# Patient Record
Sex: Female | Born: 1965 | Race: White | Hispanic: No | Smoking: Current every day smoker
Health system: Southern US, Community
[De-identification: ages and names within clinical notes are randomized; demographics above are authoritative.]

## PROBLEM LIST (undated history)

## (undated) DIAGNOSIS — M797 Fibromyalgia: Secondary | ICD-10-CM

## (undated) DIAGNOSIS — E559 Vitamin D deficiency, unspecified: Secondary | ICD-10-CM

## (undated) DIAGNOSIS — E785 Hyperlipidemia, unspecified: Secondary | ICD-10-CM

## (undated) DIAGNOSIS — F419 Anxiety disorder, unspecified: Secondary | ICD-10-CM

## (undated) DIAGNOSIS — Z720 Tobacco use: Secondary | ICD-10-CM

## (undated) DIAGNOSIS — E2839 Other primary ovarian failure: Secondary | ICD-10-CM

## (undated) DIAGNOSIS — J45909 Unspecified asthma, uncomplicated: Secondary | ICD-10-CM

## (undated) DIAGNOSIS — F1010 Alcohol abuse, uncomplicated: Secondary | ICD-10-CM

## (undated) DIAGNOSIS — R51 Headache: Secondary | ICD-10-CM

## (undated) DIAGNOSIS — I10 Essential (primary) hypertension: Secondary | ICD-10-CM

## (undated) DIAGNOSIS — R29898 Other symptoms and signs involving the musculoskeletal system: Secondary | ICD-10-CM

## (undated) DIAGNOSIS — G43909 Migraine, unspecified, not intractable, without status migrainosus: Secondary | ICD-10-CM

## (undated) DIAGNOSIS — J449 Chronic obstructive pulmonary disease, unspecified: Secondary | ICD-10-CM

## (undated) DIAGNOSIS — E876 Hypokalemia: Secondary | ICD-10-CM

## (undated) DIAGNOSIS — S161XXA Strain of muscle, fascia and tendon at neck level, initial encounter: Secondary | ICD-10-CM

## (undated) DIAGNOSIS — I639 Cerebral infarction, unspecified: Secondary | ICD-10-CM

## (undated) DIAGNOSIS — Q211 Atrial septal defect: Secondary | ICD-10-CM

## (undated) DIAGNOSIS — F1590 Other stimulant use, unspecified, uncomplicated: Secondary | ICD-10-CM

## (undated) DIAGNOSIS — D510 Vitamin B12 deficiency anemia due to intrinsic factor deficiency: Secondary | ICD-10-CM

## (undated) DIAGNOSIS — K219 Gastro-esophageal reflux disease without esophagitis: Secondary | ICD-10-CM

## (undated) DIAGNOSIS — I5042 Chronic combined systolic (congestive) and diastolic (congestive) heart failure: Secondary | ICD-10-CM

## (undated) DIAGNOSIS — E538 Deficiency of other specified B group vitamins: Secondary | ICD-10-CM

## (undated) HISTORY — DX: Unspecified asthma, uncomplicated: J45.909

## (undated) HISTORY — DX: Other primary ovarian failure: E28.39

## (undated) HISTORY — DX: Anxiety disorder, unspecified: F41.9

## (undated) HISTORY — DX: Essential (primary) hypertension: I10

## (undated) HISTORY — DX: Deficiency of other specified B group vitamins: E53.8

## (undated) HISTORY — DX: Tobacco use: Z72.0

## (undated) HISTORY — DX: Gastro-esophageal reflux disease without esophagitis: K21.9

## (undated) HISTORY — DX: Other stimulant use, unspecified, uncomplicated: F15.90

## (undated) HISTORY — DX: Fibromyalgia: M79.7

## (undated) HISTORY — DX: Vitamin D deficiency, unspecified: E55.9

## (undated) HISTORY — DX: Chronic obstructive pulmonary disease, unspecified: J44.9

## (undated) HISTORY — DX: Cerebral infarction, unspecified: I63.9

## (undated) HISTORY — DX: Alcohol abuse, uncomplicated: F10.10

## (undated) HISTORY — DX: Hyperlipidemia, unspecified: E78.5

## (undated) HISTORY — DX: Hypokalemia: E87.6

## (undated) HISTORY — DX: Migraine, unspecified, not intractable, without status migrainosus: G43.909

## (undated) HISTORY — PX: APPENDECTOMY: SHX54

## (undated) HISTORY — DX: Headache: R51

## (undated) HISTORY — DX: Vitamin B12 deficiency anemia due to intrinsic factor deficiency: D51.0

## (undated) HISTORY — PX: KNEE SURGERY: SHX244

## (undated) HISTORY — DX: Other symptoms and signs involving the musculoskeletal system: R29.898

## (undated) HISTORY — DX: Hypomagnesemia: E83.42

## (undated) HISTORY — PX: TUBAL LIGATION: SHX77

## (undated) HISTORY — DX: Strain of muscle, fascia and tendon at neck level, initial encounter: S16.1XXA

---
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
## 1997-12-24 ENCOUNTER — Other Ambulatory Visit: Admission: RE | Admit: 1997-12-24 | Discharge: 1997-12-24 | Payer: Self-pay | Admitting: Obstetrics and Gynecology

## 2001-01-05 ENCOUNTER — Other Ambulatory Visit: Admission: RE | Admit: 2001-01-05 | Discharge: 2001-01-05 | Payer: Self-pay | Admitting: Obstetrics and Gynecology

## 2001-01-17 ENCOUNTER — Other Ambulatory Visit (HOSPITAL_COMMUNITY): Admission: RE | Admit: 2001-01-17 | Discharge: 2001-01-27 | Payer: Self-pay | Admitting: Psychiatry

## 2002-04-11 ENCOUNTER — Other Ambulatory Visit: Admission: RE | Admit: 2002-04-11 | Discharge: 2002-04-11 | Payer: Self-pay | Admitting: Obstetrics and Gynecology

## 2002-08-22 ENCOUNTER — Ambulatory Visit (HOSPITAL_COMMUNITY): Admission: RE | Admit: 2002-08-22 | Discharge: 2002-08-22 | Payer: Self-pay | Admitting: Obstetrics and Gynecology

## 2004-04-04 ENCOUNTER — Other Ambulatory Visit: Admission: RE | Admit: 2004-04-04 | Discharge: 2004-04-04 | Payer: Self-pay | Admitting: Obstetrics and Gynecology

## 2011-05-29 ENCOUNTER — Encounter: Payer: Self-pay | Admitting: Gastroenterology

## 2011-06-19 ENCOUNTER — Ambulatory Visit: Payer: Self-pay | Admitting: Gastroenterology

## 2011-06-30 ENCOUNTER — Encounter: Payer: Self-pay | Admitting: Gastroenterology

## 2011-07-01 ENCOUNTER — Ambulatory Visit: Payer: Self-pay | Admitting: Gastroenterology

## 2011-08-07 ENCOUNTER — Encounter: Payer: Self-pay | Admitting: Gastroenterology

## 2011-08-07 ENCOUNTER — Ambulatory Visit (INDEPENDENT_AMBULATORY_CARE_PROVIDER_SITE_OTHER): Payer: PRIVATE HEALTH INSURANCE | Admitting: Gastroenterology

## 2011-08-07 ENCOUNTER — Other Ambulatory Visit (INDEPENDENT_AMBULATORY_CARE_PROVIDER_SITE_OTHER): Payer: PRIVATE HEALTH INSURANCE

## 2011-08-07 ENCOUNTER — Ambulatory Visit: Payer: PRIVATE HEALTH INSURANCE | Admitting: Gastroenterology

## 2011-08-07 ENCOUNTER — Telehealth: Payer: Self-pay

## 2011-08-07 VITALS — BP 110/60 | HR 68 | Ht 62.0 in | Wt 127.0 lb

## 2011-08-07 DIAGNOSIS — K219 Gastro-esophageal reflux disease without esophagitis: Secondary | ICD-10-CM

## 2011-08-07 DIAGNOSIS — K625 Hemorrhage of anus and rectum: Secondary | ICD-10-CM

## 2011-08-07 DIAGNOSIS — R110 Nausea: Secondary | ICD-10-CM

## 2011-08-07 LAB — CBC WITH DIFFERENTIAL/PLATELET
Eosinophils Absolute: 0.1 10*3/uL (ref 0.0–0.7)
Eosinophils Relative: 1.7 % (ref 0.0–5.0)
HCT: 40.5 % (ref 36.0–46.0)
Lymphs Abs: 2.5 10*3/uL (ref 0.7–4.0)
MCHC: 33.3 g/dL (ref 30.0–36.0)
MCV: 96.1 fl (ref 78.0–100.0)
Monocytes Absolute: 0.6 10*3/uL (ref 0.1–1.0)
Neutrophils Relative %: 56.8 % (ref 43.0–77.0)
Platelets: 295 10*3/uL (ref 150.0–400.0)

## 2011-08-07 LAB — COMPREHENSIVE METABOLIC PANEL
AST: 47 U/L — ABNORMAL HIGH (ref 0–37)
Albumin: 3.8 g/dL (ref 3.5–5.2)
Alkaline Phosphatase: 85 U/L (ref 39–117)
BUN: 14 mg/dL (ref 6–23)
Glucose, Bld: 95 mg/dL (ref 70–99)
Potassium: 3.8 mEq/L (ref 3.5–5.1)
Sodium: 139 mEq/L (ref 135–145)
Total Bilirubin: 0.3 mg/dL (ref 0.3–1.2)

## 2011-08-07 MED ORDER — MOVIPREP 100 G PO SOLR: 1.0000 | ORAL | Status: DC

## 2011-08-07 MED ORDER — ESOMEPRAZOLE MAGNESIUM 40 MG PO PACK: 40.0000 mg | PACK | Freq: Every day | ORAL | Status: DC

## 2011-08-07 MED ORDER — PROMETHAZINE HCL 50 MG PO TABS: 50.0000 mg | ORAL_TABLET | Freq: Two times a day (BID) | ORAL | Status: DC

## 2011-08-07 NOTE — Telephone Encounter (Signed)
Pt aware medication sent 

## 2011-08-07 NOTE — Telephone Encounter (Signed)
Ok, phenergan 25mg  pills, disp 50, take one twice daily as needed for nausea, refills 2 thanks

## 2011-08-07 NOTE — Patient Instructions (Addendum)
One of your biggest health concerns is your smoking.  This increases your risk for most cancers and serious cardiovascular diseases such as strokes, heart attacks.  You should try your best to stop.  If you need assistance, please contact your PCP or Smoking Cessation Class at [facility] [phone]) or [facility]uit-Line (1-800-QUIT-NOW). We will get records from Dr. Charm Barges colonoscopy/egd in 2006 (any pathology as wel). Samples of PPI given.  Take one pill 20-30 min prior to a meal. Take one pepcid/zantac at bedtime (this will also help with acid prolems). You will be set up for a colonoscopy ([facility] propofol for bleeding You will be set up for an upper endoscopy ([facility] propofol for GERD, nausea GERD handout. Try to avoid caffeine as much as possible. You will have labs checked today in the basement lab.  Please head down after you check out with the front desk  (cbc, cmet).  Gastroesophageal Reflux Disease, Adult Gastroesophageal reflux disease (GERD) happens when acid from your stomach flows up into the esophagus. When acid comes in contact with the esophagus, the acid causes soreness (inflammation) in the esophagus. Over time, GERD may create small holes (ulcers) in the lining of the esophagus. CAUSES   Increased body weight. This puts pressure on the stomach, making acid rise from the stomach into the esophagus.   Smoking. This increases acid production in the stomach.   Drinking alcohol. This causes decreased pressure in the lower esophageal sphincter (valve or ring of muscle between the esophagus and stomach), allowing acid from the stomach into the esophagus.   Late evening meals and a full stomach. This increases pressure and acid production in the stomach.   A malformed lower esophageal sphincter.  Sometimes, no cause is found. SYMPTOMS   Burning pain in the lower part of the mid-chest behind the breastbone and in the mid-stomach area. This may occur twice a week or more often.    Trouble swallowing.   Sore throat.   Dry cough.   Asthma-like symptoms including chest tightness, shortness of breath, or wheezing.  DIAGNOSIS  Your caregiver may be able to diagnose GERD based on your symptoms. In some cases, X-rays and other tests may be done to check for complications or to check the condition of your stomach and esophagus. TREATMENT  Your caregiver may recommend over-the-counter or prescription medicines to help decrease acid production. Ask your caregiver before starting or adding any new medicines.  HOME CARE INSTRUCTIONS   Change the factors that you can control. Ask your caregiver for guidance concerning weight loss, quitting smoking, and alcohol consumption.   Avoid foods and drinks that make your symptoms worse, such as:   Caffeine or alcoholic drinks.   Chocolate.   Peppermint or mint flavorings.   Garlic and onions.   Spicy foods.   Citrus fruits, such as oranges, lemons, or limes.   Tomato-based foods such as sauce, chili, salsa, and pizza.   Fried and fatty foods.   Avoid lying down for the 3 hours prior to your bedtime or prior to taking a nap.   Eat small, frequent meals instead of large meals.   Wear loose-fitting clothing. Do not wear anything tight around your waist that causes pressure on your stomach.   Raise the head of your bed 6 to 8 inches with wood blocks to help you sleep. Extra pillows will not help.   Only take over-the-counter or prescription medicines for pain, discomfort, or fever as directed by your caregiver.   Do  not take aspirin, ibuprofen, or other nonsteroidal anti-inflammatory drugs (NSAIDs).  SEEK IMMEDIATE MEDICAL CARE IF:   You have pain in your arms, neck, jaw, teeth, or back.   Your pain increases or changes in intensity or duration.   You develop nausea, vomiting, or sweating (diaphoresis).   You develop shortness of breath, or you faint.   Your vomit is green, yellow, black, or looks like coffee  grounds or blood.   Your stool is red, bloody, or black.  These symptoms could be signs of other problems, such as heart disease, gastric bleeding, or esophageal bleeding. MAKE SURE YOU:   Understand these instructions.   Will watch your condition.   Will get help right away if you are not doing well or get worse.  Document Released: 11/12/2004 Document Revised: 01/22/2011 Document Reviewed: 08/22/2010 [facility] Patient Information 2012 [place], [place].

## 2011-08-07 NOTE — Progress Notes (Signed)
HPI: This is a   pleasant 46 year old woman whom I am meeting for the first time today.he has constant nausea, she has abd pains.  Mid abdomen.  crampy constant pains.  Moving her bowels does not help. Predominantly loose bowel but definitely alternates.   She says she's had colitis 3 times in past year.  Presented with bleeding overt, she had urgency.  This occurred in January and then also twice since then.  She had colonoscoy in 2006 (Dr. Charm Barges), she believes polyps were removed.  He also performed EGD, she does not recall what was found.  Has lost 22 pounds in past 5-6 month.  Not trying to lose weight.  Takes goodys rarely.  Some recently for headaches.  Takes dramamine for nausea  She used to drink a lot, got 2 dui's.  Has had no etoh in about 2 months.  Drinks a lot of soda, drinks coke for pyrosis.  Has been on PPI for years.  Most recently omeprazole, usually takes it before she sleeps in AM (night shifts).  Has a pretty poor appetite.   Review of systems: Pertinent positive and negative review of systems were noted in the above HPI section. Complete review of systems was performed and was otherwise normal.    Past Medical History  Diagnosis Date  . Alcohol abuse   . Anxiety   . Asthma   . COPD (chronic obstructive pulmonary disease)   . Fibromyalgia   . Colitis, infectious     Past Surgical History  Procedure Date  . Tubal ligation     Current Outpatient Prescriptions  Medication Sig Dispense Refill  . alprazolam (XANAX) 2 MG tablet Take 2 mg by mouth at bedtime as needed.      Marland Kitchen omeprazole (PRILOSEC) 20 MG capsule Take 20 mg by mouth daily.        Allergies as of 08/07/2011 - Review Complete 08/07/2011  Allergen Reaction Noted  . Codeine  08/07/2011  . Sulfa antibiotics  08/07/2011    Family History  Problem Relation Age of Onset  . Colitis Paternal Aunt   . Ovarian cancer Maternal Grandmother     History   Social History  . Marital Status:  Divorced    Spouse Name: N/A    Number of Children: 2  . Years of Education: N/A   Occupational History  . Textiles    Social History Main Topics  . Smoking status: Current Everyday Smoker  . Smokeless tobacco: Never Used  . Alcohol Use: No  . Drug Use: No  . Sexually Active: Not on file   Other Topics Concern  . Not on file   Social History Narrative  . No narrative on file       Physical Exam: BP 110/60  Pulse 68  Ht 5\' 2"  (1.575 m)  Wt 127 lb (57.607 kg)  BMI 23.23 kg/m2 Constitutional: generally well-appearing Psychiatric: alert and oriented x3 Eyes: extraocular movements intact Mouth: oral pharynx moist, no lesions Neck: supple no lymphadenopathy Cardiovascular: heart regular rate and rhythm Lungs: clear to auscultation bilaterally Abdomen: soft, nontender, nondistended, no obvious ascites, no peritoneal signs, normal bowel sounds Extremities: no lower extremity edema bilaterally Skin: no lesions on visible extremities    Assessment and plan: 46 y.o. female with GERD, intermittent episodes of rectal bleeding, alternating bowel habits, nausea dyspepsia  she has myriad upper and lower GI symptoms.  We will get records from her previous gastroenterologist including colonoscopy, upper endoscopy reports from 2007. I think she  will still need repeat upper and lower endoscopy now given her intermittent rectal bleeding, her constant nausea. She use to be an alcoholic but has cut back to almost 0 alcohol. She does not take proton pump inhibitor at the current time in relation to meals and I had changed that for her. I'm also adding H2 blocker at night. He drinks a fair amount of caffeine, sometimes she uses to treat heartburn which is exactly the wrong thing to do since caffeine can worsen heartburn. She'll get a set of labs including CBC, complete metabolic profile and celiac sprue testing.

## 2011-08-07 NOTE — Telephone Encounter (Signed)
Pt would like phenergan for nausea.  Can we send?

## 2011-08-10 ENCOUNTER — Encounter: Payer: Self-pay | Admitting: Gastroenterology

## 2011-08-10 LAB — CELIAC PANEL 10
Endomysial Screen: NEGATIVE
Gliadin IgG: 11.2 U/mL (ref <20)
IgA: 204 mg/dL (ref 69–380)
Tissue Transglutaminase Ab, IgA: 4.6 U/mL (ref <20)

## 2011-08-11 ENCOUNTER — Encounter: Payer: Self-pay | Admitting: Gastroenterology

## 2011-08-12 ENCOUNTER — Other Ambulatory Visit: Payer: Self-pay

## 2011-08-25 ENCOUNTER — Telehealth: Payer: Self-pay | Admitting: Gastroenterology

## 2011-08-25 MED ORDER — ESOMEPRAZOLE MAGNESIUM 40 MG PO PACK: 40.0000 mg | PACK | Freq: Every day | ORAL | Status: DC

## 2011-08-25 NOTE — Telephone Encounter (Signed)
nexium prescription sent to the pharmacy

## 2011-08-27 ENCOUNTER — Other Ambulatory Visit: Payer: Self-pay | Admitting: Gastroenterology

## 2011-08-27 MED ORDER — ESOMEPRAZOLE MAGNESIUM 40 MG PO PACK: 40.0000 mg | PACK | Freq: Every day | ORAL | Status: DC

## 2011-08-27 NOTE — Telephone Encounter (Signed)
Pt aware prescription sent

## 2011-08-28 ENCOUNTER — Other Ambulatory Visit: Payer: Self-pay

## 2011-08-28 MED ORDER — ESOMEPRAZOLE MAGNESIUM 40 MG PO PACK: 40.0000 mg | PACK | Freq: Every day | ORAL | Status: DC

## 2011-08-31 ENCOUNTER — Telehealth: Payer: Self-pay | Admitting: Gastroenterology

## 2011-09-01 NOTE — Telephone Encounter (Signed)
Prior Berkley Harvey form has been sent to [facility].

## 2011-09-11 ENCOUNTER — Encounter: Payer: PRIVATE HEALTH INSURANCE | Admitting: Gastroenterology

## 2011-09-15 DIAGNOSIS — F410 Panic disorder [episodic paroxysmal anxiety] without agoraphobia: Secondary | ICD-10-CM

## 2011-09-15 DIAGNOSIS — F329 Major depressive disorder, single episode, unspecified: Secondary | ICD-10-CM | POA: Insufficient documentation

## 2011-09-15 HISTORY — DX: Panic disorder (episodic paroxysmal anxiety): F41.0

## 2011-09-15 HISTORY — DX: Major depressive disorder, single episode, unspecified: F32.9

## 2011-10-01 ENCOUNTER — Telehealth: Payer: Self-pay | Admitting: Gastroenterology

## 2011-10-01 NOTE — Telephone Encounter (Signed)
Patient advised that Mary Franco will return her call tomorrow to find a propofol day.

## 2011-10-02 NOTE — Telephone Encounter (Signed)
Pt is checking her schedule at work and will call back to reschedule

## 2011-10-06 ENCOUNTER — Encounter: Payer: PRIVATE HEALTH INSURANCE | Admitting: Gastroenterology

## 2011-11-12 ENCOUNTER — Telehealth: Payer: Self-pay

## 2011-11-12 NOTE — Telephone Encounter (Signed)
Message copied by Donata Duff on Thu Nov 12, 2011  8:25 AM ------      Message from: Donata Duff      Created: Wed Aug 12, 2011  9:50 AM       Pt to get labs

## 2011-11-12 NOTE — Telephone Encounter (Signed)
Pt has been notified to have labs  

## 2012-02-11 ENCOUNTER — Other Ambulatory Visit (INDEPENDENT_AMBULATORY_CARE_PROVIDER_SITE_OTHER): Payer: PRIVATE HEALTH INSURANCE

## 2012-02-11 ENCOUNTER — Encounter: Payer: Self-pay | Admitting: Gastroenterology

## 2012-02-11 ENCOUNTER — Ambulatory Visit (INDEPENDENT_AMBULATORY_CARE_PROVIDER_SITE_OTHER): Payer: PRIVATE HEALTH INSURANCE | Admitting: Gastroenterology

## 2012-02-11 ENCOUNTER — Other Ambulatory Visit: Payer: Self-pay | Admitting: Gastroenterology

## 2012-02-11 VITALS — BP 108/72 | HR 88 | Ht 62.0 in | Wt 130.0 lb

## 2012-02-11 DIAGNOSIS — R110 Nausea: Secondary | ICD-10-CM

## 2012-02-11 DIAGNOSIS — R197 Diarrhea, unspecified: Secondary | ICD-10-CM

## 2012-02-11 LAB — CBC WITH DIFFERENTIAL/PLATELET
Basophils Absolute: 0 10*3/uL (ref 0.0–0.1)
Eosinophils Absolute: 0.1 10*3/uL (ref 0.0–0.7)
Lymphocytes Relative: 29.4 % (ref 12.0–46.0)
MCHC: 34.2 g/dL (ref 30.0–36.0)
Neutrophils Relative %: 63.9 % (ref 43.0–77.0)
Platelets: 286 10*3/uL (ref 150.0–400.0)
RBC: 4.09 Mil/uL (ref 3.87–5.11)
RDW: 12.9 % (ref 11.5–14.6)

## 2012-02-11 LAB — HEPATIC FUNCTION PANEL
ALT: 23 U/L (ref 0–35)
AST: 26 U/L (ref 0–37)
Bilirubin, Direct: 0.1 mg/dL (ref 0.0–0.3)
Total Bilirubin: 0.4 mg/dL (ref 0.3–1.2)
Total Protein: 6.8 g/dL (ref 6.0–8.3)

## 2012-02-11 MED ORDER — PEG-KCL-NACL-NASULF-NA ASC-C 100 G PO SOLR: 1.0000 | Freq: Once | ORAL | Status: DC

## 2012-02-11 NOTE — Telephone Encounter (Signed)
Dr Christella Hartigan can we refill?

## 2012-02-11 NOTE — Patient Instructions (Addendum)
One of your biggest health concerns is your smoking.  This increases your risk for most cancers and serious cardiovascular diseases such as strokes, heart attacks.  You should try your best to stop.  If you need assistance, please contact your PCP or Smoking Cessation Class at [facility] [phone]) or [facility]Line (1-800-QUIT-NOW). We will again try to get copies of your previous colonoscopy/EGD from Dr. Charm Barges in [place].  Also any associated pathology reports. Your previous liver tests were elevated, need to repeat LFTs (labs) today. Also cbc for fatigue. You will be set up for a colonoscopy, upper endoscopy (for loose stools, nausea, history of rectal bleeding), [facility] or [facility] MAC+. Goody powders can cause serious damage to your gi tract, should try to avoid as best as possible.

## 2012-02-11 NOTE — Progress Notes (Signed)
Review of pertinent gastrointestinal problems: 1. "constant nausea, intermittent rectal bleeding" seen by Dr. Christella Hartigan as new patient 07/2011, recommended she have colonoscopy/EGD but she cancelled those appts.  Labs 07/2011 cbc, celiac panel normal; AST slightly elevated, recommended to have repeat lfts but she never did.  She had colonoscoy in 2006 (Dr. Charm Barges), she believes polyps were removed. He also performed EGD, she does not recall what was found. We tried 2013 to get those records but they were never sent. 2. Former etoh abuser with 2 dui's   HPI: This is a  pleasant 46 year old woman whom I last saw about 6 months ago. She has not followed up with her colonoscopy or upper endoscopy which I recommended. She is also not follow up with the repeat liver tests check that I recommended.  Weight is up 3 pounds since last visit.  Still having a lot of abdominal pain. Eating has not relation.  She works night, drinks 2 cups of coffee.    She takes about to be powder is a day every day.  She is still bothered by nausea chronically. She has not had any more rectal bleeding but is bothered by loose stools.  Past Medical History  Diagnosis Date  . Migraines   . Alcohol abuse   . Anxiety   . Asthma   . COPD (chronic obstructive pulmonary disease)   . Fibromyalgia     Past Surgical History  Procedure Date  . Tubal ligation     Current Outpatient Prescriptions  Medication Sig Dispense Refill  . alprazolam (XANAX) 2 MG tablet Take 2 mg by mouth at bedtime as needed.      Marland Kitchen esomeprazole (NEXIUM) 40 MG packet Take 40 mg by mouth daily before breakfast.  30 each  12  . promethazine (PHENERGAN) 50 MG tablet Take 1 tablet (50 mg total) by mouth 2 (two) times daily.  50 tablet  2    Allergies as of 02/11/2012 - Review Complete 02/11/2012  Allergen Reaction Noted  . Codeine  08/07/2011  . Sulfa antibiotics  08/07/2011    Family History  Problem Relation Age of Onset  . Colitis Paternal  Aunt   . Ovarian cancer Maternal Grandmother     History   Social History  . Marital Status: Divorced    Spouse Name: N/A    Number of Children: 2  . Years of Education: N/A   Occupational History  . Textiles    Social History Main Topics  . Smoking status: Current Every Day Smoker  . Smokeless tobacco: Never Used  . Alcohol Use: No  . Drug Use: No  . Sexually Active:    Other Topics Concern  . Not on file   Social History Narrative   ** Merged History Encounter **       Physical Exam: BP 108/72  Pulse 88  Ht 5\' 2"  (1.575 m)  Wt 130 lb (58.968 kg)  BMI 23.78 kg/m2 Constitutional: generally well-appearing Psychiatric: alert and oriented x3 Abdomen: soft, nontender, nondistended, no obvious ascites, no peritoneal signs, normal bowel sounds     Assessment and plan: 46 y.o. female with  history of minor rectal bleeding, history of some type of colitis which is unclear to me. Chronic nausea, chronic loose stools.  We will again try to get records from her previous gastroenterologist. I again recommended colonoscopy and upper endoscopy which she again extensive. Hopefully she will not cancel this time. We have been trying to track her down for repeat liver  tests and we will do those for her today. Tells me she has been very fatigued and so I will add a CBC as well.

## 2012-02-12 MED ORDER — PROMETHAZINE HCL 25 MG PO TABS: 25.0000 mg | ORAL_TABLET | Freq: Three times a day (TID) | ORAL | Status: DC | PRN

## 2012-02-12 NOTE — Telephone Encounter (Signed)
Please call the patient. The labs were all normal. Should continue with the suggestions outlined at recent visit.  Pt has been notified and med sent to the pharmacy

## 2012-02-12 NOTE — Telephone Encounter (Signed)
Yes, phenergan 25mg  pills, take one pill tid prn, disp 60, 3 refills  Thanks

## 2012-02-15 ENCOUNTER — Encounter: Payer: Self-pay | Admitting: Gastroenterology

## 2012-03-18 ENCOUNTER — Ambulatory Visit (AMBULATORY_SURGERY_CENTER): Payer: PRIVATE HEALTH INSURANCE | Admitting: Gastroenterology

## 2012-03-18 ENCOUNTER — Encounter: Payer: Self-pay | Admitting: Gastroenterology

## 2012-03-18 VITALS — BP 135/77 | HR 86 | Temp 99.0°F | Resp 20 | Ht 62.0 in | Wt 130.0 lb

## 2012-03-18 DIAGNOSIS — D126 Benign neoplasm of colon, unspecified: Secondary | ICD-10-CM

## 2012-03-18 DIAGNOSIS — K2990 Gastroduodenitis, unspecified, without bleeding: Secondary | ICD-10-CM

## 2012-03-18 DIAGNOSIS — R110 Nausea: Secondary | ICD-10-CM

## 2012-03-18 DIAGNOSIS — K2970 Gastritis, unspecified, without bleeding: Secondary | ICD-10-CM

## 2012-03-18 DIAGNOSIS — R197 Diarrhea, unspecified: Secondary | ICD-10-CM

## 2012-03-18 DIAGNOSIS — D131 Benign neoplasm of stomach: Secondary | ICD-10-CM

## 2012-03-18 MED ORDER — SODIUM CHLORIDE 0.9 % IV SOLN: 500.0000 mL | INTRAVENOUS | Status: DC

## 2012-03-18 NOTE — Op Note (Signed)
[facility] [address] [place], [address]   ENDOSCOPY PROCEDURE REPORT  PATIENT: Mary Franco, Mary Franco  MR#: [id number] BIRTHDATE: Nov 26, 1965 , 46  yrs. old GENDER: Female ENDOSCOPIST: Rachael Fee, MD PROCEDURE DATE:  03/18/2012 PROCEDURE:  EGD w/ biopsy ASA CLASS:     Class III INDICATIONS:  chronic nausea. MEDICATIONS: propofol (Diprivan) 100mg  IV, There was residual sedation effect present from prior procedure, and MAC sedation, administered by CRNA TOPICAL ANESTHETIC: none  DESCRIPTION OF PROCEDURE: After the risks benefits and alternatives of the procedure were thoroughly explained, informed consent was obtained.  The LB [id number]-H180 [id number] endoscope was introduced through the mouth and advanced to the second portion of the duodenum. Without limitations.  The instrument was slowly withdrawn as the mucosa was fully examined.   There was mild distal gastritis.  Biopsies were taken and sent to pathology.  The examination was otherwise normal.  Retroflexed views revealed no abnormalities.     The scope was then withdrawn from the patient and the procedure completed. COMPLICATIONS: There were no complications.  ENDOSCOPIC IMPRESSION: There was mild distal gastritis, biopsied to check for H. pylori The examination was otherwise normal.  RECOMMENDATIONS: Please continue phenergan PRN.  Continue nexium once daily (this is best taken 20-30 min prior to a meal.  You should avoid NSAIDs as best that you can as these can contribute to your stomach inflamation.  If the biopsies show H. pylori, you will be started on appropriate antibiotics.   eSigned:  Rachael Fee, MD 03/18/2012 1:56 PM

## 2012-03-18 NOTE — Progress Notes (Signed)
In pacu, vss, bbs=clear, report to pacu rn.

## 2012-03-18 NOTE — Op Note (Signed)
[facility] [address] [place], [address]   COLONOSCOPY PROCEDURE REPORT  PATIENT: Mary, Franco  MR#: [id number] BIRTHDATE: 1965-06-17 , 46  yrs. old GENDER: Female ENDOSCOPIST: Rachael Fee, MD REFERRED BY: PROCEDURE DATE:  03/18/2012 PROCEDURE:   Colonoscopy with biopsy ASA CLASS:   Class III INDICATIONS:diarrhea, chronic. MEDICATIONS: propofol (Diprivan) 200mg  IV and MAC sedation, administered by CRNA  DESCRIPTION OF PROCEDURE:   After the risks benefits and alternatives of the procedure were thoroughly explained, informed consent was obtained.  A digital rectal exam revealed no abnormalities of the rectum.   The LB [id number]  endoscope was introduced through the anus and advanced to the terminal ileum which was intubated for a short distance. No adverse events experienced.   The quality of the prep was good, using MoviPrep The instrument was then slowly withdrawn as the colon was fully examined.  COLON FINDINGS: Normal terminal ileum.  Normal colon, randomly biopsied and sent to pathology.  Retroflexed views revealed no abnormalities. The time to cecum=4 minutes 02 seconds.  Withdrawal time=5 minutes 38 seconds.  The scope was withdrawn and the procedure completed. COMPLICATIONS: There were no complications.  ENDOSCOPIC IMPRESSION: Normal terminal ileum. Normal colon, randomly biopsied and sent to pathology to check for microscopic colitis  RECOMMENDATIONS: Await biopsy results For now please start one imodium every morning shortly after waking up.  eSigned:  Rachael Fee, MD 03/18/2012 1:47 PM

## 2012-03-18 NOTE — Progress Notes (Signed)
Called to room to assist during endoscopic procedure.  Patient ID and intended procedure confirmed with present staff. Received instructions for my participation in the procedure from the performing physician.  

## 2012-03-18 NOTE — Progress Notes (Signed)
Patient did not experience any of the following events: a burn prior to discharge; a fall within the facility; wrong site/side/patient/procedure/implant event; or a hospital transfer or hospital admission upon discharge from the facility. (G8907) Patient did not have preoperative order for IV antibiotic SSI prophylaxis. (G8918)  

## 2012-03-18 NOTE — Patient Instructions (Addendum)
One of your biggest health concerns is your smoking.  This increases your risk for most cancers and serious cardiovascular diseases such as strokes, heart attacks.  You should try your best to stop.  If you need assistance, please contact your PCP or Smoking Cessation Class at [facility] [phone]) or [place] (1-800-QUIT-NOW).  YOU HAD AN ENDOSCOPIC PROCEDURE TODAY AT [facility]: Refer to the procedure report that was given to you for any specific questions about what was found during the examination.  If the procedure report does not answer your questions, please call your gastroenterologist to clarify.  If you requested that your care partner not be given the details of your procedure findings, then the procedure report has been included in a sealed envelope for you to review at your convenience later.  YOU SHOULD EXPECT: Some feelings of bloating in the abdomen. Passage of more gas than usual.  Walking can help get rid of the air that was put into your GI tract during the procedure and reduce the bloating. If you had a lower endoscopy (such as a colonoscopy or flexible sigmoidoscopy) you may notice spotting of blood in your stool or on the toilet paper. If you underwent a bowel prep for your procedure, then you may not have a normal bowel movement for a few days.  DIET: Your first meal following the procedure should be a light meal and then it is ok to progress to your normal diet.  A half-sandwich or bowl of soup is an example of a good first meal.  Heavy or fried foods are harder to digest and may make you feel nauseous or bloated.  Likewise meals heavy in dairy and vegetables can cause extra gas to form and this can also increase the bloating.  Drink plenty of fluids but you should avoid alcoholic beverages for 24 hours.  ACTIVITY: Your care partner should take you home directly after the procedure.  You should plan to take it easy, moving slowly for the rest of  the day.  You can resume normal activity the day after the procedure however you should NOT DRIVE or use heavy machinery for 24 hours (because of the sedation medicines used during the test).    SYMPTOMS TO REPORT IMMEDIATELY: A gastroenterologist can be reached at any hour.  During normal business hours, 8:30 AM to 5:00 PM Monday through Friday, call [phone].  After hours and on weekends, please call the GI answering service at [phone] who will take a message and have the physician on call contact you.   Following lower endoscopy (colonoscopy or flexible sigmoidoscopy):  Excessive amounts of blood in the stool  Significant tenderness or worsening of abdominal pains  Swelling of the abdomen that is new, acute  Fever of 100F or higher  Following upper endoscopy (EGD)  Vomiting of blood or coffee ground material  New chest pain or pain under the shoulder blades  Painful or persistently difficult swallowing  New shortness of breath  Fever of 100F or higher  Black, tarry-looking stools  FOLLOW UP: If any biopsies were taken you will be contacted by phone or by letter within the next 1-3 weeks.  Call your gastroenterologist if you have not heard about the biopsies in 3 weeks.  Our staff will call the home number listed on your records the next business day following your procedure to check on you and address any questions or concerns that you may have at that time regarding the  information given to you following your procedure. This is a courtesy call and so if there is no answer at the home number and we have not heard from you through the emergency physician on call, we will assume that you have returned to your regular daily activities without incident.  SIGNATURES/CONFIDENTIALITY: You and/or your care partner have signed paperwork which will be entered into your electronic medical record.  These signatures attest to the fact that that the information above on your After Visit  Summary has been reviewed and is understood.  Full responsibility of the confidentiality of this discharge information lies with you and/or your care-partner.   Gastritis-handout given  Continue phenergan as needed, and nexium, avoid NSAIDS  Wait pathology

## 2012-03-21 ENCOUNTER — Telehealth: Payer: Self-pay | Admitting: *Deleted

## 2012-03-21 NOTE — Telephone Encounter (Signed)
No answer, message left for the patient. 

## 2012-03-24 ENCOUNTER — Encounter: Payer: Self-pay | Admitting: Gastroenterology

## 2012-04-07 ENCOUNTER — Telehealth: Payer: Self-pay | Admitting: Gastroenterology

## 2012-04-07 NOTE — Telephone Encounter (Signed)
i agree, should take PPI at correct timing in relation to meals.  This can make signficant difference in some people.

## 2012-04-07 NOTE — Telephone Encounter (Signed)
Pt complains of nausea, and lower abd pain.  Normal bowel movements.  Pt is taking Nexium daily she does not take it as directed.  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  She had endo colon Jan 2014.  Phenergan does not help with the nausea.  Pt was instructed to take the Nexium 20-30 minutes before a meal.  Pt advised I will forward to Dr Christella Hartigan for further recommendations

## 2012-04-08 NOTE — Telephone Encounter (Signed)
Pt has been notified.

## 2012-05-11 ENCOUNTER — Other Ambulatory Visit: Payer: Self-pay

## 2012-05-11 MED ORDER — ESOMEPRAZOLE MAGNESIUM 40 MG PO CPDR: 40.0000 mg | DELAYED_RELEASE_CAPSULE | Freq: Every day | ORAL | Status: DC

## 2013-01-26 ENCOUNTER — Other Ambulatory Visit: Payer: Self-pay | Admitting: Obstetrics and Gynecology

## 2014-04-06 ENCOUNTER — Other Ambulatory Visit: Payer: Self-pay | Admitting: Obstetrics and Gynecology

## 2014-04-09 LAB — CYTOLOGY - PAP

## 2014-04-16 ENCOUNTER — Ambulatory Visit (INDEPENDENT_AMBULATORY_CARE_PROVIDER_SITE_OTHER): Payer: 59 | Admitting: Neurology

## 2014-04-16 ENCOUNTER — Encounter: Payer: Self-pay | Admitting: Neurology

## 2014-04-16 VITALS — BP 117/76 | HR 79 | Ht 62.0 in | Wt 122.2 lb

## 2014-04-16 DIAGNOSIS — G4486 Cervicogenic headache: Secondary | ICD-10-CM

## 2014-04-16 DIAGNOSIS — R51 Headache: Secondary | ICD-10-CM | POA: Diagnosis not present

## 2014-04-16 DIAGNOSIS — S161XXA Strain of muscle, fascia and tendon at neck level, initial encounter: Secondary | ICD-10-CM | POA: Diagnosis not present

## 2014-04-16 HISTORY — DX: Cervicogenic headache: G44.86

## 2014-04-16 HISTORY — DX: Strain of muscle, fascia and tendon at neck level, initial encounter: S16.1XXA

## 2014-04-16 MED ORDER — METHOCARBAMOL 500 MG PO TABS: 500.0000 mg | ORAL_TABLET | Freq: Three times a day (TID) | ORAL | Status: DC

## 2014-04-16 NOTE — Patient Instructions (Signed)

## 2014-04-16 NOTE — Progress Notes (Signed)
Reason for visit: Neck and shoulder pain  [place] C Brocker is a 49 y.o. female  History of present illness:  Ms. Stephen is a 30 year old right-handed white female with a several year history of neck pain that will undergo associated with some left shoulder pain. The problem has been constant over the last 3 weeks and more severe. The patient has pain into her left shoulder, with numbness and tingly sensations going down the left arm to the hand. She also describes a numbness down the left leg to the foot. She has shoulder pain, with significant pain in the shoulder joint with abduction of the left arm. She denies any numbness on the face. She has pain going up the back of the head, and around the head from there. She also reports some chronic sinus issues over the last several months. The patient is having some cramping in the left arm and shoulder area. She reports fatigue, and anxiety. She denies problems with balance or difficulty controlling the bowels or the bladder. She does report some urinary frequency. She is sent to this office for further evaluation.  Past Medical History  Diagnosis Date  . Migraines   . Alcohol abuse   . Anxiety   . Asthma   . COPD (chronic obstructive pulmonary disease)   . Fibromyalgia   . Cervical strain 04/16/2014  . Cervicogenic headache 04/16/2014    Past Surgical History  Procedure Laterality Date  . Tubal ligation    . Appendectomy    . Knee surgery Left     Family History  Problem Relation Age of Onset  . Colitis Paternal Aunt   . Ovarian cancer Maternal Grandmother   . Cancer Mother     Social history:  reports that she has been smoking.  She has never used smokeless tobacco. She reports that she drinks alcohol. She reports that she does not use illicit drugs.  Medications:  Prior to Admission medications   Medication Sig Start Date End Date Taking? Authorizing Provider  alprazolam Duanne Moron) 2 MG tablet Take 2 mg by mouth at bedtime as  needed.   Yes Historical Provider, MD  PROAIR HFA 108 (90 BASE) MCG/ACT inhaler as needed. 01/31/14  Yes Historical Provider, MD  promethazine (PHENERGAN) 25 MG tablet Take 1 tablet (25 mg total) by mouth every 8 (eight) hours as needed for nausea. 02/12/12  Yes Milus Banister, MD  methocarbamol (ROBAXIN) 500 MG tablet Take 1 tablet (500 mg total) by mouth 3 (three) times daily. 04/16/14   Kathrynn Ducking, MD  promethazine (PHENERGAN) 50 MG tablet Take 1 tablet (50 mg total) by mouth 2 (two) times daily. 08/07/11 08/14/11  Milus Banister, MD      Allergies  Allergen Reactions  . Codeine   . Sulfa Antibiotics     ROS:  Out of a complete 14 system review of symptoms, the patient complains only of the following symptoms, and all other reviewed systems are negative.  Fatigue Cough Easy bruising Feeling hot, cold, increased thirst Joint pain, achy muscles Allergies, runny nose, skin sensitivity, frequent infections Memory loss, headache, numbness, weakness Depression, anxiety, not enough sleep, decreased energy Insomnia, sleepiness, restless legs, muscle cramps  Blood pressure 117/76, pulse 79, height 5\' 2"  (1.575 m), weight 122 lb 3.2 oz (55.43 kg).  Physical Exam  General: The patient is alert and cooperative at the time of the examination.  Eyes: Pupils are equal, round, and reactive to light. Discs are flat bilaterally.  Neck: The neck is supple, no carotid bruits are noted.  Respiratory: The respiratory examination is clear.  Cardiovascular: The cardiovascular examination reveals a regular rate and rhythm, no obvious murmurs or rubs are noted.  Neuromuscular: The left shoulder is elevated relative to the right. The patient has fair range of movement of the cervical spine. The patient has pain with internal and external rotation of the left shoulder, pain with abduction of the left arm past 90. No evidence of scoliosis is noted of the cervical or thoracic spine.  Skin:  Extremities are without significant edema.  Neurologic Exam  Mental status: The patient is alert and oriented x 3 at the time of the examination. The patient has apparent normal recent and remote memory, with an apparently normal attention span and concentration ability.  Cranial nerves: Facial symmetry is present. There is good sensation of the face to pinprick and soft touch bilaterally. The strength of the facial muscles and the muscles to head turning and shoulder shrug are normal bilaterally. Speech is well enunciated, no aphasia or dysarthria is noted. Extraocular movements are full. Visual fields are full. The tongue is midline, and the patient has symmetric elevation of the soft palate. No obvious hearing deficits are noted.  Motor: The motor testing reveals 5 over 5 strength of all 4 extremities. Good symmetric motor tone is noted throughout.  Sensory: Sensory testing is intact to pinprick, soft touch, vibration sensation, and position sense on all 4 extremities. No evidence of extinction is noted.  Coordination: Cerebellar testing reveals good finger-nose-finger and heel-to-shin bilaterally.  Gait and station: Gait is normal. Tandem gait is normal. Romberg is negative. No drift is seen.  Reflexes: Deep tendon reflexes are symmetric and normal bilaterally. Toes are downgoing bilaterally.   Assessment/Plan:  1. Cervical strain  2. Cervicogenic headache  3. 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 The patient may have intrinsic shoulder joint disease as well. The patient will be set up for MRI evaluation of the cervical spine, she will be placed on Robaxin, and she will continue taking Aleve on a regular basis. The patient will be sent for physical therapy on the neck and shoulder. In the future, MRI evaluation of the shoulder joint may be done. Will follow-up in  3-4 months.  Jill Alexanders MD 04/16/2014 8:00 PM  [facility] [address] [place], [place] [address]  Phone [phone] Fax [phone]

## 2014-04-23 ENCOUNTER — Telehealth: Payer: Self-pay | Admitting: Neurology

## 2014-04-23 MED ORDER — DICLOFENAC SODIUM 75 MG PO TBEC: 75.0000 mg | DELAYED_RELEASE_TABLET | Freq: Two times a day (BID) | ORAL | Status: DC

## 2014-04-23 NOTE — Telephone Encounter (Signed)
MRI denied case #[id number] please do peer to peer review [phone]

## 2014-04-23 NOTE — Telephone Encounter (Signed)
The patient is not gaining benefit with the Robaxin and Aleve. I will have her stop the Aleve, switch to diclofenac. MRI of the cervical spine was denied, I will need to do a peer Review for this.

## 2014-04-23 NOTE — Telephone Encounter (Signed)
Pt is calling stating she is taking Aleve and methocarbamol (ROBAXIN) 500 MG tablet and it is not helping with her pain. She wants to know if she can get something else for pain she uses [facility].  Please call and advise.

## 2014-04-24 NOTE — Telephone Encounter (Signed)
I called for peer to peer review. The patient is required to have at least 6 weeks of medical therapy prior to considering MRI evaluation. The patient will be followed up in the office, we will reorder the MRI of the cervical spine at that time if symptoms persist. I called the patient regarding this.

## 2014-05-10 ENCOUNTER — Ambulatory Visit: Payer: 59 | Attending: Neurology

## 2014-05-10 DIAGNOSIS — R293 Abnormal posture: Secondary | ICD-10-CM | POA: Insufficient documentation

## 2014-05-10 DIAGNOSIS — M25512 Pain in left shoulder: Secondary | ICD-10-CM | POA: Diagnosis not present

## 2014-05-10 NOTE — Patient Instructions (Signed)
Scapular Retraction (Standing)   With arms at sides, pinch shoulder blades together.  Hold 5 seconds Repeat __10__ times per set. Do __1__ sets per session. Do __2-3__ sessions per day.  http://orth.exer.us/945   Copyright  VHI. All rights reserved.  Levator Scapula Stretch, Sitting   Sit, one hand tucked under hip on side to be stretched, other hand over top of head. Turn head toward other side and look down. Use hand on head to gently stretch neck in that position. Hold _20__ seconds. Repeat _3__ times per session. Do _2__ sessions per day.  Copyright  VHI. All rights reserved.  CHEST: Doorway, Unilateral - Standing   Standing in doorway, place one hand on wall with elbow bent at shoulder height. Lean forward. Hold _15-20__ seconds. _2-3__ reps per set, __2_ sets per day, __7_ days per week  Copyright  VHI. All rights reserved.

## 2014-05-10 NOTE — Therapy (Signed)
[facility] [address] [place], [place], [address] Phone: [phone]   Fax:  [phone]  Physical Therapy Evaluation  Patient Details  Name: Mary Franco MRN: [id number] Date of Birth: Dec 25, 1965 Referring Provider:  Kathrynn Ducking, MD  Encounter Date: 05/10/2014      PT End of Session - 05/10/14 1738    Visit Number 1   Number of Visits 6   Date for PT Re-Evaluation 06/22/14   PT Start Time 0845   PT Stop Time 0930   PT Time Calculation (min) 45 min   Activity Tolerance Patient tolerated treatment well   Behavior During Therapy [facility] for tasks assessed/performed      Past Medical History  Diagnosis Date  . Migraines   . Alcohol abuse   . Anxiety   . Asthma   . COPD (chronic obstructive pulmonary disease)   . Fibromyalgia   . Cervical strain 04/16/2014  . Cervicogenic headache 04/16/2014    Past Surgical History  Procedure Laterality Date  . Tubal ligation    . Appendectomy    . Knee surgery Left     There were no vitals filed for this visit.  Visit Diagnosis:  Left shoulder pain - Plan: PT plan of care cert/re-cert  Posture abnormality - Plan: PT plan of care cert/re-cert      Subjective Assessment - 05/10/14 0850    Symptoms Patient reports having neck and shoulder pain worse for about 4 weeks.  Also fell through a porch 2005 or 2006 and bruised left hip and feels pain down into that leg as well.  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  Feels    Currently in Pain? Yes   Pain Location Shoulder   Pain Orientation Left   Pain Descriptors / Indicators Constant;Aching;Radiating;Sore   Pain Type Chronic pain   Pain Onset More than a month ago   Aggravating Factors  work activities   Pain Relieving Factors ice or head, muscle rub   Effect of Pain on Daily Activities painful and feels has to push through to complete work  activities            [facility] PT Assessment - 05/10/14 0001    Assessment   Medical Diagnosis cervical strain   Onset Date 03/19/14   Prior Therapy FOTO 44% limitation 32% predicted   Balance Screen   Has the patient fallen in the past 6 months No   Has the patient had a decrease in activity level because of a fear of falling?  Yes   Is the patient reluctant to leave their home because of a fear of falling?  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#   Leisure spending time with grandaughter   Cognition   Overall Cognitive Status Within Functional Limits for tasks assessed   Sensation   Light Touch Appears Intact   Coordination   Gross Motor Movements are Fluid and Coordinated Yes   Fine Motor Movements are Fluid and Coordinated Yes   Posture/Postural Control   Posture/Postural  Control Postural limitations   Postural Limitations Rounded Shoulders;Forward head   ROM / Strength   AROM / PROM / Strength AROM;Strength   AROM   Overall AROM Comments cervial AROM WNL, mild pain on left side with side bending bilaterally and with extension    AROM Assessment Site Cervical;Shoulder   Right/Left Shoulder Left   Left Shoulder Flexion 143 Degrees   Left Shoulder Internal Rotation 80 Degrees   Left Shoulder External Rotation 53 Degrees  with pain anteriorly   Strength   Strength Assessment Site Shoulder;Elbow   Right/Left Shoulder Right;Left   Right Shoulder Flexion 4+/5   Right Shoulder ABduction 4+/5   Left Shoulder Flexion 4/5   Left Shoulder ABduction 4/5  with pain   Right/Left Elbow Right;Left   Right Elbow Flexion 5/5   Right Elbow  Extension 4+/5   Left Elbow Flexion 4+/5   Left Elbow Extension 4+/5   Special Tests    Special Tests Rotator Cuff Impingement   Rotator Cuff Impingment tests Michel Bickers test;Empty Can test;Full Can test;Neer impingement test   Neer Impingement test    Findings Positive   Side Left   Hawkins-Kennedy test   Findings Positive   Side Left   Empty Can test   Findings Negative   Side Left   Full Can test   Findings Negative   Side Left                   [facility] Adult PT Treatment/Exercise - 05/10/14 0001    Exercises   Exercises Neck;Shoulder   Shoulder Exercises: Seated   Retraction AROM;Both;10 reps  5 sec hold   Neck Exercises: Stretches   Levator Stretch 2 reps;20 seconds  to the right to stretch left side   Corner Stretch Limitations attempted, but too painful, so did at doorway with elbow at side for external rotation stretch x 20 sec x 2                PT Education - 05/10/14 1738    Education provided Yes   Education Details HEP and biomechanics of shoulder with skeletal model   Person(s) Educated Patient   Methods Explanation;Demonstration;Handout   Comprehension Verbalized understanding;Need further instruction             PT Long Term Goals - 05/10/14 1742    PT LONG TERM GOAL #1   Title Patient to be independent with HEP for postural and rotator cuff strength and flexibility.  06/22/14   Status New   PT LONG TERM GOAL #2   Title Patient to report pain no more than 4/10 left shoulder with work activities.  06/22/14   Status New   PT LONG TERM GOAL #3   Title Patient to demonstrate AROM left shoulder flexion equal right.  06/22/14   Status New   PT LONG TERM GOAL #4   Title FOTO score improved to no more than 32% limitation.  06/22/14   Status New   PT LONG TERM GOAL #5   Title Patient to verbalize understanding of posture and body mechanics to reduce the risk of re-injury.  06/22/14   Status New               Plan - 05/10/14  1739    Clinical Impression Statement Patient presents with symptoms consistent with pain due to postural dysfunction, overuse and muscle guarding.  She will benefit from skilled PT to address deficits noted below and progress to goals.  Pt will benefit from skilled therapeutic intervention in order to improve on the following deficits Pain;Improper body mechanics;Postural dysfunction;Decreased strength;Decreased range of motion;Decreased activity tolerance   Rehab Potential Good   PT Frequency 1x / week   PT Duration 6 weeks   PT Treatment/Interventions ADLs/Self Care Home Management;Electrical Stimulation;Therapeutic exercise;Patient/family education;Moist Heat;Traction;Functional mobility training;Manual techniques;Passive range of motion;Ultrasound;Cryotherapy   PT Next Visit Plan Review HEP and progress with gentle strength with ER, biceps strengthening, manual for STW/TPR   Consulted and Agree with Plan of Care Patient         Problem List Patient Active Problem List   Diagnosis Date Noted  . Cervical strain 04/16/2014  . Cervicogenic headache 04/16/2014    Darrien Laakso,CYNDI 05/10/2014, 5:48 PM  [facility], [facility] [address] [place], [place], [address] Phone: [phone]   Fax:  [phone]

## 2014-05-18 ENCOUNTER — Ambulatory Visit: Payer: 59 | Admitting: Physical Therapy

## 2014-05-24 ENCOUNTER — Ambulatory Visit: Payer: 59 | Attending: Neurology

## 2014-05-24 DIAGNOSIS — R293 Abnormal posture: Secondary | ICD-10-CM | POA: Diagnosis not present

## 2014-05-24 DIAGNOSIS — M25512 Pain in left shoulder: Secondary | ICD-10-CM | POA: Diagnosis not present

## 2014-05-24 NOTE — Therapy (Signed)
[facility] [address] [place], [place], [address] Phone: [phone]   Fax:  [phone]  Physical Therapy Treatment  Patient Details  Name: Mary Franco MRN: [id number] Date of Birth: Mar 17, 1965 Referring Provider:  Kathrynn Ducking, MD  Encounter Date: 05/24/2014      PT End of Session - 05/24/14 1212    Visit Number 2   Number of Visits 6   Date for PT Re-Evaluation 06/22/14   PT Start Time 1102   PT Stop Time 1154   PT Time Calculation (min) 52 min   Activity Tolerance Patient tolerated treatment well   Behavior During Therapy [facility] for tasks assessed/performed      Past Medical History  Diagnosis Date  . Migraines   . Alcohol abuse   . Anxiety   . Asthma   . COPD (chronic obstructive pulmonary disease)   . Fibromyalgia   . Cervical strain 04/16/2014  . Cervicogenic headache 04/16/2014    Past Surgical History  Procedure Laterality Date  . Tubal ligation    . Appendectomy    . Knee surgery Left     There were no vitals filed for this visit.  Visit Diagnosis:  Left shoulder pain  Posture abnormality      Subjective Assessment - 05/24/14 1114    Subjective  Reports more lower back pain today in middle and left after standing in line and turned around to look at something and felt a snap and has had pain since.  Put muscle rub on it.   Currently in Pain? Yes   Pain Orientation Mid;Left   Pain Descriptors / Indicators Aching   Pain Type Acute pain   Pain Onset Yesterday   Aggravating Factors  twisting   Pain Relieving Factors muscle rub, rest                       [facility] Adult PT Treatment/Exercise - 05/24/14 1123    Exercises   Exercises Lumbar   Neck Exercises: Machines for Strengthening   Other Machines for Strengthening Nu Step level 3 UE/LE x 6 min   Neck Exercises: Theraband   Scapula Retraction 5 reps  5 sec hold   Rows 10 reps;Red   Rows Limitations left arm  cues for technique   Shoulder External Rotation 10 reps;Red   Shoulder External Rotation Limitations left arm; towel roll at elbow for technique   Shoulder Internal Rotation 10 reps;Red   Shoulder Internal Rotation Limitations left arm; towel roll at elbow for technique   Other Theraband Exercises serratus punch with red theraband x 10 left    Lumbar Exercises: Stretches   Quadruped Mid Back Stretch 3 reps;20 seconds   Quadruped Mid Back Stretch Limitations child's pose forward x 3, then to right x 2 (for left side stretch)   Manual Therapy   Manual Therapy Myofascial release;Passive ROM;Manual Traction;Other (comment)  kinesiotape   Myofascial Release to left cervical paraspinals and left upper trap x 30-60 sec hold   Passive ROM manual strech to left upper trap, levator and bilateral scalenes/SCM   Manual Traction SOR x 2 minute hold   Other Manual Therapy kinesiotex tape along left upper trap "I" strip with off the paper tension                PT Education - 05/24/14 1211    Education provided Yes   Education Details HEP   Person(s) Educated Patient   Methods  Explanation;Demonstration;Handout   Comprehension Returned demonstration;Need further instruction             PT Long Term Goals - 05/24/14 1220    PT LONG TERM GOAL #1   Title Patient to be independent with HEP for postural and rotator cuff strength and flexibility.  06/22/14   Status On-going   PT LONG TERM GOAL #2   Title Patient to report pain no more than 4/10 left shoulder with work activities.  06/22/14   Status On-going   PT LONG TERM GOAL #3   Title Patient to demonstrate AROM left shoulder flexion equal right.  06/22/14   Status On-going   PT LONG TERM GOAL #4   Title FOTO score improved to no more than 32% limitation.  06/22/14   Status On-going   PT LONG TERM GOAL #5   Title Patient to verbalize understanding of posture and body mechanics to reduce the risk of re-injury.  06/22/14   Status On-going                Plan - 05/24/14 1212    Clinical Impression Statement Patient needed min to mod cues for proper HEP performance.  Does seem more aware of posture.  Reports lots of personal/family stress currently with son incarcerated and manipulative girlfriend making it difficult for pt to see her grandaughter.  Patient with trigger points palpable left upper trap.  Does report minor improvement in shoulder.  Continue skilled PT to progress to goals.   Pt will benefit from skilled therapeutic intervention in order to improve on the following deficits Pain;Improper body mechanics;Postural dysfunction;Decreased strength;Decreased range of motion;Decreased activity tolerance   Rehab Potential Good   PT Frequency 1x / week   PT Duration 6 weeks   PT Treatment/Interventions ADLs/Self Care Home Management;Electrical Stimulation;Therapeutic exercise;Patient/family education;Moist Heat;Traction;Functional mobility training;Manual techniques;Passive range of motion;Ultrasound;Cryotherapy   PT Next Visit Plan Check shoulder ROM, review new HEP.  Continue gentle left sided stretching; core stabilization, continue postural strengthening        Problem List Patient Active Problem List   Diagnosis Date Noted  . Cervical strain 04/16/2014  . Cervicogenic headache 04/16/2014    Armoni Depass,CYNDI 05/24/2014, 12:22 PM  [facility], [place] [address] [place], [place], [address] Phone: [phone]   Fax:  [phone]

## 2014-05-24 NOTE — Patient Instructions (Signed)
Strengthening: Resisted Internal Rotation   Hold tubing in left hand, elbow at side and forearm out. Rotate forearm in across body. Repeat __10-15__ times per set. Do __1__ sets per session. Do __1__ sessions per day.  http://orth.exer.us/831   Copyright  VHI. All rights reserved.  Rotation: External (Single Arm)   Side toward anchor in shoulder width stance with elbow bent to 90, arm across mid-section. Thumb up, pull arm away from body, keeping elbow bent. Repeat 10-15__ times per set. Repeat with other arm. Do 1__ sets per session. Do _1_ sessions per week. Anchor Height: Waist  http://tub.exer.us/116   Copyright  VHI. All rights reserved.  (Home) Retraction: Row - Bilateral (Anchor)   Facing anchor, arms reaching forward, pull hands toward stomach, pinching shoulder blades together. Repeat _10-15___ times per set. Do __1__ sets per session. Do __1__ sessions per week.  Copyright  VHI. All rights reserved.  Resistance: Arm Punch (Alternate)   Face away from anchor in stride stance. Push one arm forward. Fully straighten elbow and push shoulder forward as far as possible. Push other arm forward as first arm returns. Rotate trunk. Repeat _10-15__ times each arm, alternating, per set.  Do _1__ sets per session.  Just do one arm (left)  http://plyo.exer.us/225   Copyright  VHI. All rights reserved.

## 2014-05-29 ENCOUNTER — Ambulatory Visit: Payer: 59 | Admitting: Rehabilitative and Restorative Service Providers"

## 2014-05-29 ENCOUNTER — Telehealth: Payer: Self-pay | Admitting: Neurology

## 2014-05-29 DIAGNOSIS — M25512 Pain in left shoulder: Secondary | ICD-10-CM

## 2014-05-29 DIAGNOSIS — R293 Abnormal posture: Secondary | ICD-10-CM

## 2014-05-29 MED ORDER — GABAPENTIN 300 MG PO CAPS: 300.0000 mg | ORAL_CAPSULE | Freq: Two times a day (BID) | ORAL | Status: DC

## 2014-05-29 NOTE — Telephone Encounter (Signed)
The patient indicates that she got a lot of benefit with use of gabapentin, I'll call in a prescription for her.

## 2014-05-29 NOTE — Telephone Encounter (Signed)
I called the patient. She stated that she felt like she had ace bandages wrapped around her arms all day, until she took a Neurontin that someone else gave her, and it really helped. She is not in any pain now. She would like to know if you would write a prescription for Neurontin?

## 2014-05-29 NOTE — Therapy (Signed)
[facility] [address] [place], [place], [address] Phone: [phone]   Fax:  [phone]  Physical Therapy Treatment  Patient Details  Name: MANIE BEALER MRN: [id number] Date of Birth: 08/26/1965 Referring Provider:  Kathrynn Ducking, MD  Encounter Date: 05/29/2014      PT End of Session - 05/29/14 1444    Visit Number 3   Number of Visits 6   Date for PT Re-Evaluation 06/22/14   PT Start Time 1020   PT Stop Time 1105   PT Time Calculation (min) 45 min   Activity Tolerance Patient tolerated treatment well   Behavior During Therapy [facility] for tasks assessed/performed      Past Medical History  Diagnosis Date  . Migraines   . Alcohol abuse   . Anxiety   . Asthma   . COPD (chronic obstructive pulmonary disease)   . Fibromyalgia   . Cervical strain 04/16/2014  . Cervicogenic headache 04/16/2014    Past Surgical History  Procedure Laterality Date  . Tubal ligation    . Appendectomy    . Knee surgery Left     There were no vitals filed for this visit.  Visit Diagnosis:  Left shoulder pain  Posture abnormality      Subjective Assessment - 05/29/14 1025    Subjective The patient reports she worked 12 hour shifts Fri-Sunday.  She reports yesterday she had muscle "drawing" sensation in both hands and could not pick up coffee cup.  She feels sore and notes sensation of  squeezing in both arms.  She also reports a squeezing sensation in the left leg like an "ace bandage is too tight".   Currently in Pain? Yes   Pain Score 5    Pain Location Shoulder  and neck   Pain Orientation Left   Pain Descriptors / Indicators Sore   Pain Radiating Towards left side   Pain Onset More than a month ago   Pain Frequency Intermittent   Aggravating Factors  stressed with family situation re: son and granddaughter   Pain Relieving Factors patient took neurontin (from friend) yesterday and it helped     THERAPEUTIC  EXERCISE: Towel roll stretch supine with UEs abducted to 90 degrees for anterior chest stretch Door frame stretch for neural gliding Physioball stretch for anterior chest extending over ball  Seated "W" scapular retraction on physioball Supine shoulder external rotation bilaterally Supine shoulder flexion stretch Seated A/ROM shoulder flexion/abduction  MANUAL: Soft tissue mobilization left rhomboids, levator scapulae, and parascapular musculature  PT and patient discussed stress management and patient discussed many stressors at this time, which appear to be contributing to her muscle tightness and decreased use of UEs.  She reports when her hands would not move yesterday, it was after visiting her son in prison.  She is also having difficulty with her daughter-in-law arranging times for her to see her granddaughter, as well as other family issues.        PT Long Term Goals - 05/24/14 1220    PT LONG TERM GOAL #1   Title Patient to be independent with HEP for postural and rotator cuff strength and flexibility.  06/22/14   Status On-going   PT LONG TERM GOAL #2   Title Patient to report pain no more than 4/10 left shoulder with work activities.  06/22/14   Status On-going   PT LONG TERM GOAL #3   Title Patient to demonstrate AROM left shoulder flexion equal right.  06/22/14   Status On-going   PT LONG TERM GOAL #4   Title FOTO score improved to no more than 32% limitation.  06/22/14   Status On-going   PT LONG TERM GOAL #5   Title Patient to verbalize understanding of posture and body mechanics to reduce the risk of re-injury.  06/22/14   Status On-going               Plan - 05/29/14 1444    Clinical Impression Statement The patient c/o worsening pain and asked for prescription medications to help with pain.  PT reviewed our role in her care and recommended medication inquiries go to her doctor.   When further asked about pain, the patient reported that her inability to use her  hands occurred after visiting her son in prison.  PT discussed this symptom as it relates to stress/anxiety vs. a true worsening of symtpoms.   PT Next Visit Plan Check shoulder ROM, review new HEP.  Continue gentle left sided stretching; core stabilization, continue postural strengthening   Consulted and Agree with Plan of Care Patient        Problem List Patient Active Problem List   Diagnosis Date Noted  . Cervical strain 04/16/2014  . Cervicogenic headache 04/16/2014    Chasiti Waddington, PT 05/29/2014, 2:50 PM  [facility] [address] [place], [place], [address] Phone: [phone]   Fax:  [phone]

## 2014-05-29 NOTE — Telephone Encounter (Signed)
She had tightness from both elbows down to her hands and her hands were so stiff she could not move them by themselves and it stayed like that all day until she took nuronten and after she took that the tightness went away. Also, had numbness in her knee cap to her toes. She was over at physical therapy and they couldn't get all the knots out, she has to do physical therapy before she can do her MRI. THe best number to contact her is [phone]

## 2014-06-06 ENCOUNTER — Ambulatory Visit: Payer: 59 | Admitting: Rehabilitative and Restorative Service Providers"

## 2014-06-06 DIAGNOSIS — M25512 Pain in left shoulder: Secondary | ICD-10-CM | POA: Diagnosis not present

## 2014-06-06 DIAGNOSIS — R293 Abnormal posture: Secondary | ICD-10-CM

## 2014-06-06 NOTE — Therapy (Signed)
[facility] [address] [place], [place], [address] Phone: [phone]   Fax:  [phone]  Physical Therapy Treatment  Patient Details  Name: Mary Franco MRN: [id number] Date of Birth: 1965/03/06 Referring Provider:  Kathrynn Ducking, MD  Encounter Date: 06/06/2014      PT End of Session - 06/06/14 1056    Visit Number 4   Number of Visits 6   Date for PT Re-Evaluation 06/22/14   PT Start Time 1020   PT Stop Time 1100   PT Time Calculation (min) 40 min   Activity Tolerance Patient tolerated treatment well   Behavior During Therapy [facility] for tasks assessed/performed      Past Medical History  Diagnosis Date  . Migraines   . Alcohol abuse   . Anxiety   . Asthma   . COPD (chronic obstructive pulmonary disease)   . Fibromyalgia   . Cervical strain 04/16/2014  . Cervicogenic headache 04/16/2014    Past Surgical History  Procedure Laterality Date  . Tubal ligation    . Appendectomy    . Knee surgery Left     There were no vitals filed for this visit.  Visit Diagnosis:  Posture abnormality  Left shoulder pain      Subjective Assessment - 06/06/14 1025    Subjective The patient reports stress is contributing to increased tightness.  She got her son out of jail yesterday and reports he took money and used it for drugs.  She is also not seeing her granddaughter often b/c of the family situation.  She also reports she locked her keys in her car and had to break the window in order get them out.     Currently in Pain? No/denies   Pain Score --  not a lot of pain, just feels tight   Pain Location Neck   Pain Orientation Left   Pain Radiating Towards left side   Aggravating Factors  stress   Pain Relieving Factors medications- pt began taking gabapentin     THERAPEUTIC EXERCISE: Quadriped cat/cow exercises x 10 reps Standing wall push-ups x 10 reps Physioball sitting with "W" exercise x 10  reps Physioball anterior chest wall stretch x 2 reps Physioball sitting with shoulder flexion x 10 reps Quadriped trunk rotation with UE reaching activities x 10 reps Scapular retraction supine x 10 reps with UEs at neutral (bending elbows to 90 deg) with towel roll  Prone on elbows with unilateral/alternating UE reaching x 5 reps Prone trunk extension raising chest off of mat (arms at sides) x 10 reps  MANUAL: Supine manual cervical distraction with passive overpressure into sidebending and rotation Sidelying L parascapular soft tissue mobilization with focus on rhomboids, subscapularis and levator Prone Grade II mobilization P>A direction mid thoracic spine (due to hypomobility between shoulder blades contributing to postural changes)        PT Long Term Goals - 06/06/14 1033    PT LONG TERM GOAL #1   Title Patient to be independent with HEP for postural and rotator cuff strength and flexibility.  06/22/14   Status On-going   PT LONG TERM GOAL #2   Title Patient to report pain no more than 4/10 left shoulder with work activities.  06/22/14   Status On-going   PT LONG TERM GOAL #3   Title Patient to demonstrate AROM left shoulder flexion equal right.  06/22/14   Baseline Full A/ROM with shoulder flexion seated.   Status Achieved  PT LONG TERM GOAL #4   Title FOTO score improved to no more than 32% limitation.  06/22/14   Status On-going   PT LONG TERM GOAL #5   Title Patient to verbalize understanding of posture and body mechanics to reduce the risk of re-injury.  06/22/14   Status On-going               Plan - 06/06/14 1217    Clinical Impression Statement The patient feels personal stress aggravating neck/shoulder postural tightness.  She has not had time to complete HEP (except shoulder circles) due to recent personal events.  Patient encouraged to make time for HEP to progress strengthening.  She met one LTG.   PT Next Visit Plan Progress HEP (maybe adding prone on  elbows/reaching and prone chest raises from bed), postural strengthening, review HEP, and d/c planning   Consulted and Agree with Plan of Care Patient        Problem List Patient Active Problem List   Diagnosis Date Noted  . Cervical strain 04/16/2014  . Cervicogenic headache 04/16/2014    Alzena Gerber, PT 06/06/2014, 12:23 PM  [facility] [address] [place], [place], [address] Phone: [phone]   Fax:  [phone]

## 2014-06-12 ENCOUNTER — Encounter: Payer: Self-pay | Admitting: Physical Therapy

## 2014-06-12 ENCOUNTER — Ambulatory Visit: Payer: 59 | Admitting: Physical Therapy

## 2014-06-12 DIAGNOSIS — M25512 Pain in left shoulder: Secondary | ICD-10-CM

## 2014-06-12 DIAGNOSIS — R293 Abnormal posture: Secondary | ICD-10-CM

## 2014-06-12 NOTE — Therapy (Signed)
[facility] [address] [place], [place], [address] Phone: [phone]   Fax:  [phone]  Physical Therapy Treatment  Patient Details  Name: Mary Franco MRN: [id number] Date of Birth: 03/10/1965 Referring Provider:  Kathrynn Ducking, MD  Encounter Date: 06/12/2014      PT End of Session - 06/12/14 1109    Visit Number 5   Number of Visits 6   Date for PT Re-Evaluation 06/22/14   PT Start Time 1101   PT Stop Time 1144   PT Time Calculation (min) 43 min   Activity Tolerance Patient tolerated treatment well   Behavior During Therapy [facility] for tasks assessed/performed      Past Medical History  Diagnosis Date  . Migraines   . Alcohol abuse   . Anxiety   . Asthma   . COPD (chronic obstructive pulmonary disease)   . Fibromyalgia   . Cervical strain 04/16/2014  . Cervicogenic headache 04/16/2014    Past Surgical History  Procedure Laterality Date  . Tubal ligation    . Appendectomy    . Knee surgery Left     There were no vitals filed for this visit.  Visit Diagnosis:  Posture abnormality  Left shoulder pain      Subjective Assessment - 06/12/14 1107    Subjective No new complaints. Still feels stress is not helping her and that she has a pinched nerve. Has had her granddaugher since yesterday and not sure where or when she will be leaving her.   Currently in Pain? No/denies   Pain Score 0-No pain  feeling more tightness than pain     Treatment Manual therapy Soft tissue mobs to bil cervical's and upper traps Gentle distraction to cervical spine Suboccipital release Trigger point release and positional release Scapular mobs all directions * pt with many trigger points palpated in her cervical spine, rhomboids, traps and scapular muscles.    Exercise Posterior shoulder rolls x 10 reps Scapular retractions 5 sec hold x 10 reps with elbow at 90 degrees at sides Quadriped cat/cow  exercises x 10 reps Quadriped walking forward/backward with emphasis on scapular stability x 10 reps Prone on elbows with unilateral/alternating UE reaching x 5 reps Prone press ups x 10 reps Red pball at wall: - rolling up wall with flexion stretch x 10 reps - circles both ways x 10 each way with emphasis on scapular stability - push ups x 10 reps with cues on form and scapular stability Seated stretches - upper trap neural stretch 20 sec x 3 each way - lateral flexion stretch 20 sec x 3 each way        PT Long Term Goals - 06/06/14 1033    PT LONG TERM GOAL #1   Title Patient to be independent with HEP for postural and rotator cuff strength and flexibility.  06/22/14   Status On-going   PT LONG TERM GOAL #2   Title Patient to report pain no more than 4/10 left shoulder with work activities.  06/22/14   Status On-going   PT LONG TERM GOAL #3   Title Patient to demonstrate AROM left shoulder flexion equal right.  06/22/14   Baseline Full A/ROM with shoulder flexion seated.   Status Achieved   PT LONG TERM GOAL #4   Title FOTO score improved to no more than 32% limitation.  06/22/14   Status On-going   PT LONG TERM GOAL #5   Title Patient to verbalize understanding  of posture and body mechanics to reduce the risk of re-injury.  06/22/14   Status On-going           Plan - 06/12/14 1109    Clinical Impression Statement Pt feels that therapy is helping her despite her stress causing her pain and tightness. Making progress toward goals.    Pt will benefit from skilled therapeutic intervention in order to improve on the following deficits Pain;Improper body mechanics;Postural dysfunction;Decreased strength;Decreased range of motion;Decreased activity tolerance   Rehab Potential Good   PT Frequency 1x / week   PT Duration 6 weeks   PT Treatment/Interventions ADLs/Self Care Home Management;Electrical Stimulation;Therapeutic exercise;Patient/family education;Moist Heat;Traction;Functional  mobility training;Manual techniques;Passive range of motion;Ultrasound;Cryotherapy   PT Next Visit Plan Progress HEP (maybe adding prone on elbows/reaching and prone chest raises from bed), postural strengthening, review HEP, asses goals and d/c next visit   Consulted and Agree with Plan of Care Patient        Problem List Patient Active Problem List   Diagnosis Date Noted  . Cervical strain 04/16/2014  . Cervicogenic headache 04/16/2014    Willow Ora 06/12/2014, 6:22 PM  Willow Ora, PTA, [facility] [address] [place], [place] [address] [phone] 06/12/2014, 6:22 PM

## 2014-06-20 ENCOUNTER — Ambulatory Visit: Payer: 59 | Attending: Neurology | Admitting: Rehabilitative and Restorative Service Providers"

## 2014-06-20 ENCOUNTER — Encounter: Payer: Self-pay | Admitting: Rehabilitative and Restorative Service Providers"

## 2014-06-20 DIAGNOSIS — M25512 Pain in left shoulder: Secondary | ICD-10-CM | POA: Insufficient documentation

## 2014-06-20 DIAGNOSIS — R293 Abnormal posture: Secondary | ICD-10-CM | POA: Diagnosis not present

## 2014-06-20 NOTE — Patient Instructions (Signed)
Healthy Back - Shoulder Roll   Stand straight with arms relaxed at sides. Roll shoulders backward continuously. Do __10__ times.  This exercise can also be done one shoulder at a time.  Copyright  VHI. All rights reserved.  Posture - Standing   Good posture is important. Avoid slouching and forward head thrust. Maintain curve in low back and align ears over shoul- ders, hips over ankles.   Copyright  VHI. All rights reserved.

## 2014-06-20 NOTE — Therapy (Signed)
[facility] [address] [place], [place], [address] Phone: [phone]   Fax:  [phone]  Patient Details  Name: Mary Franco MRN: [id number] Date of Birth: [birth year] Referring Provider:  No ref. provider found  Encounter Date: 06/20/2014  PHYSICAL THERAPY DISCHARGE SUMMARY  Visits from Start of Care: 6  Current functional level related to goals / functional outcomes:     PT Long Term Goals - 06/20/14 1112    PT LONG TERM GOAL #1   Title Patient to be independent with HEP for postural and rotator cuff strength and flexibility.  06/22/14   Baseline Met on 06/20/2014   Status Achieved   PT LONG TERM GOAL #2   Title Patient to report pain no more than 4/10 left shoulder with work activities.  06/22/14   Baseline Met on 06/20/2014   Status Achieved   PT LONG TERM GOAL #3   Title Patient to demonstrate AROM left shoulder flexion equal right.  06/22/14   Baseline Full A/ROM with shoulder flexion seated.   Status Achieved   PT LONG TERM GOAL #4   Title FOTO score improved to no more than 32% limitation.  06/22/14   Baseline Patient improved functional status score from 56% up to 96% and decreased neck disability from 32% to 12%.   Status Achieved   PT LONG TERM GOAL #5   Title Patient to verbalize understanding of posture and body mechanics to reduce the risk of re-injury.  06/22/14   Baseline Met on 06/20/2014   Status Achieved        Remaining deficits: Stress creates tightness (pt with HEP to address)   Education / Equipment: HEP, posture, body mechanics.  Plan: Patient agrees to discharge.  Patient goals were met. Patient is being discharged due to meeting the stated rehab goals.  ?????   Thank you for the referral of this patient.       [place] , [facility]  06/20/2014, 11:45 AM [place] Cobb, [facility] [address] [place],  [place], [address] Phone: [phone]   Fax:  [phone]

## 2014-06-20 NOTE — Therapy (Signed)
[facility] [address] [place], [place], [address] Phone: [phone]   Fax:  [phone]  Physical Therapy Treatment  Patient Details  Name: Mary Franco MRN: [id number] Date of Birth: 1965-06-26 Referring Provider:  Kathrynn Ducking, MD  Encounter Date: 06/20/2014      PT End of Session - 06/20/14 1128    Visit Number 6   Number of Visits 6   Date for PT Re-Evaluation 06/22/14   PT Start Time 1110   PT Stop Time 1140   PT Time Calculation (min) 30 min   Activity Tolerance Patient tolerated treatment well   Behavior During Therapy [facility] for tasks assessed/performed      Past Medical History  Diagnosis Date  . Migraines   . Alcohol abuse   . Anxiety   . Asthma   . COPD (chronic obstructive pulmonary disease)   . Fibromyalgia   . Cervical strain 04/16/2014  . Cervicogenic headache 04/16/2014    Past Surgical History  Procedure Laterality Date  . Tubal ligation    . Appendectomy    . Knee surgery Left     There were no vitals filed for this visit.  Visit Diagnosis:  Posture abnormality      Subjective Assessment - 06/20/14 1111    Subjective "I'm actually a lot better."  Pt still notices fatigue in muscles near end of work shift.     Pertinent History No pain while sleeping at this time.   Currently in Pain? No/denies      THERAPEUTIC EXERCISE: Reviewed all HEP and provided shoulder roll  SELF CARE/HOME MANAGEMENT: Reviewed posture and body mechanics for work environment and recommended continue strengthening exercise post d/c due to work demands, stress management, and posture awareness to maintain gains made in PT          PT Education - 06/20/14 1130    Education provided Yes   Education Details HEP: shoulder roll, posture   Person(s) Educated Patient   Methods Explanation;Demonstration;Handout   Comprehension Verbalized understanding;Returned demonstration              PT Long Term Goals - 06/20/14 1112    PT LONG TERM GOAL #1   Title Patient to be independent with HEP for postural and rotator cuff strength and flexibility.  06/22/14   Baseline Met on 06/20/2014   Status Achieved   PT LONG TERM GOAL #2   Title Patient to report pain no more than 4/10 left shoulder with work activities.  06/22/14   Baseline Met on 06/20/2014   Status Achieved   PT LONG TERM GOAL #3   Title Patient to demonstrate AROM left shoulder flexion equal right.  06/22/14   Baseline Full A/ROM with shoulder flexion seated.   Status Achieved   PT LONG TERM GOAL #4   Title FOTO score improved to no more than 32% limitation.  06/22/14   Baseline Patient improved functional status score from 56% up to 96% and decreased neck disability from 32% to 12%.   Status Achieved   PT LONG TERM GOAL #5   Title Patient to verbalize understanding of posture and body mechanics to reduce the risk of re-injury.  06/22/14   Baseline Met on 06/20/2014   Status Achieved               Plan - 06/20/14 1142    Clinical Impression Statement The patient met all LTGs.  She continues to have factors that place her  at risk for neck pain including repetitive job tasks, as well as stress from social relationships (son recently improsoned with drug use and concern over granddaughter noted).  PT recommended patient continue HEP for shoulder strengthening, postural stabilization and educated patient on proper posture and mechanics.   PT Next Visit Plan discharge today   Consulted and Agree with Plan of Care Patient        Problem List Patient Active Problem List   Diagnosis Date Noted  . Cervical strain 04/16/2014  . Cervicogenic headache 04/16/2014    Lavaun Greenfield, PT 06/20/2014, 11:44 AM  [facility] [address] [place], [place], [address] Phone: [phone]   Fax:  [phone]

## 2014-08-15 ENCOUNTER — Encounter: Payer: Self-pay | Admitting: Nurse Practitioner

## 2014-08-15 ENCOUNTER — Ambulatory Visit (INDEPENDENT_AMBULATORY_CARE_PROVIDER_SITE_OTHER): Payer: 59 | Admitting: Nurse Practitioner

## 2014-08-15 VITALS — BP 137/80 | HR 97 | Ht 63.0 in | Wt 108.5 lb

## 2014-08-15 DIAGNOSIS — G4486 Cervicogenic headache: Secondary | ICD-10-CM

## 2014-08-15 DIAGNOSIS — R51 Headache: Secondary | ICD-10-CM | POA: Diagnosis not present

## 2014-08-15 DIAGNOSIS — S161XXD Strain of muscle, fascia and tendon at neck level, subsequent encounter: Secondary | ICD-10-CM | POA: Diagnosis not present

## 2014-08-15 NOTE — Progress Notes (Signed)
I reviewed note and agree with plan.   Penni Bombard, MD 5/53/7482, 7:07 PM Certified in Neurology, Neurophysiology and Neuroimaging  [facility] [address] [place], [place] [address] [phone]

## 2014-08-15 NOTE — Progress Notes (Signed)
[facility]  PATIENT: Mary Franco DOB: [birth year]   REASON FOR VISIT: Follow-up for left neck and shoulder pain associated with cervicogenic headache  HISTORY FROM: Patient    HISTORY OF PRESENT ILLNESS: Mary Franco, 49 year old female returns for follow-up. She was initially evaluated for neck and shoulder pain by Dr. Jannifer Franklin 04/16/2014. She has had some physical therapy which she found very beneficial and she claims she is continuing to do the exercises every day. She is currently on gabapentin but she only takes the medication when necessary she is no longer taking Robaxin. She continues to report fatigue and anxiety. She denies any problems with balance or difficulty controlling the bowels or bladder. She returns for reevaluation   HISTORY: Mary Franco is a 49 year old right-handed white female with a several year history of neck pain that will undergo associated with some left shoulder pain. The problem has been constant over the last 3 weeks and more severe. The patient has pain into her left shoulder, with numbness and tingly sensations going down the left arm to the hand. She also describes a numbness down the left leg to the foot. She has shoulder pain, with significant pain in the shoulder joint with abduction of the left arm. She denies any numbness on the face. She has pain going up the back of the head, and around the head from there. She also reports some chronic sinus issues over the last several months. The patient is having some cramping in the left arm and shoulder area. She reports fatigue, and anxiety. She denies problems with balance or difficulty controlling the bowels or the bladder. She does report some urinary frequency. She is sent to this office for further evaluation.    REVIEW OF SYSTEMS: Full 14 system review of systems performed and notable only for those listed, all others are neg:  Constitutional: Fatigue Cardiovascular:  neg Ear/Nose/Throat: neg  Skin: neg Eyes: neg Respiratory: neg Gastroitestinal: neg  Hematology/Lymphatic: neg  Endocrine: neg Musculoskeletal:neg Allergy/Immunology: neg Neurological: Occasional headache Psychiatric: Depression and anxiety Sleep : neg   ALLERGIES: Allergies  Allergen Reactions  . Codeine   . Sulfa Antibiotics     HOME MEDICATIONS: Outpatient Prescriptions Prior to Visit  Medication Sig Dispense Refill  . alprazolam (XANAX) 2 MG tablet Take 2 mg by mouth at bedtime as needed.    . gabapentin (NEURONTIN) 300 MG capsule Take 1 capsule (300 mg total) by mouth 2 (two) times daily. 60 capsule 3  . PROAIR HFA 108 (90 BASE) MCG/ACT inhaler as needed.  0  . promethazine (PHENERGAN) 25 MG tablet Take 1 tablet (25 mg total) by mouth every 8 (eight) hours as needed for nausea. 60 tablet 3  . diclofenac (VOLTAREN) 75 MG EC tablet Take 1 tablet (75 mg total) by mouth 2 (two) times daily. (Patient not taking: Reported on 05/10/2014) 60 tablet 1  . methocarbamol (ROBAXIN) 500 MG tablet Take 1 tablet (500 mg total) by mouth 3 (three) times daily. (Patient not taking: Reported on 08/15/2014) 90 tablet 1  . promethazine (PHENERGAN) 50 MG tablet Take 1 tablet (50 mg total) by mouth 2 (two) times daily. 50 tablet 2   No facility-administered medications prior to visit.    PAST MEDICAL HISTORY: Past Medical History  Diagnosis Date  . Migraines   . Alcohol abuse   . Anxiety   . Asthma   . COPD (chronic obstructive pulmonary disease)   . Fibromyalgia   . Cervical strain 04/16/2014  .  Cervicogenic headache 04/16/2014    PAST SURGICAL HISTORY: Past Surgical History  Procedure Laterality Date  . Tubal ligation    . Appendectomy    . Knee surgery Left     FAMILY HISTORY: Family History  Problem Relation Age of Onset  . Colitis Paternal Aunt   . Ovarian cancer Maternal Grandmother   . Cancer Mother     SOCIAL HISTORY: History   Social History  . Marital Status:  Divorced    Spouse Name: N/A  . Number of Children: 2  . Years of Education: N/A   Occupational History  . Textiles    Social History Main Topics  . Smoking status: Current Every Day Smoker -- 1.00 packs/day  . Smokeless tobacco: Never Used  . Alcohol Use: Yes     Comment: 2 x per week  . Drug Use: No  . Sexual Activity: Not on file   Other Topics Concern  . Not on file   Social History Narrative   ** Merged History Encounter **   Patient is right handed.   Patient drinks 3 cups caffeine daily.   Working on 2nd shift now at her job.  08-15-14            PHYSICAL EXAM  Filed Vitals:   08/15/14 0955  BP: 137/80  Pulse: 97  Height: 5\' 3"  (1.6 m)  Weight: 108 lb 8 oz (49.215 kg)   Body mass index is 19.22 kg/(m^2). General: The patient is alert and cooperative at the time of the examination. Neck: The neck is supple, no carotid bruits are noted. Neuromuscular: The left shoulder is elevated relative to the right. The patient has fair range of movement of the cervical spine. The patient has mild pain with internal and external rotation of the left shoulder, pain with abduction of the left arm past 90. No evidence of scoliosis is noted of the cervical or thoracic spine. Skin: Extremities are without significant edema.  Neurologic Exam Mental status: The patient is alert and oriented x 3 at the time of the examination. The patient has apparent normal recent and remote memory, with an apparently normal attention span and concentration ability. Cranial nerves: Facial symmetry is present. There is good sensation of the face to pinprick and soft touch bilaterally. The strength of the facial muscles and the muscles to head turning and shoulder shrug are normal bilaterally. Speech is well enunciated, no aphasia or dysarthria is noted.Pupils are equal, round, and reactive to light. Discs are flat bilaterally. Extraocular movements are full. Visual fields are full. The tongue is  midline, and the patient has symmetric elevation of the soft palate. No obvious hearing deficits are noted. Motor: The motor testing reveals 5 over 5 strength of all 4 extremities. Good symmetric motor tone is noted throughout. Sensory: Sensory testing is intact to pinprick, soft touch, vibration sensation, and position sense on all 4 extremities. No evidence of extinction is noted. Coordination: Cerebellar testing reveals good finger-nose-finger and heel-to-shin bilaterally. Gait and station: Gait is normal. Tandem gait is normal. Romberg is negative. No drift is seen. Reflexes: Deep tendon reflexes are symmetric and normal bilaterally. Toes are downgoing bilaterally.     DIAGNOSTIC DATA (LABS, IMAGING, TESTING)   ASSESSMENT AND PLAN  50 y.o. year old female  has a past medical history of Migraines; Alcohol abuse; Anxiety;  Cervical strain (04/16/2014); and Cervicogenic headache (04/16/2014). here to follow-up. Her neck and shoulder pain is in better control after physical therapy. She is continuing  to do exercises. MRI of the cervical spine was denied by insurance.The patient is a current patient of Dr. Jannifer Franklin   who is out of the office today . This note is sent to the work in doctor.     Continue gabapentin 300 mg twice daily does not need refills Continue exercises at least daily for neck and shoulder discomfort from physical therapy Follow-up in 6 months Need to find a primary care physician Dennie Bible, [facility], [facility], [facility] [address] [place], [place] [address] [phone]

## 2014-08-15 NOTE — Patient Instructions (Signed)
Continue gabapentin 300 mg does not need refills Continue exercises at least daily for neck and shoulder discomfort for physical therapy Follow-up in 6 months Need to find a primary care physician

## 2014-12-04 ENCOUNTER — Encounter: Payer: Self-pay | Admitting: Nurse Practitioner

## 2015-02-07 ENCOUNTER — Ambulatory Visit: Payer: 59 | Admitting: Nurse Practitioner

## 2016-11-13 ENCOUNTER — Ambulatory Visit: Payer: 59 | Admitting: Gastroenterology

## 2018-02-01 DIAGNOSIS — N870 Mild cervical dysplasia: Secondary | ICD-10-CM | POA: Diagnosis not present

## 2018-02-01 DIAGNOSIS — R87610 Atypical squamous cells of undetermined significance on cytologic smear of cervix (ASC-US): Secondary | ICD-10-CM | POA: Diagnosis not present

## 2018-02-15 DIAGNOSIS — I509 Heart failure, unspecified: Secondary | ICD-10-CM

## 2018-02-15 HISTORY — DX: Heart failure, unspecified: I50.9

## 2018-02-27 DIAGNOSIS — R05 Cough: Secondary | ICD-10-CM | POA: Diagnosis not present

## 2018-02-27 DIAGNOSIS — J209 Acute bronchitis, unspecified: Secondary | ICD-10-CM | POA: Diagnosis not present

## 2018-02-27 DIAGNOSIS — R062 Wheezing: Secondary | ICD-10-CM | POA: Diagnosis not present

## 2018-03-05 DIAGNOSIS — I10 Essential (primary) hypertension: Secondary | ICD-10-CM | POA: Diagnosis not present

## 2018-03-05 DIAGNOSIS — R05 Cough: Secondary | ICD-10-CM | POA: Diagnosis not present

## 2018-03-05 DIAGNOSIS — E876 Hypokalemia: Secondary | ICD-10-CM | POA: Diagnosis not present

## 2018-03-05 DIAGNOSIS — F17210 Nicotine dependence, cigarettes, uncomplicated: Secondary | ICD-10-CM | POA: Diagnosis not present

## 2018-03-05 DIAGNOSIS — R079 Chest pain, unspecified: Secondary | ICD-10-CM | POA: Diagnosis not present

## 2018-03-05 DIAGNOSIS — R9431 Abnormal electrocardiogram [ECG] [EKG]: Secondary | ICD-10-CM | POA: Diagnosis not present

## 2018-03-06 DIAGNOSIS — I5020 Unspecified systolic (congestive) heart failure: Secondary | ICD-10-CM | POA: Diagnosis not present

## 2018-03-06 DIAGNOSIS — R0602 Shortness of breath: Secondary | ICD-10-CM | POA: Diagnosis not present

## 2018-03-06 DIAGNOSIS — R9431 Abnormal electrocardiogram [ECG] [EKG]: Secondary | ICD-10-CM | POA: Diagnosis not present

## 2018-03-06 DIAGNOSIS — I10 Essential (primary) hypertension: Secondary | ICD-10-CM | POA: Diagnosis not present

## 2018-03-07 ENCOUNTER — Inpatient Hospital Stay (HOSPITAL_COMMUNITY)
Admission: AD | Admit: 2018-03-07 | Discharge: 2018-03-09 | DRG: 286 | Disposition: A | Discharge status: Home / Self Care | Payer: Commercial Managed Care - PPO | Source: Other Acute Inpatient Hospital | Attending: Internal Medicine | Admitting: Internal Medicine

## 2018-03-07 DIAGNOSIS — E876 Hypokalemia: Secondary | ICD-10-CM | POA: Diagnosis present

## 2018-03-07 DIAGNOSIS — I428 Other cardiomyopathies: Secondary | ICD-10-CM | POA: Diagnosis present

## 2018-03-07 DIAGNOSIS — I248 Other forms of acute ischemic heart disease: Secondary | ICD-10-CM | POA: Diagnosis present

## 2018-03-07 DIAGNOSIS — I5021 Acute systolic (congestive) heart failure: Secondary | ICD-10-CM | POA: Diagnosis not present

## 2018-03-07 DIAGNOSIS — F17210 Nicotine dependence, cigarettes, uncomplicated: Secondary | ICD-10-CM

## 2018-03-07 DIAGNOSIS — K219 Gastro-esophageal reflux disease without esophagitis: Secondary | ICD-10-CM | POA: Diagnosis present

## 2018-03-07 DIAGNOSIS — I071 Rheumatic tricuspid insufficiency: Secondary | ICD-10-CM | POA: Diagnosis not present

## 2018-03-07 DIAGNOSIS — I2510 Atherosclerotic heart disease of native coronary artery without angina pectoris: Secondary | ICD-10-CM | POA: Diagnosis not present

## 2018-03-07 DIAGNOSIS — Z8041 Family history of malignant neoplasm of ovary: Secondary | ICD-10-CM

## 2018-03-07 DIAGNOSIS — I5031 Acute diastolic (congestive) heart failure: Secondary | ICD-10-CM

## 2018-03-07 DIAGNOSIS — F419 Anxiety disorder, unspecified: Secondary | ICD-10-CM | POA: Diagnosis present

## 2018-03-07 DIAGNOSIS — F1010 Alcohol abuse, uncomplicated: Secondary | ICD-10-CM | POA: Diagnosis present

## 2018-03-07 DIAGNOSIS — Z9851 Tubal ligation status: Secondary | ICD-10-CM | POA: Diagnosis not present

## 2018-03-07 DIAGNOSIS — J440 Chronic obstructive pulmonary disease with acute lower respiratory infection: Secondary | ICD-10-CM | POA: Diagnosis present

## 2018-03-07 DIAGNOSIS — M797 Fibromyalgia: Secondary | ICD-10-CM | POA: Diagnosis present

## 2018-03-07 DIAGNOSIS — I5023 Acute on chronic systolic (congestive) heart failure: Secondary | ICD-10-CM | POA: Diagnosis not present

## 2018-03-07 DIAGNOSIS — Z79899 Other long term (current) drug therapy: Secondary | ICD-10-CM

## 2018-03-07 DIAGNOSIS — J9601 Acute respiratory failure with hypoxia: Secondary | ICD-10-CM | POA: Diagnosis not present

## 2018-03-07 DIAGNOSIS — I110 Hypertensive heart disease with heart failure: Secondary | ICD-10-CM | POA: Diagnosis not present

## 2018-03-07 DIAGNOSIS — I340 Nonrheumatic mitral (valve) insufficiency: Secondary | ICD-10-CM | POA: Diagnosis not present

## 2018-03-07 DIAGNOSIS — I5043 Acute on chronic combined systolic (congestive) and diastolic (congestive) heart failure: Secondary | ICD-10-CM | POA: Diagnosis present

## 2018-03-07 DIAGNOSIS — Z79890 Hormone replacement therapy (postmenopausal): Secondary | ICD-10-CM | POA: Diagnosis not present

## 2018-03-07 DIAGNOSIS — J209 Acute bronchitis, unspecified: Secondary | ICD-10-CM

## 2018-03-07 DIAGNOSIS — Z885 Allergy status to narcotic agent status: Secondary | ICD-10-CM

## 2018-03-07 DIAGNOSIS — Z882 Allergy status to sulfonamides status: Secondary | ICD-10-CM | POA: Diagnosis not present

## 2018-03-07 DIAGNOSIS — R7989 Other specified abnormal findings of blood chemistry: Secondary | ICD-10-CM

## 2018-03-07 DIAGNOSIS — I361 Nonrheumatic tricuspid (valve) insufficiency: Secondary | ICD-10-CM | POA: Diagnosis not present

## 2018-03-07 DIAGNOSIS — R778 Other specified abnormalities of plasma proteins: Secondary | ICD-10-CM

## 2018-03-07 DIAGNOSIS — I081 Rheumatic disorders of both mitral and tricuspid valves: Secondary | ICD-10-CM | POA: Diagnosis present

## 2018-03-07 DIAGNOSIS — J449 Chronic obstructive pulmonary disease, unspecified: Secondary | ICD-10-CM | POA: Diagnosis not present

## 2018-03-07 DIAGNOSIS — I272 Pulmonary hypertension, unspecified: Secondary | ICD-10-CM | POA: Diagnosis not present

## 2018-03-07 DIAGNOSIS — R0602 Shortness of breath: Secondary | ICD-10-CM | POA: Diagnosis not present

## 2018-03-07 HISTORY — DX: Nicotine dependence, cigarettes, uncomplicated: F17.210

## 2018-03-07 HISTORY — DX: Acute bronchitis, unspecified: J20.9

## 2018-03-07 HISTORY — DX: Acute respiratory failure with hypoxia: J96.01

## 2018-03-07 LAB — BRAIN NATRIURETIC PEPTIDE: B Natriuretic Peptide: 1594.7 pg/mL — ABNORMAL HIGH (ref 0.0–100.0)

## 2018-03-07 LAB — BASIC METABOLIC PANEL
Anion gap: 15 (ref 5–15)
BUN: 11 mg/dL (ref 6–20)
CO2: 26 mmol/L (ref 22–32)
Calcium: 9.3 mg/dL (ref 8.9–10.3)
Chloride: 98 mmol/L (ref 98–111)
Creatinine, Ser: 0.95 mg/dL (ref 0.44–1.00)
GFR calc Af Amer: >60 mL/min (ref >60)
GFR calc non Af Amer: >60 mL/min (ref >60)
Glucose, Bld: 180 mg/dL — ABNORMAL HIGH (ref 70–99)
Potassium: 3.7 mmol/L (ref 3.5–5.1)
Sodium: 139 mmol/L (ref 135–145)

## 2018-03-07 LAB — CBC
HCT: 42.7 % (ref 36.0–46.0)
Hemoglobin: 13.5 g/dL (ref 12.0–15.0)
MCH: 31 pg (ref 26.0–34.0)
MCHC: 31.6 g/dL (ref 30.0–36.0)
MCV: 98.2 fL (ref 80.0–100.0)
Platelets: 422 10*3/uL — ABNORMAL HIGH (ref 150–400)
RBC: 4.35 MIL/uL (ref 3.87–5.11)
RDW: 12.7 % (ref 11.5–15.5)
WBC: 7.3 10*3/uL (ref 4.0–10.5)
nRBC: 0 % (ref 0.0–0.2)

## 2018-03-07 MED ORDER — BENZONATATE 100 MG PO CAPS
100.0000 mg | ORAL_CAPSULE | ORAL | Status: DC | PRN
  Administered 2018-03-08: 100 mg via ORAL
  Filled 2018-03-07: qty 1

## 2018-03-07 MED ORDER — ACETAMINOPHEN 650 MG RE SUPP: 650.0000 mg | Freq: Four times a day (QID) | RECTAL | Status: DC | PRN

## 2018-03-07 MED ORDER — SODIUM CHLORIDE 0.9 % WEIGHT BASED INFUSION
3.0000 mL/kg/h | INTRAVENOUS | Status: DC
  Administered 2018-03-08: 3 mL/kg/h via INTRAVENOUS

## 2018-03-07 MED ORDER — ENOXAPARIN SODIUM 40 MG/0.4ML ~~LOC~~ SOLN
40.0000 mg | SUBCUTANEOUS | Status: DC
  Administered 2018-03-07 – 2018-03-08 (×2): 40 mg via SUBCUTANEOUS
  Filled 2018-03-07 (×2): qty 0.4

## 2018-03-07 MED ORDER — GABAPENTIN 300 MG PO CAPS
300.0000 mg | ORAL_CAPSULE | Freq: Two times a day (BID) | ORAL | Status: DC
  Administered 2018-03-07 – 2018-03-09 (×3): 300 mg via ORAL
  Filled 2018-03-07 (×4): qty 1

## 2018-03-07 MED ORDER — ASPIRIN 81 MG PO CHEW
81.0000 mg | CHEWABLE_TABLET | ORAL | Status: AC
  Administered 2018-03-08: 81 mg via ORAL
  Filled 2018-03-07: qty 1

## 2018-03-07 MED ORDER — FUROSEMIDE 10 MG/ML IJ SOLN: 40.0000 mg | Freq: Every day | INTRAMUSCULAR | Status: DC

## 2018-03-07 MED ORDER — SODIUM CHLORIDE 0.9% FLUSH
3.0000 mL | Freq: Two times a day (BID) | INTRAVENOUS | Status: DC
  Administered 2018-03-07 – 2018-03-08 (×2): 3 mL via INTRAVENOUS

## 2018-03-07 MED ORDER — SODIUM CHLORIDE 0.9 % WEIGHT BASED INFUSION
1.0000 mL/kg/h | INTRAVENOUS | Status: DC
  Administered 2018-03-08: 1 mL/kg/h via INTRAVENOUS

## 2018-03-07 MED ORDER — ONDANSETRON HCL 4 MG/2ML IJ SOLN: 4.0000 mg | Freq: Four times a day (QID) | INTRAMUSCULAR | Status: DC | PRN

## 2018-03-07 MED ORDER — SODIUM CHLORIDE 0.9 % IV SOLN: 250.0000 mL | INTRAVENOUS | Status: DC | PRN

## 2018-03-07 MED ORDER — SODIUM CHLORIDE 0.9% FLUSH: 3.0000 mL | INTRAVENOUS | Status: DC | PRN

## 2018-03-07 MED ORDER — ACETAMINOPHEN 325 MG PO TABS: 650.0000 mg | ORAL_TABLET | Freq: Four times a day (QID) | ORAL | Status: DC | PRN

## 2018-03-07 MED ORDER — ALPRAZOLAM 0.5 MG PO TABS: 2.0000 mg | ORAL_TABLET | Freq: Every evening | ORAL | Status: DC | PRN

## 2018-03-07 MED ORDER — ALBUTEROL SULFATE (2.5 MG/3ML) 0.083% IN NEBU: 2.5000 mg | INHALATION_SOLUTION | RESPIRATORY_TRACT | Status: DC | PRN

## 2018-03-07 MED ORDER — PREDNISONE 50 MG PO TABS
50.0000 mg | ORAL_TABLET | Freq: Every day | ORAL | Status: DC
  Administered 2018-03-08 – 2018-03-09 (×2): 50 mg via ORAL
  Filled 2018-03-07 (×2): qty 1

## 2018-03-07 MED ORDER — ONDANSETRON HCL 4 MG PO TABS: 4.0000 mg | ORAL_TABLET | Freq: Four times a day (QID) | ORAL | Status: DC | PRN

## 2018-03-07 MED ORDER — DOXYCYCLINE HYCLATE 100 MG PO TABS
100.0000 mg | ORAL_TABLET | Freq: Two times a day (BID) | ORAL | Status: DC
  Administered 2018-03-07 – 2018-03-09 (×4): 100 mg via ORAL
  Filled 2018-03-07 (×4): qty 1

## 2018-03-07 MED ORDER — FUROSEMIDE 10 MG/ML IJ SOLN
40.0000 mg | Freq: Every day | INTRAMUSCULAR | Status: DC
  Administered 2018-03-07 – 2018-03-08 (×2): 40 mg via INTRAVENOUS
  Filled 2018-03-07 (×3): qty 4

## 2018-03-07 MED ORDER — IPRATROPIUM-ALBUTEROL 0.5-2.5 (3) MG/3ML IN SOLN
3.0000 mL | Freq: Four times a day (QID) | RESPIRATORY_TRACT | Status: DC
  Administered 2018-03-07: 3 mL via RESPIRATORY_TRACT
  Filled 2018-03-07: qty 3

## 2018-03-07 NOTE — Consult Note (Signed)
Cardiology Consultation:   Patient ID: Mary Franco MRN: [id number]; DOB: 1965-09-04  Admit date: 03/07/2018 Date of Consult: 03/07/2018  Primary Care Provider: No primary care provider on file. Primary Cardiologist: Nahser  Primary Electrophysiologist:      Patient Profile:   Mary Franco is a 53 y.o. female with a hx of anxiety, gastroesophageal reflux disease, tobacco abuse who is being seen today for the evaluation of worsening shortness of breath and possible congestive heart failure at the request of  Dr. Maylene Roes  History of Present Illness:   Mary Franco is a 53 year old female who was transferred from [facility] for further evaluation of shortness of breath and presumed congestive heart failure. She presented to the emergency room with a day of generalized fatigue and shortness of breath.  She had recently been diagnosed with bronchitis.  She is had markedly reduced exercise capacity.  She gets short of breath walking across the room. He does not pay attention to her diet.  She eats a fairly salty diet. Does not get any regular exercise. Most 1 pack of cigarettes a day for the past 40 years.  She was seen in the [place] ER in the troponin level was 0.06.  proBNP was 2820.     Past Medical History:  Diagnosis Date  . Alcohol abuse   . Anxiety   . Asthma   . Cervical strain 04/16/2014  . Cervicogenic headache 04/16/2014  . COPD (chronic obstructive pulmonary disease)   . Fibromyalgia   . Migraines     Past Surgical History:  Procedure Laterality Date  . APPENDECTOMY    . KNEE SURGERY Left   . TUBAL LIGATION       Home Medications:  Prior to Admission medications   Medication Sig Start Date End Date Taking? Authorizing Provider  albuterol (PROVENTIL) (2.5 MG/3ML) 0.083% nebulizer solution Inhale 3 mLs into the lungs every 4 (four) hours. 03/06/18   [provider]  alprazolam Duanne Moron) 2 MG tablet Take 2 mg by mouth at bedtime as needed.     [provider]  atenolol (TENORMIN) 25 MG tablet Take 25 mg by mouth daily. 03/06/18   [provider]  benzonatate (TESSALON) 100 MG capsule Take 100 mg by mouth every 4 (four) hours as needed for cough. For ten days. Do not exceed 600mg  per day. 02/27/18   [provider]  doxycycline (VIBRA-TABS) 100 MG tablet Take 100 mg by mouth 2 (two) times daily. For ten days Started on 02-27-18 02/27/18   [provider]  gabapentin (NEURONTIN) 300 MG capsule Take 1 capsule (300 mg total) by mouth 2 (two) times daily. 05/29/14   Kathrynn Ducking, MD  potassium chloride (MICRO-K) 10 MEQ CR capsule Take 10 mEq by mouth daily. 03/06/18   [provider]  PROAIR HFA 108 (90 BASE) MCG/ACT inhaler as needed. 01/31/14   [provider]  promethazine (PHENERGAN) 25 MG tablet Take 1 tablet (25 mg total) by mouth every 8 (eight) hours as needed for nausea. 02/12/12   Milus Banister, MD  promethazine (PHENERGAN) 50 MG tablet Take 1 tablet (50 mg total) by mouth 2 (two) times daily. 08/07/11 08/14/11  Milus Banister, MD  [facility] Name: melatonin 3mg  caps.  Taking 2 caps po qhs.    [provider]    Inpatient Medications: Scheduled Meds:  Continuous Infusions:  PRN Meds:   Allergies:    Allergies  Allergen Reactions  . Codeine   .  Sulfa Antibiotics     Social History:   Social History   Socioeconomic History  . Marital status: Divorced    Spouse name: Not on file  . Number of children: 2  . Years of education: Not on file  . Highest education level: Not on file  Occupational History  . Occupation: Copy  . Financial resource strain: Not on file  . Food insecurity:    Worry: Not on file    Inability: Not on file  . Transportation needs:    Medical: Not on file    Non-medical: Not on file  Tobacco Use  . Smoking status: Current Every Day Smoker    Packs/day: 1.00  . Smokeless tobacco: Never Used    Substance and Sexual Activity  . Alcohol use: Yes    Comment: 2 x per week  . Drug use: No  . Sexual activity: Not on file  Lifestyle  . Physical activity:    Days per week: Not on file    Minutes per session: Not on file  . Stress: Not on file  Relationships  . Social connections:    Talks on phone: Not on file    Gets together: Not on file    Attends religious service: Not on file    Active member of club or organization: Not on file    Attends meetings of clubs or organizations: Not on file    Relationship status: Not on file  . Intimate partner violence:    Fear of current or ex partner: Not on file    Emotionally abused: Not on file    Physically abused: Not on file    Forced sexual activity: Not on file  Other Topics Concern  . Not on file  Social History Narrative   ** Merged History Encounter **   Patient is right handed.   Patient drinks 3 cups caffeine daily.   Working on 2nd shift now at her job.  08-15-14           Family History:    Family History  Problem Relation Age of Onset  . Colitis Paternal Aunt   . Ovarian cancer Maternal Grandmother   . Cancer Mother      ROS:  Please see the history of present illness.   All other ROS reviewed and negative.     Physical Exam/Data:   Vitals:   03/07/18 1741  BP: 122/82  Pulse: 82  Resp: 18  Temp: (!) 97.5 F (36.4 C)  TempSrc: Oral  SpO2: 98%  Weight: 57.2 kg  Height: 5\' 2"  (1.575 m)   No intake or output data in the 24 hours ending 03/07/18 1808 Last 3 Weights 03/07/2018 08/15/2014 04/16/2014  Weight (lbs) 126 lb 1.6 oz 108 lb 8 oz 122 lb 3.2 oz  Weight (kg) 57.199 kg 49.215 kg 55.43 kg     Body mass index is 23.06 kg/m.  General:   Middle-aged female, no acute distress HEENT: normal Lymph: no adenopathy Neck: Elevated JVD Endocrine:  No thryomegaly Vascular: No carotid bruits; FA pulses 2+ bilaterally without bruits  Cardiac: Regular rate S1-S2.  Soft systolic murmur Lungs: Clear to  auscultation Abd: soft, nontender, no hepatomegaly  Ext: no edema Musculoskeletal:  No deformities, BUE and BLE strength normal and equal Skin: warm and dry  Neuro:  CNs 2-12 intact, no focal abnormalities noted Psych:  Normal affect   EKG:  The EKG was personally reviewed and demonstrates:   Telemetry:  Telemetry was personally reviewed and demonstrates:     Relevant CV Studies:   Laboratory Data:  ChemistryNo results for input(s): NA, K, CL, CO2, GLUCOSE, BUN, CREATININE, CALCIUM, GFRNONAA, GFRAA, ANIONGAP in the last 168 hours.  No results for input(s): PROT, ALBUMIN, AST, ALT, ALKPHOS, BILITOT in the last 168 hours. HematologyNo results for input(s): WBC, RBC, HGB, HCT, MCV, MCH, MCHC, RDW, PLT in the last 168 hours. Cardiac EnzymesNo results for input(s): TROPONINI in the last 168 hours. No results for input(s): TROPIPOC in the last 168 hours.  BNPNo results for input(s): BNP, PROBNP in the last 168 hours.  DDimer No results for input(s): DDIMER in the last 168 hours.  Radiology/Studies:  No results found.  Assessment and Plan:   1. Acute systolic congestive heart failure::   Braxton presents with symptoms of severe shortness of breath.  Echocardiogram reveals an ejection fraction of 40 to 45%.  She has moderate pulmonary hypertension with estimated PA pressure of 51.  She denies having any episodes of angina.  She does have some chest wall pain from coughing so much.  Troponin levels really are not consistent with an acute coronary syndrome but appear to be more consistent with demand ischemia from congestive heart failure.  In addition, she has some degree of right ventricular strain with PA pressures of 51.  Given the fact that her ejection fraction is decreased, I would like to perform a right and left heart catheterization on her tomorrow.  We discussed the risks, benefits, options.  She understands and agrees to proceed.  3.  Elevated troponin level: Her troponin  levels are very minimally elevated but the trend is flat.  This is more consistent with demand ischemia from her congestive heart failure.  Is not necessarily consistent with an acute coronary syndrome.  For questions or updates, please contact [place] Please consult [email or web address] for contact info under     Signed, Mertie Moores, MD  03/07/2018 6:08 PM

## 2018-03-07 NOTE — H&P (View-Only) (Signed)
Cardiology Consultation:   Patient ID: Mary Franco MRN: [id number]; DOB: 01-09-1966  Admit date: 03/07/2018 Date of Consult: 03/07/2018  Primary Care Provider: No primary care provider on file. Primary Cardiologist: Azavier Creson  Primary Electrophysiologist:      Patient Profile:   Mary Franco is a 53 y.o. female with a hx of anxiety, gastroesophageal reflux disease, tobacco abuse who is being seen today for the evaluation of worsening shortness of breath and possible congestive heart failure at the request of  Dr. Maylene Roes  History of Present Illness:   Mary Franco is a 53 year old female who was transferred from [facility] for further evaluation of shortness of breath and presumed congestive heart failure. She presented to the emergency room with a day of generalized fatigue and shortness of breath.  She had recently been diagnosed with bronchitis.  She is had markedly reduced exercise capacity.  She gets short of breath walking across the room. He does not pay attention to her diet.  She eats a fairly salty diet. Does not get any regular exercise. Most 1 pack of cigarettes a day for the past 40 years.  She was seen in the [place] ER in the troponin level was 0.06.  proBNP was 2820.     Past Medical History:  Diagnosis Date  . Alcohol abuse   . Anxiety   . Asthma   . Cervical strain 04/16/2014  . Cervicogenic headache 04/16/2014  . COPD (chronic obstructive pulmonary disease)   . Fibromyalgia   . Migraines     Past Surgical History:  Procedure Laterality Date  . APPENDECTOMY    . KNEE SURGERY Left   . TUBAL LIGATION       Home Medications:  Prior to Admission medications   Medication Sig Start Date End Date Taking? Authorizing Provider  albuterol (PROVENTIL) (2.5 MG/3ML) 0.083% nebulizer solution Inhale 3 mLs into the lungs every 4 (four) hours. 03/06/18   [provider]  alprazolam Duanne Moron) 2 MG tablet Take 2 mg by mouth at bedtime as needed.     [provider]  atenolol (TENORMIN) 25 MG tablet Take 25 mg by mouth daily. 03/06/18   [provider]  benzonatate (TESSALON) 100 MG capsule Take 100 mg by mouth every 4 (four) hours as needed for cough. For ten days. Do not exceed 600mg  per day. 02/27/18   [provider]  doxycycline (VIBRA-TABS) 100 MG tablet Take 100 mg by mouth 2 (two) times daily. For ten days Started on 02-27-18 02/27/18   [provider]  gabapentin (NEURONTIN) 300 MG capsule Take 1 capsule (300 mg total) by mouth 2 (two) times daily. 05/29/14   Kathrynn Ducking, MD  potassium chloride (MICRO-K) 10 MEQ CR capsule Take 10 mEq by mouth daily. 03/06/18   [provider]  PROAIR HFA 108 (90 BASE) MCG/ACT inhaler as needed. 01/31/14   [provider]  promethazine (PHENERGAN) 25 MG tablet Take 1 tablet (25 mg total) by mouth every 8 (eight) hours as needed for nausea. 02/12/12   Milus Banister, MD  promethazine (PHENERGAN) 50 MG tablet Take 1 tablet (50 mg total) by mouth 2 (two) times daily. 08/07/11 08/14/11  Milus Banister, MD  [facility] Name: melatonin 3mg  caps.  Taking 2 caps po qhs.    [provider]    Inpatient Medications: Scheduled Meds:  Continuous Infusions:  PRN Meds:   Allergies:    Allergies  Allergen Reactions  . Codeine   .  Sulfa Antibiotics     Social History:   Social History   Socioeconomic History  . Marital status: Divorced    Spouse name: Not on file  . Number of children: 2  . Years of education: Not on file  . Highest education level: Not on file  Occupational History  . Occupation: Copy  . Financial resource strain: Not on file  . Food insecurity:    Worry: Not on file    Inability: Not on file  . Transportation needs:    Medical: Not on file    Non-medical: Not on file  Tobacco Use  . Smoking status: Current Every Day Smoker    Packs/day: 1.00  . Smokeless tobacco: Never Used    Substance and Sexual Activity  . Alcohol use: Yes    Comment: 2 x per week  . Drug use: No  . Sexual activity: Not on file  Lifestyle  . Physical activity:    Days per week: Not on file    Minutes per session: Not on file  . Stress: Not on file  Relationships  . Social connections:    Talks on phone: Not on file    Gets together: Not on file    Attends religious service: Not on file    Active member of club or organization: Not on file    Attends meetings of clubs or organizations: Not on file    Relationship status: Not on file  . Intimate partner violence:    Fear of current or ex partner: Not on file    Emotionally abused: Not on file    Physically abused: Not on file    Forced sexual activity: Not on file  Other Topics Concern  . Not on file  Social History Narrative   ** Merged History Encounter **   Patient is right handed.   Patient drinks 3 cups caffeine daily.   Working on 2nd shift now at her job.  08-15-14           Family History:    Family History  Problem Relation Age of Onset  . Colitis Paternal Aunt   . Ovarian cancer Maternal Grandmother   . Cancer Mother      ROS:  Please see the history of present illness.   All other ROS reviewed and negative.     Physical Exam/Data:   Vitals:   03/07/18 1741  BP: 122/82  Pulse: 82  Resp: 18  Temp: (!) 97.5 F (36.4 C)  TempSrc: Oral  SpO2: 98%  Weight: 57.2 kg  Height: 5\' 2"  (1.575 m)   No intake or output data in the 24 hours ending 03/07/18 1808 Last 3 Weights 03/07/2018 08/15/2014 04/16/2014  Weight (lbs) 126 lb 1.6 oz 108 lb 8 oz 122 lb 3.2 oz  Weight (kg) 57.199 kg 49.215 kg 55.43 kg     Body mass index is 23.06 kg/m.  General:   Middle-aged female, no acute distress HEENT: normal Lymph: no adenopathy Neck: Elevated JVD Endocrine:  No thryomegaly Vascular: No carotid bruits; FA pulses 2+ bilaterally without bruits  Cardiac: Regular rate S1-S2.  Soft systolic murmur Lungs: Clear to  auscultation Abd: soft, nontender, no hepatomegaly  Ext: no edema Musculoskeletal:  No deformities, BUE and BLE strength normal and equal Skin: warm and dry  Neuro:  CNs 2-12 intact, no focal abnormalities noted Psych:  Normal affect   EKG:  The EKG was personally reviewed and demonstrates:   Telemetry:  Telemetry was personally reviewed and demonstrates:     Relevant CV Studies:   Laboratory Data:  ChemistryNo results for input(s): NA, K, CL, CO2, GLUCOSE, BUN, CREATININE, CALCIUM, GFRNONAA, GFRAA, ANIONGAP in the last 168 hours.  No results for input(s): PROT, ALBUMIN, AST, ALT, ALKPHOS, BILITOT in the last 168 hours. HematologyNo results for input(s): WBC, RBC, HGB, HCT, MCV, MCH, MCHC, RDW, PLT in the last 168 hours. Cardiac EnzymesNo results for input(s): TROPONINI in the last 168 hours. No results for input(s): TROPIPOC in the last 168 hours.  BNPNo results for input(s): BNP, PROBNP in the last 168 hours.  DDimer No results for input(s): DDIMER in the last 168 hours.  Radiology/Studies:  No results found.  Assessment and Plan:   1. Acute systolic congestive heart failure::   Mary Franco presents with symptoms of severe shortness of breath.  Echocardiogram reveals an ejection fraction of 40 to 45%.  She has moderate pulmonary hypertension with estimated PA pressure of 51.  She denies having any episodes of angina.  She does have some chest wall pain from coughing so much.  Troponin levels really are not consistent with an acute coronary syndrome but appear to be more consistent with demand ischemia from congestive heart failure.  In addition, she has some degree of right ventricular strain with PA pressures of 51.  Given the fact that her ejection fraction is decreased, I would like to perform a right and left heart catheterization on her tomorrow.  We discussed the risks, benefits, options.  She understands and agrees to proceed.  3.  Elevated troponin level: Her troponin  levels are very minimally elevated but the trend is flat.  This is more consistent with demand ischemia from her congestive heart failure.  Is not necessarily consistent with an acute coronary syndrome.  For questions or updates, please contact [place] Please consult [email or web address] for contact info under     Signed, Mertie Moores, MD  03/07/2018 6:08 PM

## 2018-03-07 NOTE — Plan of Care (Signed)

## 2018-03-07 NOTE — H&P (Signed)
History and Physical    Mary Franco:[id number] DOB: [birth year] DOA: 03/07/2018  PCP: No primary care provider on file.  Patient coming from: [facility]   Chief Complaint: SOB   HPI: Mary Franco is a 53 year old Caucasian female with past medical history of GERD, anxiety and tobacco abuse who presented to the emergency department due to 1 day of generalized and shortness of breath. Patient said that she was recently diagnosed with bronchitis and was placed on medication. However, she continues to have chest congestion and nasal congestion with dry cough with inability to be able to bring up any sputum. She also complained of worsening generalized weakness. Patient said she was recently started on atenolol due to hypertension and albuterol due to suspected COPD at the ED, she returned complaining of worsening weakness and shortness of breath on exertion, she complained of soreness which was reproducible on left sternal area, however she denies orthopnea, PND, leg swelling or any significant increased weight gain recently. In the emergency department, vital signs were normal except for BP at 144/81. Workup in the ED showed hypokalemia, hypocalcemia, hypomagnesemia, troponin x1 0.06, proBNP 2820. Chest x-ray showed no focal consolidation. Aspirin, breathing treatment and IV magnesium was given. Hospitalist was called to admit patient for further evaluation and management. She was treated at [facility] due acute bronchitis and was further determined to have new onset systolic congestive heart failure.  Cardiology was consulted, patient was referred for transfer to [facility] for cardiac cath/ischemic evaluation.  Echo from Benjamin Perez: 1. There is mild global hypokinesis of LV . 2. Overall left ventricular systolic function is mild to moderately impaired with, an EF between 40 - 45 %. 3. The right ventricle is normal in size and function. 4. Left atrium is mildly dilated  by volume. 5. There appears to be shunting at atrial level on color doppler which is suggestive of a PFO/small ASD (recommend saline contrast study if clinically indicated) 6. Mild mitral regurgitation is present. 7. Moderate tricuspid regurgitation present. The right ventricular systolic pressure 8. The right ventricular systolic pressure, as measured by Doppler, is 22mmHg suggestive of moderate pulmonary hypertension   Review of Systems: As per HPI otherwise 10 point review of systems negative.   Past Medical History:  Diagnosis Date  . Alcohol abuse   . Anxiety   . Asthma   . Cervical strain 04/16/2014  . Cervicogenic headache 04/16/2014  . COPD (chronic obstructive pulmonary disease)   . Fibromyalgia   . Migraines     Past Surgical History:  Procedure Laterality Date  . APPENDECTOMY    . KNEE SURGERY Left   . TUBAL LIGATION       reports that she has been smoking. She has been smoking about 1.00 pack per day. She has never used smokeless tobacco. She reports current alcohol use. She reports that she does not use drugs.  Allergies  Allergen Reactions  . Codeine   . Sulfa Antibiotics     Family History  Problem Relation Age of Onset  . Colitis Paternal Aunt   . Ovarian cancer Maternal Grandmother   . Cancer Mother     Prior to Admission medications   Medication Sig Start Date End Date Taking? Authorizing Provider  alprazolam Duanne Moron) 2 MG tablet Take 2 mg by mouth at bedtime as needed.    [provider]  diclofenac (VOLTAREN) 75 MG EC tablet Take 1 tablet (75 mg total) by mouth 2 (two) times daily. Patient not  taking: Reported on 05/10/2014 04/23/14   Kathrynn Ducking, MD  gabapentin (NEURONTIN) 300 MG capsule Take 1 capsule (300 mg total) by mouth 2 (two) times daily. 05/29/14   Kathrynn Ducking, MD  PROAIR HFA 108 [phone] BASE) MCG/ACT inhaler as needed. 01/31/14   [provider]  promethazine (PHENERGAN) 25 MG tablet Take 1 tablet (25 mg total) by  mouth every 8 (eight) hours as needed for nausea. 02/12/12   Milus Banister, MD  promethazine (PHENERGAN) 50 MG tablet Take 1 tablet (50 mg total) by mouth 2 (two) times daily. 08/07/11 08/14/11  Milus Banister, MD  [place] [facility] Name: melatonin 3mg  caps.  Taking 2 caps po qhs.    [provider]    Physical Exam: Vitals:   03/07/18 1741  BP: 122/82  Pulse: 82  Resp: 18  Temp: (!) 97.5 F (36.4 C)  TempSrc: Oral  SpO2: 98%  Weight: 57.2 kg  Height: 5\' 2"  (1.575 m)     Constitutional: NAD, calm, comfortable Eyes: PERRL, lids and conjunctivae normal ENMT: Mucous membranes are moist. Posterior pharynx clear of any exudate or lesions.Normal dentition.  Neck: normal, supple, no masses, no thyromegaly Respiratory: Diminished breath sounds bilaterally, no wheeze or rhonchi.  On nasal cannula O2, no conversational dyspnea Cardiovascular: Regular rate and rhythm, no murmurs / rubs / gallops. No extremity edema.  Abdomen: no tenderness, no masses palpated. No hepatosplenomegaly. Bowel sounds positive.  Musculoskeletal: no clubbing / cyanosis. No joint deformity upper and lower extremities. Good ROM, no contractures. Normal muscle tone.  Skin: no rashes, lesions, ulcers. No induration Neurologic: CN 2-12 grossly intact. Strength 5/5 in all 4.  Speech clear Psychiatric: Normal judgment and insight. Alert and oriented x 3. Normal mood.   Labs on Admission: I have personally reviewed following labs and imaging studies  CBC: No results for input(s): WBC, NEUTROABS, HGB, HCT, MCV, PLT in the last 168 hours. Basic Metabolic Panel: No results for input(s): NA, K, CL, CO2, GLUCOSE, BUN, CREATININE, CALCIUM, MG, PHOS in the last 168 hours. GFR: CrCl cannot be calculated (Patient's most recent lab result is older than the maximum 21 days allowed.). Liver Function Tests: No results for input(s): AST, ALT, ALKPHOS, BILITOT, PROT, ALBUMIN in the last 168 hours. No results for  input(s): LIPASE, AMYLASE in the last 168 hours. No results for input(s): AMMONIA in the last 168 hours. Coagulation Profile: No results for input(s): INR, PROTIME in the last 168 hours. Cardiac Enzymes: No results for input(s): CKTOTAL, CKMB, CKMBINDEX, TROPONINI in the last 168 hours. BNP (last 3 results) No results for input(s): PROBNP in the last 8760 hours. HbA1C: No results for input(s): HGBA1C in the last 72 hours. CBG: No results for input(s): GLUCAP in the last 168 hours. Lipid Profile: No results for input(s): CHOL, HDL, LDLCALC, TRIG, CHOLHDL, LDLDIRECT in the last 72 hours. Thyroid Function Tests: No results for input(s): TSH, T4TOTAL, FREET4, T3FREE, THYROIDAB in the last 72 hours. Anemia Panel: No results for input(s): VITAMINB12, FOLATE, FERRITIN, TIBC, IRON, RETICCTPCT in the last 72 hours. Urine analysis: No results found for: COLORURINE, APPEARANCEUR, LABSPEC, PHURINE, GLUCOSEU, HGBUR, BILIRUBINUR, KETONESUR, PROTEINUR, UROBILINOGEN, NITRITE, LEUKOCYTESUR Sepsis Labs: !!!!!!!!!!!!!!!!!!!!!!!!!!!!!!!!!!!!!!!!!!!! @LABRCNTIP (procalcitonin:4,lacticidven:4) )No results found for this or any previous visit (from the past 240 hour(s)).   Radiological Exams on Admission: No results found.   Assessment/Plan Principal Problem:   Acute systolic CHF (congestive heart failure) (HCC) Active Problems:   Acute hypoxemic respiratory failure (HCC)   Acute bronchitis  Tobacco abuse   Elevated troponin   New onset systolic heart failure Newly diagnosed at [facility], patient with continued exertional dyspnea proBNP 2820 at [facility] Echocardiogram with global LV hypokinesis, EF 40 to 45% Cardiology consulted Continue IV Lasix Strict I's and O's Daily weights  Acute hypoxemic respiratory failure Does not require nasal cannula O2 at baseline Continue to wean to room air as able  Acute bronchitis Doxycycline, prednisone, breathing treatments  Mildly elevated  troponin Troponin 0 0.063 at [facility]  Alcohol abuse Last alcohol drink was over 1 week ago  Tobacco abuse Cessation counseling  Anxiety Xanax prn    DVT prophylaxis: Lovenox  Code Status: Full  Family Communication: At bedside Disposition Plan: Pending cardiology evaluation Consults called: Cardiology consulted  Admission status: Inpatient    Dessa Phi, DO [facility] 03/07/2018, 6:19 PM

## 2018-03-08 ENCOUNTER — Other Ambulatory Visit: Payer: Self-pay

## 2018-03-08 ENCOUNTER — Encounter (HOSPITAL_COMMUNITY)
Admission: AD | Disposition: A | Discharge status: Home / Self Care | Payer: Self-pay | Source: Other Acute Inpatient Hospital | Attending: Internal Medicine

## 2018-03-08 ENCOUNTER — Encounter (HOSPITAL_COMMUNITY): Payer: Self-pay

## 2018-03-08 DIAGNOSIS — I5031 Acute diastolic (congestive) heart failure: Secondary | ICD-10-CM

## 2018-03-08 HISTORY — PX: RIGHT/LEFT HEART CATH AND CORONARY ANGIOGRAPHY: CATH118266

## 2018-03-08 LAB — CBC
HCT: 39.9 % (ref 36.0–46.0)
Hemoglobin: 12.7 g/dL (ref 12.0–15.0)
MCH: 31.2 pg (ref 26.0–34.0)
MCHC: 31.8 g/dL (ref 30.0–36.0)
MCV: 98 fL (ref 80.0–100.0)
Platelets: 389 10*3/uL (ref 150–400)
RBC: 4.07 MIL/uL (ref 3.87–5.11)
RDW: 12.8 % (ref 11.5–15.5)
WBC: 9.1 10*3/uL (ref 4.0–10.5)
nRBC: 0 % (ref 0.0–0.2)

## 2018-03-08 LAB — BASIC METABOLIC PANEL
Anion gap: 10 (ref 5–15)
BUN: 15 mg/dL (ref 6–20)
CO2: 31 mmol/L (ref 22–32)
Calcium: 9.1 mg/dL (ref 8.9–10.3)
Chloride: 98 mmol/L (ref 98–111)
Creatinine, Ser: 0.93 mg/dL (ref 0.44–1.00)
GFR calc Af Amer: >60 mL/min (ref >60)
GFR calc non Af Amer: >60 mL/min (ref >60)
GLUCOSE: 114 mg/dL — AB (ref 70–99)
Potassium: 3.3 mmol/L — ABNORMAL LOW (ref 3.5–5.1)
Sodium: 139 mmol/L (ref 135–145)

## 2018-03-08 LAB — HIV ANTIBODY (ROUTINE TESTING W REFLEX): HIV Screen 4th Generation wRfx: NONREACTIVE

## 2018-03-08 SURGERY — RIGHT/LEFT HEART CATH AND CORONARY ANGIOGRAPHY
Anesthesia: LOCAL

## 2018-03-08 MED ORDER — NITROGLYCERIN 1 MG/10 ML FOR IR/CATH LAB
INTRA_ARTERIAL | Status: AC
  Filled 2018-03-08: qty 10

## 2018-03-08 MED ORDER — ASPIRIN 81 MG PO CHEW
81.0000 mg | CHEWABLE_TABLET | Freq: Every day | ORAL | Status: DC
  Administered 2018-03-09: 81 mg via ORAL
  Filled 2018-03-08: qty 1

## 2018-03-08 MED ORDER — ONDANSETRON HCL 4 MG/2ML IJ SOLN: 4.0000 mg | Freq: Four times a day (QID) | INTRAMUSCULAR | Status: DC | PRN

## 2018-03-08 MED ORDER — SODIUM CHLORIDE 0.9% FLUSH
3.0000 mL | Freq: Two times a day (BID) | INTRAVENOUS | Status: DC
  Administered 2018-03-08: 3 mL via INTRAVENOUS

## 2018-03-08 MED ORDER — VERAPAMIL HCL 2.5 MG/ML IV SOLN
INTRA_ARTERIAL | Status: DC | PRN
  Administered 2018-03-08: 10 mL via INTRA_ARTERIAL

## 2018-03-08 MED ORDER — IPRATROPIUM-ALBUTEROL 0.5-2.5 (3) MG/3ML IN SOLN
3.0000 mL | Freq: Two times a day (BID) | RESPIRATORY_TRACT | Status: DC
  Administered 2018-03-09: 3 mL via RESPIRATORY_TRACT
  Filled 2018-03-08: qty 3

## 2018-03-08 MED ORDER — HEPARIN (PORCINE) IN NACL 1000-0.9 UT/500ML-% IV SOLN
INTRAVENOUS | Status: DC | PRN
  Administered 2018-03-08 (×2): 500 mL

## 2018-03-08 MED ORDER — SODIUM CHLORIDE 0.9 % IV SOLN: INTRAVENOUS | Status: AC

## 2018-03-08 MED ORDER — FENTANYL CITRATE (PF) 100 MCG/2ML IJ SOLN
INTRAMUSCULAR | Status: AC
  Filled 2018-03-08: qty 2

## 2018-03-08 MED ORDER — MIDAZOLAM HCL 2 MG/2ML IJ SOLN
INTRAMUSCULAR | Status: DC | PRN
  Administered 2018-03-08 (×2): 1 mg via INTRAVENOUS

## 2018-03-08 MED ORDER — IOHEXOL 350 MG/ML SOLN
INTRAVENOUS | Status: DC | PRN
  Administered 2018-03-08: 40 mL via INTRA_ARTERIAL

## 2018-03-08 MED ORDER — HEPARIN SODIUM (PORCINE) 1000 UNIT/ML IJ SOLN
INTRAMUSCULAR | Status: DC | PRN
  Administered 2018-03-08: 3000 [IU] via INTRAVENOUS

## 2018-03-08 MED ORDER — POTASSIUM CHLORIDE CRYS ER 20 MEQ PO TBCR
40.0000 meq | EXTENDED_RELEASE_TABLET | Freq: Once | ORAL | Status: AC
  Administered 2018-03-08: 40 meq via ORAL
  Filled 2018-03-08: qty 2

## 2018-03-08 MED ORDER — POTASSIUM CHLORIDE CRYS ER 20 MEQ PO TBCR
20.0000 meq | EXTENDED_RELEASE_TABLET | Freq: Two times a day (BID) | ORAL | Status: DC
  Administered 2018-03-08: 20 meq via ORAL
  Filled 2018-03-08: qty 1

## 2018-03-08 MED ORDER — LIDOCAINE HCL (PF) 1 % IJ SOLN
INTRAMUSCULAR | Status: DC | PRN
  Administered 2018-03-08: 5 mL

## 2018-03-08 MED ORDER — NICOTINE 14 MG/24HR TD PT24
14.0000 mg | MEDICATED_PATCH | Freq: Every day | TRANSDERMAL | Status: DC
  Administered 2018-03-08 – 2018-03-09 (×2): 14 mg via TRANSDERMAL
  Filled 2018-03-08 (×2): qty 1

## 2018-03-08 MED ORDER — SODIUM CHLORIDE 0.9% FLUSH: 3.0000 mL | INTRAVENOUS | Status: DC | PRN

## 2018-03-08 MED ORDER — HEPARIN (PORCINE) IN NACL 1000-0.9 UT/500ML-% IV SOLN
INTRAVENOUS | Status: AC
  Filled 2018-03-08: qty 1000

## 2018-03-08 MED ORDER — LIDOCAINE HCL (PF) 1 % IJ SOLN
INTRAMUSCULAR | Status: AC
  Filled 2018-03-08: qty 30

## 2018-03-08 MED ORDER — FENTANYL CITRATE (PF) 100 MCG/2ML IJ SOLN
INTRAMUSCULAR | Status: DC | PRN
  Administered 2018-03-08 (×2): 25 ug via INTRAVENOUS

## 2018-03-08 MED ORDER — VERAPAMIL HCL 2.5 MG/ML IV SOLN
INTRAVENOUS | Status: AC
  Filled 2018-03-08: qty 2

## 2018-03-08 MED ORDER — MIDAZOLAM HCL 2 MG/2ML IJ SOLN
INTRAMUSCULAR | Status: AC
  Filled 2018-03-08: qty 2

## 2018-03-08 MED ORDER — ACETAMINOPHEN 325 MG PO TABS: 650.0000 mg | ORAL_TABLET | ORAL | Status: DC | PRN

## 2018-03-08 MED ORDER — SODIUM CHLORIDE 0.9 % IV SOLN: 250.0000 mL | INTRAVENOUS | Status: DC | PRN

## 2018-03-08 MED ORDER — IPRATROPIUM-ALBUTEROL 0.5-2.5 (3) MG/3ML IN SOLN
3.0000 mL | Freq: Three times a day (TID) | RESPIRATORY_TRACT | Status: DC
  Administered 2018-03-08 (×3): 3 mL via RESPIRATORY_TRACT
  Filled 2018-03-08 (×3): qty 3

## 2018-03-08 SURGICAL SUPPLY — 13 items
CATH BALLN WEDGE 5F 110CM (CATHETERS) ×1 IMPLANT
CATH INFINITI JR4 5F (CATHETERS) ×1 IMPLANT
CATH OPTITORQUE TIG 4.0 5F (CATHETERS) ×1 IMPLANT
DEVICE RAD COMP TR BAND LRG (VASCULAR PRODUCTS) ×1 IMPLANT
GLIDESHEATH SLEND A-KIT 6F 22G (SHEATH) ×1 IMPLANT
GUIDEWIRE INQWIRE 1.5J.035X260 (WIRE) IMPLANT
INQWIRE 1.5J .035X260CM (WIRE) ×2
KIT HEART LEFT (KITS) ×2 IMPLANT
PACK CARDIAC CATHETERIZATION (CUSTOM PROCEDURE TRAY) ×2 IMPLANT
SHEATH GLIDE SLENDER 4/5FR (SHEATH) ×1 IMPLANT
TRANSDUCER W/STOPCOCK (MISCELLANEOUS) ×2 IMPLANT
TUBING CIL FLEX 10 FLL-RA (TUBING) ×2 IMPLANT
WIRE HI TORQ VERSACORE-J 145CM (WIRE) ×1 IMPLANT

## 2018-03-08 NOTE — Progress Notes (Signed)
Unable to capture v/s as standard as patient is not leaving b/p cuff in place It has been removed x 3 by patient since returning to unit Advised patient to plz leave cuff to arm so that b/p can be captured

## 2018-03-08 NOTE — Interval H&P Note (Signed)
Cath Lab Visit (complete for each Cath Lab visit)  Clinical Evaluation Leading to the Procedure:   ACS: No.  Non-ACS:    Anginal Classification: CCS II  Anti-ischemic medical therapy: No Therapy  Non-Invasive Test Results: No non-invasive testing performed  Prior CABG: No previous CABG      History and Physical Interval Note:  03/08/2018 9:23 AM  Mary Franco  has presented today for surgery, with the diagnosis of HF  The various methods of treatment have been discussed with the patient and family. After consideration of risks, benefits and other options for treatment, the patient has consented to  Procedure(s): RIGHT/LEFT HEART CATH AND CORONARY ANGIOGRAPHY (N/A) as a surgical intervention .  The patient's history has been reviewed, patient examined, no change in status, stable for surgery.  I have reviewed the patient's chart and labs.  Questions were answered to the patient's satisfaction.     Quay Burow

## 2018-03-08 NOTE — Progress Notes (Signed)
TR band off Right radial level 0 Gauze with tegaderm applied Will observe

## 2018-03-08 NOTE — Progress Notes (Signed)
Patient returned to unit from cath lab awake and alert. No discomfort reported. Right wrist and right brachial level 0.   Patient taking PO well.

## 2018-03-08 NOTE — Progress Notes (Addendum)
PROGRESS NOTE    Mary Franco  VPX:[id number] DOB: 1966/01/30 DOA: 03/07/2018 PCP: Patient, No Pcp Per     Brief Narrative:  Mary Franco is a 53 year old Caucasian female with past medical history of GERD, anxiety and tobacco abuse who presented to the emergency department due to 1 day of generalized and shortness of breath. Patient said that she was recently diagnosed with bronchitis and was placed on medication. However, she continues to have chest congestion and nasal congestion with dry cough with inability to be able to bring up any sputum. She also complained of worsening generalized weakness. Patient said she was recently started on atenolol due to hypertension and albuterol due to suspected COPD at the ED, she returned complaining of worsening weakness and shortness of breath on exertion, she complained of soreness which was reproducible on left sternal area, however she denies orthopnea, PND, leg swelling or any significant increased weight gain recently. In the emergency department, vital signs were normal except for BP at 144/81. Workup in the ED showed hypokalemia, hypocalcemia, hypomagnesemia, troponin x1 0.06, proBNP 2820. Chest x-ray showed no focal consolidation. Aspirin, breathing treatment and IV magnesium was given. Hospitalist was called to admit patient for further evaluation and management. She was treated at [facility] due acute bronchitis and was further determined to have new onset systolic congestive heart failure.  Cardiology was consulted, patient was referred for transfer to [facility] for cardiac cath/ischemic evaluation.  New events last 24 hours / Subjective: Patient evaluated by cardiology, plan for heart cath later this morning.  Continues to have some coughing, overall breathing has improved  Assessment & Plan:   Principal Problem:   Acute systolic CHF (congestive heart failure) (HCC) Active Problems:   Acute hypoxemic respiratory failure  (HCC)   Acute bronchitis   Tobacco abuse   Elevated troponin   New onset systolic heart failure Newly diagnosed at [facility], patient with continued exertional dyspnea proBNP 2820 at [facility] Echocardiogram with global LV hypokinesis, EF 40 to 45% Cardiology consulted Continue IV Lasix Strict I's and O's Daily weights Planning for heart cath today  Acute hypoxemic respiratory failure Does not require nasal cannula O2 at baseline Continue to wean to room air as able  Acute bronchitis Doxycycline, prednisone, breathing treatments  Mildly elevated troponin Troponin 0 0.063 at [facility]  Alcohol abuse Last alcohol drink was over 1 week ago  Tobacco abuse Cessation counseling  Anxiety Xanax prn   Hypokalemia Replace, trend   DVT prophylaxis: Lovenox Code Status: Full code Family Communication: No family at bedside Disposition Plan: Pending heart cath later today   Consultants:   Cardiology  Procedures:   None  Antimicrobials:  Anti-infectives (From admission, onward)   Start     Dose/Rate Route Frequency Ordered Stop   03/07/18 2200  [MAR Hold]  doxycycline (VIBRA-TABS) tablet 100 mg     (MAR Hold since Tue 03/08/2018 at 0912. Reason: Transfer to a Procedural area.)   100 mg Oral Every 12 hours 03/07/18 1817          Objective: Vitals:   03/07/18 2154 03/08/18 0445 03/08/18 0833 03/08/18 0940  BP:  125/89 (!) 144/83   Pulse:  77 80   Resp:  16 16   Temp:  (!) 97.4 F (36.3 C) 97.6 F (36.4 C)   TempSrc:  Oral Oral   SpO2: 93% 93% 96% 98%  Weight:  56.2 kg    Height:  Intake/Output Summary (Last 24 hours) at 03/08/2018 1014 Last data filed at 03/08/2018 0600 Gross per 24 hour  Intake 428.9 ml  Output -  Net 428.9 ml   Filed Weights   03/07/18 1741 03/08/18 0445  Weight: 57.2 kg 56.2 kg    Examination:  General exam: Appears calm and comfortable  Respiratory system: Clear to auscultation. Respiratory effort  normal.  Minimal wheezes bibasilar Cardiovascular system: S1 & S2 heard, RRR. No JVD, murmurs, rubs, gallops or clicks. No pedal edema. Gastrointestinal system: Abdomen is nondistended, soft and nontender. No organomegaly or masses felt. Normal bowel sounds heard. Central nervous system: Alert and oriented. No focal neurological deficits. Extremities: Symmetric 5 x 5 power. Skin: No rashes, lesions or ulcers Psychiatry: Judgement and insight appear normal. Mood & affect appropriate.   Data Reviewed: I have personally reviewed following labs and imaging studies  CBC: Recent Labs  Lab 03/07/18 1953 03/08/18 0443  WBC 7.3 9.1  HGB 13.5 12.7  HCT 42.7 39.9  MCV 98.2 98.0  PLT 422* 182   Basic Metabolic Panel: Recent Labs  Lab 03/07/18 1953 03/08/18 0443  NA 139 139  K 3.7 3.3*  CL 98 98  CO2 26 31  GLUCOSE 180* 114*  BUN 11 15  CREATININE 0.95 0.93  CALCIUM 9.3 9.1   GFR: Estimated Creatinine Clearance: 54.7 mL/min (by C-G formula based on SCr of 0.93 mg/dL). Liver Function Tests: No results for input(s): AST, ALT, ALKPHOS, BILITOT, PROT, ALBUMIN in the last 168 hours. No results for input(s): LIPASE, AMYLASE in the last 168 hours. No results for input(s): AMMONIA in the last 168 hours. Coagulation Profile: No results for input(s): INR, PROTIME in the last 168 hours. Cardiac Enzymes: No results for input(s): CKTOTAL, CKMB, CKMBINDEX, TROPONINI in the last 168 hours. BNP (last 3 results) No results for input(s): PROBNP in the last 8760 hours. HbA1C: No results for input(s): HGBA1C in the last 72 hours. CBG: No results for input(s): GLUCAP in the last 168 hours. Lipid Profile: No results for input(s): CHOL, HDL, LDLCALC, TRIG, CHOLHDL, LDLDIRECT in the last 72 hours. Thyroid Function Tests: No results for input(s): TSH, T4TOTAL, FREET4, T3FREE, THYROIDAB in the last 72 hours. Anemia Panel: No results for input(s): VITAMINB12, FOLATE, FERRITIN, TIBC, IRON, RETICCTPCT  in the last 72 hours. Sepsis Labs: No results for input(s): PROCALCITON, LATICACIDVEN in the last 168 hours.  No results found for this or any previous visit (from the past 240 hour(s)).     Radiology Studies: No results found.    Scheduled Meds: . [MAR Hold] doxycycline  100 mg Oral Q12H  . [MAR Hold] enoxaparin (LOVENOX) injection  40 mg Subcutaneous Q24H  . [MAR Hold] furosemide  40 mg Intravenous Daily  . [MAR Hold] gabapentin  300 mg Oral BID  . [MAR Hold] ipratropium-albuterol  3 mL Nebulization TID  . [MAR Hold] nicotine  14 mg Transdermal Daily  . [MAR Hold] predniSONE  50 mg Oral Q breakfast  . [MAR Hold] sodium chloride flush  3 mL Intravenous Q12H  . sodium chloride flush  3 mL Intravenous Q12H   Continuous Infusions: . [MAR Hold] sodium chloride    . sodium chloride    . sodium chloride 1 mL/kg/hr (03/08/18 0548)     LOS: 1 day    Time spent: 20 minutes   Dessa Phi, DO [facility] [email or web address] 03/08/2018, 10:14 AM

## 2018-03-08 NOTE — Progress Notes (Signed)
Progress Note  Patient Name: Mary Franco Date of Encounter: 03/08/2018  Primary Cardiologist: Mertie Moores, MD   Subjective   Patient is feeling better.  Heart cath today did not reveal any critical coronary lesions.  She had elevated end-diastolic filling pressures.  Talking with Mary Franco, she eats quite a bit of salt.  Inpatient Medications    Scheduled Meds: . [START ON 03/09/2018] aspirin  81 mg Oral Daily  . doxycycline  100 mg Oral Q12H  . enoxaparin (LOVENOX) injection  40 mg Subcutaneous Q24H  . furosemide  40 mg Intravenous Daily  . gabapentin  300 mg Oral BID  . ipratropium-albuterol  3 mL Nebulization TID  . nicotine  14 mg Transdermal Daily  . potassium chloride  20 mEq Oral BID  . predniSONE  50 mg Oral Q breakfast  . sodium chloride flush  3 mL Intravenous Q12H  . sodium chloride flush  3 mL Intravenous Q12H   Continuous Infusions: . sodium chloride    . sodium chloride     PRN Meds: sodium chloride, sodium chloride, acetaminophen, albuterol, alprazolam, benzonatate, ondansetron (ZOFRAN) IV, ondansetron **OR** [DISCONTINUED] ondansetron (ZOFRAN) IV, sodium chloride flush, sodium chloride flush   Vital Signs    Vitals:   03/08/18 1400 03/08/18 1431 03/08/18 1445 03/08/18 1542  BP: 105/60 (!) 103/55 103/62 111/72  Pulse: 73 70  83  Resp:      Temp:      TempSrc:      SpO2:      Weight:      Height:        Intake/Output Summary (Last 24 hours) at 03/08/2018 1718 Last data filed at 03/08/2018 1548 Gross per 24 hour  Intake 988.9 ml  Output 1100 ml  Net -111.1 ml   Last 3 Weights 03/08/2018 03/07/2018 08/15/2014  Weight (lbs) 124 lb 126 lb 1.6 oz 108 lb 8 oz  Weight (kg) 56.246 kg 57.199 kg 49.215 kg      Telemetry    NSR - Personally Reviewed  ECG     NSR  - Personally Reviewed  Physical Exam   GEN: No acute distress.   Neck: No JVD Cardiac: RRR, no murmurs, rubs, or gallops.  Respiratory: Clear to auscultation bilaterally. GI:  Soft, nontender, non-distended  MS: No edema; right radial cath   site looks good. Neuro:  Nonfocal  Psych: Normal affect   Labs    Chemistry Recent Labs  Lab 03/07/18 1953 03/08/18 0443  NA 139 139  K 3.7 3.3*  CL 98 98  CO2 26 31  GLUCOSE 180* 114*  BUN 11 15  CREATININE 0.95 0.93  CALCIUM 9.3 9.1  GFRNONAA >60 >60  GFRAA >60 >60  ANIONGAP 15 10     Hematology Recent Labs  Lab 03/07/18 1953 03/08/18 0443  WBC 7.3 9.1  RBC 4.35 4.07  HGB 13.5 12.7  HCT 42.7 39.9  MCV 98.2 98.0  MCH 31.0 31.2  MCHC 31.6 31.8  RDW 12.7 12.8  PLT 422* 389    Cardiac EnzymesNo results for input(s): TROPONINI in the last 168 hours. No results for input(s): TROPIPOC in the last 168 hours.   BNP Recent Labs  Lab 03/07/18 1953  BNP 1,594.7*     DDimer No results for input(s): DDIMER in the last 168 hours.   Radiology    No results found.  Cardiac Studies      Patient Profile     53 y.o. female admitted with severe shortness of  breath.  Assessment & Plan    1.  Acute on chronic diastolic congestive heart failure: The patient did not have any significant coronary artery disease at heart catheterization.  She was found to have elevated end-diastolic filling pressures.  She  admits to eating quite a bit of extra salt. I have advised her to stay away from salt. She may or may not need a diuretic on top of that.  She is stable and can be discharged home tomorrow. She will need a note excusing her from work until next Monday.  She works a fairly physical job and uses her hands and wrists quite a bit.  She needs to avoid  Using  her wrists for at least 6 to 7 days because of her heart catheterization.      For questions or updates, please contact [place] Please consult [email or web address] for contact info under        Signed, Mertie Moores, MD  03/08/2018, 5:18 PM

## 2018-03-09 DIAGNOSIS — E876 Hypokalemia: Secondary | ICD-10-CM

## 2018-03-09 LAB — CBC
HEMATOCRIT: 41.4 % (ref 36.0–46.0)
Hemoglobin: 12.8 g/dL (ref 12.0–15.0)
MCH: 30.8 pg (ref 26.0–34.0)
MCHC: 30.9 g/dL (ref 30.0–36.0)
MCV: 99.8 fL (ref 80.0–100.0)
Platelets: 402 10*3/uL — ABNORMAL HIGH (ref 150–400)
RBC: 4.15 MIL/uL (ref 3.87–5.11)
RDW: 12.7 % (ref 11.5–15.5)
WBC: 10.2 10*3/uL (ref 4.0–10.5)
nRBC: 0 % (ref 0.0–0.2)

## 2018-03-09 LAB — POCT I-STAT 3, ART BLOOD GAS (G3+)
Acid-Base Excess: 8 mmol/L — ABNORMAL HIGH (ref 0.0–2.0)
Bicarbonate: 33.5 mmol/L — ABNORMAL HIGH (ref 20.0–28.0)
O2 SAT: 92 %
PCO2 ART: 48.6 mmHg — AB (ref 32.0–48.0)
TCO2: 35 mmol/L — ABNORMAL HIGH (ref 22–32)
pH, Arterial: 7.447 (ref 7.350–7.450)
pO2, Arterial: 62 mmHg — ABNORMAL LOW (ref 83.0–108.0)

## 2018-03-09 LAB — POCT I-STAT 3, VENOUS BLOOD GAS (G3P V)
Acid-Base Excess: 10 mmol/L — ABNORMAL HIGH (ref 0.0–2.0)
Bicarbonate: 34.4 mmol/L — ABNORMAL HIGH (ref 20.0–28.0)
O2 Saturation: 72 %
TCO2: 36 mmol/L — AB (ref 22–32)
pCO2, Ven: 45.3 mmHg (ref 44.0–60.0)
pH, Ven: 7.489 — ABNORMAL HIGH (ref 7.250–7.430)
pO2, Ven: 35 mmHg (ref 32.0–45.0)

## 2018-03-09 LAB — BASIC METABOLIC PANEL
Anion gap: 11 (ref 5–15)
BUN: 18 mg/dL (ref 6–20)
CO2: 27 mmol/L (ref 22–32)
Calcium: 8.6 mg/dL — ABNORMAL LOW (ref 8.9–10.3)
Chloride: 99 mmol/L (ref 98–111)
Creatinine, Ser: 0.89 mg/dL (ref 0.44–1.00)
GFR calc Af Amer: >60 mL/min (ref >60)
GFR calc non Af Amer: >60 mL/min (ref >60)
Glucose, Bld: 102 mg/dL — ABNORMAL HIGH (ref 70–99)
Potassium: 3.4 mmol/L — ABNORMAL LOW (ref 3.5–5.1)
Sodium: 137 mmol/L (ref 135–145)

## 2018-03-09 MED ORDER — DOXYCYCLINE HYCLATE 100 MG PO TABS: 100.0000 mg | ORAL_TABLET | Freq: Two times a day (BID) | ORAL | 0 refills | Status: AC

## 2018-03-09 MED ORDER — SPIRONOLACTONE 25 MG PO TABS: 12.5000 mg | ORAL_TABLET | Freq: Every day | ORAL | 0 refills | Status: DC

## 2018-03-09 MED ORDER — SPIRONOLACTONE 12.5 MG HALF TABLET
12.5000 mg | ORAL_TABLET | Freq: Every day | ORAL | Status: DC
  Filled 2018-03-09: qty 1

## 2018-03-09 MED ORDER — POTASSIUM CHLORIDE CRYS ER 20 MEQ PO TBCR
40.0000 meq | EXTENDED_RELEASE_TABLET | Freq: Once | ORAL | Status: AC
  Administered 2018-03-09: 40 meq via ORAL
  Filled 2018-03-09: qty 2

## 2018-03-09 NOTE — Plan of Care (Signed)

## 2018-03-09 NOTE — Discharge Summary (Addendum)
Physician Discharge Summary  [name]:[id number] DOB: 12/11/1965 DOA: 03/07/2018  PCP: Patient, No Pcp Per  Admit date: 03/07/2018 Discharge date: 03/09/2018  Admitted From: Home Disposition:  Home  Recommendations for Outpatient Follow-up:  1. Establish with and follow up with PCP as soon as able 2. Follow up with Cardiology in 4-6 weeks. Will need follow up echo in 3-6 months.  3. Advised to adhere to low sodium diet and to stop smoking   Discharge Condition: Stable CODE STATUS: Full  Diet recommendation: Heart healthy   Brief/Interim Summary: Mary C Jenkinsis a 53 year old Caucasian female with past medical history of GERD, anxiety and tobacco abuse who presentedto the emergency department due to 1 day of generalized and shortness of breath. Patient said that she was recently diagnosed with bronchitis and was placed on medication. However, she continues to have chest congestion and nasal congestion with dry cough with inability to be able to bring up any sputum. She also complained of worsening generalized weakness. Patient said she was recently started on atenolol due to hypertension and albuterol due to suspected COPD at the ED, she returned complaining of worsening weakness and shortness of breath on exertion, she complained of soreness which was reproducible on left sternal area, however she denies orthopnea, PND, leg swelling or any significant increased weight gain recently. In the emergency department, vital signs were normal except for BP at 144/81. Workup in the ED showed hypokalemia, hypocalcemia, hypomagnesemia, troponin x1 0.06, proBNP 2820. Chest x-ray showed no focal consolidation. Aspirin, breathing treatment and IV magnesium was given. Hospitalist was called to admit patient for further evaluation and management. She was [facility] at [facility] due acute bronchitisand was further determined to have new onset systolic congestive heart failure. Cardiology was  consulted, patient was referred for transfer to [facility] for cardiac cath/ischemic evaluation. She underwent heart cath 1/21 which did not find significant coronary artery disease. She was found to have elevated end-diastolic filling pressures.   New onset systolic and diastolic heart failure Newly diagnosed at [facility], patient with continued exertional dyspnea proBNP 2820 at [facility] Echocardiogram with global LV hypokinesis, EF 40 to 45% Cardiology consulted, s/p heart cath with end-diastolic filling pressures  Advised low sodium diet, follow up with cardiology, started on spironolactone 12.5mg  daily. Will need f/u echo in 3-6 months.   Acute hypoxemic respiratory failure Does not require nasal cannula O2 at baseline Now on room air   Acute bronchitis Doxycycline, prednisone, breathing treatments - with much improvement. Finish doxycycline course. No longer with wheeze or rhonchi.   Mildly elevated troponin Troponin 0 0.063 at [facility]  Alcohol abuse Last alcohol drink was over 1 week ago  Tobacco abuse Cessation counseling  Anxiety Xanax prn  Hypokalemia Replace, trend   Discharge Instructions  Discharge Instructions    (HEART FAILURE PATIENTS) Call MD:  Anytime you have any of the following symptoms: 1) 3 pound weight gain in 24 hours or 5 pounds in 1 week 2) shortness of breath, with or without a dry hacking cough 3) swelling in the hands, feet or stomach 4) if you have to sleep on extra pillows at night in order to breathe.   Complete by:  As directed    Call MD for:  difficulty breathing, headache or visual disturbances   Complete by:  As directed    Call MD for:  extreme fatigue   Complete by:  As directed    Call MD for:  hives  Complete by:  As directed    Call MD for:  persistant dizziness or light-headedness   Complete by:  As directed    Call MD for:  persistant nausea and vomiting   Complete by:  As directed    Call  MD for:  severe uncontrolled pain   Complete by:  As directed    Call MD for:  temperature >100.4   Complete by:  As directed    Diet - low sodium heart healthy   Complete by:  As directed    Discharge instructions   Complete by:  As directed    You were cared for by a hospitalist during your hospital stay. If you have any questions about your discharge medications or the care you received while you were in the hospital after you are discharged, you can call the unit and ask to speak with the hospitalist on call if the hospitalist that took care of you is not available. Once you are discharged, your primary care physician will handle any further medical issues. Please note that NO REFILLS for any discharge medications will be authorized once you are discharged, as it is imperative that you return to your primary care physician (or establish a relationship with a primary care physician if you do not have one) for your aftercare needs so that they can reassess your need for medications and monitor your lab values.   Increase activity slowly   Complete by:  As directed      Allergies as of 03/09/2018      Reactions   Codeine    Sulfa Antibiotics       Medication List    STOP taking these medications   atenolol 25 MG tablet Commonly known as:  TENORMIN   gabapentin 300 MG capsule Commonly known as:  NEURONTIN   potassium chloride 10 MEQ CR capsule Commonly known as:  MICRO-K     TAKE these medications   alprazolam 2 MG tablet Commonly known as:  XANAX Take 2 mg by mouth at bedtime as needed.   benzonatate 100 MG capsule Commonly known as:  TESSALON Take 100 mg by mouth every 4 (four) hours as needed for cough. For ten days. Do not exceed 600mg  per day.   carboxymethylcellulose 0.5 % Soln Commonly known as:  REFRESH PLUS Place 1 drop into both eyes daily as needed (dry eyes).   doxycycline 100 MG tablet Commonly known as:  VIBRA-TABS Take 1 tablet (100 mg total) by mouth 2  (two) times daily for 2 days. What changed:  additional instructions   esomeprazole 20 MG capsule Commonly known as:  NEXIUM Take 40 mg by mouth daily at 12 noon.   ibuprofen 200 MG tablet Commonly known as:  ADVIL,MOTRIN Take 600 mg by mouth every 6 (six) hours as needed for headache or moderate pain.   Magnesium 500 MG Tabs Take 1 tablet by mouth daily.   Melatonin 5 MG Tabs Take 5 mg by mouth at bedtime as needed (sleep).   PROAIR HFA 108 (90 Base) MCG/ACT inhaler Generic drug:  albuterol as needed.   albuterol (2.5 MG/3ML) 0.083% nebulizer solution Commonly known as:  PROVENTIL Inhale 3 mLs into the lungs every 4 (four) hours.   promethazine 50 MG tablet Commonly known as:  PHENERGAN Take 1 tablet (50 mg total) by mouth 2 (two) times daily.   promethazine 25 MG tablet Commonly known as:  PHENERGAN Take 1 tablet (25 mg total) by mouth every 8 (eight) hours  as needed for nausea.   spironolactone 25 MG tablet Commonly known as:  ALDACTONE Take 0.5 tablets (12.5 mg total) by mouth daily.   trimethoprim-polymyxin b ophthalmic solution Commonly known as:  POLYTRIM Place 1 drop into both eyes 2 (two) times daily.   UNABLE TO FIND Take 5 mg by mouth at bedtime as needed (sleep).      Follow-up Information    Nahser, Wonda Cheng, MD. Schedule an appointment as soon as possible for a visit in 4 week(s).   Specialty:  Cardiology Contact information: [address] [place] [address] [phone]        Establish with a primary care physician as soon as possible. Schedule an appointment as soon as possible for a visit in 1 week(s).          Allergies  Allergen Reactions  . Codeine   . Sulfa Antibiotics     Consultations:  Cardiology    Procedures/Studies: No results found.  Heart cath 1/21  Ms. Guagliardo has a nonischemic cardiomyopathy with entirely normal coronary arteries.  Her LVEDP is mildly elevated although her wedge is fairly  normal and she has a excellent cardiac output.  Medical therapy will be recommended including lifestyle modification, smoking cessation and avoidance of salt.  The brachial venous sheath was removed and pressure held.  The radial sheath was removed and a TR band was placed on the right wrist to achieve patent hemostasis.  The patient left the lab in stable condition.   Discharge Exam: Vitals:   03/09/18 0452 03/09/18 0857  BP: 118/81   Pulse: 76   Resp: 18   Temp: 98.3 F (36.8 C)   SpO2: 94% 98%    General: Pt is alert, awake, not in acute distress Cardiovascular: RRR, S1/S2 +, no rubs, no gallops Respiratory: CTA bilaterally, no wheezing, no rhonchi Abdominal: Soft, NT, ND, bowel sounds + Extremities: no edema, no cyanosis    The results of significant diagnostics from this hospitalization (including imaging, microbiology, ancillary and laboratory) are listed below for reference.     Microbiology: No results found for this or any previous visit (from the past 240 hour(s)).   Labs: BNP (last 3 results) Recent Labs    03/07/18 1953  BNP 8,101.7*   Basic Metabolic Panel: Recent Labs  Lab 03/07/18 1953 03/08/18 0443 03/09/18 0339  NA 139 139 137  K 3.7 3.3* 3.4*  CL 98 98 99  CO2 26 31 27   GLUCOSE 180* 114* 102*  BUN 11 15 18   CREATININE 0.95 0.93 0.89  CALCIUM 9.3 9.1 8.6*   Liver Function Tests: No results for input(s): AST, ALT, ALKPHOS, BILITOT, PROT, ALBUMIN in the last 168 hours. No results for input(s): LIPASE, AMYLASE in the last 168 hours. No results for input(s): AMMONIA in the last 168 hours. CBC: Recent Labs  Lab 03/07/18 1953 03/08/18 0443 03/09/18 0339  WBC 7.3 9.1 10.2  HGB 13.5 12.7 12.8  HCT 42.7 39.9 41.4  MCV 98.2 98.0 99.8  PLT 422* 389 402*   Cardiac Enzymes: No results for input(s): CKTOTAL, CKMB, CKMBINDEX, TROPONINI in the last 168 hours. BNP: Invalid input(s): POCBNP CBG: No results for input(s): GLUCAP in the last 168  hours. D-Dimer No results for input(s): DDIMER in the last 72 hours. Hgb A1c No results for input(s): HGBA1C in the last 72 hours. Lipid Profile No results for input(s): CHOL, HDL, LDLCALC, TRIG, CHOLHDL, LDLDIRECT in the last 72 hours. Thyroid function studies No results  for input(s): TSH, T4TOTAL, T3FREE, THYROIDAB in the last 72 hours.  Invalid input(s): FREET3 Anemia work up No results for input(s): VITAMINB12, FOLATE, FERRITIN, TIBC, IRON, RETICCTPCT in the last 72 hours. Urinalysis No results found for: COLORURINE, APPEARANCEUR, LABSPEC, [place], GLUCOSEU, HGBUR, BILIRUBINUR, KETONESUR, PROTEINUR, UROBILINOGEN, NITRITE, LEUKOCYTESUR Sepsis Labs Invalid input(s): PROCALCITONIN,  WBC,  LACTICIDVEN Microbiology No results found for this or any previous visit (from the past 240 hour(s)).   Patient was seen and examined on the day of discharge and was found to be in stable condition. Time coordinating discharge: 25 minutes including assessment and coordination of care, as well as examination of the patient.   SIGNED:  Dessa Phi, DO [facility] [email or web address] 03/09/2018, 9:01 AM

## 2018-03-09 NOTE — Progress Notes (Signed)
Pt discharged home, IV  and tele removed. Discharge instructions given questions answered,.

## 2018-03-09 NOTE — Discharge Instructions (Signed)

## 2018-03-09 NOTE — Progress Notes (Signed)
Progress Note  Patient Name: Mary Franco Date of Encounter: 03/09/2018  Primary Cardiologist: Mertie Moores, MD   Subjective   Patient is feeling better.  Heart cath today did not reveal any critical coronary lesions.  She had elevated end-diastolic filling pressures.  Talking with Mykeisha, she eats quite a bit of salt.  She is feeling better this am    Inpatient Medications    Scheduled Meds: . aspirin  81 mg Oral Daily  . doxycycline  100 mg Oral Q12H  . enoxaparin (LOVENOX) injection  40 mg Subcutaneous Q24H  . gabapentin  300 mg Oral BID  . ipratropium-albuterol  3 mL Nebulization BID  . nicotine  14 mg Transdermal Daily  . potassium chloride  40 mEq Oral Once  . predniSONE  50 mg Oral Q breakfast  . sodium chloride flush  3 mL Intravenous Q12H  . sodium chloride flush  3 mL Intravenous Q12H   Continuous Infusions: . sodium chloride    . sodium chloride     PRN Meds: sodium chloride, sodium chloride, acetaminophen, albuterol, alprazolam, benzonatate, ondansetron (ZOFRAN) IV, ondansetron **OR** [DISCONTINUED] ondansetron (ZOFRAN) IV, sodium chloride flush, sodium chloride flush   Vital Signs    Vitals:   03/08/18 1542 03/08/18 1917 03/08/18 1919 03/09/18 0452  BP: 111/72  130/70 118/81  Pulse: 83  87 76  Resp:   18 18  Temp:   97.6 F (36.4 C) 98.3 F (36.8 C)  TempSrc:   Oral Oral  SpO2:  96% 96% 94%  Weight:    58 kg  Height:        Intake/Output Summary (Last 24 hours) at 03/09/2018 0852 Last data filed at 03/09/2018 0600 Gross per 24 hour  Intake 1002 ml  Output 1300 ml  Net -298 ml   Last 3 Weights 03/09/2018 03/08/2018 03/07/2018  Weight (lbs) 127 lb 14.4 oz 124 lb 126 lb 1.6 oz  Weight (kg) 58.015 kg 56.246 kg 57.199 kg      Telemetry    NSR - Personally Reviewed  ECG       Physical Exam   GEN:  Middle-aged female, no acute distress.   Neck: No JVD Cardiac:  S1-S2.  No significant murmur rubs, or gallops.  Respiratory:   Scattered wheezes.  No rales. GI: Soft, nontender, non-distended  MS:  Edema.  Right radial cath site looks good.   Neuro:  Nonfocal  Psych: Normal affect   Labs    Chemistry Recent Labs  Lab 03/07/18 1953 03/08/18 0443 03/09/18 0339  NA 139 139 137  K 3.7 3.3* 3.4*  CL 98 98 99  CO2 26 31 27   GLUCOSE 180* 114* 102*  BUN 11 15 18   CREATININE 0.95 0.93 0.89  CALCIUM 9.3 9.1 8.6*  GFRNONAA >60 >60 >60  GFRAA >60 >60 >60  ANIONGAP 15 10 11      Hematology Recent Labs  Lab 03/07/18 1953 03/08/18 0443 03/09/18 0339  WBC 7.3 9.1 10.2  RBC 4.35 4.07 4.15  HGB 13.5 12.7 12.8  HCT 42.7 39.9 41.4  MCV 98.2 98.0 99.8  MCH 31.0 31.2 30.8  MCHC 31.6 31.8 30.9  RDW 12.7 12.8 12.7  PLT 422* 389 402*    Cardiac EnzymesNo results for input(s): TROPONINI in the last 168 hours. No results for input(s): TROPIPOC in the last 168 hours.   BNP Recent Labs  Lab 03/07/18 1953  BNP 1,594.7*     DDimer No results for input(s): DDIMER in the last  168 hours.   Radiology    No results found.  Cardiac Studies      Patient Profile     53 y.o. female admitted with severe shortness of breath.  Assessment & Plan    1.  Acute on chronic diastolic congestive heart failure: She is done very well.  It is difficult to say whether or not she will need diuretics.  She eats quite a bit of salt.  I have advised her to stay away from eating so much salt.  If she become short of breath she should give [place] a call.   Will try Spironolactone 12.5 mg a day .     We will see her in the office.  She will need a follow-up echocardiogram in 3 to 6 months.  2.  Hypokalemia.  Admits to not eating many fruits.  She needs to eat more high potassium containing foods.  Recheck labs at her next office visit. We are adding spironolactone 12. 5 mg a day - titrate as needed.    3.  COPD: She needs to quit smoking.  She is stable and can be discharged home tomorrow. She will need a note excusing her  from work until next Monday.  She works a fairly physical job and uses her hands and wrists quite a bit.  She needs to avoid  Using  her wrists for at least 6 to 7 days because of her heart catheterization.      For questions or updates, please contact [place] Please consult [email or web address] for contact info under        Signed, Mertie Moores, MD  03/09/2018, 8:52 AM

## 2018-03-17 DIAGNOSIS — R05 Cough: Secondary | ICD-10-CM | POA: Diagnosis not present

## 2018-03-17 DIAGNOSIS — I5020 Unspecified systolic (congestive) heart failure: Secondary | ICD-10-CM | POA: Diagnosis not present

## 2018-03-25 ENCOUNTER — Ambulatory Visit: Payer: Commercial Managed Care - PPO | Admitting: Physician Assistant

## 2018-03-25 ENCOUNTER — Encounter: Payer: Self-pay | Admitting: Physician Assistant

## 2018-03-25 VITALS — BP 130/82 | HR 81 | Ht 62.0 in | Wt 126.0 lb

## 2018-03-25 DIAGNOSIS — I428 Other cardiomyopathies: Secondary | ICD-10-CM | POA: Insufficient documentation

## 2018-03-25 DIAGNOSIS — I10 Essential (primary) hypertension: Secondary | ICD-10-CM

## 2018-03-25 DIAGNOSIS — Z720 Tobacco use: Secondary | ICD-10-CM

## 2018-03-25 DIAGNOSIS — I5042 Chronic combined systolic (congestive) and diastolic (congestive) heart failure: Secondary | ICD-10-CM | POA: Insufficient documentation

## 2018-03-25 HISTORY — DX: Chronic combined systolic (congestive) and diastolic (congestive) heart failure: I50.42

## 2018-03-25 HISTORY — DX: Other cardiomyopathies: I42.8

## 2018-03-25 MED ORDER — BISOPROLOL FUMARATE 5 MG PO TABS: ORAL_TABLET | ORAL | 3 refills | Status: DC

## 2018-03-25 NOTE — Patient Instructions (Signed)
Medication Instructions:  Your physician has recommended you make the following change in your medication:  1. START ON BISOPROLOL 2.5 MG DAILY FOR A 1 WEEK, THEN INCREASE TO 5 MG DAILY.  If you need a refill on your cardiac medications before your next appointment, please call your pharmacy.   Lab work: TODAY: BMET  If you have labs (blood work) drawn today and your tests are completely normal, you will receive your results only by: Marland Kitchen MyChart Message (if you have MyChart) OR . A paper copy in the mail If you have any lab test that is abnormal or we need to change your treatment, we will call you to review the results.  Testing/Procedures: NONE  Follow-Up: At [facility], you and your health needs are our priority.  As part of our continuing mission to provide you with exceptional heart care, we have created designated Provider Care Teams.  These Care Teams include your primary Cardiologist (physician) and Advanced Practice Providers (APPs -  Physician Assistants and Nurse Practitioners) who all work together to provide you with the care you need, when you need it. . You will need a follow up appointment in: 2-3 WEEKS.   You may see Mertie Moores, MD or Maryann Conners, PA-C  Any Other Special Instructions Will Be Listed Below (If Applicable).

## 2018-03-25 NOTE — Progress Notes (Signed)
Cardiology Office Note:    Date:  03/25/2018   ID:  Mary Franco, DOB 1965/09/16, MRN [id number]  PCP:  Sarajane Jews, FNP  Cardiologist:  Mertie Moores, MD   Electrophysiologist:  None   Referring MD: No ref. provider found   Chief Complaint  Patient presents with  . Hospitalization Follow-up    Admitted with acute CHF     History of Present Illness:    Mary Franco is a 53 y.o. female with acid reflux, alcohol abuse and tobacco use.  She was admitted 1/20-1/22 with new onset systolic and diastolic heart failure.  She was initially seen at [facility].  Echocardiogram demonstrated EF 40-45%.  She was transferred to [facility].  Cardiac catheterization demonstrated normal coronary arteries.     Ms. Trimmer returns for follow-up.  She is here alone.  She has tried to return to work since leaving the hospital.  However, she has had difficulty with her boss.  She works in a [employer], [employer].  Her primary care doctor placed her on light duty.  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  She has an appointment pending with pulmonology next week.  She uses albuterol with some relief.  She notes wheezing at times.  She feels short of breath with most activities.  She also feels tight in her chest at times.  Her abdominal bloating is reduced since being in the hospital.  She denies orthopnea, PND or lower extremity swelling.  She denies syncope.  Her blood pressure has been elevated at times at work.  She notes one occasion when her blood pressure was 190/120.  Prior CV studies:   The following studies were reviewed today:  R/L Cardiac Catheterization 03/08/18 Normal coronary arteries Elevated LV EDP consistent with volume overload. Right atrial pressure- 5/3 Right ventricular pressure- 34/2 Pulmonary artery pressure- 35/17, mean 25 Pulmonary wedge pressure- A-wave 19, V wave 17, mean 15 LVEDP- 20 Cardiac output (Fick)-6 L/min  Echocardiogram  02/2018 [facility]) EF 40-45, mild LAE,?  PFO/small ASD, mild MR, moderate TR, RVSP 51 (moderate pulmonary hypertension)   Past Medical History:  Diagnosis Date  . Alcohol abuse   . Anxiety   . Asthma   . Cervical strain 04/16/2014  . Cervicogenic headache 04/16/2014  . COPD (chronic obstructive pulmonary disease) ([place])   . Fibromyalgia   . Migraines    Surgical Hx: The patient  has a past surgical history that includes Tubal ligation; Appendectomy; Knee surgery (Left); and RIGHT/LEFT HEART CATH AND CORONARY ANGIOGRAPHY (N/A, 03/08/2018).   Current Medications: Current Meds  Medication Sig  . albuterol (PROVENTIL) (2.5 MG/3ML) 0.083% nebulizer solution Inhale 3 mLs into the lungs every 4 (four) hours.  Marland Kitchen alprazolam (XANAX) 2 MG tablet Take 2 mg by mouth at bedtime as needed.  . benzonatate (TESSALON) 100 MG capsule Take 100 mg by mouth every 4 (four) hours as needed for cough. For ten days. Do not exceed 600mg  per day.  . carboxymethylcellulose (REFRESH PLUS) 0.5 % SOLN Place 1 drop into both eyes daily as needed (dry eyes).  Marland Kitchen esomeprazole (NEXIUM) 20 MG capsule Take 40 mg by mouth daily at 12 noon.  Marland Kitchen ibuprofen (ADVIL,MOTRIN) 200 MG tablet Take 600 mg by mouth every 6 (six) hours as needed for headache or moderate pain.  . Magnesium 500 MG TABS Take 1 tablet by mouth daily.  . Melatonin 5 MG TABS Take 5 mg by mouth at bedtime as needed (sleep).  Marland Kitchen  PROAIR HFA 108 (90 BASE) MCG/ACT inhaler as needed.  . promethazine (PHENERGAN) 25 MG tablet Take 1 tablet (25 mg total) by mouth every 8 (eight) hours as needed for nausea.  . promethazine (PHENERGAN) 50 MG tablet Take 1 tablet (50 mg total) by mouth 2 (two) times daily.  Marland Kitchen spironolactone (ALDACTONE) 25 MG tablet Take 0.5 tablets (12.5 mg total) by mouth daily.  Marland Kitchen trimethoprim-polymyxin b (POLYTRIM) ophthalmic solution Place 1 drop into both eyes 2 (two) times daily.  Marland Kitchen UNABLE TO FIND Take 5 mg by mouth at bedtime as needed (sleep).       Allergies:   Codeine and Sulfa antibiotics   Social History   Tobacco Use  . Smoking status: Current Every Day Smoker    Packs/day: 1.00    Years: 40.00    Pack years: 40.00  . Smokeless tobacco: Never Used  Substance Use Topics  . Alcohol use: Yes    Comment: 2 x per week  . Drug use: No     Family Hx: The patient's family history includes Cancer in her mother; Colitis in her paternal aunt; Ovarian cancer in her maternal grandmother.  ROS:   Please see the history of present illness.    Review of Systems  Constitution: Positive for malaise/fatigue.  Cardiovascular: Positive for chest pain and dyspnea on exertion.  Respiratory: Positive for cough.   Musculoskeletal: Positive for back pain.  Neurological: Positive for headaches and loss of balance.  Psychiatric/Behavioral: The patient is nervous/anxious.    All other systems reviewed and are negative.   EKGs/Labs/Other Test Reviewed:    EKG:  EKG is   ordered today.  The ekg ordered today demonstrates normal sinus rhythm, heart rate 81, normal axis, LVH, nonspecific ST-T wave changes, QTC 434, PVC  Recent Labs: 03/07/2018: B Natriuretic Peptide 1,594.7 03/09/2018: BUN 18; Creatinine, Ser 0.89; Hemoglobin 12.8; Platelets 402; Potassium 3.4; Sodium 137   Recent Lipid Panel No results found for: CHOL, TRIG, HDL, CHOLHDL, LDLCALC, LDLDIRECT  Physical Exam:    VS:  BP 130/82   Pulse 81   Ht 5\' 2"  (1.575 m)   Wt 126 lb (57.2 kg)   SpO2 97%   BMI 23.05 kg/m     Wt Readings from Last 3 Encounters:  03/25/18 126 lb (57.2 kg)  03/09/18 127 lb 14.4 oz (58 kg)  08/15/14 108 lb 8 oz (49.2 kg)     Physical Exam  Constitutional: She is oriented to person, place, and time. She appears well-developed and well-nourished. No distress.  HENT:  Head: Normocephalic and atraumatic.  Eyes: No scleral icterus.  Neck: No JVD present. No thyromegaly present.  Cardiovascular: Normal rate and regular rhythm.  No murmur  heard. Pulmonary/Chest: She has decreased breath sounds. She has no wheezes. She has no rales.  Abdominal: Soft. She exhibits no distension.  Musculoskeletal:        General: No edema.     Comments: Right wrist without hematoma  Lymphadenopathy:    She has no cervical adenopathy.  Neurological: She is alert and oriented to person, place, and time.  Skin: Skin is warm and dry.  Psychiatric: She has a normal mood and affect.    ASSESSMENT & PLAN:    Chronic combined systolic and diastolic CHF (congestive heart failure) (HCC)  EF 40-45.  She is NYHA 2-3.  I suspect her shortness of breath is mainly impacted by her underlying COPD from her long history of tobacco use.  She does not display any  signs of volume excess on exam today.  She is currently only taking spironolactone for management of her heart failure.  I suspect her blood pressure was running too low in the hospital to initiate any other therapy.  I believe that her blood pressure can now tolerate heart failure therapy.    -Continue spironolactone 12.5 mg daily  -Start bisoprolol 2.5 mg daily x1 week, then 5 mg daily  -Obtain BMET today  -Consider losartan at follow-up  NICM (nonischemic cardiomyopathy) ([place]) Question if related to alcohol.  She has not had alcohol to drink since she went to the hospital.  I have advised her to continue to refrain from this.  Continue to try to optimize her medical management.  Plan follow-up echo in 3 months.  Tobacco abuse We discussed the importance of quitting.  She does note significant shortness of breath.  I suspect she has underlying COPD.  She has an appointment with pulmonology next week.  As noted, I suspect her shortness of breath is related to COPD more than heart failure.  She was asking for paperwork to be filled out for her job.  I explained that we could complete paperwork that would cover her while she has been off for her hospitalization and recovery.  However, if she has  significant COPD and needs ongoing limitations to work, this would come from pulmonology.  She will contact her insurance company and send what ever needs to be sent to [place].  I have given her a note to remain out of work for the next 2 weeks to cover her until she can have further evaluation with pulmonology.  Essential hypertension Initiate bisoprolol for heart failure as noted above.   Dispo:  Return in about 3 weeks (around 04/15/2018) for Routine Follow Up, w/ Dr. Acie Fredrickson, or Richardson Dopp, PA-C.   Medication Adjustments/Labs and Tests Ordered: Current medicines are reviewed at length with the patient today.  Concerns regarding medicines are outlined above.  Tests Ordered: Orders Placed This Encounter  Procedures  . Basic metabolic panel  . EKG 12-Lead   Medication Changes: Meds ordered this encounter  Medications  . bisoprolol (ZEBETA) 5 MG tablet    Sig: Take 2.5 mg daily for 1 week and then take 5 mg daily.    Dispense:  90 tablet    Refill:  3    Signed, Richardson Dopp, PA-C  03/25/2018 2:29 PM    [facility] [address], [place], [place]  [address] Phone: [phone]; Fax: [phone]

## 2018-03-26 LAB — BASIC METABOLIC PANEL
BUN/Creatinine Ratio: 14 (ref 9–23)
BUN: 12 mg/dL (ref 6–24)
CO2: 23 mmol/L (ref 20–29)
Calcium: 9.4 mg/dL (ref 8.7–10.2)
Chloride: 96 mmol/L (ref 96–106)
Creatinine, Ser: 0.88 mg/dL (ref 0.57–1.00)
GFR calc Af Amer: 86 mL/min/{1.73_m2} (ref >59)
GFR calc non Af Amer: 75 mL/min/{1.73_m2} (ref >59)
Glucose: 113 mg/dL — ABNORMAL HIGH (ref 65–99)
Potassium: 4 mmol/L (ref 3.5–5.2)
Sodium: 136 mmol/L (ref 134–144)

## 2018-03-28 ENCOUNTER — Institutional Professional Consult (permissible substitution): Payer: 59 | Admitting: Internal Medicine

## 2018-03-28 ENCOUNTER — Telehealth: Payer: Self-pay | Admitting: Physician Assistant

## 2018-03-28 NOTE — Telephone Encounter (Signed)
° ° ° °  Please return call to patient with lab results 

## 2018-03-29 ENCOUNTER — Ambulatory Visit: Payer: 59 | Admitting: Physician Assistant

## 2018-03-29 DIAGNOSIS — N870 Mild cervical dysplasia: Secondary | ICD-10-CM | POA: Diagnosis not present

## 2018-03-29 NOTE — Telephone Encounter (Signed)
Reviewed results with patient who verbalized understanding. A copy a has been sent to pt's PCP.

## 2018-03-29 NOTE — Telephone Encounter (Signed)
LMOM on pt machine to call our office back about results.

## 2018-03-30 ENCOUNTER — Encounter: Payer: Self-pay | Admitting: Internal Medicine

## 2018-03-30 ENCOUNTER — Ambulatory Visit: Payer: Commercial Managed Care - PPO | Admitting: Internal Medicine

## 2018-03-30 DIAGNOSIS — J449 Chronic obstructive pulmonary disease, unspecified: Secondary | ICD-10-CM | POA: Diagnosis not present

## 2018-03-30 DIAGNOSIS — I5042 Chronic combined systolic (congestive) and diastolic (congestive) heart failure: Secondary | ICD-10-CM

## 2018-03-30 DIAGNOSIS — F17210 Nicotine dependence, cigarettes, uncomplicated: Secondary | ICD-10-CM | POA: Diagnosis not present

## 2018-03-30 DIAGNOSIS — Z720 Tobacco use: Secondary | ICD-10-CM

## 2018-03-30 HISTORY — DX: Chronic obstructive pulmonary disease, unspecified: J44.9

## 2018-03-30 MED ORDER — FLUTICASONE-UMECLIDIN-VILANT 100-62.5-25 MCG/INH IN AEPB: 1.0000 | INHALATION_SPRAY | Freq: Every day | RESPIRATORY_TRACT | 0 refills | Status: DC

## 2018-03-30 MED ORDER — FLUTICASONE-UMECLIDIN-VILANT 100-62.5-25 MCG/INH IN AEPB: 1.0000 | INHALATION_SPRAY | Freq: Every day | RESPIRATORY_TRACT | 11 refills | Status: DC

## 2018-03-30 NOTE — Patient Instructions (Addendum)
You do not appear to have bad copd   Trelegy one click each am to see if breathingh better and less need for albuterol  Only use your albuterol as a rescue medication to be used if you can't catch your breath by resting or doing a relaxed purse lip breathing pattern.  - The less you use it, the better it will work when you need it. - Ok to use up to 2 puffs  every 4 hours if you must but call for immediate appointment if use goes up over your usual need - Don't leave home without it !!  (think of it like the spare tire for your car)   Please schedule a follow up office visit in 4 weeks, sooner if needed  with all medications /inhalers/ solutions in hand so we can verify exactly what you are taking. This includes all medications from all doctors and over the counters  - PFTs on return and cxr

## 2018-03-30 NOTE — Progress Notes (Signed)
Mary Franco, female    DOB: [birth year],     MRN: [id number]   Brief patient profile:  53 yowf active smoker with healthy childhood and last IUP  Age 53 with tendency to sinus infections/ bronchitis starting around 2005 couple times a year occ needed neb that she borrowed from relative but worse since winter  2019 dx with by [facility] with chf some better then in January 19th to [place] then to [facility] then home and says never walked in hallway and maybe 50-60% better but could not get to [place] / nor shop at [employer] and they started smoking and no nebulizer broke    R[facility]L [facility] 03/08/18 EF 40 LVEDP 20 with CO 6lpm   PA mean 25/wedge 15 so PVR = 10/6 = wnl   History of Present Illness  03/30/2018  Pulmonary/ 1st office eval/Mary Franco  Chief Complaint  Patient presents with  . Consult    Referred by Dr. Hassell Done due to wheezing, coughing, chest tightness and congestion. Pt states when she coughs, she is unable to get any mucus to come up.  Dyspnea:  MMRC3 = can't walk 100 yards even at a slow pace at a flat grade s stopping due to sob   Cough: nothing much now, min mucoid in ams Sleep: maybe 30 degrees due to HB SABA use: 4 x daily, rarely bedtime  No obvious day to day or daytime variability or assoc excess/ purulent sputum or mucus plugs or hemoptysis or cp or chest tightness, subjective wheeze or overt sinus or hb symptoms.   Sleeping  without nocturnal  or early am exacerbation  of respiratory  c/o's or need for noct saba. Also denies any obvious fluctuation of symptoms with weather or environmental changes or other aggravating or alleviating factors except as outlined above   No unusual exposure hx or h/o childhood pna/ asthma or knowledge of premature birth.  Current Allergies, Complete Past Medical History, Past Surgical History, Family History, and Social History were reviewed in [employer] record.  ROS  The following are not active complaints unless bolded Hoarseness,  sore throat, dysphagia, dental problems, itching, sneezing,  nasal congestion or discharge of excess mucus or purulent secretions, ear ache,   fever, chills, sweats, unintended wt loss or wt gain, classically pleuritic or exertional cp,  orthopnea pnd or arm/hand swelling  or leg swelling, presyncope, palpitations, abdominal pain, anorexia, nausea, vomiting, diarrhea  or change in bowel habits or change in bladder habits, change in stools or change in urine, dysuria, hematuria,  rash, arthralgias, visual complaints, headache, numbness, weakness or ataxia or problems with walking or coordination,  change in mood anxious or  memory.                  Past Medical History:  Diagnosis Date  . Alcohol abuse   . Anxiety   . Asthma   . Cervical strain 04/16/2014  . Cervicogenic headache 04/16/2014  . COPD (chronic obstructive pulmonary disease) ([place])   . Fibromyalgia   . Migraines     Outpatient Medications Prior to Visit  Medication Sig Dispense Refill  . albuterol (PROVENTIL) (2.5 MG/3ML) 0.083% nebulizer solution Inhale 3 mLs into the lungs every 4 (four) hours.    Marland Kitchen alprazolam (XANAX) 2 MG tablet Take 2 mg by mouth at bedtime as needed.    . benzonatate (TESSALON) 100 MG capsule Take 100 mg by mouth every 4 (four) hours as needed for cough. For ten days. Do  not exceed 600mg  per day.    . bisoprolol (ZEBETA) 5 MG tablet Take 2.5 mg daily for 1 week and then take 5 mg daily. 90 tablet 3  . carboxymethylcellulose (REFRESH PLUS) 0.5 % SOLN Place 1 drop into both eyes daily as needed (dry eyes).    Marland Kitchen esomeprazole (NEXIUM) 20 MG capsule Take 40 mg by mouth daily at 12 noon.    Marland Kitchen ibuprofen (ADVIL,MOTRIN) 200 MG tablet Take 600 mg by mouth every 6 (six) hours as needed for headache or moderate pain.    . Magnesium 500 MG TABS Take 1 tablet by mouth daily.    . Melatonin 5 MG TABS Take 5 mg by mouth at bedtime as needed (sleep).    Marland Kitchen PROAIR HFA 108 (90 BASE) MCG/ACT inhaler as needed.  0  .  trimethoprim-polymyxin b (POLYTRIM) ophthalmic solution Place 1 drop into both eyes 2 (two) times daily.    . promethazine (PHENERGAN) 25 MG tablet Take 1 tablet (25 mg total) by mouth every 8 (eight) hours as needed for nausea. (Patient not taking: Reported on 03/30/2018) 60 tablet 3  . promethazine (PHENERGAN) 50 MG tablet Take 1 tablet (50 mg total) by mouth 2 (two) times daily. 50 tablet 2  . spironolactone (ALDACTONE) 25 MG tablet Take 0.5 tablets (12.5 mg total) by mouth daily. (Patient not taking: Reported on 03/30/2018) 30 tablet 0  . UNABLE TO FIND Take 5 mg by mouth at bedtime as needed (sleep).         Objective:     BP 140/90 (BP Location: Left Arm, Cuff Size: Normal)   Pulse 91   Ht 5\' 2"  (1.575 m)   Wt 127 lb 6.4 oz (57.8 kg)   SpO2 100%   BMI 23.30 kg/m   SpO2: 100 %  RA   slt hoarse amb wf nad   HEENT: nl dentition / oropharynx. Nl external ear canals without cough reflex -  Mild bilateral non-specific turbinate edema     NECK :  without JVD/Nodes/TM/ nl carotid upstrokes bilaterally   LUNGS: no acc muscle use,  Min barrel  contour chest wall with bilateral  Distant bs s audible wheeze and  without cough on insp or exp maneuver and min  Hyperresonant  to  percussion bilaterally     CV:  RRR  no s3 or murmur or increase in P2, and no edema   ABD:  soft and nontender with pos late  insp Hoover's  in the supine position. No bruits or organomegaly appreciated, bowel sounds nl  MS:   Nl gait/  ext warm without deformities, calf tenderness, cyanosis or clubbing No obvious joint restrictions   SKIN: warm and dry without lesions    NEURO:  alert, approp, nl sensorium with  no motor or cerebellar deficits apparent.        Assessment   COPD GOLD 0/ still smoking Active smoker - 03/30/2018   Walked RA  3  laps @  approx 232ft each @ avg pace  stopped due to end of study, mild sob and chest tight, no desats   - Spirometry 03/30/2018  FEV1 2.4  (94%)  Ratio 0.73 s  physiologic curvature p > 4 h since last saba  - 03/30/2018  After extensive coaching inhaler device,  effectiveness =   90% with elipta device > try trelegy sample to see if reduces need for saba      I don't really think she has significant copd at this point  but hard to be sure and reasonable to try samples available to see to what extent a "full court press" works to improve her activity tolerance or reduction in perceived saba need.   Reviewed with pt: I spent extra time with pt today reviewing appropriate use of albuterol for prn use on exertion with the following points: 1) saba is for relief of sob that does not improve by walking a slower pace or resting but rather if the pt does not improve after trying this first. 2) If the pt is convinced, as many are, that saba helps recover from activity faster then it's easy to tell if this is the case by re-challenging : ie stop, take the inhaler, then p 5 minutes try the exact same activity (intensity of workload) that just caused the symptoms and see if they are substantially diminished or not after saba 3) if there is an activity that reproducibly causes the symptoms, try the saba 15 min before the activity on alternate days   If in fact the saba really does help, then fine to continue to use it prn but advised may need to look closer at the maintenance regimen being used to achieve better control of airways disease with exertion.   >>>  F/u in 4 weeks with pfts and cxr     Chronic combined systolic and diastolic CHF (congestive heart failure) ([place]) Cath data reviewed, no evidence of PAH though she may be prone to "cardiac asthma" which may complicate interpretation of symptoms     Tobacco abuse = Cigarette  Use 4-5 min discussion re active cigarette smoking in addition to office E&M  Ask about tobacco use:   ongoing Advise quitting    I reviewed the Fletcher curve with the patient that basically indicates  if you quit smoking when your  best day FEV1 is still well preserved (as is clearly  the case here)  it is highly unlikely you will progress to severe disease and informed the patient there was  no medication on the market that has proven to alter the curve/ its downward trajectory  or the likelihood of progression of their disease(unlike other chronic medical conditions such as atheroclerosis where we do think we can change the natural hx with risk reducing meds)    Therefore stopping smoking and maintaining abstinence are  the most important aspects of care, not choice of inhalers or for that matter, doctors.  Treatment other than smoking cessation  is entirely directed by severity of symptoms and focused also on reducing exacerbations, not attempting to change the natural history of the disease.   Assess willingness:  Not committed at this point Assist in quit attempt:  Per PCP when ready Arrange follow up:   Follow up per Primary Care planned  For smoking cessation classes call [phone]       Total time devoted to counseling  > 50 % of initial 60 min office visit:  review case (including cardiac w/u) with pt/  device teaching which extended face to face time for this visit/  discussion of options/alternatives/ personally creating written customized instructions  in presence of pt  then going over those specific  Instructions directly with the pt including how to use all of the meds but in particular covering each new medication in detail and the difference between the maintenance= "automatic" meds and the prns using an action plan format for the latter (If this problem/symptom => do that organization reading Left to right).  Please see  AVS from this visit for a full list of these instructions which I personally wrote for this pt and  are unique to this visit.      Christinia Gully, MD 03/30/2018

## 2018-03-31 ENCOUNTER — Encounter: Payer: Self-pay | Admitting: Internal Medicine

## 2018-03-31 ENCOUNTER — Telehealth: Payer: Self-pay | Admitting: Physician Assistant

## 2018-03-31 NOTE — Assessment & Plan Note (Addendum)
Active smoker - 03/30/2018   Walked RA  3  laps @  approx 277ft each @ avg pace  stopped due to end of study, mild sob and chest tight, no desats   - Spirometry 03/30/2018  FEV1 2.4  (94%)  Ratio 0.73 s physiologic curvature p > 4 h since last saba  - 03/30/2018  After extensive coaching inhaler device,  effectiveness =   90% with elipta device > try trelegy sample to see if reduces need for saba    I don't really think she has significant copd at this point but hard to be sure and reasonable to try samples available to see to what extent a "full court press" works to improve her activity tolerance or reduction in perceived saba need.   Reviewed with pt: I spent extra time with pt today reviewing appropriate use of albuterol for prn use on exertion with the following points: 1) saba is for relief of sob that does not improve by walking a slower pace or resting but rather if the pt does not improve after trying this first. 2) If the pt is convinced, as many are, that saba helps recover from activity faster then it's easy to tell if this is the case by re-challenging : ie stop, take the inhaler, then p 5 minutes try the exact same activity (intensity of workload) that just caused the symptoms and see if they are substantially diminished or not after saba 3) if there is an activity that reproducibly causes the symptoms, try the saba 15 min before the activity on alternate days   If in fact the saba really does help, then fine to continue to use it prn but advised may need to look closer at the maintenance regimen being used to achieve better control of airways disease with exertion.    >>>  F/u in 4 weeks with pfts and cxr

## 2018-03-31 NOTE — Telephone Encounter (Signed)
° ° °  Patient calling to request letter stating her condition/diagnosis to keep her employment. Patient states she plans to return to work on 2/24 FMLA denied, but is requesting to keep her job.

## 2018-03-31 NOTE — Assessment & Plan Note (Signed)
Cath data reviewed, no evidence of PAH though she may be prone to "cardiac asthma" which may complicate interpretation of symptoms

## 2018-03-31 NOTE — Assessment & Plan Note (Signed)
4-5 min discussion re active cigarette smoking in addition to office E&M  Ask about tobacco use:   ongoing Advise quitting     I reviewed the Fletcher curve with the patient that basically indicates  if you quit smoking when your best day FEV1 is still well preserved (as is clearly  the case here)  it is highly unlikely you will progress to severe disease and informed the patient there was  no medication on the market that has proven to alter the curve/ its downward trajectory  or the likelihood of progression of their disease(unlike other chronic medical conditions such as atheroclerosis where we do think we can change the natural hx with risk reducing meds)    Therefore stopping smoking and maintaining abstinence are  the most important aspects of care, not choice of inhalers or for that matter, doctors.   Treatment other than smoking cessation  is entirely directed by severity of symptoms and focused also on reducing exacerbations, not attempting to change the natural history of the disease.   Assess willingness:  Not committed at this point Assist in quit attempt:  Per PCP when ready Arrange follow up:   Follow up per Primary Care planned  For smoking cessation classes call [phone]                    

## 2018-04-01 NOTE — Telephone Encounter (Signed)
There is a letter already in her chart to return to work on 04/11/2018.  Please provide her with that letter.  Let me know if she needs something else. Richardson Dopp, PA-C    04/01/2018 5:36 PM

## 2018-04-04 NOTE — Telephone Encounter (Signed)
I left a message for the patient to return my call.  A letter is being sent to the last known home address.

## 2018-04-11 ENCOUNTER — Telehealth: Payer: Self-pay | Admitting: Physician Assistant

## 2018-04-11 DIAGNOSIS — J449 Chronic obstructive pulmonary disease, unspecified: Secondary | ICD-10-CM | POA: Diagnosis not present

## 2018-04-11 DIAGNOSIS — J029 Acute pharyngitis, unspecified: Secondary | ICD-10-CM | POA: Diagnosis not present

## 2018-04-11 DIAGNOSIS — I10 Essential (primary) hypertension: Secondary | ICD-10-CM | POA: Diagnosis not present

## 2018-04-11 NOTE — Telephone Encounter (Signed)
Ok to provide a letter to remain out of work until next follow up appointment. Richardson Dopp, PA-C    04/11/2018 1:52 PM

## 2018-04-11 NOTE — Telephone Encounter (Signed)
Patient called stating she has a letter stating she can be out from work until today.  However she is stating she still is not feeling well and is wondering if she can have another letter extending her time out of work.

## 2018-04-12 NOTE — Research (Signed)
PHDEV Informed Consent -LATE ENTRY                 Subject Name:   Mary Franco   Subject met inclusion and exclusion criteria.  The informed consent form, study requirements and expectations were reviewed with the subject and questions and concerns were addressed prior to the signing of the consent form.  The subject verbalized understanding of the trial requirements.  The subject agreed to participate in the [facility] trial and signed the informed consent.  The informed consent was obtained prior to performance of any protocol-specific procedures for the subject.  A copy of the signed informed consent was given to the subject and a copy was placed in the subject's medical record. This patient was consented by Rubie Maid on 03-08-2018 at 08:45 a.m.   [place] Nyimah Shadduck, Research Assistant  03/08/2018 08:45 A.M.

## 2018-04-13 ENCOUNTER — Encounter: Payer: Self-pay | Admitting: Physician Assistant

## 2018-04-13 ENCOUNTER — Ambulatory Visit: Payer: Commercial Managed Care - PPO | Admitting: Physician Assistant

## 2018-04-13 VITALS — BP 142/90 | HR 76 | Ht 62.0 in | Wt 129.0 lb

## 2018-04-13 DIAGNOSIS — I10 Essential (primary) hypertension: Secondary | ICD-10-CM

## 2018-04-13 DIAGNOSIS — E039 Hypothyroidism, unspecified: Secondary | ICD-10-CM

## 2018-04-13 DIAGNOSIS — I5042 Chronic combined systolic (congestive) and diastolic (congestive) heart failure: Secondary | ICD-10-CM | POA: Diagnosis not present

## 2018-04-13 DIAGNOSIS — J449 Chronic obstructive pulmonary disease, unspecified: Secondary | ICD-10-CM

## 2018-04-13 DIAGNOSIS — I509 Heart failure, unspecified: Secondary | ICD-10-CM | POA: Diagnosis not present

## 2018-04-13 DIAGNOSIS — Z720 Tobacco use: Secondary | ICD-10-CM | POA: Diagnosis not present

## 2018-04-13 DIAGNOSIS — I4891 Unspecified atrial fibrillation: Secondary | ICD-10-CM | POA: Diagnosis not present

## 2018-04-13 DIAGNOSIS — K219 Gastro-esophageal reflux disease without esophagitis: Secondary | ICD-10-CM

## 2018-04-13 DIAGNOSIS — I639 Cerebral infarction, unspecified: Secondary | ICD-10-CM | POA: Diagnosis not present

## 2018-04-13 DIAGNOSIS — E669 Obesity, unspecified: Secondary | ICD-10-CM

## 2018-04-13 MED ORDER — LOSARTAN POTASSIUM 25 MG PO TABS: 25.0000 mg | ORAL_TABLET | Freq: Every day | ORAL | 3 refills | Status: DC

## 2018-04-13 NOTE — Progress Notes (Signed)
Cardiology Office Note:    Date:  04/13/2018   ID:  Mary Franco, DOB 04-Feb-1966, MRN [id number]  PCP:  Sarajane Jews, FNP  Cardiologist:  Mertie Moores, MD   Electrophysiologist:  None   Referring MD: Sarajane Jews, FNP   Chief Complaint  Patient presents with  . Follow-up    CHF     History of Present Illness:    Mary Franco is a 53 y.o. female with chronic combined systolic and diastolic heart failure secondary to nonischemic cardiomyopathy, tobacco and alcohol abuse, acid reflux.  She was admitted in January 2020 with acute heart failure.  EF was 40-45 and cardiac catheterization demonstrated normal coronary arteries.  She was last seen 03/25/2018 for posthospitalization follow-up.  She continued to have symptoms of shortness of breath with activity as well as chest tightness.  Since last seen, she has been evaluated by Dr. Melvyn Novas with pulmonology.  PFTs do not suggest severe COPD.  She has been placed on a trial of inhaled medications.   Mary Franco returns for follow up.  She is here alone.  She did feel that the inhaled meds helped her breathing.  However, she developed thrush.  She feels her breathing has been better.  She notes chest pain with anxiety and stress.  She denies paroxysmal nocturnal dyspnea, orthopnea, edema, syncope.  She is still smoking.   Prior CV studies:   The following studies were reviewed today:  R/L Cardiac Catheterization 03/08/18 Normal coronary arteries Elevated LV EDP consistent with volume overload. Right atrial pressure- 5/3 Right ventricular pressure- 34/2 Pulmonary artery pressure- 35/17, mean 25 Pulmonary wedge pressure- A-wave 19, V wave 17, mean 15 LVEDP- 20 Cardiac output (Fick)-6 L/min  Echocardiogram 02/2018 [facility]) EF 40-45, mild LAE,?  PFO/small ASD, mild MR, moderate TR, RVSP 51 (moderate pulmonary hypertension)  Past Medical History:  Diagnosis Date  . Alcohol abuse   . Anxiety   . Asthma   . Cervical  strain 04/16/2014  . Cervicogenic headache 04/16/2014  . COPD (chronic obstructive pulmonary disease) ([place])   . Fibromyalgia   . Migraines    Surgical Hx: The patient  has a past surgical history that includes Tubal ligation; Appendectomy; Knee surgery (Left); and RIGHT/LEFT HEART CATH AND CORONARY ANGIOGRAPHY (N/A, 03/08/2018).   Current Medications: Current Meds  Medication Sig  . alprazolam (XANAX) 2 MG tablet Take 2 mg by mouth at bedtime as needed.  Marland Kitchen amoxicillin (AMOXIL) 875 MG tablet Take 875 mg by mouth 2 (two) times daily.  . benzonatate (TESSALON) 100 MG capsule Take 100 mg by mouth every 4 (four) hours as needed for cough. For ten days. Do not exceed 600mg  per day.  . bisoprolol (ZEBETA) 5 MG tablet Take 2.5 mg daily for 1 week and then take 5 mg daily.  . carboxymethylcellulose (REFRESH PLUS) 0.5 % SOLN Place 1 drop into both eyes daily as needed (dry eyes).  Marland Kitchen esomeprazole (NEXIUM) 20 MG capsule Take 40 mg by mouth daily at 12 noon.  . Fluticasone-Umeclidin-Vilant (TRELEGY ELLIPTA) 100-62.5-25 MCG/INH AEPB Inhale 1 puff into the lungs daily.  Marland Kitchen ibuprofen (ADVIL,MOTRIN) 200 MG tablet Take 600 mg by mouth every 6 (six) hours as needed for headache or moderate pain.  . Magnesium 500 MG TABS Take 1 tablet by mouth daily.  . Melatonin 5 MG TABS Take 5 mg by mouth at bedtime as needed (sleep).  . nystatin (MYCOSTATIN) 100000 UNIT/ML suspension Take 4 mLs by mouth 4 (four) times daily.  Marland Kitchen  predniSONE (DELTASONE) 20 MG tablet Take 20 mg by mouth 2 (two) times daily with a meal.  . PROAIR HFA 108 (90 BASE) MCG/ACT inhaler as needed.  . promethazine (PHENERGAN) 25 MG tablet Take 1 tablet (25 mg total) by mouth every 8 (eight) hours as needed for nausea.  Marland Kitchen spironolactone (ALDACTONE) 25 MG tablet Take 0.5 tablets (12.5 mg total) by mouth daily.  Marland Kitchen trimethoprim-polymyxin b (POLYTRIM) ophthalmic solution Place 1 drop into both eyes 2 (two) times daily.     Allergies:   Codeine and Sulfa  antibiotics   Social History   Tobacco Use  . Smoking status: Current Every Day Smoker    Packs/day: 2.00    Years: 40.00    Pack years: 80.00    Types: Cigarettes  . Smokeless tobacco: Never Used  . Tobacco comment: .5ppd  Substance Use Topics  . Alcohol use: Yes    Comment: 2 x per week  . Drug use: No     Family Hx: The patient's family history includes Cancer in her mother; Colitis in her paternal aunt; Ovarian cancer in her maternal grandmother.  ROS:   Please see the history of present illness.    Review of Systems  Constitution: Positive for malaise/fatigue.  HENT: Positive for hearing loss.   Cardiovascular: Positive for chest pain and irregular heartbeat.  Respiratory: Positive for cough, shortness of breath and wheezing.   Hematologic/Lymphatic: Bruises/bleeds easily.  Musculoskeletal: Positive for back pain.  Neurological: Positive for headaches.  Psychiatric/Behavioral: Positive for depression. The patient is nervous/anxious.    All other systems reviewed and are negative.   EKGs/Labs/Other Test Reviewed:    EKG:  EKG is not ordered today.    Recent Labs: 03/07/2018: B Natriuretic Peptide 1,594.7 03/09/2018: Hemoglobin 12.8; Platelets 402 03/25/2018: BUN 12; Creatinine, Ser 0.88; Potassium 4.0; Sodium 136   Recent Lipid Panel No results found for: CHOL, TRIG, HDL, CHOLHDL, LDLCALC, LDLDIRECT  Physical Exam:    VS:  BP (!) 142/90   Pulse 76   Ht 5\' 2"  (1.575 m)   Wt 129 lb (58.5 kg)   BMI 23.59 kg/m     Wt Readings from Last 3 Encounters:  04/13/18 129 lb (58.5 kg)  03/30/18 127 lb 6.4 oz (57.8 kg)  03/25/18 126 lb (57.2 kg)     Physical Exam  Constitutional: She is oriented to person, place, and time. She appears well-developed and well-nourished. No distress.  HENT:  Head: Normocephalic and atraumatic.  Eyes: No scleral icterus.  Neck: No JVD present. No thyromegaly present.  Cardiovascular: Normal rate and regular rhythm.  No murmur  heard. Pulmonary/Chest: Effort normal. She has no rales.  Abdominal: Soft. She exhibits no distension.  Musculoskeletal:        General: No edema.  Lymphadenopathy:    She has no cervical adenopathy.  Neurological: She is alert and oriented to person, place, and time.  Skin: Skin is warm and dry.  Psychiatric: She has a normal mood and affect.    ASSESSMENT & PLAN:    Chronic combined systolic and diastolic CHF (congestive heart failure) (HCC) EF 40-45.  NYHA 2. Volume status appears stable.  Her BP can tolerate further titration of medications for CHF.  From a CHF standpoint, she can return to work now.  If she needs to remain out of work for anxiety or COPD, she will have to FU with her PCP or pulmonologist.  -Continue current dose of Spironolactone, Bisoprolol    -Start Losartan 25 mg  QD.    -BMET 2 weeks.   -FU with me 4-6 weeks to adjust CHF meds  -FU with Dr. Acie Fredrickson in 4 mos with limited echo prior to appt to recheck EF  -Short term disability paperwork was turned in today and completed by me.   Essential hypertension BP above goal. Add Losartan as noted.  Tobacco abuse She is trying to quit.   COPD GOLD 0/ still smoking FU with Dr. Melvyn Novas as planned.   Dispo:  Return in about 4 weeks (around 05/11/2018) for Routine Follow Up, w/ Richardson Dopp, PA-C.   Medication Adjustments/Labs and Tests Ordered: Current medicines are reviewed at length with the patient today.  Concerns regarding medicines are outlined above.  Tests Ordered: Orders Placed This Encounter  Procedures  . Basic metabolic panel  . ECHOCARDIOGRAM LIMITED   Medication Changes: Meds ordered this encounter  Medications  . losartan (COZAAR) 25 MG tablet    Sig: Take 1 tablet (25 mg total) by mouth daily.    Dispense:  90 tablet    Refill:  3    Signed, Richardson Dopp, PA-C  04/13/2018 4:21 PM    [facility] [place], [place], [place]  [address] Phone: [phone]; Fax:  [phone]

## 2018-04-13 NOTE — Telephone Encounter (Signed)
Pt was seen in the office today and pt states she received a letter for PCP as well about a letter for work.

## 2018-04-13 NOTE — Patient Instructions (Addendum)
Medication Instructions: Your physician has recommended you make the following change in your medication:  1. START LOSARTAN 25 MG DAILY.   If you need a refill on your cardiac medications before your next appointment, please call your pharmacy.   Lab work: TO BE DONE IN 2 WEEKS: BMET  If you have labs (blood work) drawn today and your tests are completely normal, you will receive your results only by: Marland Kitchen MyChart Message (if you have MyChart) OR . A paper copy in the mail If you have any lab test that is abnormal or we need to change your treatment, we will call you to review the results.  Testing/Procedures: Your physician has requested that you have an echocardiogram. Echocardiography is a painless test that uses sound waves to create images of your heart. It provides your doctor with information about the size and shape of your heart and how well your heart's chambers and valves are working. This procedure takes approximately one hour. There are no restrictions for this procedure. TO BE DONE ONE WEEK BEFORE APPOINTMENT WITH DR. Acie Fredrickson.   Follow-Up: At [facility], you and your health needs are our priority.  As part of our continuing mission to provide you with exceptional heart care, we have created designated Provider Care Teams.  These Care Teams include your primary Cardiologist (physician) and Advanced Practice Providers (APPs -  Physician Assistants and Nurse Practitioners) who all work together to provide you with the care you need, when you need it. . You will need a follow up appointment in:  4 months.    You may see Mertie Moores, MD  . Your physician recommends that you schedule a follow-up appointment in: 4-6 weeks with Richardson Dopp, PA-C   Any Other Special Instructions Will Be Listed Below (If Applicable).

## 2018-04-14 ENCOUNTER — Encounter: Payer: Self-pay | Admitting: *Deleted

## 2018-04-14 ENCOUNTER — Ambulatory Visit (INDEPENDENT_AMBULATORY_CARE_PROVIDER_SITE_OTHER)
Admission: RE | Admit: 2018-04-14 | Discharge: 2018-04-14 | Disposition: A | Discharge status: Home / Self Care | Payer: Commercial Managed Care - PPO | Source: Ambulatory Visit | Attending: Internal Medicine | Admitting: Internal Medicine

## 2018-04-14 ENCOUNTER — Ambulatory Visit: Payer: Commercial Managed Care - PPO | Admitting: Internal Medicine

## 2018-04-14 ENCOUNTER — Encounter: Payer: Self-pay | Admitting: Internal Medicine

## 2018-04-14 VITALS — BP 140/80 | HR 78 | Ht 62.0 in | Wt 130.0 lb

## 2018-04-14 DIAGNOSIS — F17210 Nicotine dependence, cigarettes, uncomplicated: Secondary | ICD-10-CM

## 2018-04-14 DIAGNOSIS — J449 Chronic obstructive pulmonary disease, unspecified: Secondary | ICD-10-CM | POA: Diagnosis not present

## 2018-04-14 DIAGNOSIS — G459 Transient cerebral ischemic attack, unspecified: Secondary | ICD-10-CM

## 2018-04-14 DIAGNOSIS — E1122 Type 2 diabetes mellitus with diabetic chronic kidney disease: Secondary | ICD-10-CM

## 2018-04-14 DIAGNOSIS — N179 Acute kidney failure, unspecified: Secondary | ICD-10-CM | POA: Diagnosis not present

## 2018-04-14 DIAGNOSIS — R0602 Shortness of breath: Secondary | ICD-10-CM | POA: Diagnosis not present

## 2018-04-14 DIAGNOSIS — N390 Urinary tract infection, site not specified: Secondary | ICD-10-CM

## 2018-04-14 DIAGNOSIS — E1165 Type 2 diabetes mellitus with hyperglycemia: Secondary | ICD-10-CM

## 2018-04-14 MED ORDER — BUDESONIDE-FORMOTEROL FUMARATE 80-4.5 MCG/ACT IN AERO: 2.0000 | INHALATION_SPRAY | Freq: Two times a day (BID) | RESPIRATORY_TRACT | 0 refills | Status: DC

## 2018-04-14 MED ORDER — BUDESONIDE-FORMOTEROL FUMARATE 80-4.5 MCG/ACT IN AERO: INHALATION_SPRAY | RESPIRATORY_TRACT | 11 refills | Status: DC

## 2018-04-14 NOTE — Progress Notes (Signed)
Mary Franco, female    DOB: [birth year],     MRN: [id number]   Brief patient profile:  87 yowf active smoker with healthy childhood and last IUP  Age 53 with tendency to sinus infections/ bronchitis starting around 2005 couple times a year occ needed neb that she borrowed from relative but worse since winter  2019 dx with by Nahser with chf some better then in January 19th to [place] then to [facility] then home and says never walked in hallway and maybe 50-60% better but could not get to [place] / nor shop at [employer] and they started smoking and no nebulizer broke    R/L [facility] 03/08/18 EF 40 LVEDP 20 with CO 6lpm   PA mean 25/wedge 15 so PVR = 10/6 = wnl   History of Present Illness  03/30/2018  Pulmonary/ 1st office eval/Wert  Chief Complaint  Patient presents with  . Consult    Referred by Dr. Hassell Done due to wheezing, coughing, chest tightness and congestion. Pt states when she coughs, she is unable to get any mucus to come up.  Dyspnea:  MMRC3 = can't walk 100 yards even at a slow pace at a flat grade s stopping due to sob   Cough: nothing much now, min mucoid in ams Sleep: maybe 30 degrees due to HB SABA use: 4 x daily, rarely bedtime rec You do not appear to have bad copd  Trelegy one click each am to see if breathingh better and less need for albuterol Only use your albuterol as a rescue medication Please schedule a follow up office visit in 4 weeks, sooner if needed  with all medications /inhalers/ solutions in hand     04/14/2018  f/u ov/Wert re:  COPD GOLD 0 / still smoking / no meds in hand as rec Chief Complaint  Patient presents with  . Follow-up    recent strep throat infection, on pred & amoxicillin, took last dose of Trelegy dose yesterday; used rescue inhaler infrequently until today (used it 3x today)  Dyspnea: much better on trelegy but throat staying irritated Cough: minimal dy  Sleeping: 1 pillow SABA use  None until day of p ran out / last dose 3 h prior to OV   02:  none   No obvious day to day or daytime variability or assoc excess/ purulent sputum or mucus plugs or hemoptysis or cp or chest tightness, subjective wheeze or overt sinus or hb symptoms.   Sleeping as above without nocturnal  or early am exacerbation  of respiratory  c/o's or need for noct saba. Also denies any obvious fluctuation of symptoms with weather or environmental changes or other aggravating or alleviating factors except as outlined above   No unusual exposure hx or h/o childhood pna/ asthma or knowledge of premature birth.  Current Allergies, Complete Past Medical History, Past Surgical History, Family History, and Social History were reviewed in [employer] record.  ROS  The following are not active complaints unless bolded Hoarseness, sore throat, dysphagia, dental problems, itching, sneezing,  nasal congestion or discharge of excess mucus or purulent secretions, ear ache,   fever, chills, sweats, unintended wt loss or wt gain, classically pleuritic or exertional cp,  orthopnea pnd or arm/hand swelling  or leg swelling, presyncope, palpitations, abdominal pain, anorexia, nausea, vomiting, diarrhea  or change in bowel habits or change in bladder habits, change in stools or change in urine, dysuria, hematuria,  rash, arthralgias, visual complaints, headache, numbness, weakness  or ataxia or problems with walking or coordination,  change in mood or  memory.        Current Meds  Medication Sig  . alprazolam (XANAX) 2 MG tablet Take 2 mg by mouth at bedtime as needed.  Marland Kitchen amoxicillin (AMOXIL) 875 MG tablet Take 875 mg by mouth daily. Taking 1 tablet daily for 10 days as of 04/11/2018  . benzonatate (TESSALON) 100 MG capsule Take 100 mg by mouth every 4 (four) hours as needed for cough. For ten days. Do not exceed 600mg  per day.  . bisoprolol (ZEBETA) 5 MG tablet Take 2.5 mg daily for 1 week and then take 5 mg daily.  . carboxymethylcellulose (REFRESH PLUS) 0.5 % SOLN  Place 1 drop into both eyes daily as needed (dry eyes).  Marland Kitchen esomeprazole (NEXIUM) 20 MG capsule Take 40 mg by mouth daily at 12 noon.  . Fluticasone-Umeclidin-Vilant (TRELEGY ELLIPTA) 100-62.5-25 MCG/INH AEPB Inhale 1 puff into the lungs daily.  Marland Kitchen ibuprofen (ADVIL,MOTRIN) 200 MG tablet Take 600 mg by mouth every 6 (six) hours as needed for headache or moderate pain.  Marland Kitchen losartan (COZAAR) 25 MG tablet Take 1 tablet (25 mg total) by mouth daily.  . Magnesium 500 MG TABS Take 1 tablet by mouth daily.  . Melatonin 5 MG TABS Take 5 mg by mouth at bedtime as needed (sleep).  . nystatin (MYCOSTATIN) 100000 UNIT/ML suspension Take 4 mLs by mouth 4 (four) times daily.  . predniSONE (DELTASONE) 20 MG tablet Take 20 mg by mouth 2 (two) times daily with a meal.  . PROAIR HFA 108 (90 BASE) MCG/ACT inhaler as needed.  . promethazine (PHENERGAN) 25 MG tablet Take 1 tablet (25 mg total) by mouth every 8 (eight) hours as needed for nausea.  Marland Kitchen spironolactone (ALDACTONE) 25 MG tablet Take 0.5 tablets (12.5 mg total) by mouth daily.  Marland Kitchen trimethoprim-polymyxin b (POLYTRIM) ophthalmic solution Place 1 drop into both eyes 2 (two) times daily.               Objective:     amb talkative slt hoarse wf nad  Wt Readings from Last 3 Encounters:  04/14/18 130 lb (59 kg)  04/13/18 129 lb (58.5 kg)  03/30/18 127 lb 6.4 oz (57.8 kg)     Vital signs reviewed - Note on arrival 02 sats  98% on RA   HEENT: nl dentition, turbinates bilaterally,  Oropharynx ok but Pos thrush  Nl external ear canals without cough reflex   NECK :  without JVD/Nodes/TM/ nl carotid upstrokes bilaterally   LUNGS: no acc muscle use,  Nl contour chest which is clear to A and P bilaterally without cough on insp or exp maneuvers   CV:  RRR  no s3 or murmur or increase in P2, and no edema   ABD:  soft and nontender with nl inspiratory excursion in the supine position. No bruits or organomegaly appreciated, bowel sounds nl  MS:  Nl gait/  ext warm without deformities, calf tenderness, cyanosis or clubbing No obvious joint restrictions   SKIN: warm and dry without lesions    NEURO:  alert, approp, nl sensorium with  no motor or cerebellar deficits apparent.      CXR PA and Lateral:   04/14/2018 :    I personally reviewed images and agree with radiology impression as follows:   No edema or consolidation.         Assessment

## 2018-04-14 NOTE — Patient Instructions (Addendum)
Plan A = Automatic = Symbicort 80 Take 2 puffs first thing in am and then another 2 puffs about 12 hours later.   Work on inhaler technique:  relax and gently blow all the way out then take a nice smooth deep breath back in, triggering the inhaler at same time you start breathing in.  Hold for up to 5 seconds if you can. Blow out thru nose. Rinse and gargle with water when done   Plan B = Backup Only use your albuterol inhaler as a rescue medication to be used if you can't catch your breath by resting or doing a relaxed purse lip breathing pattern.  - The less you use it, the better it will work when you need it. - Ok to use the inhaler up to 2 puffs  every 4 hours if you must but call for appointment if use goes up over your usual need - Don't leave home without it !!  (think of it like the spare tire for your car)    Please remember to go to the   x-ray department   for your tests - we will call you with the results when they are available.   Please schedule a follow up office visit in 6 weeks, call sooner if needed with pfts on return off the symbicort that day

## 2018-04-15 DIAGNOSIS — E1165 Type 2 diabetes mellitus with hyperglycemia: Secondary | ICD-10-CM | POA: Diagnosis not present

## 2018-04-15 DIAGNOSIS — N179 Acute kidney failure, unspecified: Secondary | ICD-10-CM | POA: Diagnosis not present

## 2018-04-15 DIAGNOSIS — E1122 Type 2 diabetes mellitus with diabetic chronic kidney disease: Secondary | ICD-10-CM | POA: Diagnosis not present

## 2018-04-15 DIAGNOSIS — G459 Transient cerebral ischemic attack, unspecified: Secondary | ICD-10-CM | POA: Diagnosis not present

## 2018-04-15 NOTE — Progress Notes (Signed)
Spoke with pt and notified of results per Dr. Wert. Pt verbalized understanding and denied any questions. 

## 2018-04-16 ENCOUNTER — Encounter: Payer: Self-pay | Admitting: Internal Medicine

## 2018-04-16 DIAGNOSIS — E1122 Type 2 diabetes mellitus with diabetic chronic kidney disease: Secondary | ICD-10-CM | POA: Diagnosis not present

## 2018-04-16 DIAGNOSIS — G459 Transient cerebral ischemic attack, unspecified: Secondary | ICD-10-CM | POA: Diagnosis not present

## 2018-04-16 DIAGNOSIS — E1165 Type 2 diabetes mellitus with hyperglycemia: Secondary | ICD-10-CM | POA: Diagnosis not present

## 2018-04-16 DIAGNOSIS — N179 Acute kidney failure, unspecified: Secondary | ICD-10-CM | POA: Diagnosis not present

## 2018-04-16 NOTE — Assessment & Plan Note (Signed)
Active smoker - 03/30/2018   Walked RA  3  laps @  approx 235ft each @ avg pace  stopped due to end of study, mild sob and chest tight, no desats   - Spirometry 03/30/2018  FEV1 2.4  (94%)  Ratio 0.73 s physiologic curvature p > 4 h since last saba  - 03/30/2018    try trelegy sample to see if reduces need for saba   - 04/14/2018  After extensive coaching inhaler device,  effectiveness =    75% from basline 50% > try symb 80 2bid   She is more ab than copd and main issues are now cough not sob so should do better on low dose symbicort if improves with inhalation of meds and decreases inhalation of cigs (see separate a/p)    rec f/u in 6 weeks with full pfts

## 2018-04-16 NOTE — Assessment & Plan Note (Signed)

## 2018-04-19 ENCOUNTER — Other Ambulatory Visit: Payer: Self-pay

## 2018-04-19 ENCOUNTER — Telehealth: Payer: Self-pay | Admitting: *Deleted

## 2018-04-19 ENCOUNTER — Emergency Department (HOSPITAL_COMMUNITY): Payer: Commercial Managed Care - PPO

## 2018-04-19 ENCOUNTER — Emergency Department (HOSPITAL_COMMUNITY)
Admission: EM | Admit: 2018-04-19 | Discharge: 2018-04-19 | Disposition: A | Discharge status: Home / Self Care | Payer: Commercial Managed Care - PPO | Attending: Emergency Medicine | Admitting: Emergency Medicine

## 2018-04-19 ENCOUNTER — Encounter (HOSPITAL_COMMUNITY): Payer: Self-pay

## 2018-04-19 DIAGNOSIS — J069 Acute upper respiratory infection, unspecified: Secondary | ICD-10-CM | POA: Diagnosis not present

## 2018-04-19 DIAGNOSIS — J449 Chronic obstructive pulmonary disease, unspecified: Secondary | ICD-10-CM | POA: Diagnosis not present

## 2018-04-19 DIAGNOSIS — J40 Bronchitis, not specified as acute or chronic: Secondary | ICD-10-CM | POA: Insufficient documentation

## 2018-04-19 DIAGNOSIS — J45909 Unspecified asthma, uncomplicated: Secondary | ICD-10-CM | POA: Diagnosis not present

## 2018-04-19 DIAGNOSIS — R079 Chest pain, unspecified: Secondary | ICD-10-CM | POA: Diagnosis not present

## 2018-04-19 DIAGNOSIS — F17210 Nicotine dependence, cigarettes, uncomplicated: Secondary | ICD-10-CM | POA: Insufficient documentation

## 2018-04-19 DIAGNOSIS — R05 Cough: Secondary | ICD-10-CM | POA: Diagnosis present

## 2018-04-19 MED ORDER — FLUTICASONE PROPIONATE 50 MCG/ACT NA SUSP: 2.0000 | Freq: Every day | NASAL | 0 refills | Status: DC

## 2018-04-19 MED ORDER — DEXAMETHASONE SODIUM PHOSPHATE 10 MG/ML IJ SOLN
10.0000 mg | Freq: Once | INTRAMUSCULAR | Status: AC
  Administered 2018-04-19: 10 mg via INTRAMUSCULAR
  Filled 2018-04-19: qty 1

## 2018-04-19 NOTE — Discharge Instructions (Signed)

## 2018-04-19 NOTE — ED Provider Notes (Signed)
Emergency Department Provider Note   I have reviewed the triage vital signs and the nursing notes.   HISTORY  Chief Complaint URI   HPI Mary Franco is a 53 y.o. female with past medical history of COPD presents with congestion, cough, earache.  Patient has had 1 week of symptoms.  She was started on antibiotics and steroid with no relief in symptoms.  She does have some mild shortness of breath but feels this is well controlled at home with her albuterol treatments.  She has not experienced fevers, body aches.  No international travel.  No known sick contacts.  She states that her left ear pain has worsened.  No drainage from the ear.  She does have mild sore throat.   Past Medical History:  Diagnosis Date  . Alcohol abuse   . Anxiety   . Asthma   . Cervical strain 04/16/2014  . Cervicogenic headache 04/16/2014  . COPD (chronic obstructive pulmonary disease) ([place])   . Fibromyalgia   . Migraines     Patient Active Problem List   Diagnosis Date Noted  . COPD GOLD 0/ still smoking 03/30/2018  . Chronic combined systolic and diastolic CHF (congestive heart failure) ([place]) 03/25/2018  . NICM (nonischemic cardiomyopathy) ([place]) 03/25/2018  . Hypokalemia   . Acute hypoxemic respiratory failure ([place]) 03/07/2018  . Acute bronchitis 03/07/2018  . Cigarette smoker 03/07/2018  . Elevated troponin 03/07/2018  . Cervical strain 04/16/2014  . Cervicogenic headache 04/16/2014    Past Surgical History:  Procedure Laterality Date  . APPENDECTOMY    . KNEE SURGERY Left   . RIGHT/LEFT HEART CATH AND CORONARY ANGIOGRAPHY N/A 03/08/2018   Procedure: RIGHT/LEFT HEART CATH AND CORONARY ANGIOGRAPHY;  Surgeon: Lorretta Harp, MD;  Location: [facility];  Service: Cardiovascular;  Laterality: N/A;  . TUBAL LIGATION      Allergies Codeine and Sulfa antibiotics  Family History  Problem Relation Age of Onset  . Cancer Mother   . Colitis Paternal Aunt   . Ovarian cancer  Maternal Grandmother     Social History Social History   Tobacco Use  . Smoking status: Current Every Day Smoker    Packs/day: 2.00    Years: 40.00    Pack years: 80.00    Types: Cigarettes  . Smokeless tobacco: Never Used  . Tobacco comment: as of 04/14/2018 down to 10 cigs/day  Substance Use Topics  . Alcohol use: Yes    Comment: 2 x per week  . Drug use: No    Review of Systems  Constitutional: No fever/chills Eyes: No visual changes. ENT: Positive mild sore throat and left ear pain.  Cardiovascular: Denies chest pain. Respiratory: Denies shortness of breath. Positive cough.  Gastrointestinal: No abdominal pain.  No nausea, no vomiting.  No diarrhea.  No constipation. Genitourinary: Negative for dysuria. Musculoskeletal: Negative for back pain. Skin: Negative for rash. Neurological: Negative for headaches, focal weakness or numbness.  10-point ROS otherwise negative.  ____________________________________________   PHYSICAL EXAM:  VITAL SIGNS: ED Triage Vitals  Enc Vitals Group     BP 04/19/18 1358 112/69     Pulse Rate 04/19/18 1358 73     Resp 04/19/18 1358 16     Temp 04/19/18 1358 98.1 F (36.7 C)     Temp Source 04/19/18 1358 Oral     SpO2 04/19/18 1358 97 %     Pain Score 04/19/18 1401 8   Constitutional: Alert and oriented. Well appearing and in no  acute distress. Eyes: Conjunctivae are normal. Head: Atraumatic. Ears:  Healthy appearing ear canals bilaterally. Fluid behind the left TM without bulging and/or erythema.  Nose: No congestion/rhinnorhea. Mouth/Throat: Mucous membranes are moist.  Neck: No stridor. Cardiovascular: Normal rate, regular rhythm. Good peripheral circulation. Grossly normal heart sounds.   Respiratory: Normal respiratory effort.  No retractions. Lungs CTAB. Gastrointestinal: Soft and nontender. No distention.  Musculoskeletal: No lower extremity tenderness nor edema. No gross deformities of extremities. Neurologic:  Normal  speech and language. No gross focal neurologic deficits are appreciated.  Skin:  Skin is warm, dry and intact. No rash noted.  ____________________________________________  RADIOLOGY  Dg Chest 2 View  Result Date: 04/19/2018 CLINICAL DATA:  Dry cough. Central chest pains. Smoker. EXAM: CHEST - 2 VIEW COMPARISON:  04/14/2018. FINDINGS: Normal sized heart. Stable hyperexpansion of the lungs and mild peribronchial thickening. Unremarkable bones. IMPRESSION: No acute disease. Stable changes of COPD and chronic bronchitis. Electronically Signed   By: Claudie Revering M.D.   On: 04/19/2018 15:18    ____________________________________________   PROCEDURES  Procedure(s) performed:   Procedures  None ____________________________________________   INITIAL IMPRESSION / ASSESSMENT AND PLAN / ED COURSE  Pertinent labs & imaging results that were available during my care of the patient were reviewed by me and considered in my medical decision making (see chart for details).  Patient presents to the emergency department for evaluation of cough, congestion, left ear pain.  Lungs are clear with no significant wheezing, increased work of breathing, hypoxemia.  Doubt significant COPD exacerbation.  Plan for Decadron here but mainly discussed supportive care.  The patient's left TM does have fluid and some opacity but no clear sign of infection.  Suspect viral process.  Discussed likely improved symptoms with smoking cessation which the patient is discussing with her PCP later this week.  Also discussed emergency department return precautions.  Chest x-ray reviewed with no acute findings.   ____________________________________________  FINAL CLINICAL IMPRESSION(S) / ED DIAGNOSES  Final diagnoses:  Upper respiratory tract infection, unspecified type  Bronchitis     MEDICATIONS GIVEN DURING THIS VISIT:  Medications  dexamethasone (DECADRON) injection 10 mg (10 mg Intramuscular Given 04/19/18 1607)       NEW OUTPATIENT MEDICATIONS STARTED DURING THIS VISIT:  Discharge Medication List as of 04/19/2018  3:37 PM    START taking these medications   Details  fluticasone (FLONASE) 50 MCG/ACT nasal spray Place 2 sprays into both nostrils daily for 7 days., Starting Tue 04/19/2018, Until Tue 04/26/2018, Print        Note:  This document was prepared using Dragon voice recognition software and may include unintentional dictation errors.  Nanda Quinton, MD Emergency Medicine    Long, Wonda Olds, MD 04/20/18 1017

## 2018-04-19 NOTE — Telephone Encounter (Signed)
LMOVM TO PT THAT SHORT TERM DISABILITY FORMS ARE COMPLETE AND WANTED TO KNOW IF SHE NEEDED A PERSONAL COPY   DOCUMENTS WILL FAX TO FAX NUMBER PROVIDED ON DOCUMENTATION

## 2018-04-19 NOTE — ED Triage Notes (Signed)
Pt reports persistent URI symptoms including cough, sore throat, earache. Afebrile in triage, no distress noted.

## 2018-04-19 NOTE — ED Notes (Signed)
Declined W/C at D/C and was escorted to lobby by RN. 

## 2018-04-22 DIAGNOSIS — H9202 Otalgia, left ear: Secondary | ICD-10-CM | POA: Diagnosis not present

## 2018-04-22 DIAGNOSIS — R509 Fever, unspecified: Secondary | ICD-10-CM | POA: Diagnosis not present

## 2018-04-22 DIAGNOSIS — J449 Chronic obstructive pulmonary disease, unspecified: Secondary | ICD-10-CM | POA: Diagnosis not present

## 2018-04-25 ENCOUNTER — Other Ambulatory Visit: Payer: Commercial Managed Care - PPO | Admitting: *Deleted

## 2018-04-25 DIAGNOSIS — I5042 Chronic combined systolic (congestive) and diastolic (congestive) heart failure: Secondary | ICD-10-CM

## 2018-04-25 DIAGNOSIS — I10 Essential (primary) hypertension: Secondary | ICD-10-CM

## 2018-04-25 DIAGNOSIS — J449 Chronic obstructive pulmonary disease, unspecified: Secondary | ICD-10-CM

## 2018-04-26 ENCOUNTER — Telehealth: Payer: Self-pay | Admitting: *Deleted

## 2018-04-26 DIAGNOSIS — I5042 Chronic combined systolic (congestive) and diastolic (congestive) heart failure: Secondary | ICD-10-CM

## 2018-04-26 LAB — BASIC METABOLIC PANEL
BUN/Creatinine Ratio: 10 (ref 9–23)
BUN: 11 mg/dL (ref 6–24)
CO2: 22 mmol/L (ref 20–29)
Calcium: 7.3 mg/dL — ABNORMAL LOW (ref 8.7–10.2)
Chloride: 95 mmol/L — ABNORMAL LOW (ref 96–106)
Creatinine, Ser: 1.1 mg/dL — ABNORMAL HIGH (ref 0.57–1.00)
GFR calc Af Amer: 66 mL/min/{1.73_m2} (ref >59)
GFR calc non Af Amer: 57 mL/min/{1.73_m2} — ABNORMAL LOW (ref >59)
Glucose: 100 mg/dL — ABNORMAL HIGH (ref 65–99)
Potassium: 3.4 mmol/L — ABNORMAL LOW (ref 3.5–5.2)
Sodium: 136 mmol/L (ref 134–144)

## 2018-04-26 MED ORDER — POTASSIUM CHLORIDE CRYS ER 20 MEQ PO TBCR: 20.0000 meq | EXTENDED_RELEASE_TABLET | Freq: Every day | ORAL | 3 refills | Status: DC

## 2018-04-26 NOTE — Telephone Encounter (Signed)
  PT IS AWARE OF RESULTS AND VERBALIZED UNDERSTANDING OF RESULTS.  PT STATED SHE HAS AN NOT EXPIRED  RX OF POTASSIUM 10 MEQ TABLETS AND WILL DOUBLE UP AND GO PICK UP POTASSIUM 20 MEQ DAILY ONCE SHE DONE WITH THE 10 MEQ.   PT WILL COME 3-17 FOR LAB WORK PT STATED SHE'LL BE HERE BY 4:30P  PT AND PT  ALSO STATED SHE HAVING LEG CRAMPS AND HAND CRAMPS BUT WAS RECOMMENDED BY PCP T O TAKE MAGNESIUM AND SHE DOING SO.

## 2018-04-27 ENCOUNTER — Other Ambulatory Visit: Payer: Commercial Managed Care - PPO

## 2018-04-27 DIAGNOSIS — R079 Chest pain, unspecified: Secondary | ICD-10-CM | POA: Diagnosis not present

## 2018-04-27 DIAGNOSIS — M545 Low back pain: Secondary | ICD-10-CM | POA: Diagnosis not present

## 2018-04-28 ENCOUNTER — Telehealth: Payer: Self-pay | Admitting: Cardiovascular Disease

## 2018-04-28 NOTE — Telephone Encounter (Signed)
New Message        Pharmacist is calling to see if the patient is still taking "Spironolacter?" if so they are asking if the "Potissium Rx" can be filled because the two Rx's are drug interacting drugs. Pls call to confirm

## 2018-04-28 NOTE — Telephone Encounter (Signed)
Spoke with [place], aware the patient has low potassium and is taking spironolactone and potassium together.

## 2018-04-29 ENCOUNTER — Ambulatory Visit: Payer: 59 | Admitting: Internal Medicine

## 2018-05-03 ENCOUNTER — Other Ambulatory Visit: Payer: Commercial Managed Care - PPO

## 2018-05-08 DIAGNOSIS — I5020 Unspecified systolic (congestive) heart failure: Secondary | ICD-10-CM | POA: Diagnosis not present

## 2018-05-08 DIAGNOSIS — I10 Essential (primary) hypertension: Secondary | ICD-10-CM | POA: Diagnosis not present

## 2018-05-08 DIAGNOSIS — R05 Cough: Secondary | ICD-10-CM | POA: Diagnosis not present

## 2018-05-08 DIAGNOSIS — Z0000 Encounter for general adult medical examination without abnormal findings: Secondary | ICD-10-CM | POA: Diagnosis not present

## 2018-05-09 ENCOUNTER — Other Ambulatory Visit: Payer: Self-pay

## 2018-05-09 ENCOUNTER — Telehealth: Payer: Self-pay

## 2018-05-09 DIAGNOSIS — R05 Cough: Secondary | ICD-10-CM | POA: Diagnosis not present

## 2018-05-09 DIAGNOSIS — Z0000 Encounter for general adult medical examination without abnormal findings: Secondary | ICD-10-CM | POA: Diagnosis not present

## 2018-05-09 DIAGNOSIS — I10 Essential (primary) hypertension: Secondary | ICD-10-CM | POA: Diagnosis not present

## 2018-05-09 NOTE — Telephone Encounter (Signed)
Patient called and wanted to know if she needed to continue spironolactone? If so, she will need a refill for this medication. Patient also states that she is having tingling from her elbows to her hands. There is no chest or arm pain, just tingling. She told her PCP about this yesterday and was advised to follow up with cardiology or pulmonology about this.

## 2018-05-10 ENCOUNTER — Telehealth: Payer: Self-pay | Admitting: *Deleted

## 2018-05-10 MED ORDER — SPIRONOLACTONE 25 MG PO TABS: 12.5000 mg | ORAL_TABLET | Freq: Every day | ORAL | 11 refills | Status: DC

## 2018-05-10 NOTE — Telephone Encounter (Signed)
PT AWARE TO STAY ON SPIRONOLACTONE 12.5 MG DAILY AND RX SENT INTO PHARMACY.TALK TO PT AND C/O OF A COUGH AND HAVING TINGLING IN ARMS AND LEGS BEFORE CATH IN JAN 2020. THEN WITH  MEDICATION CHANGES IT HELP AND NO PROBLEMS.  THEN HERE RECENTLY SYMPTOMS HAVE CAME  BACK AND RAISES CONCERN. PT PCP TOLD HER IT COULD POSSIBLY BE POOR CIRCULATION WHICH IS REASONING OF REFERRING TO CARDIOLOGIST.   PT HAS APPT WITH PULMONOLOGY 4-22 AND WAS GOING TO CALL TO SEE IF SHE CAN COME IN SOONER OR KEEP APPT AND DON'T GET RESCHEDULED.  PT GOT LAB DRAWN ON SUN FROM PRIMARY CARE OFFICE.  I Edwena Blow) WEAVER CMA WILL CHECK IN ON THAT

## 2018-05-10 NOTE — Telephone Encounter (Signed)
SPOKE WITH ADMINISTRATIVE TERA AT [facility]  FOR REQUEST ON LABS WORK THAT WAS DONE ON 05-08-18 FROM PT.  [facility] WILL BE FAXED AT [phone] ATTN WEAVER/SHANACMA  TERA STATED SHE WILL CALL BACK AND LEAVE A MESSAGE FOR BOTH SCOTT I ( Ramos) AS A CONFIRMATION THAT SHE SENT LABS.

## 2018-05-10 NOTE — Telephone Encounter (Addendum)
PT AWARE TO STAY ON SPIRONOLACTONE 12.5 MG DAILY AND RX SENT INTO PHARMACY.TALK TO PT AND C/O OF A COUGH AND HAVING TINGLING IN ARMS AND LEGS BEFORE CATH IN JAN 2020. THEN WITH  MEDICATION CHANGES IT HELP AND NO PROBLEMS.  THEN HERE RECENTLY SYMPTOMS HAVE CAME  BACK AND RAISES CONCERN. PT PCP TOLD HER IT COULD POSSIBLY BE POOR CIRCULATION WHICH IS REASONING OF REFERRING TO CARDIOLOGIST.   PT HAS APPT WITH PULMONOLOGY 4-22 AND WAS GOING TO CALL TO SEE IF SHE CAN COME IN SOONER OR KEEP APPT AND DON'T GET RESCHEDULED.  PT GOT LAB DRAWN ON SUN FROM PRIMARY CARE OFFICE.  I Edwena Blow) WEAVER CMA WILL CHECK IN ON THAT

## 2018-05-10 NOTE — Telephone Encounter (Signed)
She is supposed to remain on the Spironolactone. She was supposed to have had a follow up BMET this past weekend when she saw her PCP.  I need those results.   Tingling in her arms does not sound like it is related to her heart.  But, I need more information.  Please call to discuss symptoms.  Richardson Dopp, PA-C    05/10/2018 8:44 AM

## 2018-05-12 ENCOUNTER — Telehealth: Payer: Self-pay | Admitting: Physician Assistant

## 2018-05-12 ENCOUNTER — Telehealth: Payer: Self-pay | Admitting: Internal Medicine

## 2018-05-12 MED ORDER — BENZONATATE 100 MG PO CAPS: 100.0000 mg | ORAL_CAPSULE | Freq: Three times a day (TID) | ORAL | 0 refills | Status: DC | PRN

## 2018-05-12 NOTE — Telephone Encounter (Signed)
Spoke with the pt  She is requesting a refill on her tessalon pearles  This was done  Nothing further needed

## 2018-05-12 NOTE — Telephone Encounter (Signed)
Called patient and explained importance of changing appt to telehealth due to COVID-19. She does not think she can do video. She will probably need to be a Telephone visit. Link for MyChart sent to patient.   Please call patient on 05/13/2018 to obtain consent and review instructions.  Richardson Dopp, PA-C    05/12/2018 4:30 PM

## 2018-05-13 NOTE — Telephone Encounter (Signed)
I left message for the pt to confirm virtual visit 05/16/18. Looks like pt has declined MY CHART. Per Richardson Dopp, PAC who s/w pt yesterday states she can do a phone visit, see notes from PA.

## 2018-05-16 ENCOUNTER — Telehealth: Payer: Self-pay | Admitting: *Deleted

## 2018-05-16 ENCOUNTER — Telehealth (INDEPENDENT_AMBULATORY_CARE_PROVIDER_SITE_OTHER): Payer: Commercial Managed Care - PPO | Admitting: Physician Assistant

## 2018-05-16 DIAGNOSIS — I10 Essential (primary) hypertension: Secondary | ICD-10-CM | POA: Insufficient documentation

## 2018-05-16 NOTE — Progress Notes (Signed)
Patient could not be reached by phone today. Appointment was canceled. Richardson Dopp, PA-C    05/16/2018 5:05 PM

## 2018-05-16 NOTE — Telephone Encounter (Signed)
Attempted to call patient on both phone numbers listed in chart with no answer on preferred number. Home number was answered by a female who said I had the wrong number. I doubled checked both phone numbers were correctly dialed.

## 2018-05-16 NOTE — Telephone Encounter (Signed)
ATTEMPTED TO CALL PT TO DO TELEPHONE VISIT LMOVM TO CALL BACK BEFORE APPT IF POSSIBLE.  IT SAYS IN 3-26 TELEPHONE CALL PT STATED SHE COULD DO PHONE VISIT BUT NO CONSENT AND INSTRUCTIONS ENCOUNTER NOTED

## 2018-05-17 NOTE — Telephone Encounter (Signed)
Unable to reach patient by phone for visit on 05/16/2018 Richardson Dopp, PA-C    05/17/2018 10:21 AM

## 2018-05-20 DIAGNOSIS — D473 Essential (hemorrhagic) thrombocythemia: Secondary | ICD-10-CM | POA: Diagnosis not present

## 2018-05-20 DIAGNOSIS — R7989 Other specified abnormal findings of blood chemistry: Secondary | ICD-10-CM | POA: Diagnosis not present

## 2018-06-08 ENCOUNTER — Ambulatory Visit: Payer: Self-pay | Admitting: Internal Medicine

## 2018-06-13 ENCOUNTER — Other Ambulatory Visit: Payer: Commercial Managed Care - PPO

## 2018-06-14 DIAGNOSIS — R030 Elevated blood-pressure reading, without diagnosis of hypertension: Secondary | ICD-10-CM | POA: Diagnosis not present

## 2018-06-14 DIAGNOSIS — F17200 Nicotine dependence, unspecified, uncomplicated: Secondary | ICD-10-CM | POA: Diagnosis not present

## 2018-06-14 DIAGNOSIS — J069 Acute upper respiratory infection, unspecified: Secondary | ICD-10-CM | POA: Diagnosis not present

## 2018-06-28 ENCOUNTER — Other Ambulatory Visit: Payer: Commercial Managed Care - PPO

## 2018-06-28 ENCOUNTER — Other Ambulatory Visit: Payer: Self-pay

## 2018-06-28 DIAGNOSIS — I5042 Chronic combined systolic (congestive) and diastolic (congestive) heart failure: Secondary | ICD-10-CM | POA: Diagnosis not present

## 2018-06-28 LAB — COMPREHENSIVE METABOLIC PANEL
ALT: 15 IU/L (ref 0–32)
AST: 29 IU/L (ref 0–40)
Albumin/Globulin Ratio: 1.9 (ref 1.2–2.2)
Albumin: 4 g/dL (ref 3.8–4.9)
Alkaline Phosphatase: 95 IU/L (ref 39–117)
BUN/Creatinine Ratio: 18 (ref 9–23)
BUN: 14 mg/dL (ref 6–24)
Bilirubin Total: 0.2 mg/dL (ref 0.0–1.2)
CO2: 24 mmol/L (ref 20–29)
Calcium: 8.6 mg/dL — ABNORMAL LOW (ref 8.7–10.2)
Chloride: 100 mmol/L (ref 96–106)
Creatinine, Ser: 0.78 mg/dL (ref 0.57–1.00)
GFR calc Af Amer: 100 mL/min/{1.73_m2} (ref >59)
GFR calc non Af Amer: 86 mL/min/{1.73_m2} (ref >59)
Globulin, Total: 2.1 g/dL (ref 1.5–4.5)
Glucose: 89 mg/dL (ref 65–99)
Potassium: 3.8 mmol/L (ref 3.5–5.2)
Sodium: 138 mmol/L (ref 134–144)
Total Protein: 6.1 g/dL (ref 6.0–8.5)

## 2018-07-15 ENCOUNTER — Telehealth (HOSPITAL_COMMUNITY): Payer: Self-pay | Admitting: Cardiology

## 2018-07-15 NOTE — Telephone Encounter (Signed)

## 2018-07-18 ENCOUNTER — Other Ambulatory Visit: Payer: Self-pay

## 2018-07-18 ENCOUNTER — Ambulatory Visit (HOSPITAL_COMMUNITY): Payer: Commercial Managed Care - PPO | Attending: Cardiology

## 2018-07-18 DIAGNOSIS — J449 Chronic obstructive pulmonary disease, unspecified: Secondary | ICD-10-CM | POA: Diagnosis not present

## 2018-07-18 DIAGNOSIS — I10 Essential (primary) hypertension: Secondary | ICD-10-CM | POA: Insufficient documentation

## 2018-07-18 DIAGNOSIS — I5042 Chronic combined systolic (congestive) and diastolic (congestive) heart failure: Secondary | ICD-10-CM

## 2018-07-19 ENCOUNTER — Encounter: Payer: Self-pay | Admitting: Physician Assistant

## 2018-07-19 DIAGNOSIS — Q211 Atrial septal defect, unspecified: Secondary | ICD-10-CM

## 2018-07-19 HISTORY — DX: Atrial septal defect: Q21.1

## 2018-07-19 HISTORY — DX: Atrial septal defect, unspecified: Q21.10

## 2018-07-20 ENCOUNTER — Telehealth: Payer: Self-pay | Admitting: Nurse Practitioner

## 2018-07-20 NOTE — Telephone Encounter (Signed)
Reviewed results with patient who verbalized understanding. I answered questions to her satisfaction and she agrees to telephone visit follow-up with Dr. Acie Fredrickson 6/9. She states she is having paperwork sent to Richardson Dopp, PA to extend her absence from work. States she is not comfortable working while Covid 19 cases continue to increase. I advised her that he will have to make that determination but that her heart function has returned to normal. I encouraged her to talk with her manager about finding a safe way to work because Covid-19 will not be going away in the near future. Patient thanked me for the call.

## 2018-07-20 NOTE — Telephone Encounter (Signed)
-----   Message from Liliane Shi, [place] sent at 07/19/2018  4:10 PM EDT ----- Please call the patient. Her echo shows normal heart function (ejection fraction).  This is improved since her previous echocardiogram.   Recommendations:  - Continue current medications and follow up as planned.   - Please send copy to her PCP.  Richardson Dopp, PA-C    07/19/2018 4:04 PM

## 2018-07-20 NOTE — Telephone Encounter (Signed)
I think COPD is more of a risk factor for her than any cardiac problem.  Therefore, I think she should discuss her concerns about work with her pulmonologist.  I believe it would be more appropriate for her pulmonologist to make this determination. Richardson Dopp, PA-C 07/20/2018 5:18 PM

## 2018-07-26 ENCOUNTER — Telehealth (INDEPENDENT_AMBULATORY_CARE_PROVIDER_SITE_OTHER): Payer: Commercial Managed Care - PPO | Admitting: Cardiovascular Disease

## 2018-07-26 ENCOUNTER — Other Ambulatory Visit: Payer: Self-pay

## 2018-07-26 ENCOUNTER — Encounter: Payer: Self-pay | Admitting: Cardiovascular Disease

## 2018-07-26 DIAGNOSIS — I5022 Chronic systolic (congestive) heart failure: Secondary | ICD-10-CM | POA: Diagnosis not present

## 2018-07-26 NOTE — Patient Instructions (Signed)

## 2018-07-26 NOTE — Progress Notes (Signed)
Virtual Visit via Telephone Note   This visit type was conducted due to national recommendations for restrictions regarding the COVID-19 Pandemic (e.g. social distancing) in an effort to limit this patient's exposure and mitigate transmission in our community.  Due to her co-morbid illnesses, this patient is at least at moderate risk for complications without adequate follow up.  This format is felt to be most appropriate for this patient at this time.  The patient did not have access to video technology/had technical difficulties with video requiring transitioning to audio format only (telephone).  All issues noted in this document were discussed and addressed.  No physical exam could be performed with this format.  Please refer to the patient's chart for her  consent to telehealth for [facility].   Date:  07/26/2018   ID:  Mary Franco, DOB 05-Sep-1965, MRN [id number]  Patient Location: Home Provider Location: Home  PCP:  Sarajane Jews, FNP  Cardiologist:  Mertie Moores, MD  Electrophysiologist:  None   Evaluation Performed:  Follow-Up Visit  Chief Complaint:  Chronic combined CHF   July 26, 2018    Mary Franco is a 53 y.o. female with history of combined systolic and diastolic congestive heart failure.  Her ejection fraction was  40 to 45%.   EF is now normal.  Diastolic function has also improved.    She was last seen by Richardson Dopp in February and was doing quite well.  She is on bisoprolol, spironolactone, losartan.  She also has a history of essential hypertension, COPD and tobacco abuse.  When she was seen by Richardson Dopp she requested a note to be out of work out of concerns of the COVID infection.  He filled out paperwork for her earlier this year but has stated that she is stable and may return to work from a cardiac standpoint at this time.  Has a swollen lymph node .   Complains of fatigue.   Was not able to measure BP and HR  Is eating well   Saw Richardson Dopp , PA who filled out paperwork for short term disability for her.    She is requesting a letter to excuse her from work last month.   We have cleared her to return work as of June 1 ( normal EF by echo )    The patient does not have symptoms concerning for COVID-19 infection (fever, chills, cough, or new shortness of breath).    Past Medical History:  Diagnosis Date  . Alcohol abuse   . Anxiety   . ASD (atrial septal defect) 07/19/2018  . Asthma   . Cervical strain 04/16/2014  . Cervicogenic headache 04/16/2014  . Chronic combined systolic and diastolic CHF (congestive heart failure) ([place]) 03/25/2018   Echo 02/2018: EF 40-45 // Echo 07/2018:  EF 55-60, trivial AI, small secundum ASD (L>R shunting).  . COPD (chronic obstructive pulmonary disease) ([place])   . Fibromyalgia   . Migraines    Past Surgical History:  Procedure Laterality Date  . APPENDECTOMY    . KNEE SURGERY Left   . RIGHT/LEFT HEART CATH AND CORONARY ANGIOGRAPHY N/A 03/08/2018   Procedure: RIGHT/LEFT HEART CATH AND CORONARY ANGIOGRAPHY;  Surgeon: Lorretta Harp, MD;  Location: [place] [facility];  Service: Cardiovascular;  Laterality: N/A;  . TUBAL LIGATION       Current Meds  Medication Sig  . alprazolam (XANAX) 2 MG tablet Take 2 mg by mouth at bedtime as needed.  Marland Kitchen  bisoprolol (ZEBETA) 5 MG tablet Take 2.5 mg daily for 1 week and then take 5 mg daily.  . budesonide-formoterol (SYMBICORT) 80-4.5 MCG/ACT inhaler Take 2 puffs first thing in am and then another 2 puffs about 12 hours later.  . carboxymethylcellulose (REFRESH PLUS) 0.5 % SOLN Place 1 drop into both eyes daily as needed (dry eyes).  Marland Kitchen esomeprazole (NEXIUM) 20 MG capsule Take 40 mg by mouth daily at 12 noon.  Marland Kitchen ibuprofen (ADVIL,MOTRIN) 200 MG tablet Take 600 mg by mouth every 6 (six) hours as needed for headache or moderate pain.  Marland Kitchen losartan (COZAAR) 25 MG tablet Take 1 tablet (25 mg total) by mouth daily.  . Magnesium 500 MG TABS Take 1 tablet by mouth  daily.  . Melatonin 5 MG TABS Take 5 mg by mouth at bedtime as needed (sleep).  . potassium chloride SA (KLOR-CON M20) 20 MEQ tablet Take 1 tablet (20 mEq total) by mouth daily.  . promethazine (PHENERGAN) 25 MG tablet Take 1 tablet (25 mg total) by mouth every 8 (eight) hours as needed for nausea.  Marland Kitchen spironolactone (ALDACTONE) 25 MG tablet Take 0.5 tablets (12.5 mg total) by mouth daily.  . TRELEGY ELLIPTA 100-62.5-25 MCG/INH AEPB Inhale 1 puff into the lungs as needed.     Allergies:   Codeine and Sulfa antibiotics   Social History   Tobacco Use  . Smoking status: Current Every Day Smoker    Packs/day: 2.00    Years: 40.00    Pack years: 80.00    Types: Cigarettes  . Smokeless tobacco: Never Used  . Tobacco comment: as of 04/14/2018 down to 10 cigs/day  Substance Use Topics  . Alcohol use: Yes    Comment: 2 x per week  . Drug use: No     Family Hx: The patient's family history includes Cancer in her mother; Colitis in her paternal aunt; Ovarian cancer in her maternal grandmother.  ROS:   Please see the history of present illness.     All other systems reviewed and are negative.   Prior CV studies:   The following studies were reviewed today:    Labs/Other Tests and Data Reviewed:    EKG:  No ECG reviewed.  Recent Labs: 03/07/2018: B Natriuretic Peptide 1,594.7 03/09/2018: Hemoglobin 12.8; Platelets 402 06/28/2018: ALT 15; BUN 14; Creatinine, Ser 0.78; Potassium 3.8; Sodium 138   Recent Lipid Panel No results found for: CHOL, TRIG, HDL, CHOLHDL, LDLCALC, LDLDIRECT  Wt Readings from Last 3 Encounters:  07/26/18 130 lb (59 kg)  04/14/18 130 lb (59 kg)  04/13/18 129 lb (58.5 kg)     Objective:    Vital Signs:  Ht 5\' 2"  (1.575 m)   Wt 130 lb (59 kg)   BMI 23.78 kg/m      ASSESSMENT & PLAN:    1. Chronic systolic CHF;   Her EF has now normalized.  Cont current medications.  She is stable and may return to work without restrictions.  Will have her follow  up with Richardson Dopp, PA in 6 months . She needs to exercise more.   COVID-19 Education: The signs and symptoms of COVID-19 were discussed with the patient and how to seek care for testing (follow up with PCP or arrange E-visit).  The importance of social distancing was discussed today.  Time:   Today, I have spent  18  minutes with the patient with telehealth technology discussing the above problems.     Medication Adjustments/Labs and Tests Ordered:  Current medicines are reviewed at length with the patient today.  Concerns regarding medicines are outlined above.   Tests Ordered: No orders of the defined types were placed in this encounter.   Medication Changes: No orders of the defined types were placed in this encounter.   Disposition:  Follow up in 6 month(s)  Signed, Mertie Moores, MD  07/26/2018 5:34 PM    [facility]

## 2018-08-22 ENCOUNTER — Other Ambulatory Visit: Payer: Self-pay | Admitting: Internal Medicine

## 2018-08-26 ENCOUNTER — Other Ambulatory Visit (HOSPITAL_COMMUNITY): Payer: Commercial Managed Care - PPO

## 2018-08-29 ENCOUNTER — Other Ambulatory Visit (HOSPITAL_COMMUNITY)
Admission: RE | Admit: 2018-08-29 | Discharge: 2018-08-29 | Disposition: A | Discharge status: Home / Self Care | Payer: Commercial Managed Care - PPO | Source: Ambulatory Visit | Attending: Internal Medicine | Admitting: Internal Medicine

## 2018-08-29 DIAGNOSIS — Z1159 Encounter for screening for other viral diseases: Secondary | ICD-10-CM | POA: Insufficient documentation

## 2018-08-30 LAB — SARS CORONAVIRUS 2 (TAT 6-24 HRS): SARS Coronavirus 2: NEGATIVE

## 2018-08-30 NOTE — Progress Notes (Signed)
Spoke with pt and notified of results per Dr. Wert. Pt verbalized understanding and denied any questions. 

## 2018-08-31 ENCOUNTER — Encounter: Payer: Self-pay | Admitting: Internal Medicine

## 2018-08-31 ENCOUNTER — Encounter: Payer: Self-pay | Admitting: General Surgery

## 2018-08-31 ENCOUNTER — Other Ambulatory Visit: Payer: Self-pay

## 2018-08-31 ENCOUNTER — Ambulatory Visit (INDEPENDENT_AMBULATORY_CARE_PROVIDER_SITE_OTHER): Payer: Commercial Managed Care - PPO | Admitting: Internal Medicine

## 2018-08-31 ENCOUNTER — Ambulatory Visit: Payer: Commercial Managed Care - PPO | Admitting: Internal Medicine

## 2018-08-31 VITALS — BP 138/72 | HR 82 | Temp 98.1°F | Ht 63.0 in | Wt 135.0 lb

## 2018-08-31 DIAGNOSIS — J449 Chronic obstructive pulmonary disease, unspecified: Secondary | ICD-10-CM

## 2018-08-31 DIAGNOSIS — J328 Other chronic sinusitis: Secondary | ICD-10-CM

## 2018-08-31 DIAGNOSIS — F17210 Nicotine dependence, cigarettes, uncomplicated: Secondary | ICD-10-CM | POA: Diagnosis not present

## 2018-08-31 LAB — PULMONARY FUNCTION TEST
DL/VA % pred: 53 %
DL/VA: 2.3 ml/min/mmHg/L
DLCO unc % pred: 60 %
DLCO unc: 12.07 ml/min/mmHg
FEF 25-75 Post: 0.99 L/sec
FEF 25-75 Pre: 0.72 L/sec
FEF2575-%Change-Post: 38 %
FEF2575-%Pred-Post: 39 %
FEF2575-%Pred-Pre: 28 %
FEV1-%Change-Post: 11 %
FEV1-%Pred-Post: 72 %
FEV1-%Pred-Pre: 64 %
FEV1-Post: 1.89 L
FEV1-Pre: 1.69 L
FEV1FVC-%Change-Post: 6 %
FEV1FVC-%Pred-Pre: 68 %
FEV6-%Change-Post: 5 %
FEV6-%Pred-Post: 98 %
FEV6-%Pred-Pre: 92 %
FEV6-Post: 3.18 L
FEV6-Pre: 3 L
FEV6FVC-%Change-Post: 1 %
FEV6FVC-%Pred-Post: 100 %
FEV6FVC-%Pred-Pre: 99 %
FVC-%Change-Post: 4 %
FVC-%Pred-Post: 97 %
FVC-%Pred-Pre: 93 %
FVC-Post: 3.26 L
FVC-Pre: 3.11 L
Post FEV1/FVC ratio: 58 %
Post FEV6/FVC ratio: 98 %
Pre FEV1/FVC ratio: 54 %
Pre FEV6/FVC Ratio: 96 %
RV % pred: 163 %
RV: 2.97 L
TLC % pred: 125 %
TLC: 6.15 L

## 2018-08-31 MED ORDER — AMOXICILLIN-POT CLAVULANATE 875-125 MG PO TABS: 1.0000 | ORAL_TABLET | Freq: Two times a day (BID) | ORAL | 0 refills | Status: AC

## 2018-08-31 MED ORDER — TRELEGY ELLIPTA 100-62.5-25 MCG/INH IN AEPB: 1.0000 | INHALATION_SPRAY | Freq: Every day | RESPIRATORY_TRACT | 11 refills | Status: DC

## 2018-08-31 MED ORDER — TRELEGY ELLIPTA 100-62.5-25 MCG/INH IN AEPB: 1.0000 | INHALATION_SPRAY | Freq: Every day | RESPIRATORY_TRACT | 0 refills | Status: DC

## 2018-08-31 MED ORDER — PROAIR RESPICLICK 108 (90 BASE) MCG/ACT IN AEPB: 1.0000 | INHALATION_SPRAY | RESPIRATORY_TRACT | 11 refills | Status: DC | PRN

## 2018-08-31 NOTE — Progress Notes (Signed)
PFT done today. 

## 2018-08-31 NOTE — Progress Notes (Signed)
Mary Franco, female    DOB: 1966-01-19,     MRN: [id number]   Brief patient profile:  67 yowf active smoker with healthy childhood and last IUP  Age 53 with tendency to sinus infections/ bronchitis starting around 2005 couple times a year occ needed neb that she borrowed from relative but worse since winter  2019 dx with by Nahser with chf some better then in January 19th to [place] then to [facility] then home and says never walked in hallway and maybe 50-60% better but could not get to [facility] / nor shop at [employer] and they started smoking and no nebulizer broke    R/L [facility] 03/08/18 EF 40 LVEDP 20 with CO 6lpm   PA mean 25/wedge 15 so PVR = 10/6 = wnl   History of Present Illness  03/30/2018  Pulmonary/ 1st office eval/Miroslav Gin  Chief Complaint  Patient presents with  . Consult    Referred by Dr. Hassell Done due to wheezing, coughing, chest tightness and congestion. Pt states when she coughs, she is unable to get any mucus to come up.  Dyspnea:  MMRC3 = can't walk 100 yards even at a slow pace at a flat grade s stopping due to sob   Cough: nothing much now, min mucoid in ams Sleep: maybe 30 degrees due to HB SABA use: 4 x daily, rarely bedtime rec Plan A = Automatic = Symbicort 80 Take 2 puffs first thing in am and then another 2 puffs about 12 hours later.  Work on inhaler technique:   Plan B = Backup Only use your albuterol inhaler    08/31/2018  f/u ov/Lilliana Turner re: GOLD II copd / still smoking  Chief Complaint  Patient presents with  . Results    PFT - COPD mixed type  Dyspnea:  MMRC2 = can't walk a nl pace on a flat grade s sob but does fine slow and flat  Cough: some daytime mucoid Sleeping: better but still says needs to be at 30 degrees to breath comfortably SABA use: none  02: none    No obvious day to day or daytime variability or assoc  purulent sputum or mucus plugs or hemoptysis or cp or chest tightness, subjective wheeze or overt sinus or hb symptoms.   Sleeping as above without  nocturnal  or early am exacerbation  of respiratory  c/o's or need for noct saba. Also denies any obvious fluctuation of symptoms with weather or environmental changes or other aggravating or alleviating factors except as outlined above   No unusual exposure hx or h/o childhood pna/ asthma or knowledge of premature birth.  Current Allergies, Complete Past Medical History, Past Surgical History, Family History, and Social History were reviewed in [employer] record.  ROS  The following are not active complaints unless bolded Hoarseness, sore throat, dysphagia, dental problems, itching, sneezing,  nasal congestion or discharge of excess mucus or purulent secretions, ear ache,   fever, chills, sweats, unintended wt loss or wt gain, classically pleuritic or exertional cp,  orthopnea pnd or arm/hand swelling  or leg swelling, presyncope, palpitations, abdominal pain, anorexia, nausea, vomiting, diarrhea  or change in bowel habits or change in bladder habits, change in stools or change in urine, dysuria, hematuria,  rash, arthralgias, visual complaints, headache, numbness, weakness or ataxia or problems with walking or coordination,  change in mood or  memory.        Current Meds  Medication Sig  . alprazolam (XANAX) 2 MG  tablet Take 2 mg by mouth at bedtime as needed.  . bisoprolol (ZEBETA) 5 MG tablet Take 2.5 mg daily for 1 week and then take 5 mg daily.  .     . esomeprazole (NEXIUM) 20 MG capsule Take 40 mg by mouth daily at 12 noon.  Marland Kitchen ibuprofen (ADVIL,MOTRIN) 200 MG tablet Take 600 mg by mouth every 6 (six) hours as needed for headache or moderate pain.  Marland Kitchen losartan (COZAAR) 25 MG tablet Take 1 tablet (25 mg total) by mouth daily.  . Magnesium 500 MG TABS Take 1 tablet by mouth daily.  . Melatonin 3 MG TABS Take 3 mg by mouth at bedtime as needed (sleep).   . potassium chloride SA (KLOR-CON M20) 20 MEQ tablet Take 1 tablet (20 mEq total) by mouth daily.  . promethazine  (PHENERGAN) 25 MG tablet Take 1 tablet (25 mg total) by mouth every 8 (eight) hours as needed for nausea. (Patient taking differently: Take 25 mg by mouth every 8 (eight) hours as needed for nausea. 1/2 tablet as needed)  . spironolactone (ALDACTONE) 25 MG tablet Take 0.5 tablets (12.5 mg total) by mouth daily.  . TRELEGY ELLIPTA 100-62.5-25 MCG/INH AEPB Inhale 1 puff into the lungs as needed.             Objective:     amb wf nad /mildy hoarse, some rattling on cough maneuver  Wt Readings from Last 3 Encounters:  08/31/18 135 lb (61.2 kg)  07/26/18 130 lb (59 kg)  04/14/18 130 lb (59 kg)     Vital signs reviewed - Note on arrival 02 sats  99% on RA    . HEENT: nl dentition / oropharynx. Nl external ear canals without cough reflex -  Mild bilateral non-specific turbinate edema     NECK :  without JVD/Nodes/TM/ nl carotid upstrokes bilaterally   LUNGS: no acc muscle use,  Min barrel  contour chest wall with bilateral  slightly decreased bs s audible wheeze and  without cough on insp or exp maneuver and min  Hyperresonant  to  percussion bilaterally     CV:  RRR  no s3 or murmur or increase in P2, and no edema   ABD:  soft and nontender with pos end  insp Hoover's  in the supine position. No bruits or organomegaly appreciated, bowel sounds nl  MS:   Nl gait/  ext warm without deformities, calf tenderness, cyanosis or clubbing No obvious joint restrictions   SKIN: warm and dry without lesions    NEURO:  alert, approp, nl sensorium with  no motor or cerebellar deficits apparent.          I personally reviewed images and agree with radiology impression as follows:  CXR:   04/19/2018  No acute disease. Stable changes of COPD and chronic bronchitis         Assessment

## 2018-08-31 NOTE — Patient Instructions (Addendum)
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    The key is to stop smoking completely before smoking completely stops you!  Plan A = Automatic = Trelegy one click each am   Plan B = Backup Only use your albuterol inhaler(proair respiclick)  as a rescue medication to be used if you can't catch your breath by resting or doing a relaxed purse lip breathing pattern.  - The less you use it, the better it will work when you need it. - Ok to use the inhaler up to 2 puffs  every 4 hours if you must but call for appointment if use goes up over your usual need - Don't leave home without it !!  (think of it like the spare tire for your car)    .Please schedule a follow up visit in 6 months but call sooner if needed

## 2018-09-01 ENCOUNTER — Telehealth: Payer: Self-pay | Admitting: Internal Medicine

## 2018-09-01 ENCOUNTER — Encounter: Payer: Self-pay | Admitting: Internal Medicine

## 2018-09-01 DIAGNOSIS — J329 Chronic sinusitis, unspecified: Secondary | ICD-10-CM

## 2018-09-01 HISTORY — DX: Chronic sinusitis, unspecified: J32.9

## 2018-09-01 NOTE — Telephone Encounter (Signed)
Yes that's fine, that's why I put it in her instructions

## 2018-09-01 NOTE — Assessment & Plan Note (Signed)
4-5 min discussion re active cigarette smoking in addition to office E&M  Ask about tobacco use:   ongoing Advise quitting   I took an extended  opportunity with this patient to outline the consequences of continued cigarette use  in airway disorders based on all the data we have from the multiple national lung health studies (perfomed over decades at millions of dollars in cost)  indicating that smoking cessation, not choice of inhalers or physicians, is the most important aspect of her care.   Assess willingness:  Not committed at this point Assist in quit attempt:  Per PCP when ready Arrange follow up:   Follow up per Primary Care planned  For smoking cessation classes call [phone]

## 2018-09-01 NOTE — Assessment & Plan Note (Addendum)
Onset 2005   Probably related to smoking rather than allergic mech based on hx   rec :  Augmentin 875 mg take one pill twice daily  X 10 days - take at breakfast and supper with large glass of water.  It would help reduce the usual side effects (diarrhea and yeast infections) if you ate cultured yogurt at lunch. And f/u PCP or ENT if doesn't clear   I had an extended discussion with the patient reviewing all relevant studies completed to date and  lasting 15 to 20 minutes of a 25 minute visit    See device teaching which extended face to face time for this visit.  Each maintenance medication was reviewed in detail including emphasizing most importantly the difference between maintenance and prns and under what circumstances the prns are to be triggered using an action plan format that is not reflected in the computer generated alphabetically organized AVS which I have not found useful in most complex patients, especially with respiratory illnesses  Please see AVS for specific instructions unique to this visit that I personally wrote and verbalized to the the pt in detail and then reviewed with pt  by my nurse highlighting any  changes in therapy recommended at today's visit to their plan of care.

## 2018-09-01 NOTE — Telephone Encounter (Signed)
Call returned to patient, she is requesting a work note. She states she was seen yesterday and was given an antibiotic. She states she does feel like she needs the work note. She reports the letter was offered but she declined but has changed her mind. Per AVS okay for letter.   MW is this okay for letter:  To whom it may concern  This patient is seen in our office on 08/31/2018 for management of her COPD. In my medical opinion she should not return to work until Monday July 20th. 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   MW please advise if this is okay for the letter. Thanks.

## 2018-09-01 NOTE — Telephone Encounter (Signed)
Letter typed, printed, and signed.  Pt aware that it is up front ready for pickup.   Nothing further needed at this time- will close encounter.

## 2018-09-01 NOTE — Assessment & Plan Note (Addendum)
Active smoker - 03/30/2018   Walked RA  3  laps @  approx 259ft each @ avg pace  stopped due to end of study, mild sob and chest tight, no desats   - Spirometry 03/30/2018  FEV1 2.4  (94%)  Ratio 0.73 s physiologic curvature p > 4 h since last saba  - 03/30/2018    try trelegy sample to see if reduces need for saba   - 04/14/2018  After extensive coaching inhaler device,  effectiveness =    75% from basline 50% > try symb 80 2bid  - 08/31/2018  After extensive coaching inhaler device,  effectiveness =    90% with dpi  -  PFT's  08/31/2018  FEV1 1.89 (72 % ) ratio 0.58  p 11 % improvement from saba p nothing prior to study with DLCO  60 % corrects to 53  % for alv volume    Group D in terms of symptom/risk and laba/lama/ICS  therefore appropriate rx at this point >>>  trelegy approp maint rx with dpi saba as her "plan B" so all of her routine inhalers are similar technique    Improved clinically while on Trelegy with much less need for saba now so main issue is access to rx and work on smoking/ control of sinus dz (see separate a/p)

## 2018-09-06 ENCOUNTER — Telehealth: Payer: Self-pay | Admitting: Internal Medicine

## 2018-09-06 NOTE — Telephone Encounter (Signed)
Returned call to patient regarding work note.  Patient has work note from Dr. Melvyn Novas after recent visit.  States her work does not acknowledge his recommendations and she is unable to work in areas where there is no circulating air.  She has pictures of her work environment and is requesting an appointment with Dr. Melvyn Novas to discuss her inability to function in the heat on her job.  She states she had to leave work today due to she was unable to work under those conditions.  She states she has talked with her employer/supervisor but she doesn't feel they will accommodate her needs and wants to discuss with Dr. Melvyn Novas.  Appointment made for 09/07/18 at 9:30am.  Nothing further needed.

## 2018-09-07 ENCOUNTER — Ambulatory Visit: Payer: Commercial Managed Care - PPO | Admitting: Internal Medicine

## 2018-09-07 ENCOUNTER — Other Ambulatory Visit: Payer: Self-pay

## 2018-09-07 ENCOUNTER — Encounter: Payer: Self-pay | Admitting: *Deleted

## 2018-09-07 ENCOUNTER — Encounter: Payer: Self-pay | Admitting: Internal Medicine

## 2018-09-07 DIAGNOSIS — J328 Other chronic sinusitis: Secondary | ICD-10-CM | POA: Diagnosis not present

## 2018-09-07 DIAGNOSIS — F17210 Nicotine dependence, cigarettes, uncomplicated: Secondary | ICD-10-CM

## 2018-09-07 DIAGNOSIS — J449 Chronic obstructive pulmonary disease, unspecified: Secondary | ICD-10-CM

## 2018-09-07 NOTE — Assessment & Plan Note (Signed)
Counseled re importance of smoking cessation but did not meet time criteria for separate billing     I had an extended discussion with the patient reviewing all relevant studies completed to date and  lasting 15 to 20 minutes of a 25 minute visit    Each maintenance medication was reviewed in detail including most importantly the difference between maintenance and prns and under what circumstances the prns are to be triggered using an action plan format that is not reflected in the computer generated alphabetically organized AVS.     Please see AVS for specific instructions unique to this visit that I personally wrote and verbalized to the the pt in detail and then reviewed with pt  by my nurse highlighting any  changes in therapy recommended at today's visit to their plan of care.   

## 2018-09-07 NOTE — Assessment & Plan Note (Signed)
Onset 2005  - augmentin x 10 days 08/31/18     Sinus ct or ent eval if not improved

## 2018-09-07 NOTE — Assessment & Plan Note (Signed)
Active smoker - 03/30/2018   Walked RA  3  laps @  approx 229ft each @ avg pace  stopped due to end of study, mild sob and chest tight, no desats   - Spirometry 03/30/2018  FEV1 2.4  (94%)  Ratio 0.73 s physiologic curvature p > 4 h since last saba  - 03/30/2018    try trelegy sample to see if reduces need for saba   - 04/14/2018  After extensive coaching inhaler device,  effectiveness =    75% from basline 50% > try symb 80 2bid  - 08/31/2018  After extensive coaching inhaler device,  effectiveness =    90% with dpi  -  PFT's  08/31/2018  FEV1 1.89 (72 % ) ratio 0.58  p 11 % improvement from saba p nothing prior to study with DLCO  60 % corrects to 53  % for alv volume   Doing much better when in [facility] and clearly can't be expected to work s it given present heat wave.  Letter written to employer, f/u q 3 months approp

## 2018-09-07 NOTE — Patient Instructions (Addendum)
Augmentin 875 mg take one pill twice daily  X 10 days - take at breakfast and supper with large glass of water.  It would help reduce the usual side effects (diarrhea and yeast infections) if you ate cultured yogurt at lunch.   If sinuses not better call for ENT eval.  Cannot be expected to work in an un-airconditioned environment due to  COPD with an asthmatic component.   The key is to stop smoking completely before smoking completely stops you!   Please schedule a follow up visit in 3 months but call sooner if needed

## 2018-09-07 NOTE — Progress Notes (Signed)
Mary Franco, female    DOB: 1965/08/09,     MRN: [id number]   Brief patient profile:  22 yowf active smoker with healthy childhood and last IUP  Age 53 with tendency to sinus infections/ bronchitis starting around 2005 couple times a year occ needed neb that she borrowed from relative but worse since winter  2019 dx with by Mary Franco with chf some better then in January 19th to [place] then to [facility] then home and says never walked in hallway and maybe 50-60% better but could not get to [place] / nor shop at [employer] and they started smoking and no nebulizer broke    R/L [facility] 03/08/18 EF 40 LVEDP 20 with CO 6lpm   PA mean 25/wedge 15 so PVR = 10/6 = wnl   History of Present Illness  03/30/2018  Pulmonary/ 1st office eval/Mary Franco  Chief Complaint  Patient presents with  . Consult    Referred by Dr. Hassell Done due to wheezing, coughing, chest tightness and congestion. Pt states when she coughs, she is unable to get any mucus to come up.  Dyspnea:  MMRC3 = can't walk 100 yards even at a slow pace at a flat grade s stopping due to sob   Cough: nothing much now, min mucoid in ams Sleep: maybe 30 degrees due to HB SABA use: 4 x daily, rarely bedtime rec Plan A = Automatic = Symbicort 80 Take 2 puffs first thing in am and then another 2 puffs about 12 hours later.  Work on inhaler technique:   Plan B = Backup Only use your albuterol inhaler    08/31/2018  f/u ov/Mary Franco re: GOLD II copd / still smoking  Chief Complaint  Patient presents with  . 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  The key is to stop smoking completely  before smoking completely stops you! Plan A = Automatic = Trelegy one click each am  Plan B = Backup Only use your albuterol inhaler(proair respiclick)  as a rescue medication  augmentin 875 bid x 10 days for nasal congestion "    09/05/18 tried to go back to work sent home p 1.5 h no explanation per pt  09/06/18 and stayed 5.5 h in no A/C and then had cc couldn't breathe and inhaler didn't work and informed supervisor and not offered a fan and texted him to report she had to leave.     09/07/2018  f/u ov/Mary Franco re:  GOLD II copd/ still smoking / maint trelegy  Chief Complaint  Patient presents with  . 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   Dyspnea:  Still MMRC =2 but cannot tol heat > severe sweating, coughing, sob, much better inac  Cough: mostly dry  Sleeping: flat  one pillow/ ok p xanax  SABA use: only at work  02: none    No obvious day to day or daytime variability or  assoc excess/ purulent sputum or mucus plugs or hemoptysis or cp or chest tightness, subjective wheeze or overt sinus or hb symptoms.   Sleeping as above  without nocturnal  or early am exacerbation  of respiratory  c/o's or need for noct saba. Also denies any obvious fluctuation of symptoms with weather or environmental changes or other aggravating or alleviating factors except as outlined above   No unusual exposure hx or h/o childhood pna/ asthma or knowledge of premature birth.  Current Allergies, Complete Past Medical History, Past Surgical History, Family History, and Social History were reviewed in [employer] record.  ROS  The following are not active complaints unless bolded Hoarseness, sore throat, dysphagia, dental problems, itching, sneezing,  nasal congestion or discharge of excess mucus or purulent secretions, ear ache,   fever, chills, sweats, unintended wt loss or wt  gain, classically pleuritic or exertional cp,  orthopnea pnd or arm/hand swelling  or leg swelling, presyncope, palpitations, abdominal pain, anorexia, nausea, vomiting, diarrhea  or change in bowel habits or change in bladder habits, change in stools or change in urine, dysuria, hematuria,  rash, arthralgias, visual complaints, headache, numbness, weakness or ataxia or problems with walking or coordination,  change in mood or  memory.        Current Meds  Medication Sig  . Albuterol Sulfate (PROAIR RESPICLICK) 262 (90 Base) MCG/ACT AEPB Inhale 1-2 puffs into the lungs every 4 (four) hours as needed.  Marland Kitchen alprazolam (XANAX) 2 MG tablet Take 2 mg by mouth at bedtime as needed.  Marland Kitchen amoxicillin-clavulanate (AUGMENTIN) 875-125 MG tablet Take 1 tablet by mouth 2 (two) times daily for 10 days.  . bisoprolol (ZEBETA) 5 MG tablet Take 2.5 mg daily for 1 week and then take 5 mg daily.  . carboxymethylcellulose (REFRESH PLUS) 0.5 % SOLN Place 1 drop into both eyes daily as needed (dry eyes).  Marland Kitchen esomeprazole (NEXIUM) 20 MG capsule Take 40 mg by mouth daily at 12 noon.  . Fluticasone-Umeclidin-Vilant (TRELEGY ELLIPTA) 100-62.5-25 MCG/INH AEPB Inhale 1 puff into the lungs daily.  Marland Kitchen ibuprofen (ADVIL,MOTRIN) 200 MG tablet Take 600 mg by mouth every 6 (six) hours as needed for headache or moderate pain.  Marland Kitchen losartan (COZAAR) 25 MG tablet Take 1 tablet (25 mg total) by mouth daily.  . Magnesium 500 MG TABS Take 1 tablet by mouth daily.  . Melatonin 3 MG TABS Take 3 mg by mouth at bedtime as needed (sleep).   . potassium chloride SA (KLOR-CON M20) 20 MEQ tablet Take 1 tablet (20 mEq total) by mouth daily.  . promethazine (PHENERGAN) 25 MG tablet Take 1 tablet (25 mg total) by mouth every 8 (eight) hours as needed for nausea. (Patient taking differently: Take 25 mg by mouth every 8 (eight) hours as needed for nausea. 1/2 tablet as needed)  . spironolactone (ALDACTONE) 25 MG tablet Take 0.5 tablets (12.5 mg total) by  mouth daily.        Objective:     amb wf, still some rattling on fvc maneuver, voluntary cough   09/07/2018        137   08/31/18 135 lb (61.2 kg)  07/26/18 130 lb (59 kg)  04/14/18 130 lb (59 kg)    Vital signs reviewed - Note on arrival 02 sats  99% on RA     . HEENT: nl dentition / oropharynx. Nl external ear canals without cough reflex -  Mild bilateral non-specific turbinate edema     NECK :  without JVD/Nodes/TM/ nl carotid upstrokes bilaterally   LUNGS: no acc muscle use,  Min barrel  contour chest wall with bilateral  slightly decreased bs s audible wheeze and  without cough on insp or exp maneuver and min  Hyperresonant  to  percussion bilaterally     CV:  RRR  no s3 or murmur or increase in P2, and no edema   ABD:  soft and nontender with pos end  insp Hoover's  in the supine position. No bruits or organomegaly appreciated, bowel sounds nl  MS:   Nl gait/  ext warm without deformities, calf tenderness, cyanosis or clubbing No obvious joint restrictions   SKIN: warm and dry without lesions    NEURO:  alert, approp, nl sensorium with  no motor or cerebellar deficits apparent.                       Assessment

## 2018-09-12 DIAGNOSIS — R87612 Low grade squamous intraepithelial lesion on cytologic smear of cervix (LGSIL): Secondary | ICD-10-CM

## 2018-09-12 HISTORY — DX: Low grade squamous intraepithelial lesion on cytologic smear of cervix (LGSIL): R87.612

## 2018-10-26 ENCOUNTER — Encounter: Payer: Self-pay | Admitting: Obstetrics and Gynecology

## 2018-11-01 ENCOUNTER — Telehealth: Payer: Self-pay | Admitting: Internal Medicine

## 2018-11-01 NOTE — Telephone Encounter (Signed)
Left message for patient to call back  

## 2018-11-02 NOTE — Telephone Encounter (Signed)
Spoke with patient. She has some paperwork for Dr. Melvyn Novas to sign so she can get this loan since she is unable to work and hasn't since April.   I told patient she needs to drop off paperwork and once Dr. Melvyn Novas signed if able we would call her to pick them back up.   Patient voiced understanding, will leave open until paperwork has been signed and returned to patient.

## 2018-11-08 NOTE — Telephone Encounter (Signed)
ATC pt, line went to voicemail. I have left a detailed message (per pt DPR) letting her know we checked with Dr. Gustavus Bryant nurse and that we do not have any paperwork regarding her back to work request.

## 2018-11-08 NOTE — Telephone Encounter (Signed)
I have not received any paperwork on this pt

## 2018-11-08 NOTE — Telephone Encounter (Signed)
Magda Paganini can you check to see if these papers have been filled out?

## 2018-11-18 ENCOUNTER — Ambulatory Visit: Payer: Commercial Managed Care - PPO | Admitting: Physician Assistant

## 2018-11-18 NOTE — Progress Notes (Deleted)
Cardiology Office Note:    Date:  11/18/2018   ID:  Mary Franco, DOB 1965/08/16, MRN [id number]  PCP:  Mary Jews, FNP  Cardiologist:  Mary Moores, MD *** Electrophysiologist:  None   Referring MD: Mary Jews, FNP   No chief complaint on file. ***  History of Present Illness:    Mary Franco is a 53 y.o. female with:   Heart failure with preserved ejection fraction w/ return of normal LVF  Nonischemic cardiomyopathy  Admx 02/2018: EF 40-45  Cath: normal coronary arteries   Hypertension  Tobacco use  Alcohol abuse  GERD  COPD  Mary Franco was last seen by Dr. Acie Franco via telemedicine in June 2020.  The DICTATELATER SmartLink is not supported in this context. ***  Prior CV studies:   The following studies were reviewed today:  Echocardiogram 07/18/2018 EF 123456, normal diastolic function, normal wall motion, normal RV SF, small secundum ASD (L-R shunt)  R/LCardiac Catheterization1/21/20 Normal coronary arteries Elevated LV EDP consistent with volume overload. Right atrial pressure-5/3 Right ventricular pressure-34/2 Pulmonary artery pressure-35/17, mean 25 Pulmonary wedge pressure-A-wave 19, V wave 17, mean 15 LVEDP-20 Cardiac output (Fick)-6 L/min  Echocardiogram 02/2018 [facility]) EF 40-45, mild LAE,? PFO/small ASD, mild MR, moderate TR, RVSP 51 (moderate pulmonary hypertension)  Past Medical History:  Diagnosis Date  . Alcohol abuse   . Anxiety   . ASD (atrial septal defect) 07/19/2018  . Asthma   . Cervical strain 04/16/2014  . Cervicogenic headache 04/16/2014  . Chronic combined systolic and diastolic CHF (congestive heart failure) ([place]) 03/25/2018   Echo 02/2018: EF 40-45 // Echo 07/2018:  EF 55-60, trivial AI, small secundum ASD (L>R shunting).  . COPD (chronic obstructive pulmonary disease) ([place])   . Fibromyalgia   . Migraines    Surgical Hx: The patient  has a past surgical history that includes Tubal ligation;  Appendectomy; Knee surgery (Left); and RIGHT/LEFT HEART CATH AND CORONARY ANGIOGRAPHY (N/A, 03/08/2018).   Current Medications: No outpatient medications have been marked as taking for the 11/18/18 encounter (Appointment) with Mary Franco T, PA-C.     Allergies:   Codeine and Sulfa antibiotics   Social History   Tobacco Use  . Smoking status: Current Every Day Smoker    Packs/day: 2.00    Years: 40.00    Pack years: 80.00    Types: Cigarettes  . Smokeless tobacco: Never Used  . Tobacco comment: as of 04/14/2018 down to 10 cigs/day  Substance Use Topics  . Alcohol use: Yes    Comment: 2 x per week  . Drug use: No     Family Hx: The patient's family history includes Cancer in her mother; Colitis in her paternal aunt; Ovarian cancer in her maternal grandmother.  ROS:   Please see the history of present illness.    ROS All other systems reviewed and are negative.   EKGs/Labs/Other Test Reviewed:    EKG:  EKG is *** ordered today.  The ekg ordered today demonstrates ***  Recent Labs: 03/07/2018: B Natriuretic Peptide 1,594.7 03/09/2018: Hemoglobin 12.8; Platelets 402 06/28/2018: ALT 15; BUN 14; Creatinine, Ser 0.78; Potassium 3.8; Sodium 138   Recent Lipid Panel No results found for: CHOL, TRIG, HDL, CHOLHDL, LDLCALC, LDLDIRECT  Physical Exam:    VS:  There were no vitals taken for this visit.    Wt Readings from Last 3 Encounters:  09/07/18 137 lb 12.8 oz (62.5 kg)  08/31/18 135 lb (61.2 kg)  07/26/18 130 lb (59  kg)     ***Physical Exam  ASSESSMENT & PLAN:    ***  Chronic combined systolic and diastolic CHF (congestive heart failure) ([facility]) EF 40-45.  NYHA 2. Volume status appears stable.  Her BP can tolerate further titration of medications for CHF.  From a CHF standpoint, she can return to work now.  If she needs to remain out of work for anxiety or COPD, she will have to FU with her PCP or pulmonologist.             -Continue current dose of Spironolactone,  Bisoprolol               -Start Losartan 25 mg QD.               -BMET 2 weeks.              -FU with me 4-6 weeks to adjust CHF meds             -FU with Dr. Acie Franco in 4 mos with limited echo prior to appt to recheck EF             -Short term disability paperwork was turned in today and completed by me.              Essential hypertension BP above goal. Add Losartan as noted.  Tobacco abuse She is trying to quit.   COPD GOLD 0/ still smoking FU with Dr. Melvyn Novas as planned.  Dispo:  No follow-ups on file.   Medication Adjustments/Labs and Tests Ordered: Current medicines are reviewed at length with the patient today.  Concerns regarding medicines are outlined above.  Tests Ordered: No orders of the defined types were placed in this encounter.  Medication Changes: No orders of the defined types were placed in this encounter.   Signed, Mary Dopp, PA-C  11/18/2018 8:10 AM    [facility] [place], [place], [place]  [address] Phone: [phone]; Fax: [phone]

## 2018-11-18 NOTE — Progress Notes (Signed)
Cardiology Office Note    Date:  11/21/2018   ID:  Mary Franco, DOB [birth year], MRN [id number]  PCP:  Mary Jews, FNP  Cardiologist:  Dr. Acie Fredrickson  Chief Complaint: 3 Months follow up  History of Present Illness:   Mary Franco is a 53 y.o. female with hx of chronic combined CHF 2nd to NICM, HTN, COPD with ongoing tobacco smoking, asthma and alcohol abuse seen for follow up.   Cath 02/2018 showed normal coronaries. LVEDP was mildly elevated although her wedge is fairly normal and she has a excellent cardiac output.  Medical therapy will be recommended including lifestyle modification, smoking cessation and avoidance of salt.   Last seen by Dr.Nasher 07/2018 virtually. Felt stable to return to work without restriction.   Here today for follow up.  Currently smokes half a pack a day.  Social drinking.  She denies chest pain, orthopnea, PND, syncope, lower extremity edema or melena.  Compliant with her medications.  Occasional shortness of breath and cough secondary to tobacco smoking.  Past Medical History:  Diagnosis Date  . Alcohol abuse   . Anxiety   . ASD (atrial septal defect) 07/19/2018  . Asthma   . Cervical strain 04/16/2014  . Cervicogenic headache 04/16/2014  . Chronic combined systolic and diastolic CHF (congestive heart failure) ([place]) 03/25/2018   Echo 02/2018: EF 40-45 // Echo 07/2018:  EF 55-60, trivial AI, small secundum ASD (L>R shunting).  . COPD (chronic obstructive pulmonary disease) ([place])   . Fibromyalgia   . Migraines     Past Surgical History:  Procedure Laterality Date  . APPENDECTOMY    . KNEE SURGERY Left   . RIGHT/LEFT HEART CATH AND CORONARY ANGIOGRAPHY N/A 03/08/2018   Procedure: RIGHT/LEFT HEART CATH AND CORONARY ANGIOGRAPHY;  Surgeon: Mary Harp, MD;  Location: [facility];  Service: Cardiovascular;  Laterality: N/A;  . TUBAL LIGATION      Current Medications: Prior to Admission medications   Medication Sig Start Date End  Date Taking? Authorizing Provider  Albuterol Sulfate (PROAIR RESPICLICK) [id number] (90 Base) MCG/ACT AEPB Inhale 1-2 puffs into the lungs every 4 (four) hours as needed. 08/31/18   Tanda Rockers, MD  alprazolam Mary Franco) 2 MG tablet Take 2 mg by mouth at bedtime as needed.    [provider]  bisoprolol (ZEBETA) 5 MG tablet Take 2.5 mg daily for 1 week and then take 5 mg daily. 03/25/18   Mary Franco T, PA-C  carboxymethylcellulose (REFRESH PLUS) 0.5 % SOLN Place 1 drop into both eyes daily as needed (dry eyes).    [provider]  esomeprazole (NEXIUM) 20 MG capsule Take 40 mg by mouth daily at 12 noon.    [provider]  Fluticasone-Umeclidin-Vilant (TRELEGY ELLIPTA) 100-62.5-25 MCG/INH AEPB Inhale 1 puff into the lungs daily. 08/31/18   Tanda Rockers, MD  ibuprofen (ADVIL,MOTRIN) 200 MG tablet Take 600 mg by mouth every 6 (six) hours as needed for headache or moderate pain.    [provider]  losartan (COZAAR) 25 MG tablet Take 1 tablet (25 mg total) by mouth daily. 04/13/18   Mary Franco T, PA-C  Magnesium 500 MG TABS Take 1 tablet by mouth daily.    [provider]  Melatonin 3 MG TABS Take 3 mg by mouth at bedtime as needed (sleep).     [provider]  potassium chloride SA (KLOR-CON M20) 20 MEQ tablet Take 1 tablet (20 mEq total) by mouth daily. 04/26/18  Mary Franco, Mary T, PA-C  promethazine (PHENERGAN) 25 MG tablet Take 1 tablet (25 mg total) by mouth every 8 (eight) hours as needed for nausea. Patient taking differently: Take 25 mg by mouth every 8 (eight) hours as needed for nausea. 1/2 tablet as needed 02/12/12   Mary Banister, MD  spironolactone (ALDACTONE) 25 MG tablet Take 0.5 tablets (12.5 mg total) by mouth daily. 05/10/18   Mary Franco T, PA-C    Allergies:   Codeine and Sulfa antibiotics   Social History   Socioeconomic History  . Marital status: Divorced    Spouse name: Not on file  . Number of children: 2  . Years of  education: Not on file  . Highest education level: Not on file  Occupational History  . Occupation: Copy  . Financial resource strain: Not on file  . Food insecurity    Worry: Not on file    Inability: Not on file  . Transportation needs    Medical: Not on file    Non-medical: Not on file  Tobacco Use  . Smoking status: Current Every Day Smoker    Packs/day: 2.00    Years: 40.00    Pack years: 80.00    Types: Cigarettes  . Smokeless tobacco: Never Used  . Tobacco comment: as of 04/14/2018 down to 10 cigs/day  Substance and Sexual Activity  . Alcohol use: Yes    Comment: 2 x per week  . Drug use: No  . Sexual activity: Not on file  Lifestyle  . Physical activity    Days per week: Not on file    Minutes per session: Not on file  . Stress: Not on file  Relationships  . Social Herbalist on phone: Not on file    Gets together: Not on file    Attends religious service: Not on file    Active member of club or organization: Not on file    Attends meetings of clubs or organizations: Not on file    Relationship status: Not on file  Other Topics Concern  . Not on file  Social History Narrative   ** Merged History Encounter **   Patient is right handed.   Patient drinks 3 cups caffeine daily.   Working on 2nd shift now at her job.  08-15-14            Family History:  The patient's family history includes Cancer in her mother; Colitis in her paternal aunt; Ovarian cancer in her maternal grandmother.   ROS:   Please see the history of present illness.    ROS All other systems reviewed and are negative.   PHYSICAL EXAM:   VS:  BP 124/64   Pulse 84   Ht 5\' 3"  (1.6 m)   Wt 144 lb 6.4 oz (65.5 kg)   SpO2 98%   BMI 25.58 kg/m    GEN: Well nourished, well developed, in no acute distress  HEENT: normal  Neck: no JVD, carotid bruits, or masses Cardiac: RRR; no murmurs, rubs, or gallops,no edema  Respiratory:  clear to auscultation bilaterally,  normal work of breathing GI: soft, nontender, nondistended, + BS MS: no deformity or atrophy  Skin: warm and dry, no rash Neuro:  Alert and Oriented x 3, Strength and sensation are intact Psych: euthymic mood, full affect  Wt Readings from Last 3 Encounters:  11/21/18 144 lb 6.4 oz (65.5 kg)  09/07/18 137 lb 12.8 oz (62.5 kg)  08/31/18 135 lb (61.2 kg)      Studies/Labs Reviewed:   EKG:  EKG is not  ordered today.   Recent Labs: 03/07/2018: B Natriuretic Peptide 1,594.7 03/09/2018: Hemoglobin 12.8; Platelets 402 06/28/2018: ALT 15; BUN 14; Creatinine, Ser 0.78; Potassium 3.8; Sodium 138   Lipid Panel No results found for: CHOL, TRIG, HDL, CHOLHDL, VLDL, LDLCALC, LDLDIRECT  Additional studies/ records that were reviewed today include:   Echocardiogram: 07/2018 1. The left ventricle has normal systolic function, with an ejection fraction of 55-60%. The cavity size was normal. Left ventricular diastolic parameters were normal. No evidence of left ventricular regional wall motion abnormalities.  2. The right ventricle has normal systolc function. The cavity was normal. There is no increase in right ventricular wall thickness.  3. No evidence of mitral valve stenosis.  4. The aortic valve is tricuspid Aortic valve regurgitation is trivial by color flow Doppler. No stenosis of the aortic valve.  5. The ascending aorta, aortic arch, aortic root and descending aorta are normal in size and structure.  6. Small secundum atrial septal defect with predominantly left to right shunting across the atrial septum.  RIGHT/LEFT HEART CATH AND CORONARY ANGIOGRAPHY 02/2018 IMPRESSION: Ms. Nish has a nonischemic cardiomyopathy with entirely normal coronary arteries.  Her LVEDP is mildly elevated although her wedge is fairly normal and she has a excellent cardiac output.  Medical therapy will be recommended including lifestyle modification, smoking cessation and avoidance of salt.  The brachial venous  sheath was removed and pressure held.  The radial sheath was removed and a TR band was placed on the right wrist to achieve patent hemostasis.  The patient left the lab in stable condition. Fick Cardiac Output 6 L/min  Fick Cardiac Output Index 3.85 (L/min)/BSA  RA A Wave 5 mmHg  RA V Wave 3 mmHg  RA Mean 2 mmHg  RV Systolic Pressure 34 mmHg  RV Diastolic Pressure 2 mmHg  RV EDP 4 mmHg  PA Systolic Pressure 35 mmHg  PA Diastolic Pressure 17 mmHg  PA Mean 25 mmHg  PW A Wave 19 mmHg  PW V Wave 17 mmHg  PW Mean 15 mmHg  LV Systolic Pressure [id number] mmHg  LV Diastolic Pressure 9 mmHg  LV EDP 20 mmHg  AOp Systolic Pressure [id number] mmHg  AOp Diastolic Pressure 80 mmHg  AOp Mean Pressure [id number] mmHg  LVp Systolic Pressure Q000111Q mmHg  LVp Diastolic Pressure 8 mmHg  LVp EDP Pressure 17 mmHg  QP/QS 1  TPVR Index 6.49 HRUI      ASSESSMENT & PLAN:    1. Nonischemic cardiomyopathy Last echocardiogram June 2020 showed improved LV function to 55 to 60%.  Continue current regimen.  Check BMET.  2.  COPD with tobacco smoking -No active wheezing.  Recommended tobacco cessation.  3.  Hypertension -Blood pressure stable and well-controlled on current medications.   Medication Adjustments/Labs and Tests Ordered: Current medicines are reviewed at length with the patient today.  Concerns regarding medicines are outlined above.  Medication changes, Labs and Tests ordered today are listed in the Patient Instructions below. Patient Instructions  Medication Instructions:  Your physician recommends that you continue on your current medications as directed. Please refer to the Current Medication list given to you today.  If you need a refill on your cardiac medications before your next appointment, please call your pharmacy.   Lab work: TODAY:  BMET  If you have labs (blood work) drawn today and your tests are completely normal, you  will receive your results only by: Marland Kitchen MyChart Message (if you have  MyChart) OR . A paper copy in the mail If you have any lab test that is abnormal or we need to change your treatment, we will call you to review the results.  Testing/Procedures: None ordered  Follow-Up: At [facility], you and your health needs are our priority.  As part of our continuing mission to provide you with exceptional heart care, we have created designated Provider Care Teams.  These Care Teams include your primary Cardiologist (physician) and Advanced Practice Providers (APPs -  Physician Assistants and Nurse Practitioners) who all work together to provide you with the care you need, when you need it. You will need a follow up appointment in:  6 months.  Please call our office 2 months in advance to schedule this appointment.  You may see Mertie Moores, MD or one of the following Advanced Practice Providers on your designated Care Team: Mary Dopp, PA-C [place], [place] . Daune Perch, NP  Any Other Special Instructions Will Be Listed Below (If Applicable).       Jarrett Soho, [place]  11/21/2018 11:45 AM    [facility] [place], [place], [place]  [address] Phone: [phone]; Fax: [phone]

## 2018-11-21 ENCOUNTER — Encounter: Payer: Self-pay | Admitting: Physician Assistant

## 2018-11-21 ENCOUNTER — Ambulatory Visit: Payer: Commercial Managed Care - PPO | Admitting: Physician Assistant

## 2018-11-21 ENCOUNTER — Other Ambulatory Visit: Payer: Self-pay

## 2018-11-21 VITALS — BP 124/64 | HR 84 | Ht 63.0 in | Wt 144.4 lb

## 2018-11-21 DIAGNOSIS — I10 Essential (primary) hypertension: Secondary | ICD-10-CM | POA: Diagnosis not present

## 2018-11-21 DIAGNOSIS — I428 Other cardiomyopathies: Secondary | ICD-10-CM

## 2018-11-21 DIAGNOSIS — Z79899 Other long term (current) drug therapy: Secondary | ICD-10-CM | POA: Diagnosis not present

## 2018-11-21 DIAGNOSIS — Z720 Tobacco use: Secondary | ICD-10-CM

## 2018-11-21 DIAGNOSIS — J449 Chronic obstructive pulmonary disease, unspecified: Secondary | ICD-10-CM

## 2018-11-21 DIAGNOSIS — I5042 Chronic combined systolic (congestive) and diastolic (congestive) heart failure: Secondary | ICD-10-CM

## 2018-11-21 NOTE — Patient Instructions (Signed)
Medication Instructions:  Your physician recommends that you continue on your current medications as directed. Please refer to the Current Medication list given to you today.  If you need a refill on your cardiac medications before your next appointment, please call your pharmacy.   Lab work: TODAY:  BMET  If you have labs (blood work) drawn today and your tests are completely normal, you will receive your results only by: . MyChart Message (if you have MyChart) OR . A paper copy in the mail If you have any lab test that is abnormal or we need to change your treatment, we will call you to review the results.  Testing/Procedures: None ordered  Follow-Up: At [facility], you and your health needs are our priority.  As part of our continuing mission to provide you with exceptional heart care, we have created designated Provider Care Teams.  These Care Teams include your primary Cardiologist (physician) and Advanced Practice Providers (APPs -  Physician Assistants and Nurse Practitioners) who all work together to provide you with the care you need, when you need it. You will need a follow up appointment in:  6 months.  Please call our office 2 months in advance to schedule this appointment.  You may see Philip Nahser, MD or one of the following Advanced Practice Providers on your designated Care Team: Scott Weaver, PA-C Vin Bhagat, PA-C . Janine Hammond, NP  Any Other Special Instructions Will Be Listed Below (If Applicable).    

## 2018-11-22 LAB — BASIC METABOLIC PANEL
BUN/Creatinine Ratio: 13 (ref 9–23)
BUN: 11 mg/dL (ref 6–24)
CO2: 23 mmol/L (ref 20–29)
Calcium: 8.2 mg/dL — ABNORMAL LOW (ref 8.7–10.2)
Chloride: 103 mmol/L (ref 96–106)
Creatinine, Ser: 0.87 mg/dL (ref 0.57–1.00)
GFR calc Af Amer: 87 mL/min/{1.73_m2} (ref >59)
GFR calc non Af Amer: 76 mL/min/{1.73_m2} (ref >59)
Glucose: 103 mg/dL — ABNORMAL HIGH (ref 65–99)
Potassium: 3.6 mmol/L (ref 3.5–5.2)
Sodium: 142 mmol/L (ref 134–144)

## 2018-12-09 ENCOUNTER — Encounter: Payer: Self-pay | Admitting: *Deleted

## 2018-12-09 ENCOUNTER — Ambulatory Visit: Payer: Commercial Managed Care - PPO | Admitting: Internal Medicine

## 2018-12-09 ENCOUNTER — Encounter: Payer: Self-pay | Admitting: Internal Medicine

## 2018-12-09 ENCOUNTER — Other Ambulatory Visit: Payer: Self-pay

## 2018-12-09 DIAGNOSIS — J449 Chronic obstructive pulmonary disease, unspecified: Secondary | ICD-10-CM | POA: Diagnosis not present

## 2018-12-09 DIAGNOSIS — F17210 Nicotine dependence, cigarettes, uncomplicated: Secondary | ICD-10-CM | POA: Diagnosis not present

## 2018-12-09 DIAGNOSIS — J320 Chronic maxillary sinusitis: Secondary | ICD-10-CM

## 2018-12-09 MED ORDER — SPIRONOLACTONE 25 MG PO TABS: ORAL_TABLET | ORAL | 11 refills | Status: DC

## 2018-12-09 NOTE — Patient Instructions (Addendum)
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   Please schedule a follow up visit in 3 months but call sooner if needed

## 2018-12-09 NOTE — Progress Notes (Signed)
Mary Franco, female    DOB: 06-28-1965,     MRN: [id number]   Brief patient profile:  33 yowf active smoker with healthy childhood and last IUP  Age 53 with tendency to sinus infections/ bronchitis starting around 2005 couple times a year occ needed neb that she borrowed from relative but worse since winter  2019 dx with by Nahser with chf some better then in January 19th to [place] then to [facility] then home and says never walked in hallway and maybe 50-60% better but could not get to [place] / nor shop at [employer] and they started smoking and no nebulizer broke    R/L [facility] 03/08/18 EF 40 LVEDP 20 with CO 6lpm   PA mean 25/wedge 15 so PVR = 10/6 = wnl   History of Present Illness  03/30/2018  Pulmonary/ 1st office eval/Mary Franco  Chief Complaint  Patient presents with  . Consult    Referred by Dr. Hassell Done due to wheezing, coughing, chest tightness and congestion. Pt states when she coughs, she is unable to get any mucus to come up.  Dyspnea:  MMRC3 = can't walk 100 yards even at a slow pace at a flat grade s stopping due to sob   Cough: nothing much now, min mucoid in ams Sleep: maybe 30 degrees due to HB SABA use: 4 x daily, rarely bedtime rec Plan A = Automatic = Symbicort 80 Take 2 puffs first thing in am and then another 2 puffs about 12 hours later.  Work on inhaler technique:   Plan B = Backup Only use your albuterol inhaler    08/31/2018  f/u ov/Mary Franco re: GOLD II copd / still smoking  Chief Complaint  Patient presents with  . 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  The key is to stop smoking completely  before smoking completely stops you! Plan A = Automatic = Trelegy one click each am  Plan B = Backup Only use your albuterol inhaler(proair respiclick)  as a rescue medication  augmentin 875 bid x 10 days for nasal congestion "    09/05/18 tried to go back to work sent home p 1.5 h no explanation per pt  09/06/18 and stayed 5.5 h in no A/C and then had cc couldn't breathe and inhaler didn't work and informed supervisor and not offered a fan and texted him to report she had to leave.     09/07/2018  f/u ov/Mary Franco re:  GOLD II copd/ still smoking / maint trelegy  Chief Complaint  Patient presents with  . 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   Dyspnea:  Still MMRC =2 but cannot tol heat > severe sweating, coughing, sob, much better inac  Cough: mostly dry  Sleeping: flat  one pillow/ ok p xanax  SABA use: only at work  rec Augmentin 875 mg take one pill twice daily  X 10 days  If sinuses not better call for ENT eval > did not do  Cannot be expected to work in an un-airconditioned environment due to  COPD with an asthmatic component   12/09/2018  f/u ov/Mary Franco re: GOLD II still smoking  Chief Complaint  Patient presents with  . Follow-up    has not been able to work with the restrictions that we gave her from last visit. She states that her breathing has not improved much.   Dyspnea:  MMRC2 = can't walk a nl pace on a flat grade s sob but does fine slow and flat  Cough: feels triggered by pnds/ no better p augmentin rx  / mostly dry  Sleeping: p xanax flat bed one pillow SABA use: up to twice daily  02: none    No obvious day to day or daytime variability or assoc excess/ purulent sputum or mucus plugs or hemoptysis or cp or chest tightness, subjective wheeze or overt   hb symptoms.   Sleeping as above without nocturnal  or early am exacerbation  of respiratory  c/o's or  need for noct saba. Also denies any obvious fluctuation of symptoms with weather or environmental changes or other aggravating or alleviating factors except as outlined above   No unusual exposure hx or h/o childhood pna/ asthma or knowledge of premature birth.  Current Allergies, Complete Past Medical History, Past Surgical History, Family History, and Social History were reviewed in [employer] record.  ROS  The following are not active complaints unless bolded Hoarseness, sore throat, dysphagia, dental problems, itching, sneezing,  nasal congestion or discharge of excess mucus or purulent secretions, ear ache,   fever, chills, sweats, unintended wt loss or wt gain, classically pleuritic or exertional cp,  orthopnea pnd or arm/hand swelling  or leg swelling, presyncope, palpitations, abdominal pain, anorexia, nausea, vomiting, diarrhea  or change in bowel habits or change in bladder habits, change in stools or change in urine, dysuria, hematuria,  rash, arthralgias, visual complaints, headache, numbness, weakness or ataxia or problems with walking or coordination,  change in mood or  memory.        Current Meds  Medication Sig  . Albuterol Sulfate (PROAIR RESPICLICK) 123XX123 (90 Base) MCG/ACT AEPB Inhale 1-2 puffs into the lungs every 4 (four) hours as needed.  Marland Kitchen alprazolam (XANAX) 2 MG tablet Take 2 mg by mouth at bedtime as needed.  . bisoprolol (ZEBETA) 5 MG tablet Take 2.5 mg daily for 1 week and then take 5 mg daily.  Marland Kitchen esomeprazole (NEXIUM) 20 MG capsule Take 40 mg by mouth daily at 12 noon.  . Fluticasone-Umeclidin-Vilant (TRELEGY ELLIPTA) 100-62.5-25 MCG/INH AEPB Inhale 1 puff into the lungs daily.  Marland Kitchen ibuprofen (ADVIL,MOTRIN) 200 MG tablet Take 600 mg by mouth every 6 (six) hours as needed for headache or moderate pain.  Marland Kitchen losartan (COZAAR) 25 MG tablet Take 1 tablet (25 mg total) by mouth daily.  . Magnesium 500 MG TABS Take 1 tablet by mouth daily.  . Melatonin 3 MG  TABS Take 3 mg by mouth at bedtime as needed (sleep).   . potassium chloride SA (KLOR-CON M20) 20 MEQ tablet Take 1 tablet (20 mEq total) by mouth daily.  . promethazine (PHENERGAN) 25 MG tablet Take 1 tablet (25 mg total) by mouth every 8 (eight) hours as needed for nausea. (Patient taking differently: Take 25 mg by mouth every 8 (eight) hours as needed for nausea. 1/2 tablet as needed)  . spironolactone (ALDACTONE) 25 MG  tablet Take 0.5 tablets (12.5 mg total) by mouth daily.            Objective:     amb wf, minimal rattling cough     12/09/2018      142  09/07/2018        137   08/31/18 135 lb (61.2 kg)  07/26/18 130 lb (59 kg)  04/14/18 130 lb (59 kg)    Vital signs reviewed - Note on arrival 02 sats  94% on RA     HEENT : pt wearing mask not removed for exam due to covid - 19 concerns.   NECK :  without JVD/Nodes/TM/ nl carotid upstrokes bilaterally   LUNGS: no acc muscle use,  Min barrel  contour chest wall with bilateral  slightly decreased bs s audible wheeze and  without cough on insp or exp maneuvers and min  Hyperresonant  to  percussion bilaterally     CV:  RRR  no s3 or murmur or increase in P2, and no edema   ABD:  soft and nontender with pos end  insp Hoover's  in the supine position. No bruits or organomegaly appreciated, bowel sounds nl  MS:   Nl gait/  ext warm without deformities, calf tenderness, cyanosis or clubbing No obvious joint restrictions   SKIN: warm and dry without lesions    NEURO:  alert, approp, nl sensorium with  no motor or cerebellar deficits apparent.                Assessment

## 2018-12-11 ENCOUNTER — Encounter: Payer: Self-pay | Admitting: Internal Medicine

## 2018-12-11 NOTE — Assessment & Plan Note (Addendum)
Onset 2005  - augmentin x 10 days 08/31/18  > no improvement  - 12/09/2018 referred to ENT

## 2018-12-11 NOTE — Assessment & Plan Note (Signed)
Counseled re importance of smoking cessation but did not meet time criteria for separate billing     I had an extended discussion with the patient reviewing all relevant studies completed to date and  lasting 15 to 20 minutes of a 25 minute visit    Each maintenance medication was reviewed in detail including most importantly the difference between maintenance and prns and under what circumstances the prns are to be triggered using an action plan format that is not reflected in the computer generated alphabetically organized AVS.     Please see AVS for specific instructions unique to this visit that I personally wrote and verbalized to the the pt in detail and then reviewed with pt  by my nurse highlighting any  changes in therapy recommended at today's visit to their plan of care.   

## 2018-12-11 NOTE — Assessment & Plan Note (Addendum)
Active smoker - 03/30/2018   Walked RA  3  laps @  approx 275ft each @ avg pace  stopped due to end of study, mild sob and chest tight, no desats   - Spirometry 03/30/2018  FEV1 2.4  (94%)  Ratio 0.73 s physiologic curvature p > 4 h since last saba  - 03/30/2018    try trelegy sample to see if reduces need for saba   - 04/14/2018  After extensive coaching inhaler device,  effectiveness =    75% from basline 50% > try symb 80 2bid  - 08/31/2018  After extensive coaching inhaler device,  effectiveness =    90% with dpi  rx trelegy  -  PFT's  08/31/2018  FEV1 1.89 (72 % ) ratio 0.58  p 11 % improvement from saba p nothing prior to study with DLCO  60 % corrects to 53  % for alv volume    Group D in terms of symptom/risk and laba/lama/ICS  therefore appropriate rx at this point >>>  Continue trelegy   Pt informed of the seriousness of COVID 19 infection as a direct risk to their health  and safey and to those of their loved ones and should continue to wear facemask in public and minimize exposure to public locations but especially avoid any area or activity where non-close contacts are not observing distancing or wearing an appropriate face mask.   If can't isolate as above should not be working until [employer] are changed/ discussed

## 2018-12-29 DIAGNOSIS — H9192 Unspecified hearing loss, left ear: Secondary | ICD-10-CM

## 2018-12-29 HISTORY — DX: Unspecified hearing loss, left ear: H91.92

## 2019-02-01 ENCOUNTER — Telehealth: Payer: Self-pay | Admitting: Internal Medicine

## 2019-02-01 NOTE — Telephone Encounter (Signed)
Spoke with the pt and notified we do not carry samples of rescue Inhalers  Offered to send in rx and she refused this  Will call if she changes her mind

## 2019-03-13 ENCOUNTER — Ambulatory Visit: Payer: Commercial Managed Care - PPO | Admitting: Internal Medicine

## 2019-05-02 ENCOUNTER — Other Ambulatory Visit: Payer: Self-pay | Admitting: Physician Assistant

## 2019-05-09 ENCOUNTER — Ambulatory Visit: Payer: Commercial Managed Care - PPO | Admitting: Internal Medicine

## 2019-05-24 ENCOUNTER — Ambulatory Visit (INDEPENDENT_AMBULATORY_CARE_PROVIDER_SITE_OTHER): Payer: BC Managed Care – PPO

## 2019-05-24 ENCOUNTER — Ambulatory Visit: Payer: BC Managed Care – PPO | Admitting: Internal Medicine

## 2019-05-24 ENCOUNTER — Encounter: Payer: Self-pay | Admitting: Internal Medicine

## 2019-05-24 ENCOUNTER — Other Ambulatory Visit: Payer: Self-pay

## 2019-05-24 DIAGNOSIS — J449 Chronic obstructive pulmonary disease, unspecified: Secondary | ICD-10-CM

## 2019-05-24 DIAGNOSIS — F17210 Nicotine dependence, cigarettes, uncomplicated: Secondary | ICD-10-CM | POA: Diagnosis not present

## 2019-05-24 DIAGNOSIS — J329 Chronic sinusitis, unspecified: Secondary | ICD-10-CM | POA: Diagnosis not present

## 2019-05-24 LAB — BASIC METABOLIC PANEL
BUN: 13 mg/dL (ref 6–23)
CO2: 30 mEq/L (ref 19–32)
Calcium: 9.1 mg/dL (ref 8.4–10.5)
Chloride: 100 mEq/L (ref 96–112)
Creatinine, Ser: 0.79 mg/dL (ref 0.40–1.20)
GFR: 75.48 mL/min (ref >60.00)
Glucose, Bld: 97 mg/dL (ref 70–99)
Potassium: 3.4 mEq/L — ABNORMAL LOW (ref 3.5–5.1)
Sodium: 137 mEq/L (ref 135–145)

## 2019-05-24 LAB — CBC WITH DIFFERENTIAL/PLATELET
Basophils Absolute: 0.1 10*3/uL (ref 0.0–0.1)
Basophils Relative: 0.8 % (ref 0.0–3.0)
Eosinophils Absolute: 0.1 10*3/uL (ref 0.0–0.7)
Eosinophils Relative: 0.8 % (ref 0.0–5.0)
HCT: 38.5 % (ref 36.0–46.0)
Hemoglobin: 12.8 g/dL (ref 12.0–15.0)
Lymphocytes Relative: 38.5 % (ref 12.0–46.0)
Lymphs Abs: 3.1 10*3/uL (ref 0.7–4.0)
MCHC: 33.3 g/dL (ref 30.0–36.0)
MCV: 92.5 fl (ref 78.0–100.0)
Monocytes Absolute: 0.7 10*3/uL (ref 0.1–1.0)
Monocytes Relative: 8.3 % (ref 3.0–12.0)
Neutro Abs: 4.1 10*3/uL (ref 1.4–7.7)
Neutrophils Relative %: 51.6 % (ref 43.0–77.0)
Platelets: 366 10*3/uL (ref 150.0–400.0)
RBC: 4.17 Mil/uL (ref 3.87–5.11)
RDW: 14 % (ref 11.5–15.5)
WBC: 8 10*3/uL (ref 4.0–10.5)

## 2019-05-24 MED ORDER — TRELEGY ELLIPTA 100-62.5-25 MCG/INH IN AEPB: 1.0000 | INHALATION_SPRAY | Freq: Every day | RESPIRATORY_TRACT | 0 refills | Status: DC

## 2019-05-24 MED ORDER — FAMOTIDINE 20 MG PO TABS: ORAL_TABLET | ORAL | 11 refills | Status: DC

## 2019-05-24 MED ORDER — PANTOPRAZOLE SODIUM 40 MG PO TBEC: 40.0000 mg | DELAYED_RELEASE_TABLET | Freq: Every day | ORAL | 2 refills | Status: DC

## 2019-05-24 NOTE — Patient Instructions (Addendum)
Finish augmentin then call me for sinus CT or see Redmond Baseman if still having nasal/ sinus compliments   Restart trelegy one click each daily  Pantoprazole (protonix) 40 mg   Take  30-60 min before first meal  and Pepcid (famotidine)  20 mg one @  bedtime until return to office - this is the best way to tell whether stomach acid is contributing to your problem.    GERD (REFLUX)  is an extremely common cause of respiratory symptoms just like yours , many times with no obvious heartburn at all.    It can be treated with medication, but also with lifestyle changes including elevation of the head of your bed (ideally with 6 -8inch blocks under the headboard of your bed),  Smoking cessation, avoidance of late meals, excessive alcohol, and avoid fatty foods, chocolate, peppermint, colas, red wine, and acidic juices such as orange juice.  NO MINT OR MENTHOL PRODUCTS SO NO COUGH DROPS  USE SUGARLESS CANDY INSTEAD (Jolley ranchers or Stover's or Life Savers) or even ice chips will also do - the key is to swallow to prevent all throat clearing. NO OIL BASED VITAMINS - use powdered substitutes.  Avoid fish oil when coughing.    Please remember to go to the lab and x-ray department   for your tests - we will call you with the results when they are available.   The key is to stop smoking completely before smoking completely stops you!  For smoking cessation info call [phone]      Please schedule a follow up office visit in 4 weeks, sooner if needed  with all medications /inhalers/ solutions in hand so we can verify exactly what you are taking. This includes all medications from all doctors and over the counters

## 2019-05-24 NOTE — Progress Notes (Signed)
Mary Franco, female    DOB: 1965-05-08,     MRN: [id number]   Brief patient profile:  16 yowf active smoker with healthy childhood and last IUP  Age 54 with tendency to sinus infections/ bronchitis starting around 2005 couple times a year occ needed neb that she borrowed from relative but worse since winter  2019 dx with by Nahser with chf some better then in January 19th to [place] then to [facility] then home and says never walked in hallway and maybe 50-60% better but could not get to [place] / nor shop at [employer] and they started smoking and no nebulizer broke    R/L [facility] 03/08/18 EF 40 LVEDP 20 with CO 6lpm   PA mean 25/wedge 15 so PVR = 10/6 = wnl   History of Present Illness  03/30/2018  Pulmonary/ 1st office eval/Eurydice Calixto  Chief Complaint  Patient presents with  . Consult    Referred by Dr. Hassell Done due to wheezing, coughing, chest tightness and congestion. Pt states when she coughs, she is unable to get any mucus to come up.  Dyspnea:  MMRC3 = can't walk 100 yards even at a slow pace at a flat grade s stopping due to sob   Cough: nothing much now, min mucoid in ams Sleep: maybe 30 degrees due to HB SABA use: 4 x daily, rarely bedtime rec Plan A = Automatic = Symbicort 80 Take 2 puffs first thing in am and then another 2 puffs about 12 hours later.  Work on inhaler technique:   Plan B = Backup Only use your albuterol inhaler    08/31/2018  f/u ov/Kierah Goatley re: GOLD II copd / still smoking  Chief Complaint  Patient presents with  . 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  The key is to stop smoking completely  before smoking completely stops you! Plan A = Automatic = Trelegy one click each am  Plan B = Backup Only use your albuterol inhaler(proair respiclick)  as a rescue medication  augmentin 875 bid x 10 days for nasal congestion "    09/05/18 tried to go back to work sent home p 1.5 h no explanation per pt  09/06/18 and stayed 5.5 h in no A/C and then had cc couldn't breathe and inhaler didn't work and informed supervisor and not offered a fan and texted him to report she had to leave.     09/07/2018  f/u ov/Starkeisha Vanwinkle re:  GOLD II copd/ still smoking / maint trelegy  Chief Complaint  Patient presents with  . 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   Dyspnea:  Still MMRC =2 but cannot tol heat > severe sweating, coughing, sob, much better inac  Cough: mostly dry  Sleeping: flat  one pillow/ ok p xanax  SABA use: only at work  rec Augmentin 875 mg take one pill twice daily  X 10 days  If sinuses not better call for ENT eval > did not do  Cannot be expected to work in an un-airconditioned environment due to  COPD with an asthmatic component   12/09/2018  f/u ov/June Rode re: GOLD II still smoking  Chief Complaint  Patient presents with  . Follow-up    has not been able to work with the restrictions that we gave her from last visit. She states that her breathing has not improved much.  Dyspnea:  MMRC2 = can't walk a nl pace on a flat grade s sob but does fine slow and flat  Cough: feels triggered by pnds/ no better p augmentin rx  / mostly dry  Sleeping: p xanax flat bed one pillow SABA use: up to twice daily  rec Ok to take full dose of spirolactone until swelling better then reduce back to one-half  daily  Blow out trelegy out thru nose  We will to set you up with an ENT doctor    05/24/2019  f/u ov/Jarelis Ehlert re:  GOLD II still smoking / off trelegy due to cost/lost insurance  Chief  Complaint  Patient presents with  . Follow-up    Increased SOB and CP x 3-4 wks. She states has been without any inhalers for the past few months due to no insurance. She was recently prescribed augmentin for sore throat but not started yet.   Dyspnea: works at [employer], slowly up [employer] and a bit worse since off trelegy and no saba either  Cough: assoc with pnds,   esp in am with mucus turning yellow x sev weeks - was given augmentin by gyn but hasn't started  Sleeping: one pillow/ bed is flat ok p xanax  SABA use: none 02: none  New sporadic midlie cp x 3-4 weeks no worse with deep breath or walking /  Usually   not present noct,  assoc with hb/gas on nexium otc but not ac   No obvious day to day or daytime variability or assoc  mucus plugs or hemoptysis or cp or chest tightness, subjective wheeze .     Also denies any obvious fluctuation of symptoms with weather or environmental changes or other aggravating or alleviating factors except as outlined above   No unusual exposure hx or h/o childhood pna/ asthma or knowledge of premature birth.  Current Allergies, Complete Past Medical History, Past Surgical History, Family History, and Social History were reviewed in [employer] record.  ROS  The following are not active complaints unless bolded Hoarseness, sore throat, dysphagia, dental problems, itching, sneezing,  nasal congestion or discharge of excess mucus or purulent secretions, ear ache,   fever, chills, sweats, unintended wt loss or wt gain, classically pleuritic or exertional cp,  orthopnea pnd or arm/hand swelling  or leg swelling, presyncope, palpitations, abdominal pain, anorexia, nausea, vomiting, diarrhea  or change in bowel habits or change in bladder habits, change in stools or change in urine, dysuria, hematuria,  rash, arthralgias, visual complaints, headache, numbness, weakness or ataxia or problems with walking or coordination,  change in mood or   memory.        Current Meds  Medication Sig  . alprazolam (XANAX) 2 MG tablet Take 2 mg by mouth at bedtime as needed.  . bisoprolol (ZEBETA) 5 MG tablet Take 1 tablet (5 mg total) by mouth daily.  Marland Kitchen esomeprazole (NEXIUM) 20 MG capsule Take 40 mg by mouth daily at 12 noon.  Marland Kitchen losartan (COZAAR) 25 MG tablet Take  1 tablet (25 mg total) by mouth daily.  . Magnesium 500 MG TABS Take 1 tablet by mouth daily.  . Melatonin 3 MG TABS Take 3 mg by mouth at bedtime as needed (sleep).   . potassium chloride SA (KLOR-CON M20) 20 MEQ tablet Take 1 tablet (20 mEq total) by mouth daily.  . promethazine (PHENERGAN) 25 MG tablet Take 1 tablet (25 mg total) by mouth every 8 (eight) hours as needed for nausea. (Patient taking differently: Take 25 mg by mouth every 8 (eight) hours as needed for nausea. 1/2 tablet as needed)  . spironolactone (ALDACTONE) 25 MG tablet Take one daily until swelling better then one-half  daily                       Objective:     amb wf / mild rattling cough on voluntary maneuver   05/24/2019          137  12/09/2018      142  09/07/2018        137   08/31/18 135 lb (61.2 kg)  07/26/18 130 lb (59 kg)  04/14/18 130 lb (59 kg)    Vital signs reviewed  05/24/2019  - Note at rest 02 sats  98% on RA       HEENT : pt wearing mask not removed for exam due to covid - 19 concerns.    NECK :  without JVD/Nodes/TM/ nl carotid upstrokes bilaterally   LUNGS: no acc muscle use,  Mild barrel  contour chest wall with bilateral  Distant bs s audible wheeze and  without cough on insp or exp maneuvers  and mild  Hyperresonant  to  percussion bilaterally     CV:  RRR  no s3 or murmur or increase in P2, and no edema   ABD:  soft and nontender with pos end  insp Hoover's  in the supine position. No bruits or organomegaly appreciated, bowel sounds nl  MS:   Nl gait/  ext warm without deformities, calf tenderness, cyanosis or clubbing No obvious joint restrictions   SKIN: warm  and dry without lesions    NEURO:  alert, approp, nl sensorium with  no motor or cerebellar deficits apparent.       CXR PA and Lateral:   05/24/2019 :    I personally reviewed images and agree with radiology impression as follows:   No active cardiopulmonary disease.  Labs ordered/ reviewed:      Chemistry      Component Value Date/Time   NA 137 05/24/2019 0940   NA 142 11/21/2018 1149   K 3.4 (L) 05/24/2019 0940   CL 100 05/24/2019 0940   CO2 30 05/24/2019 0940   BUN 13 05/24/2019 0940   BUN 11 11/21/2018 1149   CREATININE 0.79 05/24/2019 0940      Component Value Date/Time   CALCIUM 9.1 05/24/2019 0940   ALKPHOS 95 06/28/2018 1055   AST 29 06/28/2018 1055   ALT 15 06/28/2018 1055   BILITOT 0.2 06/28/2018 1055        Lab Results  Component Value Date   WBC 8.0 05/24/2019   HGB 12.8 05/24/2019   HCT 38.5 05/24/2019   MCV 92.5 05/24/2019   PLT 366.0 05/24/2019       EOS  0.1                                   05/24/2019   Labs ordered 05/24/2019  :  allergy profile   alpha one AT phenotype                  Assessment

## 2019-05-25 ENCOUNTER — Encounter: Payer: Self-pay | Admitting: Internal Medicine

## 2019-05-25 NOTE — Assessment & Plan Note (Signed)
Onset 2005  - augmentin x 10 days 08/31/18   - 12/09/2018 referred to ENT  Redmond Baseman eval >  - Sinus CT p course of augmentin started 05/24/2019 if not better

## 2019-05-25 NOTE — Assessment & Plan Note (Signed)
4-5 min discussion re active cigarette smoking in addition to office E&M  Ask about tobacco use:   ongoing Advise quitting   I took an extended  opportunity with this patient to outline the consequences of continued cigarette use  in airway disorders based on all the data we have from the multiple national lung health studies (perfomed over decades at millions of dollars in cost)  indicating that smoking cessation, not choice of inhalers or physicians, is the most important aspect of care.   Assess willingness:  Not committed at this point - says can't keep anyone from smoking even in her own home and everyone smokes around her when visits relatives Assist in quit attempt:  Per PCP when ready Arrange follow up:   Follow up per Primary Care planned  For smoking cessation info call [phone]

## 2019-05-25 NOTE — Assessment & Plan Note (Addendum)
Active smoker - 03/30/2018   Walked RA  3  laps @  approx 269ft each @ avg pace  stopped due to end of study, mild sob and chest tight, no desats   - Spirometry 03/30/2018  FEV1 2.4  (94%)  Ratio 0.73 s physiologic curvature p > 4 h since last saba  - 03/30/2018    try trelegy sample to see if reduces need for saba   - 04/14/2018  After extensive coaching inhaler device,  effectiveness =    75% from basline 50% > try symb 80 2bid  - 08/31/2018  After extensive coaching inhaler device,  effectiveness =    90% with dpi  rx trelegy  -  PFT's  08/31/2018  FEV1 1.89 (72 % ) ratio 0.58  p 11 % improvement from saba p nothing prior to study with DLCO  60 % corrects to 53  % for alv volume  - 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   DDX of  difficult airways management almost all start with A and  include Adherence, Ace Inhibitors, Acid Reflux, Active Sinus Disease, Alpha 1 Antitripsin deficiency, Anxiety masquerading as Airways dz,  ABPA,  Allergy(esp in young), Aspiration (esp in elderly), Adverse effects of meds,  Active smoking or vaping, A bunch of PE's (a small clot burden can't cause this syndrome unless there is already severe underlying pulm or vascular dz with poor reserve) plus two Bs  = Bronchiectasis and Beta blocker use..and one C= CHF   Adherence is always the initial "prime suspect" and is a multilayered concern that requires a "trust but verify" approach in every patient - starting with knowing how to use medications, especially inhalers, correctly, keeping up with refills and understanding the fundamental difference between maintenance and prns vs those medications only taken for a very short course and then stopped and not refilled.  - notified to call right away if can't access meds rec here - return with all meds in hand using a trust but  verify approach to confirm accurate Medication  Reconciliation The principal here is that until we are certain that the  patients are doing what we've asked, it makes no sense to ask them to do more.   Active smoking > see sep a/p  ? Acid (or non-acid) GERD > always difficult to exclude as up to 75% of pts in some series report no assoc GI/ Heartburn symptoms and she has atypical midline cp assoc with sore throat > rec max (24h)  acid suppression and diet restrictions/ reviewed and instructions given in writing.   ? Allergy / asthma > check IgE but note Eos only 0.1 so less likely >>  Prednisone 10 mg take  4 each am x 2 days,   2 each am x 2 days,  1 each am x 2 days and stop /  Group D in terms of symptom/risk and laba/lama/ICS  therefore appropriate rx at this point >>>  Resume trelegy   ? Sinus dz > augmentin then f/u with sinus ct or [facility] if not better, blow trelegy out thru nose   ? Alpha one AT def > send screen   ? ABPA > send screen  ? Chf/IHD > R/L HC reviewed from 03/08/18 makes IHD  much less likely / no ex cp, no orthopnea or leg swelling or cm on cxr to suggest chf now    Advised: Advised:  formulary restrictions will be an ongoing challenge for the forseable future and I would be happy to pick an alternative if the pt will first  provide me a list of them -  pt  will need to return here for training for any new device that is required eg dpi vs hfa vs respimat.    In the meantime we can always provide samples so that the patient never runs out of any needed respiratory medications.   Pt informed of the seriousness of COVID 19 infection as a direct risk to lung health  and safey and to close contacts and should continue to wear a facemask in public and minimize exposure to public locations but especially avoid any area or activity where non-close contacts are not observing distancing or wearing an appropriate face mask.  I strongly recommended vaccine when offered.              Each maintenance medication was reviewed in detail including emphasizing most importantly the difference between maintenance and prns and under what circumstances the prns are to be triggered using an action plan format where appropriate.  Total time for H and P, chart review, counseling,  directly observing portions of ambulatory 02 saturation study/  and generating customized AVS unique to this office visit / charting = 30 min

## 2019-05-26 ENCOUNTER — Other Ambulatory Visit: Payer: Self-pay | Admitting: Internal Medicine

## 2019-05-26 MED ORDER — PANTOPRAZOLE SODIUM 40 MG PO TBEC: 40.0000 mg | DELAYED_RELEASE_TABLET | Freq: Every day | ORAL | 6 refills | Status: DC

## 2019-05-29 NOTE — Progress Notes (Signed)
Called and left detailed msg on machine ok per dpr

## 2019-05-30 NOTE — Progress Notes (Signed)
LMTCB

## 2019-06-01 LAB — ALPHA-1 ANTITRYPSIN PHENOTYPE: A-1 Antitrypsin, Ser: 129 mg/dL (ref 83–199)

## 2019-06-01 LAB — IGE: IgE (Immunoglobulin E), Serum: 2790 kU/L — ABNORMAL HIGH (ref <=114)

## 2019-06-07 NOTE — Progress Notes (Signed)
LMTCB

## 2019-06-15 ENCOUNTER — Telehealth: Payer: Self-pay | Admitting: Internal Medicine

## 2019-06-15 NOTE — Telephone Encounter (Signed)
Left message for patient to confirm pharmacy for refill.   When patient calls back please confirm email and we can send in refill. Thanks.

## 2019-06-16 ENCOUNTER — Telehealth: Payer: Self-pay | Admitting: Internal Medicine

## 2019-06-16 MED ORDER — TRELEGY ELLIPTA 100-62.5-25 MCG/INH IN AEPB: 1.0000 | INHALATION_SPRAY | Freq: Every day | RESPIRATORY_TRACT | 5 refills | Status: DC

## 2019-06-16 NOTE — Telephone Encounter (Signed)
Spoke with patient. She stated that she was at a yard sale about 2 weeks ago 06/02/19 and was tackled by a dog from behind and fell face forward to the ground. She was checked out by a local UC and had xrays. She was told that she did not break anything but she had some bruising. Since then she has been having issues with taking deep breaths. She has been taking OTC pain relief and does not want to take any narcotics. She was prescribed Flexeril but does not like the way it makes her feel.   She has been using cold compresses on her chest and back. She has been on light duty at work.   She wants to know if MW has any additional recommendations.   MW, please advise. Thanks!

## 2019-06-16 NOTE — Telephone Encounter (Signed)
Spoke with patient. She is aware of MWs recs. Nothing further needed at time of call.  

## 2019-06-16 NOTE — Telephone Encounter (Signed)
Patient is returning phone call. Patient phone number is [phone].

## 2019-06-16 NOTE — Telephone Encounter (Signed)
I don't have anything stronger to offer over the phone (she's right to not take narcotics but doesn't sound like milder medications are helping)  but bruised ribs typically hurt ro 4-6 weeks  abd happy to see in office her or [facility] office prn

## 2019-06-16 NOTE — Telephone Encounter (Signed)
LMTCB x1 for pt.  

## 2019-06-16 NOTE — Telephone Encounter (Signed)
Spoke with patient about other message. She stated that she needs a refill on her Trelegy to be sent to [facility] in [place]. I advised her that I would go ahead and send this in for her. She verbalized understanding.   Nothing further needed at time of call.

## 2019-06-19 NOTE — Progress Notes (Signed)
LMTCB

## 2019-06-26 ENCOUNTER — Ambulatory Visit: Payer: BC Managed Care – PPO | Admitting: Internal Medicine

## 2019-06-26 ENCOUNTER — Other Ambulatory Visit: Payer: Self-pay

## 2019-06-26 ENCOUNTER — Ambulatory Visit (INDEPENDENT_AMBULATORY_CARE_PROVIDER_SITE_OTHER): Payer: BC Managed Care – PPO

## 2019-06-26 ENCOUNTER — Encounter: Payer: Self-pay | Admitting: Internal Medicine

## 2019-06-26 DIAGNOSIS — R0789 Other chest pain: Secondary | ICD-10-CM | POA: Diagnosis not present

## 2019-06-26 DIAGNOSIS — J449 Chronic obstructive pulmonary disease, unspecified: Secondary | ICD-10-CM

## 2019-06-26 DIAGNOSIS — F17210 Nicotine dependence, cigarettes, uncomplicated: Secondary | ICD-10-CM | POA: Diagnosis not present

## 2019-06-26 HISTORY — DX: Other chest pain: R07.89

## 2019-06-26 MED ORDER — BREZTRI AEROSPHERE 160-9-4.8 MCG/ACT IN AERO: 2.0000 | INHALATION_SPRAY | Freq: Two times a day (BID) | RESPIRATORY_TRACT | 11 refills | Status: DC

## 2019-06-26 MED ORDER — ALBUTEROL SULFATE HFA 108 (90 BASE) MCG/ACT IN AERS
INHALATION_SPRAY | RESPIRATORY_TRACT | 11 refills | Status: DC
Start: 2019-06-26 — End: 2020-10-07

## 2019-06-26 MED ORDER — BREZTRI AEROSPHERE 160-9-4.8 MCG/ACT IN AERO: 2.0000 | INHALATION_SPRAY | Freq: Two times a day (BID) | RESPIRATORY_TRACT | 0 refills | Status: DC

## 2019-06-26 NOTE — Assessment & Plan Note (Signed)
Fell end of April 2021 > neg cxr 06/26/2019 with parasthesias > try zostrix

## 2019-06-26 NOTE — Progress Notes (Signed)
Mary Franco, female    DOB: 06-28-1965,     MRN: [id number]   Brief patient profile:  33 yowf active smoker with healthy childhood and last IUP  Age 54 with tendency to sinus infections/ bronchitis starting around 2005 couple times a year occ needed neb that she borrowed from relative but worse since winter  2019 dx with by [facility] with chf some better then in January 19th to [place] then to [facility] then home and says never walked in hallway and maybe 50-60% better but could not get to [place] / nor shop at [employer] and they started smoking and no nebulizer broke    R/L [facility] 03/08/18 EF 40 LVEDP 20 with CO 6lpm   PA mean 25/wedge 15 so PVR = 10/6 = wnl   History of Present Illness  03/30/2018  Pulmonary/ 1st office eval/Graciela Plato  Chief Complaint  Patient presents with  . Consult    Referred by Dr. Hassell Done due to wheezing, coughing, chest tightness and congestion. Pt states when she coughs, she is unable to get any mucus to come up.  Dyspnea:  MMRC3 = can't walk 100 yards even at a slow pace at a flat grade s stopping due to sob   Cough: nothing much now, min mucoid in ams Sleep: maybe 30 degrees due to HB SABA use: 4 x daily, rarely bedtime rec Plan A = Automatic = Symbicort 80 Take 2 puffs first thing in am and then another 2 puffs about 12 hours later.  Work on inhaler technique:   Plan B = Backup Only use your albuterol inhaler    08/31/2018  f/u ov/Krystan Northrop re: GOLD II copd / still smoking  Chief Complaint  Patient presents with  . 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  The key is to stop smoking completely  before smoking completely stops you! Plan A = Automatic = Trelegy one click each am  Plan B = Backup Only use your albuterol inhaler(proair respiclick)  as a rescue medication  augmentin 875 bid x 10 days for nasal congestion "    09/05/18 tried to go back to work sent home p 1.5 h no explanation per pt  09/06/18 and stayed 5.5 h in no A/C and then had cc couldn't breathe and inhaler didn't work and informed supervisor and not offered a fan and texted him to report she had to leave.     09/07/2018  f/u ov/Masey Scheiber re:  GOLD II copd/ still smoking / maint trelegy  Chief Complaint  Patient presents with  . 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   Dyspnea:  Still MMRC =2 but cannot tol heat > severe sweating, coughing, sob, much better inac  Cough: mostly dry  Sleeping: flat  one pillow/ ok p xanax  SABA use: only at work  rec Augmentin 875 mg take one pill twice daily  X 10 days  If sinuses not better call for ENT eval > did not do  Cannot be expected to work in an un-airconditioned environment due to  COPD with an asthmatic component   12/09/2018  f/u ov/Riker Collier re: GOLD II still smoking  Chief Complaint  Patient presents with  . Follow-up    has not been able to work with the restrictions that we gave her from last visit. She states that her breathing has not improved much.  Dyspnea:  MMRC2 = can't walk a nl pace on a flat grade s sob but does fine slow and flat  Cough: feels triggered by pnds/ no better p augmentin rx  / mostly dry  Sleeping: p xanax flat bed one pillow SABA use: up to twice daily  rec Ok to take full dose of spirolactone until swelling better then reduce back to one-half  daily  Blow out trelegy out thru nose  We will to set you up with an ENT doctor    05/24/2019  f/u ov/Rockelle Heuerman re:  GOLD II still smoking / off trelegy due to cost/lost insurance  Chief  Complaint  Patient presents with  . Follow-up    Increased SOB and CP x 3-4 wks. She states has been without any inhalers for the past few months due to no insurance. She was recently prescribed augmentin for sore throat but not started yet.   Dyspnea: works at [employer], slowly up [employer] and a bit worse since off trelegy and no saba either  Cough: assoc with pnds,   esp in am with mucus turning yellow x sev weeks - was given augmentin by gyn but hasn't started  Sleeping: one pillow/ bed is flat ok p xanax  SABA use: none 02: none  New sporadic midline cp x 3-4 weeks no worse with deep breath or walking /  Usually   not present noct,  assoc with hb/gas on nexium otc but not ac  rec Finish augmentin then call me for sinus CT or see [facility] if still having nasal/ sinus compliments  Restart trelegy one click each daily Pantoprazole (protonix) 40 mg   Take  30-60 min before first meal  and Pepcid (famotidine)  20 mg one @  bedtime until return to office  GERD diet  The key is to stop smoking completely before smoking completely stops you! Please schedule a follow up office visit in 4 weeks, sooner if needed  with all medications /inhalers/ solutions in hand so we can verify exactly what you are taking. This includes all medications from all doctors and over the counters   06/26/2019  f/u ov/Copeland Lapier re: new pattern cp/ copd has not taken first covid 19 vaccinations but plans to "soon"  / still smoking Chief Complaint  Patient presents with  . Follow-up    4 f/u for COPD. States her breathing has been stable since last visit. Was pushed down by a dog about 3 weeks ago, still having issues with taking a deep breath. Pain on right side.   Dyspnea:  Better while trelegy but could not afford it on her insurance. Cough: a lot better/  Sleeping: one pillow bed is flat / xanax /melatonin  SABA use: none, ran out  02: none  Fell on back 3 weeks ago and immediately started  feeling numb burning sensation  below L breast / no rash/ bra makes it work. W/u at [facility] neg rec flexoril some better  Prior midline cp resolved  No obvious day to day or  daytime variability or assoc excess/ purulent sputum or mucus plugs or hemoptysis or cp or chest tightness, subjective wheeze or overt sinus or hb symptoms.   Sleeping  without nocturnal  or early am exacerbation  of respiratory  c/o's or need for noct saba. Also denies any obvious fluctuation of symptoms with weather or environmental changes or other aggravating or alleviating factors except as outlined above   No unusual exposure hx or h/o childhood pna/ asthma or knowledge of premature birth.  Current Allergies, Complete Past Medical History, Past Surgical History, Family History, and Social History were reviewed in [employer] record.  ROS  The following are not active complaints unless bolded Hoarseness, sore throat, dysphagia, dental problems, itching, sneezing,  nasal congestion or discharge of excess mucus or purulent secretions, ear ache,   fever, chills, sweats, unintended wt loss or wt gain, classically pleuritic or exertional cp,  orthopnea pnd or arm/hand swelling  or leg swelling, presyncope, palpitations, abdominal pain, anorexia, nausea, vomiting, diarrhea  or change in bowel habits or change in bladder habits, change in stools or change in urine, dysuria, hematuria,  Itchy rash, arthralgias, visual complaints, headache, numbness, weakness or ataxia or problems with walking or coordination,  change in mood or  memory.        Current Meds  Medication Sig  . alprazolam (XANAX) 2 MG tablet Take 2 mg by mouth at bedtime as needed.  . bisoprolol (ZEBETA) 5 MG tablet Take 1 tablet (5 mg total) by mouth daily.  . famotidine (PEPCID) 20 MG tablet One at bedtime  . Fluticasone-Umeclidin-Vilant (TRELEGY ELLIPTA) 100-62.5-25 MCG/INH AEPB Inhale 1 puff into the lungs daily.  Marland Kitchen losartan (COZAAR) 25 MG tablet Take 1 tablet (25 mg total)  by mouth daily.  . Magnesium 500 MG TABS Take 1 tablet by mouth daily.  . Melatonin 3 MG TABS Take 3 mg by mouth at bedtime as needed (sleep).   . pantoprazole (PROTONIX) 40 MG tablet Take 1 tablet (40 mg total) by mouth daily. Take 30-60 min before first meal of the day  . potassium chloride SA (KLOR-CON M20) 20 MEQ tablet Take 1 tablet (20 mEq total) by mouth daily.  . promethazine (PHENERGAN) 25 MG tablet Take 1 tablet (25 mg total) by mouth every 8 (eight) hours as needed for nausea. (Patient taking differently: Take 25 mg by mouth every 8 (eight) hours as needed for nausea. 1/2 tablet as needed)  . spironolactone (ALDACTONE) 25 MG tablet Take one daily until swelling better then one-half  daily  . [DISCONTINUED] Albuterol Sulfate (PROAIR RESPICLICK) 123XX123 (90 Base) MCG/ACT AEPB Inhale 1-2 puffs into the lungs every 4 (four) hours as needed.            Objective:       06/26/2019    05/24/2019          137  12/09/2018      142  09/07/2018        137   08/31/18 135 lb (61.2 kg)  07/26/18 130 lb (59 kg)  04/14/18 130 lb (59 kg)     amb wf nad   Vital signs reviewed  06/26/2019  - Note at rest 02 sats  98% on RA     HEENT : pt wearing mask not removed for exam due to covid - 19 concerns.    NECK :  without JVD/Nodes/TM/ nl carotid upstrokes bilaterally   LUNGS: no acc muscle use,  Mild barrel  contour chest wall  with bilateral  Distant bs s audible wheeze and  without cough on insp or exp maneuvers  and mild  Hyperresonant  to  percussion bilaterally     CV:  RRR  no s3 or murmur or increase in P2, and no edema   ABD:  soft and nontender with pos end  insp Hoover's  in the supine position. No bruits or organomegaly appreciated, bowel sounds nl  MS:   Nl gait/  ext warm without deformities, calf tenderness, cyanosis or clubbing No obvious joint restrictions   SKIN: warm and dry with splotchy rash/ sratch marks R Pos chest /scapular area - not suggestive of shingles (no  vesicles)  and not dermatomal/assoc with  scratch marks   NEURO:  alert, approp, nl sensorium with  no motor or cerebellar deficits apparent.               CXR PA and Lateral:   06/26/2019 :    I personally reviewed images and agree with radiology impression as follows:    Lungs clear.  Cardiac silhouette within normal limits. My add: No effusion or obvious rib fx               Assessment

## 2019-06-26 NOTE — Assessment & Plan Note (Signed)
Counseled re importance of smoking cessation but did not meet time criteria for separate billing    Pt informed of the seriousness of COVID 19 infection as a direct risk to lung health  and safey and to close contacts and should continue to wear a facemask in public and minimize exposure to public locations but especially avoid any area or activity where non-close contacts are not observing distancing or wearing an appropriate face mask.  I strongly recommended vaccine asap thru her pharmacy.          Each maintenance medication was reviewed in detail including emphasizing most importantly the difference between maintenance and prns and under what circumstances the prns are to be triggered using an action plan format where appropriate.  Total time for H and P, chart review, counseling, teaching device and generating customized AVS unique to this office visit / charting = 30 min

## 2019-06-26 NOTE — Assessment & Plan Note (Signed)
Active smoker - 03/30/2018   Walked RA  3  laps @  approx 214ft each @ avg pace  stopped due to end of study, mild sob and chest tight, no desats   - Spirometry 03/30/2018  FEV1 2.4  (94%)  Ratio 0.73 s physiologic curvature p > 4 h since last saba  - 03/30/2018    try trelegy sample to see if reduces need for saba   - 04/14/2018  After extensive coaching inhaler device,  effectiveness =    75% from basline 50% > try symb 80 2bid  - 08/31/2018  After extensive coaching inhaler device,  effectiveness =    90% with dpi  rx trelegy  -  PFT's  08/31/2018  FEV1 1.89 (72 % ) ratio 0.58  p 11 % improvement from saba p nothing prior to study with DLCO  60 % corrects to 53  % for alv volume  - alpha one AT phenotype 05/24/2019   Level 129  MM - - 05/24/2019   Walked RA x two laps =  approx 524ft @ mod pace pace - stopped due to end of study s cp or limiting sob  with sats of 95 % at the end of the studt 06/26/2019  After extensive coaching inhaler device,  effectiveness =    75% hfa > try breztri 2bid as trelegy too high on her insurance   Group D in terms of symptom/risk and laba/lama/ICS  therefore appropriate rx at this point >>>  Trial of breztri and f/u in 4 weeks  I spent extra time with pt today reviewing appropriate use of albuterol for prn use on exertion with the following points: 1) saba is for relief of sob that does not improve by walking a slower pace or resting but rather if the pt does not improve after trying this first. 2) If the pt is convinced, as many are, that saba helps recover from activity faster then it's easy to tell if this is the case by re-challenging : ie stop, take the inhaler, then p 5 minutes try the exact same activity (intensity of workload) that just caused the symptoms and see if they are substantially diminished or not after saba 3) if there is an activity that reproducibly causes the symptoms, try the saba 15 min before the activity on alternate days   If in fact the saba really  does help, then fine to continue to use it prn but advised may need to look closer at the maintenance regimen being used to achieve better control of airways disease with exertion.

## 2019-06-26 NOTE — Patient Instructions (Addendum)
Breztri Take 2 puffs first thing in am and then another 2 puffs about 12 hours later.   Work on inhaler technique:  relax and gently blow all the way out then take a nice smooth deep breath back in, triggering the inhaler at same time you start breathing in.  Hold for up to 5 seconds if you can. Blow out thru nose. Rinse and gargle with water when done   Call if insurance does not cover or not better exertion tolerance   The key is to stop smoking completely before smoking completely stops you!     I spent extra time with pt today reviewing appropriate use of albuterol for prn use on exertion with the following points: 1) saba is for relief of sob that does not improve by walking a slower pace or resting but rather if the pt does not improve after trying this first. 2) If the pt is convinced, as many are, that saba helps recover from activity faster then it's easy to tell if this is the case by re-challenging : ie stop, take the inhaler, then p 5 minutes try the exact same activity (intensity of workload) that just caused the symptoms and see if they are substantially diminished or not after saba 3) if there is an activity that reproducibly causes the symptoms, try the saba 15 min before the activity on alternate days   If in fact the saba really does help, then fine to continue to use it prn but advised may need to look closer at the maintenance regimen being used to achieve better control of airways disease with exertion.   zostrix cream 4 x daily   Please remember to go to the  x-ray department  for your tests - we will call you with the results when they are available    Please schedule a follow up office visit in 4 weeks, sooner if needed  with all medications /inhalers/ solutions in hand so we can verify exactly what you are taking. This includes all medications from all doctors and over the counters

## 2019-06-27 ENCOUNTER — Telehealth: Payer: Self-pay | Admitting: Internal Medicine

## 2019-06-27 NOTE — Telephone Encounter (Signed)
Advised pt of results. Pt understood and nothing further is needed.    Tanda Rockers, MD  06/26/2019 12:49 PM EDT    Call pt: Reviewed cxr and no broke ribs or acute change so no change in recommendations made at [facility]

## 2019-06-27 NOTE — Progress Notes (Signed)
LMTCB x 1 

## 2019-07-05 ENCOUNTER — Other Ambulatory Visit: Payer: Self-pay | Admitting: Physician Assistant

## 2019-07-05 ENCOUNTER — Telehealth: Payer: Self-pay | Admitting: Cardiovascular Disease

## 2019-07-05 NOTE — Telephone Encounter (Signed)
  Pt c/o of Chest Pain: STAT if CP now or developed within 24 hours  1. Are you having CP right now? yes  2. Are you experiencing any other symptoms (ex. SOB, nausea, vomiting, sweating)? SOB when moving around  3. How long have you been experiencing CP? 06/02/2019  4. Is your CP continuous or coming and going? continuous  5. Have you taken Nitroglycerin? no   Patient states she fell 06/02/2019 and since then she has had an unbearable chest pain. She states it is under her breast and is not able to wear normal bras. She states it is continuous and has hjad x rays, but nothing is broken. She states it feels like bones rubbing together and she is concerned it could be heart related. ?

## 2019-07-05 NOTE — Telephone Encounter (Signed)
The patient is calling in complaining of chest pain that has been occurring for over a month now. In mid-April the patient was knocked down from behind by a large dog and it knocked the breath out of her and bruised her torso badly. Pt has been seen by urgent care as well as Dr. Minna Merritts multiple times. She has gotten multiple x rays which do not show any broken bones. Pt reports that the pain is under her breasts, she can not wear normal bras or inhale deeply without severe pain. Pt has COPD and is a current smoker. She states that OTC pain relievers have not worked. She was prescribed "pain pills" from urgent care last month which did help but she is now out of them. Pt denies any lightheadedness, pain that radiates, N/V or sweating. I let the patient know that these symptoms seem to be muscoskeletal rather than cardiac. A previous note from Dr. Melvyn Novas states that bruised ribs can take 4-6 weeks to heal. I advised that patient reach out to her PCP - she states she no longer has a PCP and can not afford to go to urgent care again. I advised her that she can establish care with a PCP or reach back out to Dr. Melvyn Novas who has been following her since the incident.

## 2019-07-06 NOTE — Telephone Encounter (Signed)
She had a completely normal cath last year.  It sounds like these pains are MSK related. She should continue to follow up with her primary md.  She can see [place] if she develops angina like cp. We can see her to make sure that her pains are not angina but we our recommendation may just be to allow time, take motrin/  tylenol.   we do not prescribe narcotic pain pills.

## 2019-07-10 ENCOUNTER — Other Ambulatory Visit: Payer: Self-pay

## 2019-07-10 ENCOUNTER — Ambulatory Visit: Payer: BC Managed Care – PPO | Admitting: Cardiovascular Disease

## 2019-07-10 ENCOUNTER — Encounter: Payer: Self-pay | Admitting: Cardiovascular Disease

## 2019-07-10 VITALS — BP 142/86 | HR 80 | Ht 63.0 in | Wt 141.5 lb

## 2019-07-10 DIAGNOSIS — I10 Essential (primary) hypertension: Secondary | ICD-10-CM | POA: Diagnosis not present

## 2019-07-10 DIAGNOSIS — Z79899 Other long term (current) drug therapy: Secondary | ICD-10-CM | POA: Diagnosis not present

## 2019-07-10 DIAGNOSIS — I428 Other cardiomyopathies: Secondary | ICD-10-CM

## 2019-07-10 DIAGNOSIS — I5042 Chronic combined systolic (congestive) and diastolic (congestive) heart failure: Secondary | ICD-10-CM

## 2019-07-10 MED ORDER — LOSARTAN POTASSIUM 50 MG PO TABS: 50.0000 mg | ORAL_TABLET | Freq: Every day | ORAL | 1 refills | Status: DC

## 2019-07-10 NOTE — Progress Notes (Signed)
Cardiology Office Note:    Date:  07/10/2019   ID:  Mary Franco, DOB 19-Oct-1965, MRN [id number]  PCP:  Patient, No Pcp Per  Cardiologist:  Mertie Moores, MD  Electrophysiologist:  None   Referring MD: No ref. provider found   Chief Complaint  Patient presents with  . Congestive Heart Failure     History of Present Illness:    Mary Franco is a 54 y.o. female with a hx of chronic combined systolic and diastolic congestive heart failure, COPD, hypertension, tobacco abuse.  Still having some chest wall pain from being knocked down by a dog.  Cath in Jan. 2020 showed normal coronaries.  Echo in June, 2020 shows normal LV function.    Past Medical History:  Diagnosis Date  . Alcohol abuse   . Anxiety   . ASD (atrial septal defect) 07/19/2018  . Asthma   . Cervical strain 04/16/2014  . Cervicogenic headache 04/16/2014  . Chronic combined systolic and diastolic CHF (congestive heart failure) ([place]) 03/25/2018   Echo 02/2018: EF 40-45 // Echo 07/2018:  EF 55-60, trivial AI, small secundum ASD (L>R shunting).  . COPD (chronic obstructive pulmonary disease) ([place])   . Fibromyalgia   . Migraines     Past Surgical History:  Procedure Laterality Date  . APPENDECTOMY    . KNEE SURGERY Left   . RIGHT/LEFT HEART CATH AND CORONARY ANGIOGRAPHY N/A 03/08/2018   Procedure: RIGHT/LEFT HEART CATH AND CORONARY ANGIOGRAPHY;  Surgeon: Lorretta Harp, MD;  Location: [facility];  Service: Cardiovascular;  Laterality: N/A;  . TUBAL LIGATION      Current Medications: Current Meds  Medication Sig  . albuterol (PROAIR HFA) 108 (90 Base) MCG/ACT inhaler 2 puffs every 4 hours as needed only  if your can't catch your breath  . alprazolam (XANAX) 2 MG tablet Take 2 mg by mouth at bedtime as needed.  . bisoprolol (ZEBETA) 5 MG tablet Take 1 tablet (5 mg total) by mouth daily.  . Budeson-Glycopyrrol-Formoterol (BREZTRI AEROSPHERE) 160-9-4.8 MCG/ACT AERO Inhale 2 puffs into the lungs 2  (two) times daily.  . famotidine (PEPCID) 20 MG tablet One at bedtime  . ketoconazole (NIZORAL) 2 % shampoo Apply 1 application topically as needed.  . Magnesium 500 MG TABS Take 1 tablet by mouth daily.  . Melatonin 3 MG TABS Take 3 mg by mouth at bedtime as needed (sleep).   . pantoprazole (PROTONIX) 40 MG tablet Take 1 tablet (40 mg total) by mouth daily. Take 30-60 min before first meal of the day  . potassium chloride SA (KLOR-CON M20) 20 MEQ tablet Take 1 tablet (20 mEq total) by mouth daily.  . promethazine (PHENERGAN) 25 MG tablet Take 1 tablet (25 mg total) by mouth every 8 (eight) hours as needed for nausea.  Marland Kitchen spironolactone (ALDACTONE) 25 MG tablet Take 1/2 (one-half) tablet by mouth once daily  . [DISCONTINUED] losartan (COZAAR) 25 MG tablet Take 1 tablet by mouth once daily     Allergies:   Codeine and Sulfa antibiotics   Social History   Socioeconomic History  . Marital status: Divorced    Spouse name: Not on file  . Number of children: 2  . Years of education: Not on file  . Highest education level: Not on file  Occupational History  . Occupation: Textiles  Tobacco Use  . Smoking status: Current Every Day Smoker    Packs/day: 2.00    Years: 40.00    Pack years: 80.00  Types: Cigarettes  . Smokeless tobacco: Never Used  . Tobacco comment: as of 06/26/2019 down to 6 cigs/day  Substance and Sexual Activity  . Alcohol use: Yes    Comment: 2 x per week  . Drug use: No  . Sexual activity: Not on file  Other Topics Concern  . Not on file  Social History Narrative   ** Merged History Encounter **   Patient is right handed.   Patient drinks 3 cups caffeine daily.   Working on 2nd shift now at her job.  08-15-14          Social Determinants of Radio broadcast assistant Strain:   . Difficulty of Paying Living Expenses:   Food Insecurity:   . Worried About Charity fundraiser in the Last Year:   . Arboriculturist in the Last Year:   Transportation Needs:    . Film/video editor (Medical):   Marland Kitchen Lack of Transportation (Non-Medical):   Physical Activity:   . Days of Exercise per Week:   . Minutes of Exercise per Session:   Stress:   . Feeling of Stress :   Social Connections:   . Frequency of Communication with Friends and Family:   . Frequency of Social Gatherings with Friends and Family:   . Attends Religious Services:   . Active Member of Clubs or Organizations:   . Attends Archivist Meetings:   Marland Kitchen Marital Status:      Family History: The patient's family history includes Cancer in her mother; Colitis in her paternal aunt; Ovarian cancer in her maternal grandmother.  ROS:   Please see the history of present illness.     All other systems reviewed and are negative.  EKGs/Labs/Other Studies Reviewed:    The following studies were reviewed today:   Recent Labs: 05/24/2019: BUN 13; Creatinine, Ser 0.79; Hemoglobin 12.8; Platelets 366.0; Potassium 3.4; Sodium 137  Recent Lipid Panel No results found for: CHOL, TRIG, HDL, CHOLHDL, VLDL, LDLCALC, LDLDIRECT  Physical Exam:    VS:  BP (!) 142/86   Pulse 80   Ht 5\' 3"  (1.6 m)   Wt 141 lb 8 oz (64.2 kg)   SpO2 96%   BMI 25.07 kg/m     Wt Readings from Last 3 Encounters:  07/10/19 141 lb 8 oz (64.2 kg)  06/26/19 142 lb 3.2 oz (64.5 kg)  05/24/19 137 lb 6.4 oz (62.3 kg)     GEN:  Well nourished, well developed in no acute distress HEENT: Normal NECK: No JVD; No carotid bruits LYMPHATICS: No lymphadenopathy CARDIAC: RRR, no murmurs, rubs, gallops RESPIRATORY:  Clear to auscultation without rales, wheezing or rhonchi  ABDOMEN: Soft, non-tender, non-distended MUSCULOSKELETAL:  No edema; No deformity  SKIN: Warm and dry NEUROLOGIC:  Alert and oriented x 3 PSYCHIATRIC:  Normal affect   ECG: Jul 10, 2019: Normal sinus rhythm at 82.  No ST or T wave changes.  She has no changes from previous EKG.  ASSESSMENT:    1. Medication management   2. Essential  hypertension   3. Chronic combined systolic and diastolic CHF (congestive heart failure) (HCC)    PLAN:    In order of problems listed above:  1. Hx of CHF:   EF has normalized.  1 more increase losartan to 50 mg a day.  Continue bisoprolol and spironolactone. Check a basic metabolic profile in 3 weeks.  2.  History of smoking: Recommend that she stop smoking.  3.  Hypertension: Increase losartan to 50 mg a day.    Medication Adjustments/Labs and Tests Ordered: Current medicines are reviewed at length with the patient today.  Concerns regarding medicines are outlined above.  Orders Placed This Encounter  Procedures  . Basic metabolic panel  . EKG 12-Lead   Meds ordered this encounter  Medications  . losartan (COZAAR) 50 MG tablet    Sig: Take 1 tablet (50 mg total) by mouth daily.    Dispense:  90 tablet    Refill:  1    DOSE CHANGE-INCREASED    Patient Instructions  Medication Instructions:   INCREASE YOUR LOSARTAN TO 50 MG BY MOUTH DAILY  *If you need a refill on your cardiac medications before your next appointment, please call your pharmacy*   Lab Work:  [place] OFFICE--WILL CHECK BMET AT [facility]  If you have labs (blood work) drawn today and your tests are completely normal, you will receive your results only by: Marland Kitchen MyChart Message (if you have MyChart) OR . A paper copy in the mail If you have any lab test that is abnormal or we need to change your treatment, we will call you to review the results.   Follow-Up: At [facility], you and your health needs are our priority.  As part of our continuing mission to provide you with exceptional heart care, we have created designated Provider Care Teams.  These Care Teams include your primary Cardiologist (physician) and Advanced Practice Providers (APPs -  Physician Assistants and Nurse Practitioners) who all work together to provide you with the care you need, when you need it.  We recommend  signing up for the patient portal called "MyChart".  Sign up information is provided on this After Visit Summary.  MyChart is used to connect with patients for Virtual Visits (Telemedicine).  Patients are able to view lab/test results, encounter notes, upcoming appointments, etc.  Non-urgent messages can be sent to your provider as well.   To learn more about what you can do with MyChart, go to [email or web address].    Your next appointment:   6 month(s)  The format for your next appointment:   In Person  Provider:   Richardson Dopp, PA-C         Signed, Mertie Moores, MD  07/10/2019 10:22 AM    [place]

## 2019-07-10 NOTE — Patient Instructions (Signed)
Medication Instructions:   INCREASE YOUR LOSARTAN TO 50 MG BY MOUTH DAILY  *If you need a refill on your cardiac medications before your next appointment, please call your pharmacy*   Lab Work:  [place] OFFICE--WILL [place]  If you have labs (blood work) drawn today and your tests are completely normal, you will receive your results only by: Marland Kitchen MyChart Message (if you have MyChart) OR . A paper copy in the mail If you have any lab test that is abnormal or we need to change your treatment, we will call you to review the results.   Follow-Up: At [facility], you and your health needs are our priority.  As part of our continuing mission to provide you with exceptional heart care, we have created designated Provider Care Teams.  These Care Teams include your primary Cardiologist (physician) and Advanced Practice Providers (APPs -  Physician Assistants and Nurse Practitioners) who all work together to provide you with the care you need, when you need it.  We recommend signing up for the patient portal called "MyChart".  Sign up information is provided on this After Visit Summary.  MyChart is used to connect with patients for Virtual Visits (Telemedicine).  Patients are able to view lab/test results, encounter notes, upcoming appointments, etc.  Non-urgent messages can be sent to your provider as well.   To learn more about what you can do with MyChart, go to [email or web address].    Your next appointment:   6 month(s)  The format for your next appointment:   In Person  Provider:   Richardson Dopp, PA-C

## 2019-08-01 ENCOUNTER — Other Ambulatory Visit: Payer: BC Managed Care – PPO

## 2019-08-08 ENCOUNTER — Ambulatory Visit: Payer: BC Managed Care – PPO | Admitting: Internal Medicine

## 2019-10-03 ENCOUNTER — Ambulatory Visit: Payer: BC Managed Care – PPO | Admitting: Internal Medicine

## 2019-11-17 ENCOUNTER — Ambulatory Visit: Payer: BC Managed Care – PPO | Admitting: Internal Medicine

## 2019-11-27 ENCOUNTER — Ambulatory Visit: Payer: BC Managed Care – PPO | Admitting: Internal Medicine

## 2019-12-11 DIAGNOSIS — R87610 Atypical squamous cells of undetermined significance on cytologic smear of cervix (ASC-US): Secondary | ICD-10-CM

## 2019-12-11 HISTORY — DX: Atypical squamous cells of undetermined significance on cytologic smear of cervix (ASC-US): R87.610

## 2019-12-12 DIAGNOSIS — R87610 Atypical squamous cells of undetermined significance on cytologic smear of cervix (ASC-US): Secondary | ICD-10-CM | POA: Insufficient documentation

## 2019-12-12 HISTORY — DX: Atypical squamous cells of undetermined significance on cytologic smear of cervix (ASC-US): R87.610

## 2020-05-31 IMAGING — CR DG CHEST 2V
2 series · 2 of 2 positions shown · non-contrast
Comparison: 04/14/2018.

CLINICAL DATA: Dry cough. Central chest pains. Smoker.

EXAM:
CHEST - 2 VIEW

[chest pa]
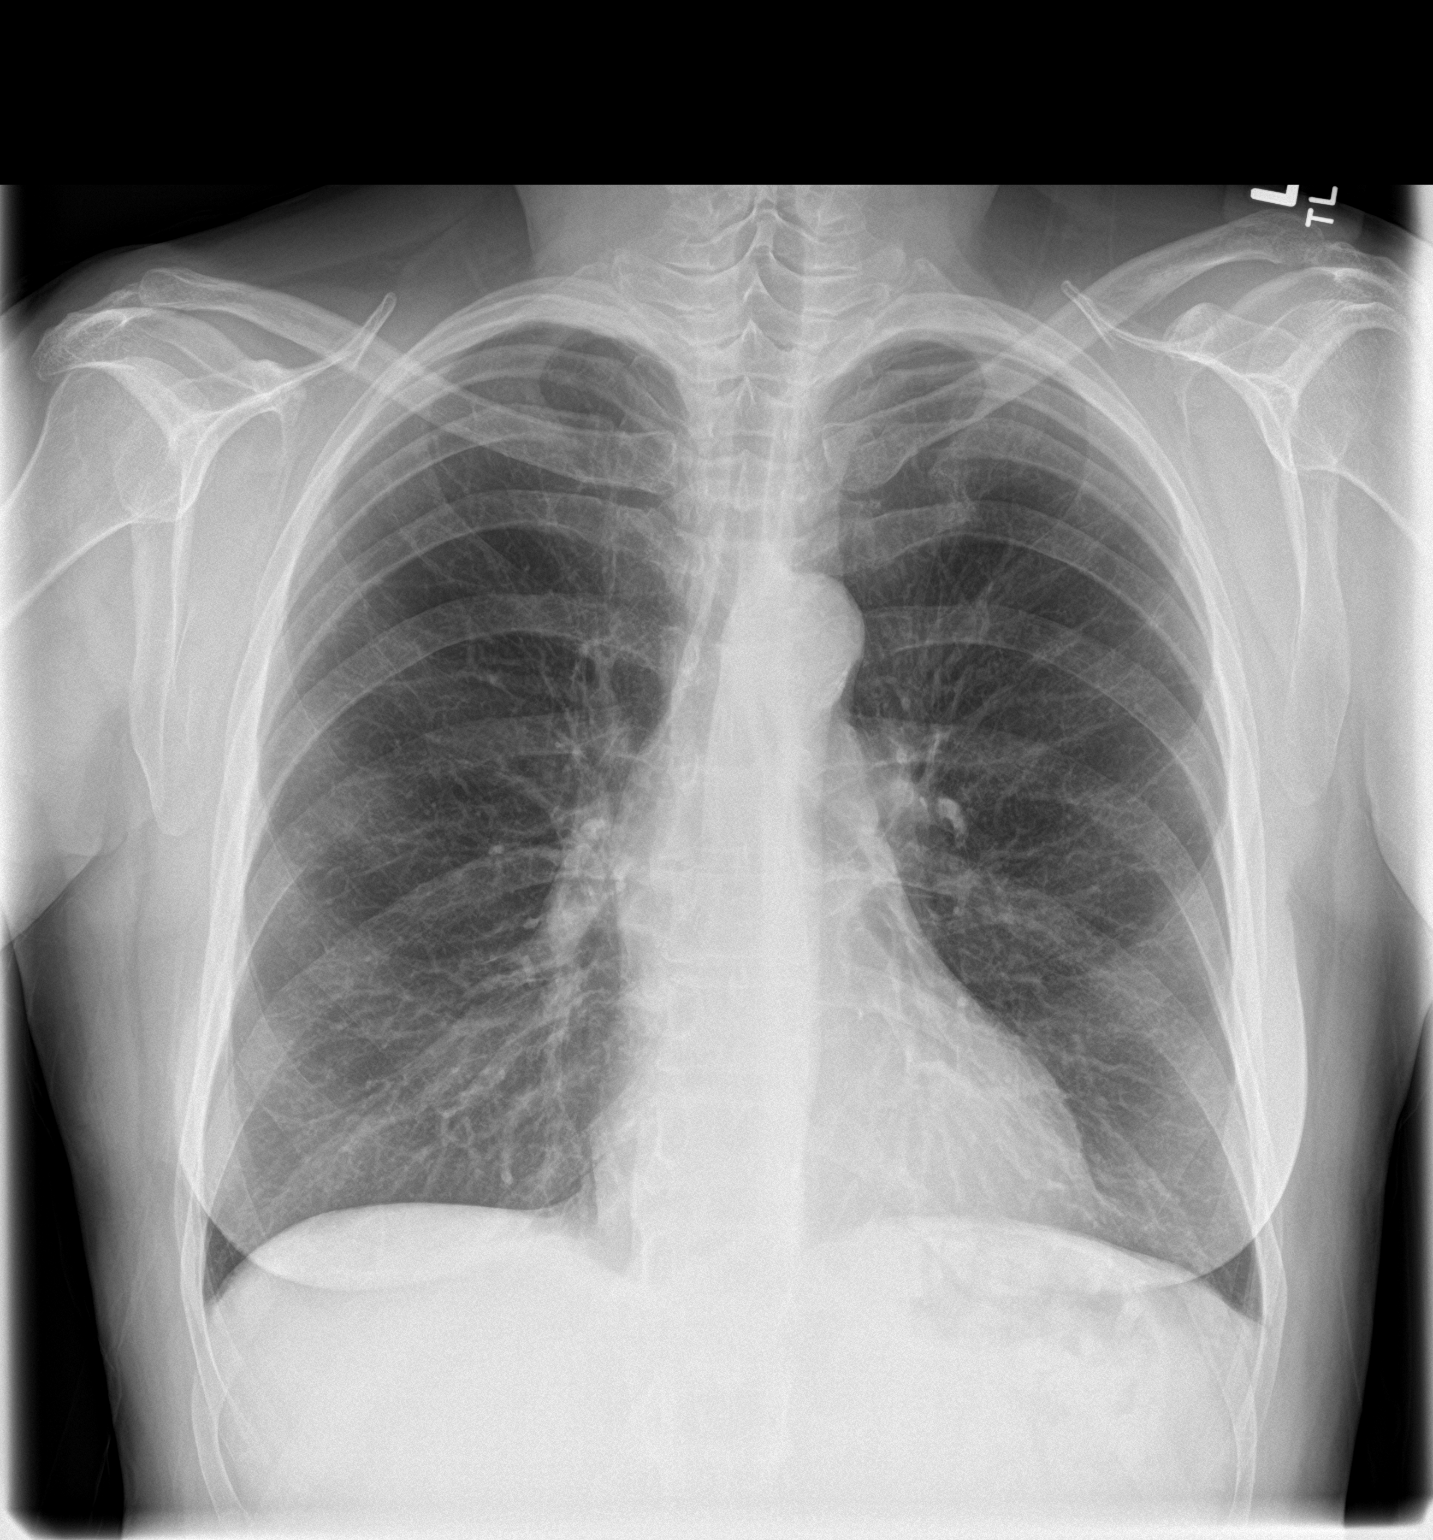

[chest lat]
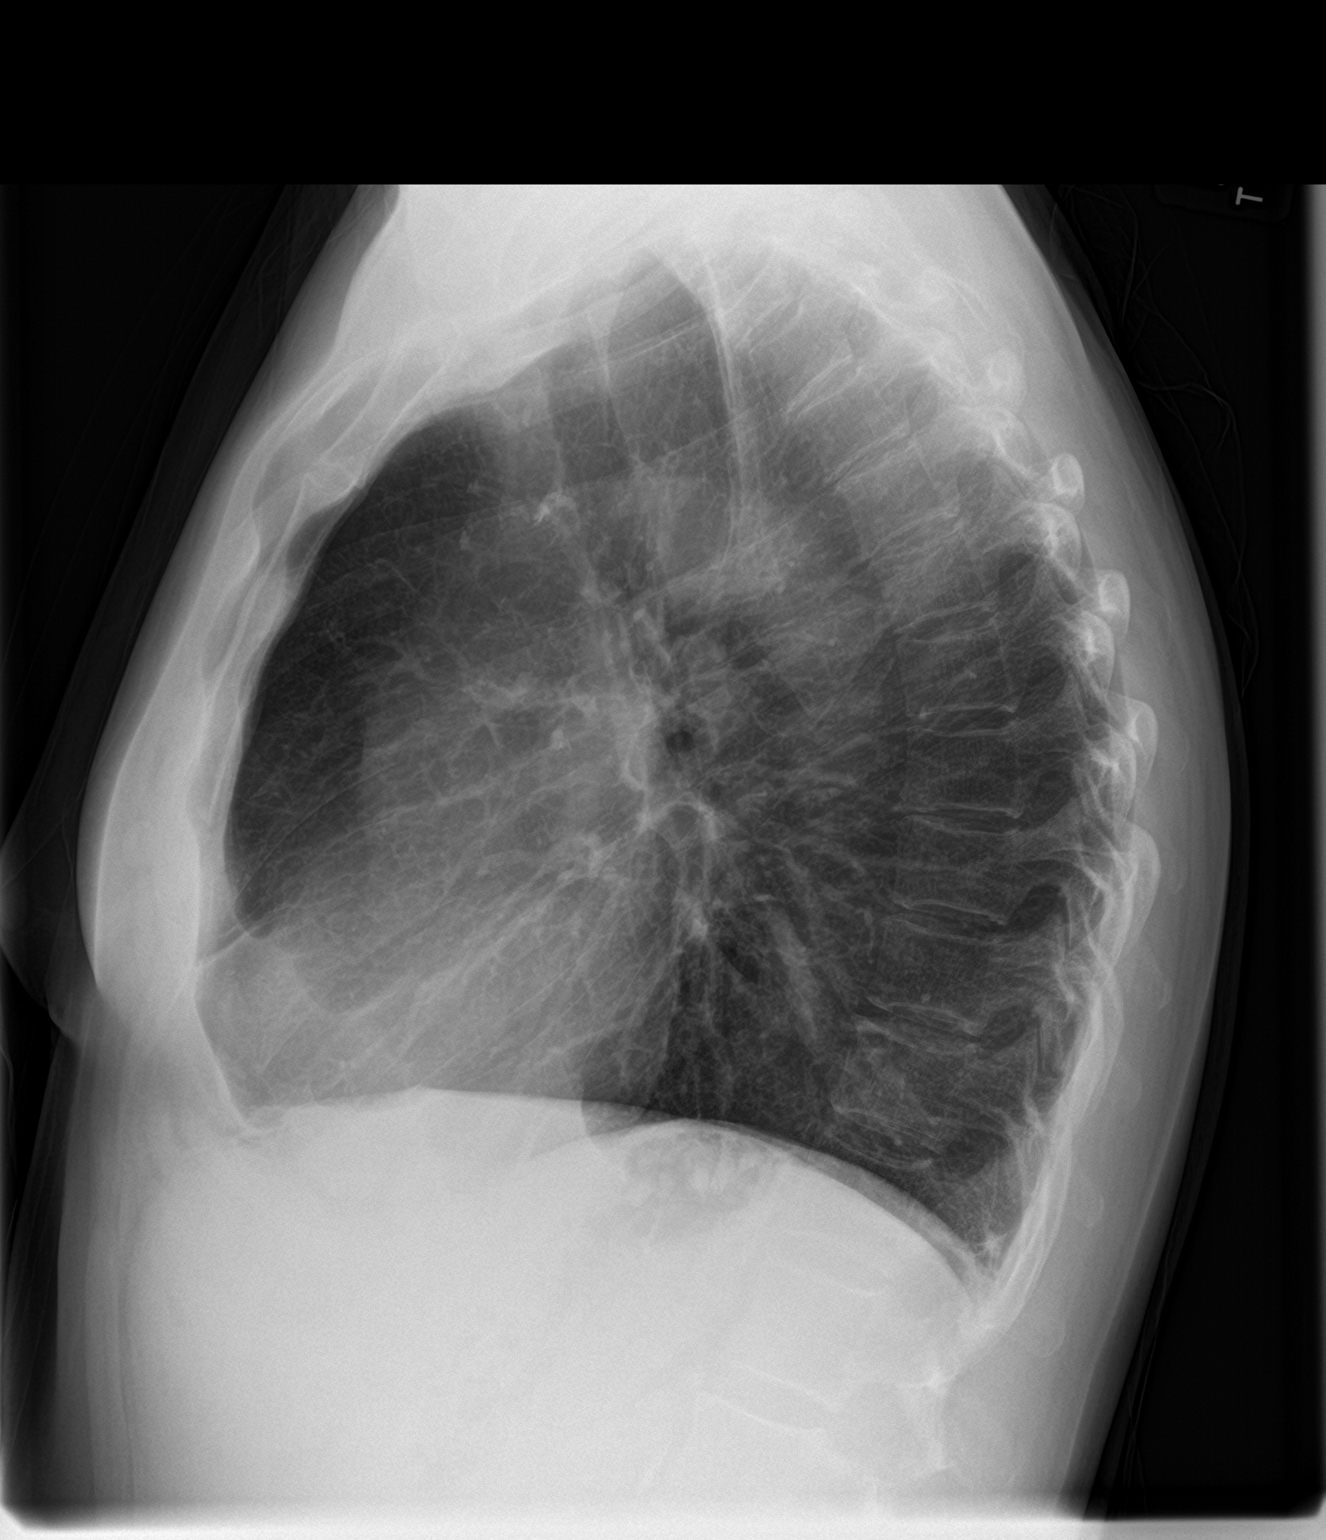

[2 of 2 positions shown; findings below may reference images not displayed]

FINDINGS: Normal sized heart. Stable hyperexpansion of the lungs and mild
peribronchial thickening. Unremarkable bones.
IMPRESSION: No acute disease. Stable changes of COPD and chronic bronchitis.

## 2020-07-10 ENCOUNTER — Other Ambulatory Visit: Payer: Self-pay

## 2020-07-10 ENCOUNTER — Ambulatory Visit (INDEPENDENT_AMBULATORY_CARE_PROVIDER_SITE_OTHER): Payer: 59 | Admitting: Nurse Practitioner

## 2020-07-10 ENCOUNTER — Encounter: Payer: Self-pay | Admitting: Nurse Practitioner

## 2020-07-10 VITALS — BP 140/82 | HR 91 | Temp 97.5°F | Ht 63.0 in | Wt 148.0 lb

## 2020-07-10 DIAGNOSIS — I428 Other cardiomyopathies: Secondary | ICD-10-CM

## 2020-07-10 DIAGNOSIS — R0609 Other forms of dyspnea: Secondary | ICD-10-CM

## 2020-07-10 DIAGNOSIS — I10 Essential (primary) hypertension: Secondary | ICD-10-CM

## 2020-07-10 DIAGNOSIS — Z8719 Personal history of other diseases of the digestive system: Secondary | ICD-10-CM

## 2020-07-10 DIAGNOSIS — R0600 Dyspnea, unspecified: Secondary | ICD-10-CM | POA: Diagnosis not present

## 2020-07-10 DIAGNOSIS — Z7689 Persons encountering health services in other specified circumstances: Secondary | ICD-10-CM

## 2020-07-10 DIAGNOSIS — K219 Gastro-esophageal reflux disease without esophagitis: Secondary | ICD-10-CM

## 2020-07-10 DIAGNOSIS — F17210 Nicotine dependence, cigarettes, uncomplicated: Secondary | ICD-10-CM

## 2020-07-10 DIAGNOSIS — Z6826 Body mass index (BMI) 26.0-26.9, adult: Secondary | ICD-10-CM

## 2020-07-10 DIAGNOSIS — J449 Chronic obstructive pulmonary disease, unspecified: Secondary | ICD-10-CM

## 2020-07-10 DIAGNOSIS — F17200 Nicotine dependence, unspecified, uncomplicated: Secondary | ICD-10-CM

## 2020-07-10 MED ORDER — ALBUTEROL SULFATE HFA 108 (90 BASE) MCG/ACT IN AERS: 2.0000 | INHALATION_SPRAY | Freq: Four times a day (QID) | RESPIRATORY_TRACT | 0 refills | Status: DC | PRN

## 2020-07-10 NOTE — Patient Instructions (Addendum)
Return tomorrow for fasting labs Continue medications Notify office of time for mammogram June 27th, 2022 after work schedule Recommend quitting smoking Recommend routine eye and dental exam Follow-up 65-months,fasting   [email or web address]">  DASH Eating Plan DASH stands for Dietary Approaches to Stop Hypertension. The DASH eating plan is a healthy eating plan that has been shown to:  Reduce high blood pressure (hypertension).  Reduce your risk for type 2 diabetes, heart disease, and stroke.  Help with weight loss. What are tips for following this plan? Reading food labels  Check food labels for the amount of salt (sodium) per serving. Choose foods with less than 5 percent of the Daily Value of sodium. Generally, foods with less than 300 milligrams (mg) of sodium per serving fit into this eating plan.  To find whole grains, look for the word "whole" as the first word in the ingredient list. Shopping  Buy products labeled as "low-sodium" or "no salt added."  Buy fresh foods. Avoid canned foods and pre-made or frozen meals. Cooking  Avoid adding salt when cooking. Use salt-free seasonings or herbs instead of table salt or sea salt. Check with your health care provider or pharmacist before using salt substitutes.  Do not fry foods. Cook foods using healthy methods such as baking, boiling, grilling, roasting, and broiling instead.  Cook with heart-healthy oils, such as olive, canola, avocado, soybean, or sunflower oil. Meal planning  Eat a balanced diet that includes: ? 4 or more servings of fruits and 4 or more servings of vegetables each day. Try to fill one-half of your plate with fruits and vegetables. ? 6-8 servings of whole grains each day. ? Less than 6 oz (170 g) of lean meat, poultry, or fish each day. A 3-oz (85-g) serving of meat is about the same size as a deck of cards. One egg equals 1 oz (28 g). ? 2-3 servings of low-fat  dairy each day. One serving is 1 cup (237 mL). ? 1 serving of nuts, seeds, or beans 5 times each week. ? 2-3 servings of heart-healthy fats. Healthy fats called omega-3 fatty acids are found in foods such as walnuts, flaxseeds, fortified milks, and eggs. These fats are also found in cold-water fish, such as sardines, salmon, and mackerel.  Limit how much you eat of: ? Canned or prepackaged foods. ? Food that is high in trans fat, such as some fried foods. ? Food that is high in saturated fat, such as fatty meat. ? Desserts and other sweets, sugary drinks, and other foods with added sugar. ? Full-fat dairy products.  Do not salt foods before eating.  Do not eat more than 4 egg yolks a week.  Try to eat at least 2 vegetarian meals a week.  Eat more home-cooked food and less restaurant, buffet, and fast food.   Lifestyle  When eating at a restaurant, ask that your food be prepared with less salt or no salt, if possible.  If you drink alcohol: ? Limit how much you use to:  0-1 drink a day for women who are not pregnant.  0-2 drinks a day for men. ? Be aware of how much alcohol is in your drink. In the [place] one drink equals one 12 oz bottle of beer (355 mL), one 5 oz glass of wine (148 mL), or one 1 oz glass of hard liquor (44 mL). General information  Avoid eating more than 2,300 mg of salt a day. If you have hypertension, you may need to reduce  your sodium intake to 1,500 mg a day.  Work with your health care provider to maintain a healthy body weight or to lose weight. Ask what an ideal weight is for you.  Get at least 30 minutes of exercise that causes your heart to beat faster (aerobic exercise) most days of the week. Activities may include walking, swimming, or biking.  Work with your health care provider or dietitian to adjust your eating plan to your individual calorie needs. What foods should I eat? Fruits All fresh, dried, or frozen fruit. Canned fruit in natural juice  (without added sugar). Vegetables Fresh or frozen vegetables (raw, steamed, roasted, or grilled). Low-sodium or reduced-sodium tomato and vegetable juice. Low-sodium or reduced-sodium tomato sauce and tomato paste. Low-sodium or reduced-sodium canned vegetables. Grains Whole-grain or whole-wheat bread. Whole-grain or whole-wheat pasta. Brown rice. Mary Franco. Bulgur. Whole-grain and low-sodium cereals. Pita bread. Low-fat, low-sodium crackers. Whole-wheat flour tortillas. Meats and other proteins Skinless chicken or [place]. Ground chicken or [place]. Pork with fat trimmed off. Fish and seafood. Egg whites. Dried beans, peas, or lentils. Unsalted nuts, nut butters, and seeds. Unsalted canned beans. Lean cuts of beef with fat trimmed off. Low-sodium, lean precooked or cured meat, such as sausages or meat loaves. Dairy Low-fat (1%) or fat-free (skim) milk. Reduced-fat, low-fat, or fat-free cheeses. Nonfat, low-sodium ricotta or cottage cheese. Low-fat or nonfat yogurt. Low-fat, low-sodium cheese. Fats and oils Soft margarine without trans fats. Vegetable oil. Reduced-fat, low-fat, or light mayonnaise and salad dressings (reduced-sodium). Canola, safflower, olive, avocado, soybean, and sunflower oils. Avocado. Seasonings and condiments Herbs. Spices. Seasoning mixes without salt. Other foods Unsalted popcorn and pretzels. Fat-free sweets. The items listed above may not be a complete list of foods and beverages you can eat. Contact a dietitian for more information. What foods should I avoid? Fruits Canned fruit in a light or heavy syrup. Fried fruit. Fruit in cream or butter sauce. Vegetables Creamed or fried vegetables. Vegetables in a cheese sauce. Regular canned vegetables (not low-sodium or reduced-sodium). Regular canned tomato sauce and paste (not low-sodium or reduced-sodium). Regular tomato and vegetable juice (not low-sodium or reduced-sodium). Mary Franco. Olives. Grains Baked goods made  with fat, such as croissants, muffins, or some breads. Dry pasta or rice meal packs. Meats and other proteins Fatty cuts of meat. Ribs. Fried meat. Mary Franco. Bologna, salami, and other precooked or cured meats, such as sausages or meat loaves. Fat from the back of a pig (fatback). Bratwurst. Salted nuts and seeds. Canned beans with added salt. Canned or smoked fish. Whole eggs or egg yolks. Chicken or [place] with skin. Dairy Whole or 2% milk, cream, and half-and-half. Whole or full-fat cream cheese. Whole-fat or sweetened yogurt. Full-fat cheese. Nondairy creamers. Whipped toppings. Processed cheese and cheese spreads. Fats and oils Butter. Stick margarine. Lard. Shortening. Ghee. Bacon fat. Tropical oils, such as coconut, palm kernel, or palm oil. Seasonings and condiments Onion salt, garlic salt, seasoned salt, table salt, and sea salt. Worcestershire sauce. Tartar sauce. Barbecue sauce. Teriyaki sauce. Soy sauce, including reduced-sodium. Steak sauce. Canned and packaged gravies. Fish sauce. Oyster sauce. Cocktail sauce. Store-bought horseradish. Ketchup. Mustard. Meat flavorings and tenderizers. Bouillon cubes. Hot sauces. Pre-made or packaged marinades. Pre-made or packaged taco seasonings. Relishes. Regular salad dressings. Other foods Salted popcorn and pretzels. The items listed above may not be a complete list of foods and beverages you should avoid. Contact a dietitian for more information. Where to find more information  National Heart, Lung, and Blood Institute: https://wilson-eaton.com/  American Heart Association: www.heart.org  Academy of Nutrition and Dietetics: www.eatright.[place]: www.kidney.org Summary  The DASH eating plan is a healthy eating plan that has been shown to reduce high blood pressure (hypertension). It may also reduce your risk for type 2 diabetes, heart disease, and stroke.  When on the DASH eating plan, aim to eat more fresh fruits and  vegetables, whole grains, lean proteins, low-fat dairy, and heart-healthy fats.  With the DASH eating plan, you should limit salt (sodium) intake to 2,300 mg a day. If you have hypertension, you may need to reduce your sodium intake to 1,500 mg a day.  Work with your health care provider or dietitian to adjust your eating plan to your individual calorie needs. This information is not intended to replace advice given to you by your health care provider. Make sure you discuss any questions you have with your health care provider. Document Revised: 01/06/2019 Document Reviewed: 01/06/2019 Elsevier Patient Education  2021 [place] of Quitting Smoking Quitting smoking is a physical and mental challenge. You will face cravings, withdrawal symptoms, and temptation. Before quitting, work with your health care provider to make a plan that can help you manage quitting. Preparation can help you quit and keep you from giving in. How to manage lifestyle changes Managing stress Stress can make you want to smoke, and wanting to smoke may cause stress. It is important to find ways to manage your stress. You might try some of the following:  Practice relaxation techniques. ? Breathe slowly and deeply, in through your nose and out through your mouth. ? Listen to music. ? Soak in a bath or take a shower. ? Imagine a peaceful place or vacation.  Get some support. ? Talk with family or friends about your stress. ? Join a support group. ? Talk with a counselor or therapist.  Get some physical activity. ? Go for a walk, run, or bike ride. ? Play a favorite sport. ? Practice yoga.   Medicines Talk with your health care provider about medicines that might help you deal with cravings and make quitting easier for you. Relationships Social situations can be difficult when you are quitting smoking. To manage this, you can:  Avoid parties and other social situations where people  might be smoking.  Avoid alcohol.  Leave right away if you have the urge to smoke.  Explain to your family and friends that you are quitting smoking. Ask for support and let them know you might be a bit grumpy.  Plan activities where smoking is not an option. General instructions Be aware that many people gain weight after they quit smoking. However, not everyone does. To keep from gaining weight, have a plan in place before you quit and stick to the plan after you quit. Your plan should include:  Having healthy snacks. When you have a craving, it may help to: ? Eat popcorn, carrots, celery, or other cut vegetables. ? Chew sugar-free gum.  Changing how you eat. ? Eat small portion sizes at meals. ? Eat 4-6 small meals throughout the day instead of 1-2 large meals a day. ? Be mindful when you eat. Do not watch television or do other things that might distract you as you eat.  Exercising regularly. ? Make time to exercise each day. If you do not have time for a long workout, do short bouts of exercise for 5-10 minutes several times a day. ? Do some form of strengthening  exercise, such as weight lifting. ? Do some exercise that gets your heart beating and causes you to breathe deeply, such as walking fast, running, swimming, or biking. This is very important.  Drinking plenty of water or other low-calorie or no-calorie drinks. Drink 6-8 glasses of water daily.   How to recognize withdrawal symptoms Your body and mind may experience discomfort as you try to get used to not having nicotine in your system. These effects are called withdrawal symptoms. They may include:  Feeling hungrier than normal.  Having trouble concentrating.  Feeling irritable or restless.  Having trouble sleeping.  Feeling depressed.  Craving a cigarette. To manage withdrawal symptoms:  Avoid places, people, and activities that trigger your cravings.  Remember why you want to quit.  Get plenty of  sleep.  Avoid coffee and other caffeinated drinks. These may worsen some of your symptoms. These symptoms may surprise you. But be assured that they are normal to have when quitting smoking. How to manage cravings Come up with a plan for how to deal with your cravings. The plan should include the following:  A definition of the specific situation you want to deal with.  An alternative action you will take.  A clear idea for how this action will help.  The name of someone who might help you with this. Cravings usually last for 5-10 minutes. Consider taking the following actions to help you with your plan to deal with cravings:  Keep your mouth busy. ? Chew sugar-free gum. ? Suck on hard candies or a straw. ? Brush your teeth.  Keep your hands and body busy. ? Change to a different activity right away. ? Squeeze or play with a ball. ? Do an activity or a hobby, such as making bead jewelry, practicing needlepoint, or working with wood. ? Mix up your normal routine. ? Take a short exercise break. Go for a quick walk or run up and down stairs.  Focus on doing something kind or helpful for someone else.  Call a friend or family member to talk during a craving.  Join a support group.  Contact a quitline. Where to find support To get help or find a support group:  Call the [facility]'s Smoking Quitline: 1-800-QUIT NOW (367) 426-8390)  Visit the website of the Substance Abuse and [place]: ktimeonline.com  Text QUIT to SmokefreeTXT: 858850 Where to find more information Visit these websites to find more information on quitting smoking:  [place]: www.smokefree.gov  American Lung Association: www.lung.org  American Cancer Society: www.cancer.org  Centers for Disease Control and Prevention: http://www.wolf.info/  American Heart Association: www.heart.org Contact a health care provider if:  You want to change your plan for  quitting.  The medicines you are taking are not helping.  Your eating feels out of control or you cannot sleep. Get help right away if:  You feel depressed or become very anxious. Summary  Quitting smoking is a physical and mental challenge. You will face cravings, withdrawal symptoms, and temptation to smoke again. Preparation can help you as you go through these challenges.  Try different techniques to manage stress, handle social situations, and prevent weight gain.  You can deal with cravings by keeping your mouth busy (such as by chewing gum), keeping your hands and body busy, calling family or friends, or contacting a quitline for people who want to quit smoking.  You can deal with withdrawal symptoms by avoiding places where people smoke, getting plenty of  rest, and avoiding drinks with caffeine. This information is not intended to replace advice given to you by your health care provider. Make sure you discuss any questions you have with your health care provider. Document Revised: 11/22/2018 Document Reviewed: 11/22/2018 Elsevier Patient Education  2021 [place] Your Hypertension Hypertension, also called high blood pressure, is when the force of the blood pressing against the walls of the arteries is too strong. Arteries are blood vessels that carry blood from your heart throughout your body. Hypertension forces the heart to work harder to pump blood and may cause the arteries to become narrow or stiff. Understanding blood pressure readings Your personal target blood pressure may vary depending on your medical conditions, your age, and other factors. A blood pressure reading includes a higher number over a lower number. Ideally, your blood pressure should be below 120/80. You should know that:  The first, or top, number is called the systolic pressure. It is a measure of the pressure in your arteries as your heart beats.  The second, or bottom number, is called  the diastolic pressure. It is a measure of the pressure in your arteries as the heart relaxes. Blood pressure is classified into four stages. Based on your blood pressure reading, your health care provider may use the following stages to determine what type of treatment you need, if any. Systolic pressure and diastolic pressure are measured in a unit called mmHg. Normal  Systolic pressure: below 341.  Diastolic pressure: below 80. Elevated  Systolic pressure: 937-902.  Diastolic pressure: below 80. Hypertension stage 1  Systolic pressure: 409-735.  Diastolic pressure: 32-99. Hypertension stage 2  Systolic pressure: 242 or above.  Diastolic pressure: 90 or above. How can this condition affect me? Managing your hypertension is an important responsibility. Over time, hypertension can damage the arteries and decrease blood flow to important parts of the body, including the brain, heart, and kidneys. Having untreated or uncontrolled hypertension can lead to:  A heart attack.  A stroke.  A weakened blood vessel (aneurysm).  Heart failure.  Kidney damage.  Eye damage.  Metabolic syndrome.  Memory and concentration problems.  Vascular dementia. What actions can I take to manage this condition? Hypertension can be managed by making lifestyle changes and possibly by taking medicines. Your health care provider will help you make a plan to bring your blood pressure within a normal range. Nutrition  Eat a diet that is high in fiber and potassium, and low in salt (sodium), added sugar, and fat. An example eating plan is called the Dietary Approaches to Stop Hypertension (DASH) diet. To eat this way: ? Eat plenty of fresh fruits and vegetables. Try to fill one-half of your plate at each meal with fruits and vegetables. ? Eat whole grains, such as whole-wheat pasta, brown rice, or whole-grain bread. Fill about one-fourth of your plate with whole grains. ? Eat low-fat dairy  products. ? Avoid fatty cuts of meat, processed or cured meats, and poultry with skin. Fill about one-fourth of your plate with lean proteins such as fish, chicken without skin, beans, eggs, and tofu. ? Avoid pre-made and processed foods. These tend to be higher in sodium, added sugar, and fat.  Reduce your daily sodium intake. Most people with hypertension should eat less than 1,500 mg of sodium a day.   Lifestyle  Work with your health care provider to maintain a healthy body weight or to lose weight. Ask what an ideal weight is for  you.  Get at least 30 minutes of exercise that causes your heart to beat faster (aerobic exercise) most days of the week. Activities may include walking, swimming, or biking.  Include exercise to strengthen your muscles (resistance exercise), such as weight lifting, as part of your weekly exercise routine. Try to do these types of exercises for 30 minutes at least 3 days a week.  Do not use any products that contain nicotine or tobacco, such as cigarettes, e-cigarettes, and chewing tobacco. If you need help quitting, ask your health care provider.  Control any long-term (chronic) conditions you have, such as high cholesterol or diabetes.  Identify your sources of stress and find ways to manage stress. This may include meditation, deep breathing, or making time for fun activities.   Alcohol use  Do not drink alcohol if: ? Your health care provider tells you not to drink. ? You are pregnant, may be pregnant, or are planning to become pregnant.  If you drink alcohol: ? Limit how much you use to:  0-1 drink a day for women.  0-2 drinks a day for men. ? Be aware of how much alcohol is in your drink. In the [place] one drink equals one 12 oz bottle of beer (355 mL), one 5 oz glass of wine (148 mL), or one 1 oz glass of hard liquor (44 mL). Medicines Your health care provider may prescribe medicine if lifestyle changes are not enough to get your blood pressure  under control and if:  Your systolic blood pressure is 130 or higher.  Your diastolic blood pressure is 80 or higher. Take medicines only as told by your health care provider. Follow the directions carefully. Blood pressure medicines must be taken as told by your health care provider. The medicine does not work as well when you skip doses. Skipping doses also puts you at risk for problems. Monitoring Before you monitor your blood pressure:  Do not smoke, drink caffeinated beverages, or exercise within 30 minutes before taking a measurement.  Use the bathroom and empty your bladder (urinate).  Sit quietly for at least 5 minutes before taking measurements. Monitor your blood pressure at home as told by your health care provider. To do this:  Sit with your back straight and supported.  Place your feet flat on the floor. Do not cross your legs.  Support your arm on a flat surface, such as a table. Make sure your upper arm is at heart level.  Each time you measure, take two or three readings one minute apart and record the results. You may also need to have your blood pressure checked regularly by your health care provider.   General information  Talk with your health care provider about your diet, exercise habits, and other lifestyle factors that may be contributing to hypertension.  Review all the medicines you take with your health care provider because there may be side effects or interactions.  Keep all visits as told by your health care provider. Your health care provider can help you create and adjust your plan for managing your high blood pressure. Where to find more information  National Heart, Lung, and Blood Institute: https://wilson-eaton.com/  American Heart Association: www.heart.org Contact a health care provider if:  You think you are having a reaction to medicines you have taken.  You have repeated (recurrent) headaches.  You feel dizzy.  You have swelling in your  ankles.  You have trouble with your vision. Get help right away if:  You develop a severe headache or confusion.  You have unusual weakness or numbness, or you feel faint.  You have severe pain in your chest or abdomen.  You vomit repeatedly.  You have trouble breathing. These symptoms may represent a serious problem that is an emergency. Do not wait to see if the symptoms will go away. Get medical help right away. Call your local emergency services (911 in the [place] Do not drive yourself to the hospital. Summary  Hypertension is when the force of blood pumping through your arteries is too strong. If this condition is not controlled, it may put you at risk for serious complications.  Your personal target blood pressure may vary depending on your medical conditions, your age, and other factors. For most people, a normal blood pressure is less than 120/80.  Hypertension is managed by lifestyle changes, medicines, or both.  Lifestyle changes to help manage hypertension include losing weight, eating a healthy, low-sodium diet, exercising more, stopping smoking, and limiting alcohol. This information is not intended to replace advice given to you by your health care provider. Make sure you discuss any questions you have with your health care provider. Document Revised: 03/10/2019 Document Reviewed: 01/03/2019 Elsevier Patient Education  2021 [place]. Follow these instructions at home: Eating and drinking  If told, follow the DASH eating plan. To follow this plan: ? Fill one half of your plate at each meal with fruits and vegetables. ? Fill one fourth of your plate at each meal with whole grains. Whole grains include whole-wheat pasta, brown rice, and whole-grain bread. ? Eat or drink low-fat dairy products, such as skim milk or low-fat yogurt. ? Fill one fourth of your plate at each meal with low-fat (lean) proteins. Low-fat proteins include fish, chicken without skin, eggs,  beans, and tofu. ? Avoid fatty meat, cured and processed meat, or chicken with skin. ? Avoid pre-made or processed food.  Eat less than 1,500 mg of salt each day.  Do not drink alcohol if: ? Your doctor tells you not to drink. ? You are pregnant, may be pregnant, or are planning to become pregnant.  If you drink alcohol: ? Limit how much you use to:  0-1 drink a day for women.  0-2 drinks a day for men. ? Be aware of how much alcohol is in your drink. In the [place] one drink equals one 12 oz bottle of beer (355 mL), one 5 oz glass of wine (148 mL), or one 1 oz glass of hard liquor (44 mL).   Lifestyle  Work with your doctor to stay at a healthy weight or to lose weight. Ask your doctor what the best weight is for you.  Get at least 30 minutes of exercise most days of the week. This may include walking, swimming, or biking.  Get at least 30 minutes of exercise that strengthens your muscles (resistance exercise) at least 3 days a week. This may include lifting weights or doing Pilates.  Do not use any products that contain nicotine or tobacco, such as cigarettes, e-cigarettes, and chewing tobacco. If you need help quitting, ask your doctor.  Check your blood pressure at home as told by your doctor.  Keep all follow-up visits as told by your doctor. This is important.   Medicines  Take over-the-counter and prescription medicines only as told by your doctor. Follow directions carefully.  Do not skip doses of blood pressure medicine. The medicine does not work as well if you skip doses.  Skipping doses also puts you at risk for problems.  Ask your doctor about side effects or reactions to medicines that you should watch for. Contact a doctor if you:  Think you are having a reaction to the medicine you are taking.  Have headaches that keep coming back (recurring).  Feel dizzy.  Have swelling in your ankles.  Have trouble with your vision. Get help right away if you:  Get a  very bad headache.  Start to feel mixed up (confused).  Feel weak or numb.  Feel faint.  Have very bad pain in your: ? Chest. ? Belly (abdomen).  Throw up more than once.  Have trouble breathing. Summary  Hypertension is another name for high blood pressure.  High blood pressure forces your heart to work harder to pump blood.  For most people, a normal blood pressure is less than 120/80.  Making healthy choices can help lower blood pressure. If your blood pressure does not get lower with healthy choices, you may need to take medicine. This information is not intended to replace advice given to you by your health care provider. Make sure you discuss any questions you have with your health care provider. Document Revised: 10/13/2017 Document Reviewed: 10/13/2017 Elsevier Patient Education  2021 [employer].

## 2020-07-10 NOTE — Progress Notes (Signed)
New Patient Office Visit  Subjective:  Patient ID: Mary Franco, female    DOB: Oct 21, 1965  Age: 55 y.o. MRN: [id number]  CC:  Chief Complaint  Patient presents with  . Establish Care    HPI Mary Franco is a 55 year old Caucasian female that presents to establish care. She has medical history of hypertension, GERD, and COPD. Medical record review reveals pt had was hospitalized at [facility] 05/06/20-05/08/20 with electrolyte depletion: hypomagnesemia, hypocalcemia, and hypokalemia. Electrolytes were replaced and pt was encouraged to follow-up with gi for chronic ulcerative colitis with chronic diarrhea. Spironolactone was discontinued while hospitalized as possible cause of electrolyte depletion. EMR states family members confided in nursing staff that pt has a history of alcohol abuse.   COPD She is a chronic cigarette smoker with nicotine dependence. She does not wish to stop smoking at this time. She tells me she has been experiencing mild dyspnea with exertion. She uses Breztri inhaler daily. She does not currently have Albuterol inhaler but states she was prescribed one in the past. Mary Franco tells me that she lost her job and health insurance at [employer] due to absenteeism related to health department. She recently obtained health insurance and starts a new job at [employer] next week.    Hypertension Mary Franco has a history of hypertension for several years. Current treatment includes Norvasc 5 mg, Losartan 50 mg, and Zebeta 5 mg.BP 140/82 in office today. Mary Franco tells me that she has been out of Norvasc and Zebeta for several weeks. She denies chest pain, dyspnea, headaches, or dizziness. States she has experienced mild bilateral ankle edema with Norvasc.    GERD Mary Franco has past history of GERD. Current treatment includes Nexium 40 mg daily. States symptoms are currently well-controlled.   Anxiety Mary Franco has a past history of anxiety for several years. Current  treatment includes Xanax 2 mg TID PRN that is prescribed by her ob/gyn. GAD-7 score 7 today. States symptoms are moderately well controlled. She is not currently in counseling.     Past Medical History:  Diagnosis Date  . Alcohol abuse   . Anxiety   . ASD (atrial septal defect) 07/19/2018  . Asthma   . Cervical strain 04/16/2014  . Cervicogenic headache 04/16/2014  . Chronic combined systolic and diastolic CHF (congestive heart failure) ([place]) 03/25/2018   Echo 02/2018: EF 40-45 // Echo 07/2018:  EF 55-60, trivial AI, small secundum ASD (L>R shunting).  . COPD (chronic obstructive pulmonary disease) ([place])   . Fibromyalgia   . Migraines     Past Surgical History:  Procedure Laterality Date  . APPENDECTOMY    . KNEE SURGERY Left   . RIGHT/LEFT HEART CATH AND CORONARY ANGIOGRAPHY N/A 03/08/2018   Procedure: RIGHT/LEFT HEART CATH AND CORONARY ANGIOGRAPHY;  Surgeon: Lorretta Harp, MD;  Location: [facility];  Service: Cardiovascular;  Laterality: N/A;  . TUBAL LIGATION      Family History  Problem Relation Age of Onset  . Cancer Mother   . Colitis Paternal Aunt   . Ovarian cancer Maternal Grandmother     Social History   Socioeconomic History  . Marital status: Divorced    Spouse name: Not on file  . Number of children: 2  . Years of education: Not on file  . Highest education level: Not on file  Occupational History  . Occupation: Textiles  Tobacco Use  . Smoking status: Current Every Day Smoker    Packs/day: 1.00    Years: 40.00  Pack years: 40.00    Types: Cigarettes  . Smokeless tobacco: Never Used  Vaping Use  . Vaping Use: Never used  Substance and Sexual Activity  . Alcohol use: Yes    Comment: 2 x per week  . Drug use: No  . Sexual activity: Not on file  Other Topics Concern  . Not on file  Social History Narrative   ** Merged History Encounter **   Patient is right handed.   Patient drinks 3 cups caffeine daily.   Working on 2nd shift now at her  job.  08-15-14          ROS Review of Systems  Constitutional: Negative for chills, fatigue and fever.  HENT: Negative for congestion, ear pain, rhinorrhea and sore throat.   Respiratory: Positive for shortness of breath (Has used Trelegy in the past). Negative for cough.   Cardiovascular: Negative for chest pain.  Gastrointestinal: Positive for diarrhea. Negative for abdominal pain, constipation, nausea and vomiting.  Genitourinary: Negative for dysuria and urgency.  Musculoskeletal: Negative for back pain and myalgias.  Skin: Negative.   Neurological: Negative for dizziness, weakness, light-headedness and headaches.  Hematological: Negative.   Psychiatric/Behavioral: Negative for dysphoric mood. The patient is nervous/anxious.     Objective:   Today's Vitals: Pulse 91   Temp (!) 97.5 F (36.4 C)   Ht 5\' 3"  (1.6 m)   Wt 148 lb (67.1 kg)   SpO2 100%   BMI 26.22 kg/m   Physical Exam Vitals reviewed.  Constitutional:      Appearance: Normal appearance.  HENT:     Head: Normocephalic.     Right Ear: Tympanic membrane normal.     Left Ear: Tympanic membrane normal.     Nose: Nose normal.     Mouth/Throat:     Mouth: Mucous membranes are moist.  Eyes:     Pupils: Pupils are equal, round, and reactive to light.  Cardiovascular:     Rate and Rhythm: Normal rate and regular rhythm.     Pulses: Normal pulses.     Heart sounds: Normal heart sounds.  Pulmonary:     Effort: Pulmonary effort is normal.     Comments: Lung sounds diminished in bilateral posterior lobes Abdominal:     General: Bowel sounds are normal.     Palpations: Abdomen is soft.  Musculoskeletal:        General: Normal range of motion.     Cervical back: Neck supple.  Skin:    General: Skin is warm and dry.     Capillary Refill: Capillary refill takes less than 2 seconds.  Neurological:     General: No focal deficit present.     Mental Status: She is alert and oriented to person, place, and time.   Psychiatric:        Mood and Affect: Mood normal.        Behavior: Behavior normal.     Assessment & Plan:   1. Essential hypertension - CBC with Differential/Platelet; Future - Comprehensive metabolic panel; Future - TSH; Future - Lipid panel; Future -Continue Losartan 50 mg daily -Continue Norvasc 5 mg daily  2. COPD GOLD II/ active smoker  - albuterol (VENTOLIN HFA) 108 (90 Base) MCG/ACT inhaler; Inhale 2 puffs into the lungs every 6 (six) hours as needed for wheezing or shortness of breath.  Dispense: 8 g; Refill: 0  3. NICM (nonischemic cardiomyopathy) (HCC) - amLODipine (NORVASC) 5 MG tablet; Take 1 tablet by mouth daily. -follow-up with  cardiology as scheduled  4. Dyspnea on exertion - albuterol (VENTOLIN HFA) 108 (90 Base) MCG/ACT inhaler; Inhale 2 puffs into the lungs every 6 (six) hours as needed for wheezing or shortness of breath.  Dispense: 8 g; Refill: 0  5. Gastroesophageal reflux disease without esophagitis - esomeprazole (NEXIUM) 40 MG capsule; Take 40 mg by mouth daily at 12 noon. - Ambulatory referral to Gastroenterology  6. Hx of chronic ulcerative colitis - CBC with Differential/Platelet; Future - Comprehensive metabolic panel; Future - Ambulatory referral to Gastroenterology  7. BMI 26.0-26.9,adult -Heart healthy diet  8. Nicotine dependence with current use -Smoking cessation encouraged  9. Cigarette smoker -Smoking cessation encouraged  10. Encounter to establish care with new doctor    Outpatient Encounter Medications as of 07/10/2020  Medication Sig  . esomeprazole (NEXIUM) 40 MG capsule Take 40 mg by mouth daily at 12 noon.  Marland Kitchen albuterol (PROAIR HFA) 108 (90 Base) MCG/ACT inhaler 2 puffs every 4 hours as needed only  if your can't catch your breath  . alprazolam (XANAX) 2 MG tablet Take 2 mg by mouth at bedtime as needed.  Marland Kitchen amLODipine (NORVASC) 5 MG tablet Take 1 tablet by mouth daily.  . bisoprolol (ZEBETA) 5 MG tablet Take 1 tablet  (5 mg total) by mouth daily.  . Budeson-Glycopyrrol-Formoterol (BREZTRI AEROSPHERE) 160-9-4.8 MCG/ACT AERO Inhale 2 puffs into the lungs 2 (two) times daily.  . famotidine (PEPCID) 20 MG tablet One at bedtime  . ketoconazole (NIZORAL) 2 % shampoo Apply 1 application topically as needed.  Marland Kitchen losartan (COZAAR) 50 MG tablet Take 1 tablet (50 mg total) by mouth daily.  . Magnesium 500 MG TABS Take 1 tablet by mouth daily.  . Melatonin 3 MG TABS Take 3 mg by mouth at bedtime as needed (sleep).   . potassium chloride SA (KLOR-CON M20) 20 MEQ tablet Take 1 tablet (20 mEq total) by mouth daily.  . promethazine (PHENERGAN) 25 MG tablet Take 1 tablet (25 mg total) by mouth every 8 (eight) hours as needed for nausea.  . [DISCONTINUED] pantoprazole (PROTONIX) 40 MG tablet Take 1 tablet (40 mg total) by mouth daily. Take 30-60 min before first meal of the day  . [DISCONTINUED] spironolactone (ALDACTONE) 25 MG tablet Take 1/2 (one-half) tablet by mouth once daily   No facility-administered encounter medications on file as of 07/10/2020.   Return tomorrow for fasting labs Continue medications Notify office of time for mammogram June 27th, 2022 after work schedule Recommend quitting smoking Recommend routine eye and dental exam Follow-up 60-months,fasting  I, Rip Harbour, NP, have reviewed all documentation for this visit. The documentation on 07/10/20 for the exam, diagnosis, procedures, and orders are all accurate and complete.  Follow-up: 22-month, fasting   Signed, Jerrell Belfast, DNP

## 2020-07-11 ENCOUNTER — Other Ambulatory Visit: Payer: 59

## 2020-07-11 DIAGNOSIS — I10 Essential (primary) hypertension: Secondary | ICD-10-CM

## 2020-07-11 DIAGNOSIS — Z8719 Personal history of other diseases of the digestive system: Secondary | ICD-10-CM

## 2020-07-12 LAB — CBC WITH DIFFERENTIAL/PLATELET
Basophils Absolute: 0 10*3/uL (ref 0.0–0.2)
Basos: 1 %
EOS (ABSOLUTE): 0.1 10*3/uL (ref 0.0–0.4)
Eos: 2 %
Hematocrit: 40.4 % (ref 34.0–46.6)
Hemoglobin: 13.2 g/dL (ref 11.1–15.9)
Immature Grans (Abs): 0 10*3/uL (ref 0.0–0.1)
Immature Granulocytes: 0 %
Lymphocytes Absolute: 2.3 10*3/uL (ref 0.7–3.1)
Lymphs: 35 %
MCH: 31.4 pg (ref 26.6–33.0)
MCHC: 32.7 g/dL (ref 31.5–35.7)
MCV: 96 fL (ref 79–97)
Monocytes Absolute: 0.5 10*3/uL (ref 0.1–0.9)
Monocytes: 8 %
Neutrophils Absolute: 3.6 10*3/uL (ref 1.4–7.0)
Neutrophils: 54 %
Platelets: 365 10*3/uL (ref 150–450)
RBC: 4.2 x10E6/uL (ref 3.77–5.28)
RDW: 12.7 % (ref 11.7–15.4)
WBC: 6.6 10*3/uL (ref 3.4–10.8)

## 2020-07-12 LAB — COMPREHENSIVE METABOLIC PANEL
ALT: 20 IU/L (ref 0–32)
AST: 28 IU/L (ref 0–40)
Albumin/Globulin Ratio: 1.7 (ref 1.2–2.2)
Albumin: 4 g/dL (ref 3.8–4.9)
Alkaline Phosphatase: 110 IU/L (ref 44–121)
BUN/Creatinine Ratio: 20 (ref 9–23)
BUN: 17 mg/dL (ref 6–24)
Bilirubin Total: <0.2 mg/dL (ref 0.0–1.2)
CO2: 26 mmol/L (ref 20–29)
Calcium: 9 mg/dL (ref 8.7–10.2)
Chloride: 101 mmol/L (ref 96–106)
Creatinine, Ser: 0.87 mg/dL (ref 0.57–1.00)
Globulin, Total: 2.4 g/dL (ref 1.5–4.5)
Glucose: 81 mg/dL (ref 65–99)
Potassium: 3.9 mmol/L (ref 3.5–5.2)
Sodium: 141 mmol/L (ref 134–144)
Total Protein: 6.4 g/dL (ref 6.0–8.5)
eGFR: 78 mL/min/{1.73_m2} (ref >59)

## 2020-07-12 LAB — LIPID PANEL
Chol/HDL Ratio: 5.3 ratio — ABNORMAL HIGH (ref 0.0–4.4)
Cholesterol, Total: 232 mg/dL — ABNORMAL HIGH (ref 100–199)
HDL: 44 mg/dL (ref >39)
LDL Chol Calc (NIH): 140 mg/dL — ABNORMAL HIGH (ref 0–99)
Triglycerides: 267 mg/dL — ABNORMAL HIGH (ref 0–149)
VLDL Cholesterol Cal: 48 mg/dL — ABNORMAL HIGH (ref 5–40)

## 2020-07-12 LAB — TSH: TSH: 1.86 u[IU]/mL (ref 0.450–4.500)

## 2020-07-12 LAB — CARDIOVASCULAR RISK ASSESSMENT

## 2020-07-16 ENCOUNTER — Other Ambulatory Visit: Payer: Self-pay

## 2020-07-16 DIAGNOSIS — I428 Other cardiomyopathies: Secondary | ICD-10-CM

## 2020-07-16 MED ORDER — AMLODIPINE BESYLATE 5 MG PO TABS: 1.0000 | ORAL_TABLET | Freq: Every day | ORAL | 0 refills | Status: DC

## 2020-07-16 MED ORDER — ROSUVASTATIN CALCIUM 20 MG PO TABS: 20.0000 mg | ORAL_TABLET | Freq: Every day | ORAL | 0 refills | Status: DC

## 2020-07-16 MED ORDER — BISOPROLOL FUMARATE 5 MG PO TABS: 5.0000 mg | ORAL_TABLET | Freq: Every day | ORAL | 6 refills | Status: DC

## 2020-07-16 NOTE — Telephone Encounter (Signed)
Pt agreed to start crestor. Pt requesting other refills as well.   Mary Franco, [place] 07/16/20 10:01 AM

## 2020-10-07 ENCOUNTER — Telehealth (INDEPENDENT_AMBULATORY_CARE_PROVIDER_SITE_OTHER): Payer: 59 | Admitting: Family Medicine

## 2020-10-07 ENCOUNTER — Other Ambulatory Visit: Payer: Self-pay

## 2020-10-07 ENCOUNTER — Encounter: Payer: Self-pay | Admitting: Family Medicine

## 2020-10-07 DIAGNOSIS — A09 Infectious gastroenteritis and colitis, unspecified: Secondary | ICD-10-CM | POA: Diagnosis not present

## 2020-10-07 DIAGNOSIS — J449 Chronic obstructive pulmonary disease, unspecified: Secondary | ICD-10-CM | POA: Diagnosis not present

## 2020-10-07 DIAGNOSIS — R0600 Dyspnea, unspecified: Secondary | ICD-10-CM | POA: Diagnosis not present

## 2020-10-07 DIAGNOSIS — R0609 Other forms of dyspnea: Secondary | ICD-10-CM

## 2020-10-07 DIAGNOSIS — I10 Essential (primary) hypertension: Secondary | ICD-10-CM

## 2020-10-07 DIAGNOSIS — I5042 Chronic combined systolic (congestive) and diastolic (congestive) heart failure: Secondary | ICD-10-CM

## 2020-10-07 DIAGNOSIS — Z79899 Other long term (current) drug therapy: Secondary | ICD-10-CM

## 2020-10-07 MED ORDER — ONDANSETRON HCL 4 MG PO TABS
4.0000 mg | ORAL_TABLET | Freq: Three times a day (TID) | ORAL | 0 refills | Status: DC | PRN
Start: 2020-10-07 — End: 2021-04-29

## 2020-10-07 MED ORDER — LOSARTAN POTASSIUM 50 MG PO TABS: 50.0000 mg | ORAL_TABLET | Freq: Every day | ORAL | 1 refills | Status: DC

## 2020-10-07 MED ORDER — ALBUTEROL SULFATE HFA 108 (90 BASE) MCG/ACT IN AERS: 2.0000 | INHALATION_SPRAY | Freq: Four times a day (QID) | RESPIRATORY_TRACT | 0 refills | Status: DC | PRN

## 2020-10-07 NOTE — Progress Notes (Deleted)
duplicate

## 2020-10-07 NOTE — Progress Notes (Signed)
Virtual Visit via Telephone Note   This visit type was conducted due to national recommendations for restrictions regarding the COVID-19 Pandemic (e.g. social distancing) in an effort to limit this patient's exposure and mitigate transmission in our community.  Due to her co-morbid illnesses, this patient is at least at moderate risk for complications without adequate follow up.  This format is felt to be most appropriate for this patient at this time.  The patient did not have access to video technology/had technical difficulties with video requiring transitioning to audio format only (telephone).  All issues noted in this document were discussed and addressed.  No physical exam could be performed with this format.  Patient verbally consented to a telehealth visit.   Date:  10/09/2020   ID:  Mary Franco, DOB 1965-04-08, MRN [place]:[id number]  Patient Location: Home Provider Location: Office/Clinic  PCP:  Rip Harbour, NP   Evaluation Performed:  Follow-Up Visit  Chief Complaint:  diarrhea  History of Present Illness:    Mary Franco is a 55 y.o. female with URI sxs. Upset stomach started last Friday has been taking imodium (is going to r/s appt with GI) c/o sore throat, dry cough, nausea, diarrhea, and fatigue. She reports abdominal pain but says it feels like it muscle pain and has up to 10 loose bowel movements per day. She denies any blood in her stool. She is unsure if she is having body aches or if it is her fibromyalgia. At home covid test was  negative yesterday. She is not vaccinated. She states she has been exposed to someone where she works, who had a stomach virus.   The patient does not have symptoms concerning for COVID-19 infection (fever, chills, cough, or new shortness of breath).    Past Medical History:  Diagnosis Date   Alcohol abuse    Anxiety    ASD (atrial septal defect) 07/19/2018   Asthma    Cervical strain 04/16/2014   Cervicogenic headache 04/16/2014    Chronic combined systolic and diastolic CHF (congestive heart failure) ([place]) 03/25/2018   Echo 02/2018: EF 40-45 // Echo 07/2018:  EF 55-60, trivial AI, small secundum ASD (L>R shunting).   COPD (chronic obstructive pulmonary disease) (HCC)    Fibromyalgia    Migraines     Past Surgical History:  Procedure Laterality Date   APPENDECTOMY     KNEE SURGERY Left    RIGHT/LEFT HEART CATH AND CORONARY ANGIOGRAPHY N/A 03/08/2018   Procedure: RIGHT/LEFT HEART CATH AND CORONARY ANGIOGRAPHY;  Surgeon: Lorretta Harp, MD;  Location: [facility];  Service: Cardiovascular;  Laterality: N/A;   TUBAL LIGATION      Family History  Problem Relation Age of Onset   Cancer Mother    Colitis Paternal Aunt    Ovarian cancer Maternal Grandmother     Social History   Socioeconomic History   Marital status: Divorced    Spouse name: Not on file   Number of children: 2   Years of education: Not on file   Highest education level: Not on file  Occupational History   Occupation: Textiles  Tobacco Use   Smoking status: Every Day    Packs/day: 1.00    Years: 40.00    Pack years: 40.00    Types: Cigarettes   Smokeless tobacco: Never  Vaping Use   Vaping Use: Never used  Substance and Sexual Activity   Alcohol use: Yes    Comment: 2 x per week  Drug use: No   Sexual activity: Not on file  Other Topics Concern   Not on file  Social History Narrative   ** Merged History Encounter **   Patient is right handed.   Patient drinks 3 cups caffeine daily.   Working on 2nd shift now at her job.  08-15-14          Social Determinants of Radio broadcast assistant Strain: Not on [employer] Insecurity: Not on file  Transportation Needs: Not on file  Physical Activity: Not on file  Stress: Not on file  Social Connections: Not on file  Intimate Partner Violence: Not on file    Outpatient Medications Prior to Visit  Medication Sig Dispense Refill   alprazolam (XANAX) 2 MG tablet Take 2 mg  by mouth at bedtime as needed.     amLODipine (NORVASC) 5 MG tablet Take 1 tablet (5 mg total) by mouth daily. 90 tablet 0   bisoprolol (ZEBETA) 5 MG tablet Take 1 tablet (5 mg total) by mouth daily. 30 tablet 6   Budeson-Glycopyrrol-Formoterol (BREZTRI AEROSPHERE) 160-9-4.8 MCG/ACT AERO Inhale 2 puffs into the lungs 2 (two) times daily. 10.7 g 11   esomeprazole (NEXIUM) 40 MG capsule Take 40 mg by mouth daily at 12 noon.     famotidine (PEPCID) 20 MG tablet One at bedtime 30 tablet 11   ketoconazole (NIZORAL) 2 % shampoo Apply 1 application topically as needed.     losartan (COZAAR) 50 MG tablet Take 1 tablet (50 mg total) by mouth daily. 90 tablet 1   Magnesium 500 MG TABS Take 1 tablet by mouth daily.     Melatonin 3 MG TABS Take 3 mg by mouth at bedtime as needed (sleep).      potassium chloride SA (KLOR-CON M20) 20 MEQ tablet Take 1 tablet (20 mEq total) by mouth daily. 90 tablet 3   promethazine (PHENERGAN) 25 MG tablet Take 1 tablet (25 mg total) by mouth every 8 (eight) hours as needed for nausea. 60 tablet 3   rosuvastatin (CRESTOR) 20 MG tablet Take 1 tablet (20 mg total) by mouth daily. 90 tablet 0   albuterol (PROAIR HFA) 108 (90 Base) MCG/ACT inhaler 2 puffs every 4 hours as needed only  if your can't catch your breath 18 g 11   albuterol (VENTOLIN HFA) 108 (90 Base) MCG/ACT inhaler Inhale 2 puffs into the lungs every 6 (six) hours as needed for wheezing or shortness of breath. 8 g 0   No facility-administered medications prior to visit.    Allergies:   Codeine, Sulfa antibiotics, and Spironolactone   Social History   Tobacco Use   Smoking status: Every Day    Packs/day: 1.00    Years: 40.00    Pack years: 40.00    Types: Cigarettes   Smokeless tobacco: Never  Vaping Use   Vaping Use: Never used  Substance Use Topics   Alcohol use: Yes    Comment: 2 x per week   Drug use: No     Review of Systems  Constitutional:  Negative for chills and fever.  HENT:  Positive  for sore throat. Negative for congestion.   Respiratory:  Positive for cough and shortness of breath (With exertion).   Cardiovascular:  Negative for chest pain.  Gastrointestinal:  Positive for diarrhea and nausea. Negative for constipation and vomiting.  Musculoskeletal:  Positive for myalgias (Fibromyalgia).  Neurological:  Positive for weakness and headaches.    Labs/Other Tests and Data  Reviewed:    Recent Labs: 07/11/2020: ALT 20; BUN 17; Creatinine, Ser 0.87; Hemoglobin 13.2; Platelets 365; Potassium 3.9; Sodium 141; TSH 1.860   Recent Lipid Panel Lab Results  Component Value Date/Time   CHOL 232 (H) 07/11/2020 08:26 AM   TRIG 267 (H) 07/11/2020 08:26 AM   HDL 44 07/11/2020 08:26 AM   CHOLHDL 5.3 (H) 07/11/2020 08:26 AM   LDLCALC 140 (H) 07/11/2020 08:26 AM    Wt Readings from Last 3 Encounters:  07/10/20 148 lb (67.1 kg)  07/10/19 141 lb 8 oz (64.2 kg)  06/26/19 142 lb 3.2 oz (64.5 kg)     Objective:    Vital Signs:  There were no vitals taken for this visit.   Physical Exam   ASSESSMENT & PLAN:   1. Dyspnea on exertion - albuterol (VENTOLIN HFA) 108 (90 Base) MCG/ACT inhaler; Inhale 2 puffs into the lungs every 6 (six) hours as needed for wheezing or shortness of breath.  Dispense: 8 g; Refill: 0  2. COPD GOLD II/ active smoker  - albuterol (VENTOLIN HFA) 108 (90 Base) MCG/ACT inhaler; Inhale 2 puffs into the lungs every 6 (six) hours as needed for wheezing or shortness of breath.  Dispense: 8 g; Refill: 0  3. Diarrhea of infectious origin - ondansetron (ZOFRAN) 4 MG tablet; Take 1 tablet (4 mg total) by mouth every 8 (eight) hours as needed for nausea or vomiting.  Dispense: 30 tablet; Refill: 0 - Cdiff NAA+O+P+Stool Culture; Future   Orders Placed This Encounter  Procedures   Cdiff NAA+O+P+Stool Culture      Meds ordered this encounter  Medications   albuterol (VENTOLIN HFA) 108 (90 Base) MCG/ACT inhaler    Sig: Inhale 2 puffs into the lungs every 6  (six) hours as needed for wheezing or shortness of breath.    Dispense:  8 g    Refill:  0   ondansetron (ZOFRAN) 4 MG tablet    Sig: Take 1 tablet (4 mg total) by mouth every 8 (eight) hours as needed for nausea or vomiting.    Dispense:  30 tablet    Refill:  0   Gurney Maxin, RN (NP student)  I attest that the above note was reviewed and is accurate. The patient was fully evaluated and examined by myself. I agree with plan.   Follow Up:  In Person prn  Signed,  Rochel Brome, MD  10/09/2020 9:55 PM    [place]

## 2020-10-16 ENCOUNTER — Ambulatory Visit (INDEPENDENT_AMBULATORY_CARE_PROVIDER_SITE_OTHER): Payer: 59 | Admitting: Nurse Practitioner

## 2020-10-16 ENCOUNTER — Encounter: Payer: Self-pay | Admitting: Nurse Practitioner

## 2020-10-16 VITALS — BP 118/62 | HR 81 | Temp 97.9°F | Ht 63.0 in | Wt 153.8 lb

## 2020-10-16 DIAGNOSIS — J449 Chronic obstructive pulmonary disease, unspecified: Secondary | ICD-10-CM

## 2020-10-16 DIAGNOSIS — M2550 Pain in unspecified joint: Secondary | ICD-10-CM

## 2020-10-16 DIAGNOSIS — I10 Essential (primary) hypertension: Secondary | ICD-10-CM

## 2020-10-16 DIAGNOSIS — K219 Gastro-esophageal reflux disease without esophagitis: Secondary | ICD-10-CM | POA: Diagnosis not present

## 2020-10-16 DIAGNOSIS — M797 Fibromyalgia: Secondary | ICD-10-CM

## 2020-10-16 DIAGNOSIS — Z8739 Personal history of other diseases of the musculoskeletal system and connective tissue: Secondary | ICD-10-CM

## 2020-10-16 MED ORDER — GABAPENTIN 300 MG PO CAPS: 300.0000 mg | ORAL_CAPSULE | Freq: Every day | ORAL | 1 refills | Status: DC

## 2020-10-16 NOTE — Progress Notes (Signed)
Subjective:  Patient ID: Mary Franco, female    DOB: 1965/05/09  Age: 55 y.o. MRN: [place]:[id number]  Chief Complaint  Patient presents with   Hypertension   COPD    HPI: Mary Franco is a 55 year old Caucasian female that presents for follow-up of hypertension, COPD, and GERD. She tells me that she recently began a new job at [employer] but the 2nd shift hours are difficult. States she plans to seek other employment opportunities. She states she is experiencing increased stress related to caring for her elderly mother and sharing the workload with her siblings. She tells me that she is experiencing grief due to her late son's birthday coming up in October and the anniversary of his death. She has declined counseling or medication at this time.  Mary Franco tells me that she has a past history of fibromyalgia. She is unclear of previous diagnosis, treating physician, or treatment medication. States she is experiencing myalgias and arthralgias in bilateral upper and lower extremities, hand and feet.    Hypertension She was last seen for hypertension 3 months ago.  BP at that visit was 140/82. Management since that visit includes Norvasc 5 mg, Zebeta 5 mg, and Cozaar 50 mg.  She reports excellent compliance with treatment. She is not having side effects.  She is following a Regular diet. She is not exercising. She does smoke.  Use of agents associated with hypertension: none.   Outside blood pressures are not being checked Symptoms: No chest pain No chest pressure  No palpitations No syncope  No dyspnea No orthopnea  No paroxysmal nocturnal dyspnea No lower extremity edema   Pertinent labs: Lab Results  Component Value Date   CHOL 232 (H) 07/11/2020   HDL 44 07/11/2020   LDLCALC 140 (H) 07/11/2020   TRIG 267 (H) 07/11/2020   CHOLHDL 5.3 (H) 07/11/2020   Lab Results  Component Value Date   NA 141 07/11/2020   K 3.9 07/11/2020   CREATININE 0.87 07/11/2020   GFRNONAA 76  11/21/2018   GFRAA 87 11/21/2018   GLUCOSE 81 07/11/2020     The 10-year ASCVD risk score Mikey Bussing DC Jr., et al., 2013) is: 8.3%   COPD, Follow up  She was last seen for this 3 months ago. Current treatment includes Breztri and Albuterol inhalers.   She reports excellent compliance with treatment. She is not having side effects.  she uses rescue inhaler 4 per weeks. She IS experiencing cough. She is NOT experiencing weight loss. she reports breathing is Improved.   TLC  Date Value Ref Range Status  08/31/2018 6.15 L Final   GERD, Follow up:  The patient was last seen for GERD 3 months ago. Changes made since that visit include Pepcid 20 mg daily.  She reports excellent compliance with treatment. She is not having side effects. . She is NOT experiencing belching and eructation    Current Outpatient Medications on File Prior to Visit  Medication Sig Dispense Refill   albuterol (VENTOLIN HFA) 108 (90 Base) MCG/ACT inhaler Inhale 2 puffs into the lungs every 6 (six) hours as needed for wheezing or shortness of breath. 8 g 0   alprazolam (XANAX) 2 MG tablet Take 2 mg by mouth at bedtime as needed.     amLODipine (NORVASC) 5 MG tablet Take 1 tablet (5 mg total) by mouth daily. 90 tablet 0   bisoprolol (ZEBETA) 5 MG tablet Take 1 tablet (5 mg total) by mouth daily. 30 tablet 6   Budeson-Glycopyrrol-Formoterol (  BREZTRI AEROSPHERE) 160-9-4.8 MCG/ACT AERO Inhale 2 puffs into the lungs 2 (two) times daily. 10.7 g 11   esomeprazole (NEXIUM) 40 MG capsule Take 40 mg by mouth daily at 12 noon.     famotidine (PEPCID) 20 MG tablet One at bedtime 30 tablet 11   ketoconazole (NIZORAL) 2 % shampoo Apply 1 application topically as needed.     KETOCONAZOLE, TOPICAL, (NIZORAL A-D) 1 % SHAM Nizoral A-D 1 % shampoo  APPLY SHAMPOO BY TOPICAL ROUTE EVERY 3 DAYS LATHER, RINSE, AND REPEAT     losartan (COZAAR) 50 MG tablet Take 1 tablet (50 mg total) by mouth daily. 90 tablet 1   Magnesium 500 MG  TABS Take 1 tablet by mouth daily.     Melatonin 3 MG TABS Take 3 mg by mouth at bedtime as needed (sleep).      ondansetron (ZOFRAN) 4 MG tablet Take 1 tablet (4 mg total) by mouth every 8 (eight) hours as needed for nausea or vomiting. 30 tablet 0   potassium chloride SA (KLOR-CON M20) 20 MEQ tablet Take 1 tablet (20 mEq total) by mouth daily. 90 tablet 3   promethazine (PHENERGAN) 25 MG tablet Take 1 tablet (25 mg total) by mouth every 8 (eight) hours as needed for nausea. 60 tablet 3   rosuvastatin (CRESTOR) 20 MG tablet Take 1 tablet (20 mg total) by mouth daily. 90 tablet 0   No current facility-administered medications on file prior to visit.   Past Medical History:  Diagnosis Date   Alcohol abuse    Anxiety    ASD (atrial septal defect) 07/19/2018   Asthma    Cervical strain 04/16/2014   Cervicogenic headache 04/16/2014   Chronic combined systolic and diastolic CHF (congestive heart failure) ([place]) 03/25/2018   Echo 02/2018: EF 40-45 // Echo 07/2018:  EF 55-60, trivial AI, small secundum ASD (L>R shunting).   COPD (chronic obstructive pulmonary disease) (HCC)    Fibromyalgia    Migraines    Past Surgical History:  Procedure Laterality Date   APPENDECTOMY     KNEE SURGERY Left    RIGHT/LEFT HEART CATH AND CORONARY ANGIOGRAPHY N/A 03/08/2018   Procedure: RIGHT/LEFT HEART CATH AND CORONARY ANGIOGRAPHY;  Surgeon: Lorretta Harp, MD;  Location: [facility];  Service: Cardiovascular;  Laterality: N/A;   TUBAL LIGATION      Family History  Problem Relation Age of Onset   Cancer Mother    Colitis Paternal Aunt    Ovarian cancer Maternal Grandmother    Social History   Socioeconomic History   Marital status: Divorced    Spouse name: Not on file   Number of children: 2   Years of education: Not on file   Highest education level: Not on file  Occupational History   Occupation: [employer]  Tobacco Use   Smoking status: Every Day    Packs/day: 1.00    Years: 40.00    Pack  years: 40.00    Types: Cigarettes   Smokeless tobacco: Never  Vaping Use   Vaping Use: Never used  Substance and Sexual Activity   Alcohol use: Yes    Comment: 2 x per week   Drug use: No   Sexual activity: Not on file  Other Topics Concern   Not on file  Social History Narrative   ** Merged History Encounter **   Patient is right handed.   Patient drinks 3 cups caffeine daily.   Working on 2nd shift now at her job.  08-15-14          Social Determinants of Health   Financial Resource Strain: Not on file  Food Insecurity: Not on file  Transportation Needs: Not on file  Physical Activity: Not on file  Stress: Not on file  Social Connections: Not on file    Review of Systems  Constitutional:  Negative for appetite change, fatigue and fever.  HENT:  Negative for congestion, ear pain, sinus pressure and sore throat.   Eyes:  Negative for pain.  Respiratory:  Positive for cough and shortness of breath. Negative for chest tightness and wheezing.   Cardiovascular:  Negative for chest pain and palpitations.  Gastrointestinal:  Negative for abdominal pain, constipation, diarrhea, nausea and vomiting.  Genitourinary:  Negative for dysuria and hematuria.  Musculoskeletal:  Positive for arthralgias and myalgias. Negative for back pain and joint swelling.  Skin:  Negative for rash.  Allergic/Immunologic: Positive for environmental allergies.  Neurological:  Negative for dizziness, weakness and headaches.  Hematological: Negative.   Psychiatric/Behavioral:  Positive for sleep disturbance. Negative for dysphoric mood. The patient is not nervous/anxious.     Objective:  BP 118/62 (BP Location: Left Arm, Patient Position: Sitting)   Pulse 81   Temp 97.9 F (36.6 C) (Temporal)   Ht '5\' 3"'$  (1.6 m)   Wt 153 lb 12.8 oz (69.8 kg)   SpO2 99%   BMI 27.24 kg/m   BP/Weight 10/16/2020 07/10/2020 0000000  Systolic BP [id number] [id number]  Diastolic BP 62 82 86  Wt. (Lbs) 153.8 148 141.5  BMI  27.24 26.22 25.07    Physical Exam Vitals reviewed.  Constitutional:      Appearance: Normal appearance.  HENT:     Right Ear: Tympanic membrane normal.     Left Ear: Tympanic membrane normal.     Nose: Nose normal.     Mouth/Throat:     Mouth: Mucous membranes are dry.  Cardiovascular:     Rate and Rhythm: Normal rate and regular rhythm.     Pulses: Normal pulses.     Heart sounds: Normal heart sounds.  Pulmonary:     Effort: Pulmonary effort is normal.     Breath sounds: Normal breath sounds.  Abdominal:     General: Bowel sounds are normal.     Palpations: Abdomen is soft.  Musculoskeletal:        General: Normal range of motion.     Cervical back: Neck supple.  Skin:    General: Skin is warm and dry.     Capillary Refill: Capillary refill takes less than 2 seconds.  Neurological:     General: No focal deficit present.     Mental Status: She is alert and oriented to person, place, and time.  Psychiatric:        Mood and Affect: Mood normal.        Behavior: Behavior normal.     Lab Results  Component Value Date   WBC 6.6 07/11/2020   HGB 13.2 07/11/2020   HCT 40.4 07/11/2020   PLT 365 07/11/2020   GLUCOSE 81 07/11/2020   CHOL 232 (H) 07/11/2020   TRIG 267 (H) 07/11/2020   HDL 44 07/11/2020   LDLCALC 140 (H) 07/11/2020   ALT 20 07/11/2020   AST 28 07/11/2020   NA 141 07/11/2020   K 3.9 07/11/2020   CL 101 07/11/2020   CREATININE 0.87 07/11/2020   BUN 17 07/11/2020   CO2 26 07/11/2020   TSH 1.860 07/11/2020  Assessment & Plan:     1. Essential hypertension-well controlled - CBC With Diff/Platelet - Comprehensive metabolic panel - Lipid panel - VITAMIN D 25 Hydroxy (Vit-D Deficiency, Fractures) -continue Cozaar 50 mg, Amlodipine 5 mg, and Zebeta 5 mg -heart healthy diet  2. COPD GOLD II/ active smoker-stable -recommend smoking cessation -continue Breztri and Albuterol inhalers   3. Gastroesophageal reflux disease without esophagitis-well  controlled -continue Pepcid 20 mg daily -avoid foods that trigger GERD  4. Polyarthralgia - VITAMIN D 25 Hydroxy (Vit-D Deficiency, Fractures -gabapentin 300 mg QHS  5. Fibromyalgia syndrome - gabapentin (NEURONTIN) 300 MG capsule; Take 1 capsule (300 mg total) by mouth at bedtime.  Dispense: 30 capsule; Refill: 1  6. History of fibromyalgia    Begin Neurontin 300 mg at bedtime for muscle pain Continue medications Follow-up fasting in 60-month  Follow-up: 357-month An After Visit Summary was printed and given to the patient.  I,Lauren M Auman,acting as a scEducation administratoror ShCIT GroupNP.,have documented all relevant documentation on the behalf of ShRip HarbourNP,as directed by  ShRip HarbourNP while in the presence of ShRip HarbourNP.   I, ShRip HarbourNP, have reviewed all documentation for this visit. The documentation on 10/16/20 for the exam, diagnosis, procedures, and orders are all accurate and complete.   ShRip HarbourNP [phone]

## 2020-10-16 NOTE — Patient Instructions (Signed)
Begin Neurontin 300 mg at bedtime for muscle pain Continue medications Follow-up fasting in 52-month  Managing the Challenge of Quitting Smoking Quitting smoking is a physical and mental challenge. You will face cravings, withdrawal symptoms, and temptation. Before quitting, work with your health care provider to make a plan that can help you manage quitting. Preparation can help you quit and keep you from giving in. How to manage lifestyle changes Managing stress Stress can make you want to smoke, and wanting to smoke may cause stress. It is important to find ways to manage your stress. You might try some of the following: Practice relaxation techniques. Breathe slowly and deeply, in through your nose and out through your mouth. Listen to music. Soak in a bath or take a shower. Imagine a peaceful place or vacation. Get some support. Talk with family or friends about your stress. Join a support group. Talk with a counselor or therapist. Get some physical activity. Go for a walk, run, or bike ride. Play a favorite sport. Practice yoga.  Medicines Talk with your health care provider about medicines that might help you deal with cravings and make quitting easier for you. Relationships Social situations can be difficult when you are quitting smoking. To manage this, you can: Avoid parties and other social situations where people might be smoking. Avoid alcohol. Leave right away if you have the urge to smoke. Explain to your family and friends that you are quitting smoking. Ask for support and let them know you might be a bit grumpy. Plan activities where smoking is not an option. General instructions Be aware that many people gain weight after they quit smoking. However, not everyone does. To keep from gaining weight, have a plan in place before you quit and stick to the plan after you quit. Your plan should include: Having healthy snacks. When you have a craving, it may help to: Eat  popcorn, carrots, celery, or other cut vegetables. Chew sugar-free gum. Changing how you eat. Eat small portion sizes at meals. Eat 4-6 small meals throughout the day instead of 1-2 large meals a day. Be mindful when you eat. Do not watch television or do other things that might distract you as you eat. Exercising regularly. Make time to exercise each day. If you do not have time for a long workout, do short bouts of exercise for 5-10 minutes several times a day. Do some form of strengthening exercise, such as weight lifting. Do some exercise that gets your heart beating and causes you to breathe deeply, such as walking fast, running, swimming, or biking. This is very important. Drinking plenty of water or other low-calorie or no-calorie drinks. Drink 6-8 glasses of water daily.  How to recognize withdrawal symptoms Your body and mind may experience discomfort as you try to get used to not having nicotine in your system. These effects are called withdrawal symptoms. They may include: Feeling hungrier than normal. Having trouble concentrating. Feeling irritable or restless. Having trouble sleeping. Feeling depressed. Craving a cigarette. To manage withdrawal symptoms: Avoid places, people, and activities that trigger your cravings. Remember why you want to quit. Get plenty of sleep. Avoid coffee and other caffeinated drinks. These may worsen some of your symptoms. These symptoms may surprise you. But be assured that they are normal to have when quitting smoking. How to manage cravings Come up with a plan for how to deal with your cravings. The plan should include the following: A definition of the specific situation you want  to deal with. An alternative action you will take. A clear idea for how this action will help. The name of someone who might help you with this. Cravings usually last for 5-10 minutes. Consider taking the following actions to help you with your plan to deal with  cravings: Keep your mouth busy. Chew sugar-free gum. Suck on hard candies or a straw. Brush your teeth. Keep your hands and body busy. Change to a different activity right away. Squeeze or play with a ball. Do an activity or a hobby, such as making bead jewelry, practicing needlepoint, or working with wood. Mix up your normal routine. Take a short exercise break. Go for a quick walk or run up and down stairs. Focus on doing something kind or helpful for someone else. Call a friend or family member to talk during a craving. Join a support group. Contact a quitline. Where to find support To get help or find a support group: Call the [facility]'s Smoking Quitline: 1-800-QUIT NOW 240-777-1228) Visit the website of the Substance Abuse and [place]: ktimeonline.com Text QUIT to SmokefreeTXTAZ:4618977 Where to find more information Visit these websites to find more information on quitting smoking: [place]: www.smokefree.gov American Lung Association: www.lung.org American Cancer Society: www.cancer.org Centers for Disease Control and Prevention: http://www.wolf.info/ American Heart Association: www.heart.org Contact a health care provider if: You want to change your plan for quitting. The medicines you are taking are not helping. Your eating feels out of control or you cannot sleep. Get help right away if: You feel depressed or become very anxious. Summary Quitting smoking is a physical and mental challenge. You will face cravings, withdrawal symptoms, and temptation to smoke again. Preparation can help you as you go through these challenges. Try different techniques to manage stress, handle social situations, and prevent weight gain. You can deal with cravings by keeping your mouth busy (such as by chewing gum), keeping your hands and body busy, calling family or friends, or contacting a quitline for people who want to quit smoking. You can  deal with withdrawal symptoms by avoiding places where people smoke, getting plenty of rest, and avoiding drinks with caffeine. This information is not intended to replace advice given to you by your health care provider. Make sure you discuss any questions you have with your health care provider. Document Revised: 11/22/2018 Document Reviewed: 11/22/2018 Elsevier Patient Education  [place].   Muscle Pain, Adult Muscle pain, also called myalgia, is a condition in which a person has pain in one or more muscles in the body. Muscle pain may be mild, moderate, or severe. It may feel sharp, achy, or burning. In most cases, the pain lasts only a short time and goes away without treatment. Muscle pain can result from using muscles in a new or different way or after a period of inactivity. It is normal to feel some muscle pain after starting an exercise program. Muscles that have not been used often will be sore at first. What are the causes? This condition is caused by using muscles in a new or different way after a period of inactivity. Other causes may include: Overuse or muscle strain, especially if you are not in shape. This is the most common cause of muscle pain. Injury or bruising. Infectious diseases, including diseases caused by viruses, such as the flu (influenza). Fibromyalgia.This is a long-term, or chronic, condition that causes muscle tenderness, tiredness (fatigue), and headache. Autoimmune or rheumatologic diseases. These are  conditions, such as lupus, in which the body's defense system (immunesystem) attacks areas in the body. Certain medicines, including ACE inhibitors and statins. What are the signs or symptoms? The main symptom of this condition is sore or painful muscles, including during activity and when stretching. You may also have slight swelling. How is this diagnosed? This condition is diagnosed with a physical exam. Your health care provider will ask questions  about your pain and when it began. If you have not had muscle pain for very long, your health care provider may want to wait before doing much testing. If your muscle pain has lasted a long time, tests may be done right away. In some cases, this may include tests to rule out certain conditions or illnesses. How is this treated? Treatment for this condition depends on the cause. Home care is often enough to relieve muscle pain. Your health care provider may also prescribe NSAIDs, such as ibuprofen. Follow these instructions at home: Medicines Take over-the-counter and prescription medicines only as told by your health care provider. Ask your health care provider if the medicine prescribed to you requires you to avoid driving or using machinery. Managing pain, swelling, and discomfort   If directed, put ice on the painful area. To do this: Put ice in a plastic bag. Place a towel between your skin and the bag. Leave the ice on for 20 minutes, 2-3 times a day. For the first 2 days of muscle soreness, or if there is swelling: Do not soak in hot baths. Do not use a hot tub, steam room, sauna, heating pad, or other heat source. After 48-72 hours, you may alternate between applying ice and applying heat as told by your health care provider. If directed, apply heat to the affected area as often as told by your health care provider. Use the heat source that your health care provider recommends, such as a moist heat pack or a heating pad. Place a towel between your skin and the heat source. Leave the heat on for 20-30 minutes. Remove the heat if your skin turns bright red. This is especially important if you are unable to feel pain, heat, or cold. You may have a greater risk of getting burned. If you have an injury, raise (elevate) the injured area above the level of your heart while you are sitting or lying down. Activity  If overuse is causing your muscle pain: Slow down your activities until the pain  goes away. Do regular, gentle exercises if you are not usually active. Warm up before exercising. Stretch before and after exercising. This can help lower the risk of muscle pain. Do not continue working out if the pain is severe. Severe pain could mean that you have injured a muscle. Do not lift anything that is heavier than 5-10 lb (2.3-4.5 kg), or the limit that you are told, until your health care provider says that it is safe. Return to your normal activities as told by your health care provider. Ask your health care provider what activities are safe for you. General instructions Do not use any products that contain nicotine or tobacco, such as cigarettes, e-cigarettes, and chewing tobacco. These can delay healing. If you need help quitting, ask your health care provider. Keep all follow-up visits as told by your health care provider. This is important. Contact a health care provider if you have: Muscle pain that gets worse and medicines do not help. Muscle pain that lasts longer than 3 days. A rash  or fever along with muscle pain. Muscle pain after a tick bite. Muscle pain while working out, even though you are in good physical condition. Redness, soreness, or swelling along with muscle pain. Muscle pain after starting a new medicine or changing the dose of a medicine. Get help right away if you have: Trouble breathing. Trouble swallowing. Muscle pain along with a stiff neck, fever, and vomiting. Severe muscle weakness or you cannot move part of your body. These symptoms may represent a serious problem that is an emergency. Do not wait to see if the symptoms will go away. Get medical help right away. Call your local emergency services (911 in the [place] Do not drive yourself to the hospital. Summary Muscle pain usually lasts only a short time and goes away without treatment. This condition is caused by using muscles in a new or different way after a period of inactivity. If your  muscle pain lasts longer than 3 days, tell your health care provider. This information is not intended to replace advice given to you by your health care provider. Make sure you discuss any questions you have with your health care provider. Document Revised: 11/11/2018 Document Reviewed: 11/11/2018 Elsevier Patient Education  2022 [employer].

## 2020-10-17 LAB — LIPID PANEL
Chol/HDL Ratio: 3.6 ratio (ref 0.0–4.4)
Cholesterol, Total: 135 mg/dL (ref 100–199)
HDL: 37 mg/dL — ABNORMAL LOW (ref >39)
LDL Chol Calc (NIH): 71 mg/dL (ref 0–99)
Triglycerides: 158 mg/dL — ABNORMAL HIGH (ref 0–149)
VLDL Cholesterol Cal: 27 mg/dL (ref 5–40)

## 2020-10-17 LAB — CBC WITH DIFF/PLATELET
Basophils Absolute: 0 10*3/uL (ref 0.0–0.2)
Basos: 1 %
EOS (ABSOLUTE): 0.1 10*3/uL (ref 0.0–0.4)
Eos: 1 %
Hematocrit: 35.6 % (ref 34.0–46.6)
Hemoglobin: 12 g/dL (ref 11.1–15.9)
Immature Grans (Abs): 0 10*3/uL (ref 0.0–0.1)
Immature Granulocytes: 0 %
Lymphocytes Absolute: 2.2 10*3/uL (ref 0.7–3.1)
Lymphs: 29 %
MCH: 30.4 pg (ref 26.6–33.0)
MCHC: 33.7 g/dL (ref 31.5–35.7)
MCV: 90 fL (ref 79–97)
Monocytes Absolute: 0.8 10*3/uL (ref 0.1–0.9)
Monocytes: 11 %
Neutrophils Absolute: 4.6 10*3/uL (ref 1.4–7.0)
Neutrophils: 58 %
Platelets: 356 10*3/uL (ref 150–450)
RBC: 3.95 x10E6/uL (ref 3.77–5.28)
RDW: 12.9 % (ref 11.7–15.4)
WBC: 7.7 10*3/uL (ref 3.4–10.8)

## 2020-10-17 LAB — COMPREHENSIVE METABOLIC PANEL
ALT: 13 IU/L (ref 0–32)
AST: 20 IU/L (ref 0–40)
Albumin/Globulin Ratio: 1.8 (ref 1.2–2.2)
Albumin: 4 g/dL (ref 3.8–4.9)
Alkaline Phosphatase: 117 IU/L (ref 44–121)
BUN/Creatinine Ratio: 14 (ref 9–23)
BUN: 12 mg/dL (ref 6–24)
Bilirubin Total: <0.2 mg/dL (ref 0.0–1.2)
CO2: 27 mmol/L (ref 20–29)
Calcium: 8 mg/dL — ABNORMAL LOW (ref 8.7–10.2)
Chloride: 101 mmol/L (ref 96–106)
Creatinine, Ser: 0.86 mg/dL (ref 0.57–1.00)
Globulin, Total: 2.2 g/dL (ref 1.5–4.5)
Glucose: 105 mg/dL — ABNORMAL HIGH (ref 65–99)
Potassium: 3.4 mmol/L — ABNORMAL LOW (ref 3.5–5.2)
Sodium: 143 mmol/L (ref 134–144)
Total Protein: 6.2 g/dL (ref 6.0–8.5)
eGFR: 79 mL/min/{1.73_m2} (ref >59)

## 2020-10-17 LAB — CARDIOVASCULAR RISK ASSESSMENT

## 2020-10-17 LAB — VITAMIN D 25 HYDROXY (VIT D DEFICIENCY, FRACTURES): Vit D, 25-Hydroxy: 24 ng/mL — ABNORMAL LOW (ref 30.0–100.0)

## 2020-11-05 ENCOUNTER — Encounter: Payer: Self-pay | Admitting: Nurse Practitioner

## 2020-11-05 ENCOUNTER — Ambulatory Visit (INDEPENDENT_AMBULATORY_CARE_PROVIDER_SITE_OTHER): Payer: 59 | Admitting: Nurse Practitioner

## 2020-11-05 ENCOUNTER — Other Ambulatory Visit: Payer: Self-pay | Admitting: Nurse Practitioner

## 2020-11-05 VITALS — BP 118/68 | HR 70 | Temp 97.8°F | Ht 63.0 in | Wt 151.0 lb

## 2020-11-05 DIAGNOSIS — R0609 Other forms of dyspnea: Secondary | ICD-10-CM

## 2020-11-05 DIAGNOSIS — B370 Candidal stomatitis: Secondary | ICD-10-CM

## 2020-11-05 DIAGNOSIS — J309 Allergic rhinitis, unspecified: Secondary | ICD-10-CM | POA: Diagnosis not present

## 2020-11-05 DIAGNOSIS — J029 Acute pharyngitis, unspecified: Secondary | ICD-10-CM

## 2020-11-05 DIAGNOSIS — R0981 Nasal congestion: Secondary | ICD-10-CM

## 2020-11-05 DIAGNOSIS — M19071 Primary osteoarthritis, right ankle and foot: Secondary | ICD-10-CM

## 2020-11-05 DIAGNOSIS — J0181 Other acute recurrent sinusitis: Secondary | ICD-10-CM

## 2020-11-05 DIAGNOSIS — R059 Cough, unspecified: Secondary | ICD-10-CM | POA: Diagnosis not present

## 2020-11-05 DIAGNOSIS — M19072 Primary osteoarthritis, left ankle and foot: Secondary | ICD-10-CM

## 2020-11-05 DIAGNOSIS — J449 Chronic obstructive pulmonary disease, unspecified: Secondary | ICD-10-CM

## 2020-11-05 DIAGNOSIS — R0600 Dyspnea, unspecified: Secondary | ICD-10-CM

## 2020-11-05 LAB — POCT RAPID STREP A (OFFICE): Rapid Strep A Screen: NEGATIVE

## 2020-11-05 LAB — POC COVID19 BINAXNOW: SARS Coronavirus 2 Ag: NEGATIVE

## 2020-11-05 MED ORDER — ALBUTEROL SULFATE HFA 108 (90 BASE) MCG/ACT IN AERS: 2.0000 | INHALATION_SPRAY | Freq: Four times a day (QID) | RESPIRATORY_TRACT | 3 refills | Status: DC | PRN

## 2020-11-05 MED ORDER — FLUCONAZOLE 150 MG PO TABS
150.0000 mg | ORAL_TABLET | Freq: Once | ORAL | 0 refills | Status: AC
Start: 2020-11-05 — End: 2020-11-05

## 2020-11-05 MED ORDER — BREZTRI AEROSPHERE 160-9-4.8 MCG/ACT IN AERO: 2.0000 | INHALATION_SPRAY | Freq: Two times a day (BID) | RESPIRATORY_TRACT | 0 refills | Status: DC

## 2020-11-05 MED ORDER — AMOXICILLIN 875 MG PO TABS: 875.0000 mg | ORAL_TABLET | Freq: Two times a day (BID) | ORAL | 0 refills | Status: AC

## 2020-11-05 MED ORDER — MELOXICAM 7.5 MG PO TABS
7.5000 mg | ORAL_TABLET | Freq: Every day | ORAL | 0 refills | Status: DC
Start: 2020-11-05 — End: 2021-10-09

## 2020-11-05 MED ORDER — NYSTATIN 100000 UNIT/ML MT SUSP: 5.0000 mL | Freq: Four times a day (QID) | OROMUCOSAL | 0 refills | Status: DC

## 2020-11-05 MED ORDER — BREZTRI AEROSPHERE 160-9-4.8 MCG/ACT IN AERO: 2.0000 | INHALATION_SPRAY | Freq: Two times a day (BID) | RESPIRATORY_TRACT | 11 refills | Status: DC

## 2020-11-05 NOTE — Patient Instructions (Addendum)
Use Breztri inhaler daily, rinse mouth afterwards Use Albuterol inhaler as needed Use Nystatin mouth wash four times daily Begin Mobic 7.5 mg for bilateral foot osteoarthritis, with food Amoxicillin twice daily for 10 days Follow-up as needed  Sinusitis, Adult Sinusitis is soreness and swelling (inflammation) of your sinuses. Sinuses are hollow spaces in the bones around your face. They are located: Around your eyes. In the middle of your forehead. Behind your nose. In your cheekbones. Your sinuses and nasal passages are lined with a fluid called mucus. Mucus drains out of your sinuses. Swelling can trap mucus in your sinuses. This lets germs (bacteria, virus, or fungus) grow, which leads to infection. Most of the time, this condition is caused by a virus. What are the causes? This condition is caused by: Allergies. Asthma. Germs. Things that block your nose or sinuses. Growths in the nose (nasal polyps). Chemicals or irritants in the air. Fungus (rare). What increases the risk? You are more likely to develop this condition if: You have a weak body defense system (immune system). You do a lot of swimming or diving. You use nasal sprays too much. You smoke. What are the signs or symptoms? The main symptoms of this condition are pain and a feeling of pressure around the sinuses. Other symptoms include: Stuffy nose (congestion). Runny nose (drainage). Swelling and warmth in the sinuses. Headache. Toothache. A cough that may get worse at night. Mucus that collects in the throat or the back of the nose (postnasal drip). Being unable to smell and taste. Being very tired (fatigue). A fever. Sore throat. Bad breath. How is this diagnosed? This condition is diagnosed based on: Your symptoms. Your medical history. A physical exam. Tests to find out if your condition is short-term (acute) or long-term (chronic). Your doctor may: Check your nose for growths (polyps). Check your  sinuses using a tool that has a light (endoscope). Check for allergies or germs. Do imaging tests, such as an MRI or CT scan. How is this treated? Treatment for this condition depends on the cause and whether it is short-term or long-term. If caused by a virus, your symptoms should go away on their own within 10 days. You may be given medicines to relieve symptoms. They include: Medicines that shrink swollen tissue in the nose. Medicines that treat allergies (antihistamines). A spray that treats swelling of the nostrils.  Rinses that help get rid of thick mucus in your nose (nasal saline washes). If caused by bacteria, your doctor may wait to see if you will get better without treatment. You may be given antibiotic medicine if you have: A very bad infection. A weak body defense system. If caused by growths in the nose, you may need to have surgery. Follow these instructions at home: Medicines Take, use, or apply over-the-counter and prescription medicines only as told by your doctor. These may include nasal sprays. If you were prescribed an antibiotic medicine, take it as told by your doctor. Do not stop taking the antibiotic even if you start to feel better. Hydrate and humidify  Drink enough water to keep your pee (urine) pale yellow. Use a cool mist humidifier to keep the humidity level in your home above 50%. Breathe in steam for 10-15 minutes, 3-4 times a day, or as told by your doctor. You can do this in the bathroom while a hot shower is running. Try not to spend time in cool or dry air. Rest Rest as much as you can. Sleep with your  head raised (elevated). Make sure you get enough sleep each night. General instructions  Put a warm, moist washcloth on your face 3-4 times a day, or as often as told by your doctor. This will help with discomfort. Wash your hands often with soap and water. If there is no soap and water, use hand sanitizer. Do not smoke. Avoid being around people  who are smoking (secondhand smoke). Keep all follow-up visits as told by your doctor. This is important. Contact a doctor if: You have a fever. Your symptoms get worse. Your symptoms do not get better within 10 days. Get help right away if: You have a very bad headache. You cannot stop throwing up (vomiting). You have very bad pain or swelling around your face or eyes. You have trouble seeing. You feel confused. Your neck is stiff. You have trouble breathing. Summary Sinusitis is swelling of your sinuses. Sinuses are hollow spaces in the bones around your face. This condition is caused by tissues in your nose that become inflamed or swollen. This traps germs. These can lead to infection. If you were prescribed an antibiotic medicine, take it as told by your doctor. Do not stop taking it even if you start to feel better. Keep all follow-up visits as told by your doctor. This is important. This information is not intended to replace advice given to you by your health care provider. Make sure you discuss any questions you have with your health care provider. Document Revised: 07/05/2017 Document Reviewed: 07/05/2017 Elsevier Patient Education  2022 [employer].

## 2020-11-05 NOTE — Progress Notes (Signed)
Acute Office Visit  Subjective:    Patient ID: Mary Franco, female    DOB: [birth year], 55 y.o.   MRN: [id number]  Chief Complaint  Patient presents with   Head congestion    ST/drainage    HPI Patient is in today for nasal congestion, sore throat, and cough. Onset of symptoms was one week ago. Treatment has included OTC allergy medication but she cannot recall the name. She has past history of chronic allergic rhinitis and COPD. She tells me that she is out of Albuterol and Breztri inhalers. She denies any recent hospital visits for COPD exacerbation.   She tells me that she is experiencing bilateral foot pain and swelling. Aggravating factors include weight bearing and activity. No alleviating factors. States she is required to stand for several hours at her job at a [employer]. She has not tried any treatments at home.   Past Medical History:  Diagnosis Date   Alcohol abuse    Anxiety    ASD (atrial septal defect) 07/19/2018   Asthma    Cervical strain 04/16/2014   Cervicogenic headache 04/16/2014   Chronic combined systolic and diastolic CHF (congestive heart failure) ([place]) 03/25/2018   Echo 02/2018: EF 40-45 // Echo 07/2018:  EF 55-60, trivial AI, small secundum ASD (L>R shunting).   COPD (chronic obstructive pulmonary disease) ([facility])    Fibromyalgia    Migraines     Past Surgical History:  Procedure Laterality Date   APPENDECTOMY     KNEE SURGERY Left    RIGHT/LEFT HEART CATH AND CORONARY ANGIOGRAPHY N/A 03/08/2018   Procedure: RIGHT/LEFT HEART CATH AND CORONARY ANGIOGRAPHY;  Surgeon: Lorretta Harp, MD;  Location: [facility];  Service: Cardiovascular;  Laterality: N/A;   TUBAL LIGATION      Family History  Problem Relation Age of Onset   Cancer Mother    Colitis Paternal Aunt    Ovarian cancer Maternal Grandmother     Social History   Socioeconomic History   Marital status: Divorced    Spouse name: Not on file   Number of children: 2   Years of  education: Not on file   Highest education level: Not on file  Occupational History   Occupation: Textiles  Tobacco Use   Smoking status: Every Day    Packs/day: 1.00    Years: 40.00    Pack years: 40.00    Types: Cigarettes   Smokeless tobacco: Never  Vaping Use   Vaping Use: Never used  Substance and Sexual Activity   Alcohol use: Yes    Comment: 2 x per week   Drug use: No   Sexual activity: Not on file  Other Topics Concern   Not on file  Social History Narrative   ** Merged History Encounter **   Patient is right handed.   Patient drinks 3 cups caffeine daily.   Working on 2nd shift now at her job.  08-15-14          Social Determinants of Radio broadcast assistant Strain: Not on [employer] Insecurity: Not on file  Transportation Needs: Not on file  Physical Activity: Not on file  Stress: Not on file  Social Connections: Not on file  Intimate Partner Violence: Not on file    Outpatient Medications Prior to Visit  Medication Sig Dispense Refill   albuterol (VENTOLIN HFA) 108 (90 Base) MCG/ACT inhaler Inhale 2 puffs into the lungs every 6 (six) hours as needed for wheezing  or shortness of breath. 8 g 0   alprazolam (XANAX) 2 MG tablet Take 2 mg by mouth at bedtime as needed.     amLODipine (NORVASC) 5 MG tablet Take 1 tablet (5 mg total) by mouth daily. 90 tablet 0   bisoprolol (ZEBETA) 5 MG tablet Take 1 tablet (5 mg total) by mouth daily. 30 tablet 6   Budeson-Glycopyrrol-Formoterol (BREZTRI AEROSPHERE) 160-9-4.8 MCG/ACT AERO Inhale 2 puffs into the lungs 2 (two) times daily. 10.7 g 11   esomeprazole (NEXIUM) 40 MG capsule Take 40 mg by mouth daily at 12 noon.     famotidine (PEPCID) 20 MG tablet One at bedtime 30 tablet 11   gabapentin (NEURONTIN) 300 MG capsule Take 1 capsule (300 mg total) by mouth at bedtime. 30 capsule 1   ketoconazole (NIZORAL) 2 % shampoo Apply 1 application topically as needed.     KETOCONAZOLE, TOPICAL, (NIZORAL A-D) 1 % SHAM Nizoral  A-D 1 % shampoo  APPLY SHAMPOO BY TOPICAL ROUTE EVERY 3 DAYS LATHER, RINSE, AND REPEAT     losartan (COZAAR) 50 MG tablet Take 1 tablet (50 mg total) by mouth daily. 90 tablet 1   Magnesium 500 MG TABS Take 1 tablet by mouth daily.     Melatonin 3 MG TABS Take 3 mg by mouth at bedtime as needed (sleep).      ondansetron (ZOFRAN) 4 MG tablet Take 1 tablet (4 mg total) by mouth every 8 (eight) hours as needed for nausea or vomiting. 30 tablet 0   potassium chloride SA (KLOR-CON M20) 20 MEQ tablet Take 1 tablet (20 mEq total) by mouth daily. 90 tablet 3   promethazine (PHENERGAN) 25 MG tablet Take 1 tablet (25 mg total) by mouth every 8 (eight) hours as needed for nausea. 60 tablet 3   rosuvastatin (CRESTOR) 20 MG tablet Take 1 tablet (20 mg total) by mouth daily. 90 tablet 0   No facility-administered medications prior to visit.    Allergies  Allergen Reactions   Codeine Itching   Sulfa Antibiotics Itching   Spironolactone     Electrolyte imbalance    Review of Systems  Constitutional:  Negative for appetite change, fatigue and fever.  HENT:  Positive for congestion, postnasal drip, rhinorrhea, sinus pressure, sinus pain and sore throat. Negative for ear pain.   Eyes:  Negative for pain.  Respiratory:  Positive for cough and shortness of breath. Negative for chest tightness and wheezing.   Cardiovascular:  Negative for chest pain and palpitations.  Gastrointestinal:  Negative for abdominal pain, constipation, diarrhea, nausea and vomiting.  Endocrine: Negative.   Genitourinary:  Negative for dysuria and hematuria.  Musculoskeletal:  Negative for arthralgias, back pain, joint swelling and myalgias.  Skin:  Negative for rash.  Allergic/Immunologic: Positive for environmental allergies.  Neurological:  Positive for headaches. Negative for dizziness and weakness.  Hematological: Negative.   Psychiatric/Behavioral:  Negative for dysphoric mood. The patient is not nervous/anxious.        Objective:    Physical Exam Vitals reviewed.  Constitutional:      Appearance: Normal appearance.  HENT:     Right Ear: Tympanic membrane is erythematous.     Left Ear: Tympanic membrane is erythematous.     Nose: Congestion and rhinorrhea present.     Mouth/Throat:     Mouth: Mucous membranes are dry.     Pharynx: Posterior oropharyngeal erythema present.     Comments: Thrush noted to tongue Cardiovascular:     Rate and Rhythm:  Normal rate and regular rhythm.     Pulses: Normal pulses.     Heart sounds: Normal heart sounds.  Pulmonary:     Effort: Pulmonary effort is normal.     Comments: Diminished lung sounds in all lobes Abdominal:     Palpations: Abdomen is soft.  Musculoskeletal:        General: Normal range of motion.     Cervical back: Neck supple.  Skin:    General: Skin is warm and dry.     Capillary Refill: Capillary refill takes less than 2 seconds.  Neurological:     General: No focal deficit present.     Mental Status: She is alert and oriented to person, place, and time.  Psychiatric:        Mood and Affect: Mood normal.        Behavior: Behavior normal.        Thought Content: Thought content normal.        Judgment: Judgment normal.    BP 118/68 (BP Location: Left Arm, Patient Position: Sitting)   Pulse 70   Temp 97.8 F (36.6 C) (Temporal)   Ht 5' 3"  (1.6 m)   Wt 151 lb (68.5 kg)   SpO2 97%   BMI 26.75 kg/m  Wt Readings from Last 3 Encounters:  11/05/20 151 lb (68.5 kg)  10/16/20 153 lb 12.8 oz (69.8 kg)  07/10/20 148 lb (67.1 kg)    Health Maintenance Due  Topic Date Due   Hepatitis C Screening  Never done   TETANUS/TDAP  Never done   Zoster Vaccines- Shingrix (1 of 2) Never done   MAMMOGRAM  Never done   INFLUENZA VACCINE  Never done     Lab Results  Component Value Date   TSH 1.860 07/11/2020   Lab Results  Component Value Date   WBC 7.7 10/16/2020   HGB 12.0 10/16/2020   HCT 35.6 10/16/2020   MCV 90 10/16/2020   PLT 356  10/16/2020   Lab Results  Component Value Date   NA 143 10/16/2020   K 3.4 (L) 10/16/2020   CO2 27 10/16/2020   GLUCOSE 105 (H) 10/16/2020   BUN 12 10/16/2020   CREATININE 0.86 10/16/2020   BILITOT <0.2 10/16/2020   ALKPHOS 117 10/16/2020   AST 20 10/16/2020   ALT 13 10/16/2020   PROT 6.2 10/16/2020   ALBUMIN 4.0 10/16/2020   CALCIUM 8.0 (L) 10/16/2020   ANIONGAP 11 03/09/2018   EGFR 79 10/16/2020   GFR 75.48 05/24/2019   Lab Results  Component Value Date   CHOL 135 10/16/2020   Lab Results  Component Value Date   HDL 37 (L) 10/16/2020   Lab Results  Component Value Date   LDLCALC 71 10/16/2020   Lab Results  Component Value Date   TRIG 158 (H) 10/16/2020   Lab Results  Component Value Date   CHOLHDL 3.6 10/16/2020          Assessment & Plan:    1. Other acute recurrent sinusitis - amoxicillin (AMOXIL) 875 MG tablet; Take 1 tablet (875 mg total) by mouth 2 (two) times daily for 10 days.  Dispense: 20 tablet; Refill: 0  2. Chronic allergic rhinitis -continue OTC antihistamine  3. Sore throat - POC COVID-19 BinaxNow - POCT rapid strep A  4. Cough - POC COVID-19 BinaxNow -Use Breztri inhaler daily, rinse mouth afterwards  5. Sinus congestion -Continue OTC antihistamine  6. COPD GOLD II/ active smoker  - Budeson-Glycopyrrol-Formoterol (BREZTRI AEROSPHERE)  160-9-4.8 MCG/ACT AERO; Inhale 2 puffs into the lungs 2 (two) times daily.  Dispense: 10.7 g; Refill: 0 - Budeson-Glycopyrrol-Formoterol (BREZTRI AEROSPHERE) 160-9-4.8 MCG/ACT AERO; Inhale 2 puffs into the lungs 2 (two) times daily.  Dispense: 10.7 g; Refill: 11  7. Thrush - nystatin (MYCOSTATIN) 100000 UNIT/ML suspension; Take 5 mLs (500,000 Units total) by mouth 4 (four) times daily.  Dispense: 60 mL; Refill: 0 - fluconazole (DIFLUCAN) 150 MG tablet; Take 1 tablet (150 mg total) by mouth once for 1 dose.  Dispense: 1 tablet; Refill: 0  8. Primary osteoarthritis of both feet - meloxicam (MOBIC)  7.5 MG tablet; Take 1 tablet (7.5 mg total) by mouth daily.  Dispense: 30 tablet; Refill: 0   Use Breztri inhaler daily, rinse mouth afterwards Use Albuterol inhaler as needed Use Nystatin mouth wash four times daily Begin Mobic 7.5 mg for bilateral foot osteoarthritis, with food Amoxicillin twice daily for 10 days Follow-up as needed    Follow-up: As needed   I,Lauren M Auman,acting as a Education administrator for [employer], NP.,have documented all relevant documentation on the behalf of Rip Harbour, NP,as directed by  Rip Harbour, NP while in the presence of Rip Harbour, NP.    I, Rip Harbour, NP, have reviewed all documentation for this visit. The documentation on 11/05/20 for the exam, diagnosis, procedures, and orders are all accurate and complete.   Signed, Jerrell Belfast, DNP

## 2020-12-16 ENCOUNTER — Ambulatory Visit (INDEPENDENT_AMBULATORY_CARE_PROVIDER_SITE_OTHER): Payer: 59 | Admitting: Legal Medicine

## 2020-12-16 ENCOUNTER — Encounter: Payer: Self-pay | Admitting: Legal Medicine

## 2020-12-16 ENCOUNTER — Encounter: Payer: Self-pay | Admitting: Nurse Practitioner

## 2020-12-16 VITALS — BP 130/80 | HR 94 | Temp 97.2°F | Resp 16 | Ht 63.0 in | Wt 148.0 lb

## 2020-12-16 DIAGNOSIS — R051 Acute cough: Secondary | ICD-10-CM

## 2020-12-16 DIAGNOSIS — Z720 Tobacco use: Secondary | ICD-10-CM | POA: Diagnosis not present

## 2020-12-16 DIAGNOSIS — J449 Chronic obstructive pulmonary disease, unspecified: Secondary | ICD-10-CM | POA: Diagnosis not present

## 2020-12-16 LAB — POC COVID19 BINAXNOW: SARS Coronavirus 2 Ag: NEGATIVE

## 2020-12-16 MED ORDER — AMOXICILLIN-POT CLAVULANATE 875-125 MG PO TABS: 1.0000 | ORAL_TABLET | Freq: Two times a day (BID) | ORAL | 0 refills | Status: DC

## 2020-12-16 MED ORDER — TRIAMCINOLONE ACETONIDE 40 MG/ML IJ SUSP
80.0000 mg | Freq: Once | INTRAMUSCULAR | Status: AC
Start: 2020-12-16 — End: 2020-12-16
  Administered 2020-12-16: 80 mg via INTRAMUSCULAR

## 2020-12-16 MED ORDER — BENZONATATE 100 MG PO CAPS
100.0000 mg | ORAL_CAPSULE | Freq: Two times a day (BID) | ORAL | 3 refills | Status: DC | PRN
Start: 2020-12-16 — End: 2021-01-15

## 2020-12-16 MED ORDER — TRELEGY ELLIPTA 100-62.5-25 MCG/ACT IN AEPB: 1.0000 | INHALATION_SPRAY | Freq: Every day | RESPIRATORY_TRACT | 11 refills | Status: DC

## 2020-12-16 NOTE — Progress Notes (Signed)
Acute Office Visit  Subjective:    Patient ID: Mary Franco, female    DOB: [birth year], 55 y.o.   MRN: [id number]  Chief Complaint  Patient presents with   Cough   COPD    HPI: Patient is in today for copd with exacerbation.  Sick since Thursday, spirometry shows mild COPD. She Is off inhaler.  She works at [employer].  Past Medical History:  Diagnosis Date   Alcohol abuse    Anxiety    ASD (atrial septal defect) 07/19/2018   Asthma    Cervical strain 04/16/2014   Cervicogenic headache 04/16/2014   Chronic combined systolic and diastolic CHF (congestive heart failure) ([place]) 03/25/2018   Echo 02/2018: EF 40-45 // Echo 07/2018:  EF 55-60, trivial AI, small secundum ASD (L>R shunting).   COPD (chronic obstructive pulmonary disease) (HCC)    Fibromyalgia    Migraines     Past Surgical History:  Procedure Laterality Date   APPENDECTOMY     KNEE SURGERY Left    RIGHT/LEFT HEART CATH AND CORONARY ANGIOGRAPHY N/A 03/08/2018   Procedure: RIGHT/LEFT HEART CATH AND CORONARY ANGIOGRAPHY;  Surgeon: Lorretta Harp, MD;  Location: [facility];  Service: Cardiovascular;  Laterality: N/A;   TUBAL LIGATION      Family History  Problem Relation Age of Onset   Cancer Mother    Colitis Paternal Aunt    Ovarian cancer Maternal Grandmother     Social History   Socioeconomic History   Marital status: Divorced    Spouse name: Not on file   Number of children: 2   Years of education: Not on file   Highest education level: Not on file  Occupational History   Occupation: Textiles  Tobacco Use   Smoking status: Every Day    Packs/day: 1.50    Years: 40.00    Pack years: 60.00    Types: Cigarettes   Smokeless tobacco: Never  Vaping Use   Vaping Use: Never used  Substance and Sexual Activity   Alcohol use: Yes    Comment: 2 x per week   Drug use: No   Sexual activity: Not Currently  Other Topics Concern   Not on file  Social History Narrative   ** Merged History  Encounter **   Patient is right handed.   Patient drinks 3 cups caffeine daily.   Working on 2nd shift now at her job.  08-15-14          Social Determinants of Radio broadcast assistant Strain: Not on file  Food Insecurity: Not on file  Transportation Needs: Not on file  Physical Activity: Not on file  Stress: Not on file  Social Connections: Not on file  Intimate Partner Violence: Not on file    Outpatient Medications Prior to Visit  Medication Sig Dispense Refill   albuterol (VENTOLIN HFA) 108 (90 Base) MCG/ACT inhaler Inhale 2 puffs into the lungs every 6 (six) hours as needed for wheezing or shortness of breath. 8 g 3   alprazolam (XANAX) 2 MG tablet Take 2 mg by mouth at bedtime as needed.     amLODipine (NORVASC) 5 MG tablet Take 1 tablet (5 mg total) by mouth daily. 90 tablet 0   bisoprolol (ZEBETA) 5 MG tablet Take 1 tablet (5 mg total) by mouth daily. 30 tablet 6   esomeprazole (NEXIUM) 40 MG capsule Take 40 mg by mouth daily at 12 noon.     famotidine (PEPCID) 20 MG tablet  One at bedtime 30 tablet 11   gabapentin (NEURONTIN) 300 MG capsule Take 1 capsule (300 mg total) by mouth at bedtime. 30 capsule 1   ketoconazole (NIZORAL) 2 % shampoo Apply 1 application topically as needed.     KETOCONAZOLE, TOPICAL, (NIZORAL A-D) 1 % SHAM Nizoral A-D 1 % shampoo  APPLY SHAMPOO BY TOPICAL ROUTE EVERY 3 DAYS LATHER, RINSE, AND REPEAT     losartan (COZAAR) 50 MG tablet Take 1 tablet (50 mg total) by mouth daily. 90 tablet 1   Magnesium 500 MG TABS Take 1 tablet by mouth daily.     Melatonin 3 MG TABS Take 3 mg by mouth at bedtime as needed (sleep).      meloxicam (MOBIC) 7.5 MG tablet Take 1 tablet (7.5 mg total) by mouth daily. 30 tablet 0   nystatin (MYCOSTATIN) 100000 UNIT/ML suspension Take 5 mLs (500,000 Units total) by mouth 4 (four) times daily. 60 mL 0   ondansetron (ZOFRAN) 4 MG tablet Take 1 tablet (4 mg total) by mouth every 8 (eight) hours as needed for nausea or  vomiting. 30 tablet 0   potassium chloride SA (KLOR-CON M20) 20 MEQ tablet Take 1 tablet (20 mEq total) by mouth daily. 90 tablet 3   promethazine (PHENERGAN) 25 MG tablet Take 1 tablet (25 mg total) by mouth every 8 (eight) hours as needed for nausea. 60 tablet 3   rosuvastatin (CRESTOR) 20 MG tablet Take 1 tablet (20 mg total) by mouth daily. 90 tablet 0   Budeson-Glycopyrrol-Formoterol (BREZTRI AEROSPHERE) 160-9-4.8 MCG/ACT AERO Inhale 2 puffs into the lungs 2 (two) times daily. 10.7 g 0   Budeson-Glycopyrrol-Formoterol (BREZTRI AEROSPHERE) 160-9-4.8 MCG/ACT AERO Inhale 2 puffs into the lungs 2 (two) times daily. 10.7 g 11   No facility-administered medications prior to visit.    Allergies  Allergen Reactions   Codeine Itching   Sulfa Antibiotics Itching   Spironolactone     Electrolyte imbalance    Review of Systems  Constitutional:  Positive for chills, fatigue and fever.  HENT:  Positive for congestion and ear pain (Left ear pain). Negative for sinus pain and sore throat.   Respiratory:  Positive for cough, chest tightness, shortness of breath and wheezing.   Cardiovascular:  Negative for chest pain.  Musculoskeletal:  Positive for myalgias.  Neurological:  Negative for headaches.      Objective:    Physical Exam Vitals reviewed.  Constitutional:      General: She is not in acute distress.    Appearance: Normal appearance.  HENT:     Head: Normocephalic.     Right Ear: Tympanic membrane, ear canal and external ear normal.     Left Ear: Tympanic membrane, ear canal and external ear normal.     Mouth/Throat:     Mouth: Mucous membranes are moist.     Pharynx: Oropharynx is clear.  Eyes:     Extraocular Movements: Extraocular movements intact.     Conjunctiva/sclera: Conjunctivae normal.     Pupils: Pupils are equal, round, and reactive to light.  Cardiovascular:     Rate and Rhythm: Normal rate and regular rhythm.     Pulses: Normal pulses.     Heart sounds: Normal  heart sounds. No murmur heard.   No gallop.  Pulmonary:     Effort: Pulmonary effort is normal. No respiratory distress.     Breath sounds: Wheezing and rhonchi present.  Abdominal:     General: Abdomen is flat. Bowel sounds are normal.  There is no distension.     Palpations: Abdomen is soft.     Tenderness: There is no abdominal tenderness.  Musculoskeletal:     Cervical back: Normal range of motion.  Skin:    General: Skin is warm.     Capillary Refill: Capillary refill takes less than 2 seconds.  Neurological:     General: No focal deficit present.     Mental Status: She is alert and oriented to person, place, and time. Mental status is at baseline.  Psychiatric:        Mood and Affect: Mood normal.    BP 130/80   Pulse 94   Temp (!) 97.2 F (36.2 C)   Resp 16   Ht _0  (1.6 m)   Wt 148 lb (67.1 kg)   SpO2 95%   BMI 26.22 kg/m  Wt Readings from Last 3 Encounters:  12/16/20 148 lb (67.1 kg)  11/05/20 151 lb (68.5 kg)  10/16/20 153 lb 12.8 oz (69.8 kg)    Health Maintenance Due  Topic Date Due   Pneumococcal Vaccine 38-44 Years old (1 - PCV) Never done   Hepatitis C Screening  Never done   TETANUS/TDAP  Never done   Zoster Vaccines- Shingrix (1 of 2) Never done   MAMMOGRAM  Never done   INFLUENZA VACCINE  Never done    There are no preventive care reminders to display for this patient.   Lab Results  Component Value Date   TSH 1.860 07/11/2020   Lab Results  Component Value Date   WBC 7.7 10/16/2020   HGB 12.0 10/16/2020   HCT 35.6 10/16/2020   MCV 90 10/16/2020   PLT 356 10/16/2020   Lab Results  Component Value Date   NA 143 10/16/2020   K 3.4 (L) 10/16/2020   CO2 27 10/16/2020   GLUCOSE 105 (H) 10/16/2020   BUN 12 10/16/2020   CREATININE 0.86 10/16/2020   BILITOT <0.2 10/16/2020   ALKPHOS 117 10/16/2020   AST 20 10/16/2020   ALT 13 10/16/2020   PROT 6.2 10/16/2020   ALBUMIN 4.0 10/16/2020   CALCIUM 8.0 (L) 10/16/2020   ANIONGAP 11  03/09/2018   EGFR 79 10/16/2020   GFR 75.48 05/24/2019   Lab Results  Component Value Date   CHOL 135 10/16/2020   Lab Results  Component Value Date   HDL 37 (L) 10/16/2020   Lab Results  Component Value Date   LDLCALC 71 10/16/2020   Lab Results  Component Value Date   TRIG 158 (H) 10/16/2020   Lab Results  Component Value Date   CHOLHDL 3.6 10/16/2020   No results found for: HGBA1C     Assessment & Plan:   Problem List Items Addressed This Visit       Respiratory   COPD GOLD II/ active smoker    Relevant Medications   Fluticasone-Umeclidin-Vilant (TRELEGY ELLIPTA) 100-62.5-25 MCG/ACT AEPB   benzonatate (TESSALON) 100 MG capsule   amoxicillin-clavulanate (AUGMENTIN) 875-125 MG tablet Copd with exacerbation, treat with trelegy and augmentin     Other   Tobacco abuse Individual plan was given to patient based on exam, history and other tests and using evidence based criteria for care for smoking cessation.  We discussed behavioral changes to help cessation and offered counseling to aid in quitting.  Medical consequences of tobacco use were explained.    Other Visit Diagnoses     Acute cough    -  Primary   Relevant Medications  amoxicillin-clavulanate (AUGMENTIN) 875-125 MG tablet   Other Relevant Orders   POC COVID-19 Negative Covid, patient has COPD with exacerbation      Meds ordered this encounter  Medications   Fluticasone-Umeclidin-Vilant (TRELEGY ELLIPTA) 100-62.5-25 MCG/ACT AEPB    Sig: Inhale 1 puff into the lungs daily.    Dispense:  1 each    Refill:  11   triamcinolone acetonide (KENALOG-40) injection 80 mg   benzonatate (TESSALON) 100 MG capsule    Sig: Take 1 capsule (100 mg total) by mouth 2 (two) times daily as needed for cough.    Dispense:  20 capsule    Refill:  3   amoxicillin-clavulanate (AUGMENTIN) 875-125 MG tablet    Sig: Take 1 tablet by mouth 2 (two) times daily.    Dispense:  20 tablet    Refill:  0     Orders Placed  This Encounter  Procedures   POC COVID-19      Follow-up: No follow-ups on file.  An After Visit Summary was printed and given to the patient.  Reinaldo Meeker, MD [facility] [phone]

## 2020-12-19 ENCOUNTER — Other Ambulatory Visit: Payer: Self-pay | Admitting: Nurse Practitioner

## 2020-12-19 DIAGNOSIS — I428 Other cardiomyopathies: Secondary | ICD-10-CM

## 2021-01-15 NOTE — Progress Notes (Deleted)
Acute Office Visit  Subjective:    Patient ID: Mary Franco, female    DOB: 01-Nov-1965, 55 y.o.   MRN: [id number]  Chief Complaint  Patient presents with   Gastroesophageal Reflux   Hypertension    Gastroesophageal Reflux She complains of coughing and a sore throat. She reports no abdominal pain, no chest pain, no nausea or no wheezing. Associated symptoms include fatigue.  Hypertension Pertinent negatives include no chest pain, headaches, palpitations or shortness of breath.  Patient is in today for ***  Past Medical History:  Diagnosis Date   Alcohol abuse    Anxiety    ASD (atrial septal defect) 07/19/2018   Asthma    Cervical strain 04/16/2014   Cervicogenic headache 04/16/2014   Chronic combined systolic and diastolic CHF (congestive heart failure) ([place]) 03/25/2018   Echo 02/2018: EF 40-45 // Echo 07/2018:  EF 55-60, trivial AI, small secundum ASD (L>R shunting).   COPD (chronic obstructive pulmonary disease) (HCC)    Fibromyalgia    Migraines     Past Surgical History:  Procedure Laterality Date   APPENDECTOMY     KNEE SURGERY Left    RIGHT/LEFT HEART CATH AND CORONARY ANGIOGRAPHY N/A 03/08/2018   Procedure: RIGHT/LEFT HEART CATH AND CORONARY ANGIOGRAPHY;  Surgeon: Lorretta Harp, MD;  Location: [facility];  Service: Cardiovascular;  Laterality: N/A;   TUBAL LIGATION      Family History  Problem Relation Age of Onset   Cancer Mother    Colitis Paternal Aunt    Ovarian cancer Maternal Grandmother     Social History   Socioeconomic History   Marital status: Divorced    Spouse name: Not on file   Number of children: 2   Years of education: Not on file   Highest education level: Not on file  Occupational History   Occupation: Textiles  Tobacco Use   Smoking status: Every Day    Packs/day: 1.50    Years: 40.00    Pack years: 60.00    Types: Cigarettes   Smokeless tobacco: Never  Vaping Use   Vaping Use: Never used  Substance and Sexual  Activity   Alcohol use: Yes    Comment: 2 x per week   Drug use: No   Sexual activity: Not Currently  Other Topics Concern   Not on file  Social History Narrative   ** Merged History Encounter **   Patient is right handed.   Patient drinks 3 cups caffeine daily.   Working on 2nd shift now at her job.  08-15-14          Social Determinants of Radio broadcast assistant Strain: Not on file  Food Insecurity: Not on file  Transportation Needs: Not on file  Physical Activity: Not on file  Stress: Not on file  Social Connections: Not on file  Intimate Partner Violence: Not on file    Outpatient Medications Prior to Visit  Medication Sig Dispense Refill   albuterol (VENTOLIN HFA) 108 (90 Base) MCG/ACT inhaler Inhale 2 puffs into the lungs every 6 (six) hours as needed for wheezing or shortness of breath. 8 g 3   alprazolam (XANAX) 2 MG tablet Take 2 mg by mouth at bedtime as needed.     amLODipine (NORVASC) 5 MG tablet Take 1 tablet by mouth once daily 90 tablet 0   bisoprolol (ZEBETA) 5 MG tablet Take 1 tablet by mouth once daily 90 tablet 0   esomeprazole (NEXIUM) 40 MG  capsule Take 40 mg by mouth daily at 12 noon.     famotidine (PEPCID) 20 MG tablet One at bedtime 30 tablet 11   Fluticasone-Umeclidin-Vilant (TRELEGY ELLIPTA) 100-62.5-25 MCG/ACT AEPB Inhale 1 puff into the lungs daily. 1 each 11   gabapentin (NEURONTIN) 300 MG capsule Take 1 capsule (300 mg total) by mouth at bedtime. 30 capsule 1   ketoconazole (NIZORAL) 2 % shampoo Apply 1 application topically as needed.     KETOCONAZOLE, TOPICAL, (NIZORAL A-D) 1 % SHAM Nizoral A-D 1 % shampoo  APPLY SHAMPOO BY TOPICAL ROUTE EVERY 3 DAYS LATHER, RINSE, AND REPEAT     losartan (COZAAR) 50 MG tablet Take 1 tablet (50 mg total) by mouth daily. 90 tablet 1   Magnesium 500 MG TABS Take 1 tablet by mouth daily.     Melatonin 3 MG TABS Take 3 mg by mouth at bedtime as needed (sleep).      meloxicam (MOBIC) 7.5 MG tablet Take 1  tablet (7.5 mg total) by mouth daily. 30 tablet 0   nystatin (MYCOSTATIN) 100000 UNIT/ML suspension Take 5 mLs (500,000 Units total) by mouth 4 (four) times daily. 60 mL 0   ondansetron (ZOFRAN) 4 MG tablet Take 1 tablet (4 mg total) by mouth every 8 (eight) hours as needed for nausea or vomiting. 30 tablet 0   potassium chloride SA (KLOR-CON M20) 20 MEQ tablet Take 1 tablet (20 mEq total) by mouth daily. 90 tablet 3   promethazine (PHENERGAN) 25 MG tablet Take 1 tablet (25 mg total) by mouth every 8 (eight) hours as needed for nausea. 60 tablet 3   rosuvastatin (CRESTOR) 20 MG tablet Take 1 tablet by mouth once daily 90 tablet 0   amoxicillin-clavulanate (AUGMENTIN) 875-125 MG tablet Take 1 tablet by mouth 2 (two) times daily. 20 tablet 0   benzonatate (TESSALON) 100 MG capsule Take 1 capsule (100 mg total) by mouth 2 (two) times daily as needed for cough. 20 capsule 3   No facility-administered medications prior to visit.    Allergies  Allergen Reactions   Codeine Itching   Sulfa Antibiotics Itching   Spironolactone     Electrolyte imbalance    Review of Systems  Constitutional:  Positive for chills and fatigue. Negative for fever.  HENT:  Positive for rhinorrhea and sore throat. Negative for congestion, ear pain and sinus pressure.   Eyes:  Negative for pain.  Respiratory:  Positive for cough. Negative for chest tightness, shortness of breath and wheezing.   Cardiovascular:  Negative for chest pain and palpitations.  Gastrointestinal:  Negative for abdominal pain, constipation, diarrhea, nausea and vomiting.  Genitourinary:  Negative for dysuria and hematuria.  Musculoskeletal:  Negative for arthralgias, back pain, joint swelling and myalgias.  Skin:  Negative for rash.  Neurological:  Negative for dizziness, weakness and headaches.  Psychiatric/Behavioral:  Negative for dysphoric mood. The patient is not nervous/anxious.       Objective:    Physical Exam Vitals reviewed.   Constitutional:      Appearance: Normal appearance. She is normal weight.  Neck:     Vascular: No carotid bruit.  Cardiovascular:     Rate and Rhythm: Normal rate and regular rhythm.     Pulses: Normal pulses.     Heart sounds: Normal heart sounds.  Pulmonary:     Effort: Pulmonary effort is normal. No respiratory distress.     Breath sounds: Normal breath sounds.  Abdominal:     General: Abdomen is flat. Bowel  sounds are normal.     Palpations: Abdomen is soft.     Tenderness: There is no abdominal tenderness.  Neurological:     Mental Status: She is alert and oriented to person, place, and time.  Psychiatric:        Mood and Affect: Mood normal.        Behavior: Behavior normal.    There were no vitals taken for this visit. Wt Readings from Last 3 Encounters:  12/16/20 148 lb (67.1 kg)  11/05/20 151 lb (68.5 kg)  10/16/20 153 lb 12.8 oz (69.8 kg)    Health Maintenance Due  Topic Date Due   Pneumococcal Vaccine 51-82 Years old (1 - PCV) Never done   Hepatitis C Screening  Never done   TETANUS/TDAP  Never done   Zoster Vaccines- Shingrix (1 of 2) Never done   MAMMOGRAM  Never done   INFLUENZA VACCINE  Never done    There are no preventive care reminders to display for this patient.   Lab Results  Component Value Date   TSH 1.860 07/11/2020   Lab Results  Component Value Date   WBC 7.7 10/16/2020   HGB 12.0 10/16/2020   HCT 35.6 10/16/2020   MCV 90 10/16/2020   PLT 356 10/16/2020   Lab Results  Component Value Date   NA 143 10/16/2020   K 3.4 (L) 10/16/2020   CO2 27 10/16/2020   GLUCOSE 105 (H) 10/16/2020   BUN 12 10/16/2020   CREATININE 0.86 10/16/2020   BILITOT <0.2 10/16/2020   ALKPHOS 117 10/16/2020   AST 20 10/16/2020   ALT 13 10/16/2020   PROT 6.2 10/16/2020   ALBUMIN 4.0 10/16/2020   CALCIUM 8.0 (L) 10/16/2020   ANIONGAP 11 03/09/2018   EGFR 79 10/16/2020   GFR 75.48 05/24/2019   Lab Results  Component Value Date   CHOL 135 10/16/2020    Lab Results  Component Value Date   HDL 37 (L) 10/16/2020   Lab Results  Component Value Date   LDLCALC 71 10/16/2020   Lab Results  Component Value Date   TRIG 158 (H) 10/16/2020   Lab Results  Component Value Date   CHOLHDL 3.6 10/16/2020   No results found for: HGBA1C       Assessment & Plan:   1. Essential hypertension ***  2. Gastroesophageal reflux disease without esophagitis ***    No orders of the defined types were placed in this encounter.   I,Lauren M Auman,acting as a Education administrator for [employer], NP.,have documented all relevant documentation on the behalf of Rip Harbour, NP,as directed by  Rip Harbour, NP while in the presence of Rip Harbour, NP.    Oleta Mouse, CMA

## 2021-01-17 ENCOUNTER — Encounter: Payer: Self-pay | Admitting: Nurse Practitioner

## 2021-01-17 ENCOUNTER — Ambulatory Visit (INDEPENDENT_AMBULATORY_CARE_PROVIDER_SITE_OTHER): Payer: 59 | Admitting: Nurse Practitioner

## 2021-01-17 VITALS — BP 158/90 | HR 64 | Temp 97.4°F | Ht 63.0 in | Wt 149.0 lb

## 2021-01-17 DIAGNOSIS — J018 Other acute sinusitis: Secondary | ICD-10-CM | POA: Diagnosis not present

## 2021-01-17 DIAGNOSIS — J449 Chronic obstructive pulmonary disease, unspecified: Secondary | ICD-10-CM

## 2021-01-17 DIAGNOSIS — R059 Cough, unspecified: Secondary | ICD-10-CM | POA: Diagnosis not present

## 2021-01-17 DIAGNOSIS — J101 Influenza due to other identified influenza virus with other respiratory manifestations: Secondary | ICD-10-CM | POA: Diagnosis not present

## 2021-01-17 DIAGNOSIS — K219 Gastro-esophageal reflux disease without esophagitis: Secondary | ICD-10-CM

## 2021-01-17 DIAGNOSIS — Z8619 Personal history of other infectious and parasitic diseases: Secondary | ICD-10-CM

## 2021-01-17 DIAGNOSIS — I10 Essential (primary) hypertension: Secondary | ICD-10-CM

## 2021-01-17 LAB — POCT INFLUENZA A/B
Influenza A, POC: POSITIVE — AB
Influenza B, POC: NEGATIVE

## 2021-01-17 LAB — POC COVID19 BINAXNOW: SARS Coronavirus 2 Ag: NEGATIVE

## 2021-01-17 MED ORDER — FLUCONAZOLE 150 MG PO TABS: 150.0000 mg | ORAL_TABLET | Freq: Once | ORAL | 0 refills | Status: AC

## 2021-01-17 MED ORDER — AMOXICILLIN 875 MG PO TABS
875.0000 mg | ORAL_TABLET | Freq: Two times a day (BID) | ORAL | 0 refills | Status: AC
Start: 2021-01-17 — End: 2021-01-27

## 2021-01-17 NOTE — Patient Instructions (Addendum)
Rest and push fluids No work for 5 days Follow-up as needed 4  Influenza, Adult Influenza is also called "the flu." It is an infection in the lungs, nose, and throat (respiratory tract). It spreads easily from person to person (is contagious). The flu causes symptoms that are like a cold, along with high fever and body aches. What are the causes? This condition is caused by the influenza virus. You can get the virus by: Breathing in droplets that are in the air after a person infected with the flu coughed or sneezed. Touching something that has the virus on it and then touching your mouth, nose, or eyes. What increases the risk? Certain things may make you more likely to get the flu. These include: Not washing your hands often. Having close contact with many people during cold and flu season. Touching your mouth, eyes, or nose without first washing your hands. Not getting a flu shot every year. You may have a higher risk for the flu, and serious problems, such as a lung infection (pneumonia), if you: Are older than 65. Are pregnant. Have a weakened disease-fighting system (immune system) because of a disease or because you are taking certain medicines. Have a long-term (chronic) condition, such as: Heart, kidney, or lung disease. Diabetes. Asthma. Have a liver disorder. Are very overweight (morbidly obese). Have anemia. What are the signs or symptoms? Symptoms usually begin suddenly and last 4-14 days. They may include: Fever and chills. Headaches, body aches, or muscle aches. Sore throat. Cough. Runny or stuffy (congested) nose. Feeling discomfort in your chest. Not wanting to eat as much as normal. Feeling weak or tired. Feeling dizzy. Feeling sick to your stomach or throwing up. How is this treated? If the flu is found early, you can be treated with antiviral medicine. This can help to reduce how bad the illness is and how long it lasts. This may be given by mouth or  through an IV tube. Taking care of yourself at home can help your symptoms get better. Your doctor may want you to: Take over-the-counter medicines. Drink plenty of fluids. The flu often goes away on its own. If you have very bad symptoms or other problems, you may be treated in a hospital. Follow these instructions at home:   Activity Rest as needed. Get plenty of sleep. Stay home from work or school as told by your doctor. Do not leave home until you do not have a fever for 24 hours without taking medicine. Leave home only to go to your doctor. Eating and drinking Take an ORS (oral rehydration solution). This is a drink that is sold at pharmacies and stores. Drink enough fluid to keep your pee pale yellow. Drink clear fluids in small amounts as you are able. Clear fluids include: Water. Ice chips. Fruit juice mixed with water. Low-calorie sports drinks. Eat bland foods that are easy to digest. Eat small amounts as you are able. These foods include: Bananas. Applesauce. Rice. Lean meats. Toast. Crackers. Do not eat or drink: Fluids that have a lot of sugar or caffeine. Alcohol. Spicy or fatty foods. General instructions Take over-the-counter and prescription medicines only as told by your doctor. Use a cool mist humidifier to add moisture to the air in your home. This can make it easier for you to breathe. When using a cool mist humidifier, clean it daily. Empty water and replace with clean water. Cover your mouth and nose when you cough or sneeze. Wash your hands with soap  and water often and for at least 20 seconds. This is also important after you cough or sneeze. If you cannot use soap and water, use alcohol-based hand sanitizer. Keep all follow-up visits. How is this prevented?  Get a flu shot every year. You may get the flu shot in late summer, fall, or winter. Ask your doctor when you should get your flu shot. Avoid contact with people who are sick during fall and  winter. This is cold and flu season. Contact a doctor if: You get new symptoms. You have: Chest pain. Watery poop (diarrhea). A fever. Your cough gets worse. You start to have more mucus. You feel sick to your stomach. You throw up. Get help right away if you: Have shortness of breath. Have trouble breathing. Have skin or nails that turn a bluish color. Have very bad pain or stiffness in your neck. Get a sudden headache. Get sudden pain in your face or ear. Cannot eat or drink without throwing up. These symptoms may represent a serious problem that is an emergency. Get medical help right away. Call your local emergency services (911 in the [place] Do not wait to see if the symptoms will go away. Do not drive yourself to the hospital. Summary Influenza is also called "the flu." It is an infection in the lungs, nose, and throat. It spreads easily from person to person. Take over-the-counter and prescription medicines only as told by your doctor. Getting a flu shot every year is the best way to not get the flu. This information is not intended to replace advice given to you by your health care provider. Make sure you discuss any questions you have with your health care provider. Document Revised: 09/22/2019 Document Reviewed: 09/22/2019 Elsevier Patient Education  [place].

## 2021-01-17 NOTE — Progress Notes (Signed)
Acute Office Visit  Subjective:    Patient ID: Mary Franco, female    DOB: 03-01-1965, 55 y.o.   MRN: [id number]  Chief Complaint  Patient presents with   Cough   Sore Throat     HPI: Mary Franco is a 55 year old Caucasian female that presents with URI symptoms of chills, fever, postnasal drip, rhinorrhea, and sore throat. Onset of symptoms was five days ago. Treatment has included Tylenol. States she was in close contact with her sister that has influenza. She is a long-time active cigarette smoker with a history COPD/asthma.    Past Medical History:  Diagnosis Date   Alcohol abuse    Anxiety    ASD (atrial septal defect) 07/19/2018   Asthma    Cervical strain 04/16/2014   Cervicogenic headache 04/16/2014   Chronic combined systolic and diastolic CHF (congestive heart failure) ([place]) 03/25/2018   Echo 02/2018: EF 40-45 // Echo 07/2018:  EF 55-60, trivial AI, small secundum ASD (L>R shunting).   COPD (chronic obstructive pulmonary disease) (HCC)    Fibromyalgia    Migraines     Past Surgical History:  Procedure Laterality Date   APPENDECTOMY     KNEE SURGERY Left    RIGHT/LEFT HEART CATH AND CORONARY ANGIOGRAPHY N/A 03/08/2018   Procedure: RIGHT/LEFT HEART CATH AND CORONARY ANGIOGRAPHY;  Surgeon: Lorretta Harp, MD;  Location: [facility];  Service: Cardiovascular;  Laterality: N/A;   TUBAL LIGATION      Family History  Problem Relation Age of Onset   Cancer Mother    Colitis Paternal Aunt    Ovarian cancer Maternal Grandmother     Social History   Socioeconomic History   Marital status: Divorced    Spouse name: Not on file   Number of children: 2   Years of education: Not on file   Highest education level: Not on file  Occupational History   Occupation: Textiles  Tobacco Use   Smoking status: Every Day    Packs/day: 1.50    Years: 40.00    Pack years: 60.00    Types: Cigarettes   Smokeless tobacco: Never  Vaping Use   Vaping Use: Never used   Substance and Sexual Activity   Alcohol use: Yes    Comment: 2 x per week   Drug use: No   Sexual activity: Not Currently  Other Topics Concern   Not on file  Social History Narrative   ** Merged History Encounter **   Patient is right handed.   Patient drinks 3 cups caffeine daily.   Working on 2nd shift now at her job.  08-15-14          Social Determinants of Radio broadcast assistant Strain: Not on file  Food Insecurity: Not on file  Transportation Needs: Not on file  Physical Activity: Not on file  Stress: Not on file  Social Connections: Not on file  Intimate Partner Violence: Not on file    Outpatient Medications Prior to Visit  Medication Sig Dispense Refill   albuterol (VENTOLIN HFA) 108 (90 Base) MCG/ACT inhaler Inhale 2 puffs into the lungs every 6 (six) hours as needed for wheezing or shortness of breath. 8 g 3   alprazolam (XANAX) 2 MG tablet Take 2 mg by mouth at bedtime as needed.     amLODipine (NORVASC) 5 MG tablet Take 1 tablet by mouth once daily 90 tablet 0   bisoprolol (ZEBETA) 5 MG tablet Take 1 tablet by mouth once  daily 90 tablet 0   ciprofloxacin (CILOXAN) 0.3 % ophthalmic solution SMARTSIG:2 Drop(s) In Eye(s) Every 6 Hours     esomeprazole (NEXIUM) 40 MG capsule Take 40 mg by mouth daily at 12 noon.     famotidine (PEPCID) 20 MG tablet One at bedtime 30 tablet 11   Fluticasone-Umeclidin-Vilant (TRELEGY ELLIPTA) 100-62.5-25 MCG/ACT AEPB Inhale 1 puff into the lungs daily. 1 each 11   gabapentin (NEURONTIN) 300 MG capsule Take 1 capsule (300 mg total) by mouth at bedtime. 30 capsule 1   ketoconazole (NIZORAL) 2 % shampoo Apply 1 application topically as needed.     KETOCONAZOLE, TOPICAL, (NIZORAL A-D) 1 % SHAM Nizoral A-D 1 % shampoo  APPLY SHAMPOO BY TOPICAL ROUTE EVERY 3 DAYS LATHER, RINSE, AND REPEAT     losartan (COZAAR) 50 MG tablet Take 1 tablet (50 mg total) by mouth daily. 90 tablet 1   Magnesium 500 MG TABS Take 1 tablet by mouth daily.      Melatonin 3 MG TABS Take 3 mg by mouth at bedtime as needed (sleep).      meloxicam (MOBIC) 7.5 MG tablet Take 1 tablet (7.5 mg total) by mouth daily. 30 tablet 0   nystatin (MYCOSTATIN) 100000 UNIT/ML suspension Take 5 mLs (500,000 Units total) by mouth 4 (four) times daily. 60 mL 0   ondansetron (ZOFRAN) 4 MG tablet Take 1 tablet (4 mg total) by mouth every 8 (eight) hours as needed for nausea or vomiting. 30 tablet 0   potassium chloride SA (KLOR-CON M20) 20 MEQ tablet Take 1 tablet (20 mEq total) by mouth daily. 90 tablet 3   promethazine (PHENERGAN) 25 MG tablet Take 1 tablet (25 mg total) by mouth every 8 (eight) hours as needed for nausea. 60 tablet 3   rosuvastatin (CRESTOR) 20 MG tablet Take 1 tablet by mouth once daily 90 tablet 0   amoxicillin-clavulanate (AUGMENTIN) 875-125 MG tablet Take 1 tablet by mouth 2 (two) times daily. 20 tablet 0   benzonatate (TESSALON) 100 MG capsule Take 1 capsule (100 mg total) by mouth 2 (two) times daily as needed for cough. 20 capsule 3   No facility-administered medications prior to visit.    Allergies  Allergen Reactions   Codeine Itching   Sulfa Antibiotics Itching   Spironolactone     Electrolyte imbalance    Review of Systems  Constitutional:  Positive for chills, fatigue and fever. Negative for appetite change.  HENT:  Positive for congestion, postnasal drip, rhinorrhea, sinus pressure, sinus pain, sneezing and sore throat.   Eyes: Negative.   Respiratory:  Positive for cough.   Cardiovascular: Negative.   Gastrointestinal:  Positive for diarrhea and nausea.  Endocrine: Negative.   Genitourinary: Negative.   Musculoskeletal:        Generalized body aches  Hematological: Negative.   Psychiatric/Behavioral: Negative.        Objective:    Physical Exam Vitals reviewed.  Constitutional:      Appearance: She is ill-appearing.  HENT:     Nose: Congestion and rhinorrhea present.     Mouth/Throat:     Pharynx: Posterior  oropharyngeal erythema present.  Cardiovascular:     Rate and Rhythm: Normal rate.     Heart sounds: Normal heart sounds.  Pulmonary:     Effort: Pulmonary effort is normal. No respiratory distress.     Breath sounds: No stridor. No wheezing, rhonchi or rales.     Comments: Diminished lung sounds in all fields Abdominal:  General: Bowel sounds are normal.     Palpations: Abdomen is soft.  Skin:    General: Skin is warm and dry.     Capillary Refill: Capillary refill takes less than 2 seconds.     Findings: Erythema present.  Neurological:     General: No focal deficit present.     Mental Status: She is alert and oriented to person, place, and time.  Psychiatric:        Mood and Affect: Mood normal.        Behavior: Behavior normal.   BP (!) 158/90 (BP Location: Right Arm, Patient Position: Sitting)   Pulse 64   Temp (!) 97.4 F (36.3 C) (Temporal)   Ht 5' 3"  (1.6 m)   Wt 149 lb (67.6 kg)   SpO2 96%   BMI 26.39 kg/m  Wt Readings from Last 3 Encounters:  01/17/21 149 lb (67.6 kg)  12/16/20 148 lb (67.1 kg)  11/05/20 151 lb (68.5 kg)    Health Maintenance Due  Topic Date Due   Pneumococcal Vaccine 98-24 Years old (1 - PCV) Never done   Hepatitis C Screening  Never done   TETANUS/TDAP  Never done   Zoster Vaccines- Shingrix (1 of 2) Never done   MAMMOGRAM  Never done   INFLUENZA VACCINE  Never done    Lab Results  Component Value Date   TSH 1.860 07/11/2020   Lab Results  Component Value Date   WBC 7.7 10/16/2020   HGB 12.0 10/16/2020   HCT 35.6 10/16/2020   MCV 90 10/16/2020   PLT 356 10/16/2020   Lab Results  Component Value Date   NA 143 10/16/2020   K 3.4 (L) 10/16/2020   CO2 27 10/16/2020   GLUCOSE 105 (H) 10/16/2020   BUN 12 10/16/2020   CREATININE 0.86 10/16/2020   BILITOT <0.2 10/16/2020   ALKPHOS 117 10/16/2020   AST 20 10/16/2020   ALT 13 10/16/2020   PROT 6.2 10/16/2020   ALBUMIN 4.0 10/16/2020   CALCIUM 8.0 (L) 10/16/2020   ANIONGAP  11 03/09/2018   EGFR 79 10/16/2020   GFR 75.48 05/24/2019   Lab Results  Component Value Date   CHOL 135 10/16/2020   Lab Results  Component Value Date   HDL 37 (L) 10/16/2020   Lab Results  Component Value Date   LDLCALC 71 10/16/2020   Lab Results  Component Value Date   TRIG 158 (H) 10/16/2020   Lab Results  Component Value Date   CHOLHDL 3.6 10/16/2020        Assessment & Plan:   1. Influenza A -rest and push fluids -continue Tylenol as directed -OTC medications as needed for symptoms -Rest and push fluids -No work for 5 days -Follow-up as needed  2. Acute non-recurrent sinusitis of other sinus - amoxicillin (AMOXIL) 875 MG tablet; Take 1 tablet (875 mg total) by mouth 2 (two) times daily for 10 days.  Dispense: 20 tablet; Refill: 0   3. COPD GOLD II/ active smoker-well controlled -continue Trelegy inhaler daily -continue Albuterol inhaler -seek emergency medical care for any severe shortness of breath or any other concerning symptoms   4. History of candidiasis of mouth -fluconazole (Diflucan) 150 mg one tablet by mouth after antibiotics  5. Cough, unspecified type - POC COVID-19 BinaxNow-NEGATIVE - POCT Influenza A/B-POSITIVE FLU A, NEGATIVE FLU B   Follow-up:As needed  I, Rip Harbour, NP, have reviewed all documentation for this visit. The documentation on 01/17/21 for the exam,  diagnosis, procedures, and orders are all accurate and complete.   Signed, Rip Harbour, NP

## 2021-01-21 ENCOUNTER — Telehealth: Payer: Self-pay

## 2021-01-21 ENCOUNTER — Other Ambulatory Visit: Payer: Self-pay | Admitting: Nurse Practitioner

## 2021-01-21 DIAGNOSIS — B370 Candidal stomatitis: Secondary | ICD-10-CM

## 2021-01-21 MED ORDER — FLUCONAZOLE 150 MG PO TABS: 150.0000 mg | ORAL_TABLET | Freq: Once | ORAL | 0 refills | Status: AC

## 2021-01-21 MED ORDER — NYSTATIN 100000 UNIT/ML MT SUSP: 5.0000 mL | Freq: Four times a day (QID) | OROMUCOSAL | 0 refills | Status: DC

## 2021-01-21 NOTE — Telephone Encounter (Signed)
Patient calling as she is on day 6 of antibiotic. Yeast has begun in mouth yesterday. Due to this patient took diflucan early yesterday. Yeast is not improving and pt is requesting mouth wash and second diflucan.   Royce Macadamia, [place] 01/21/21 2:02 PM

## 2021-03-04 ENCOUNTER — Telehealth: Payer: Self-pay

## 2021-03-04 NOTE — Telephone Encounter (Signed)
Patient left voicemail requesting a call back regarding paperwork that her employer is needing to be completed. Returned call to patient left voicemail informing her once paperwork is completed we will fax. Paperwork was received while provider was out of the office for holidays/illness. No specific timeframe was indicated on paperwork. Provider is in the process of completing paperwork will fax once completed.

## 2021-03-13 ENCOUNTER — Telehealth: Payer: Self-pay

## 2021-03-13 NOTE — Telephone Encounter (Signed)
Patient left a voicemail stating her paperwork was missing some information according her employer (for when she was out of work 01/17/2021-01/20/2021. Attempted to call patient back unable to leave voicemail. Per Larene Beach patients paperwork has been completed and placed up front, if patient is aware of what is incomplete and is willing to come by the office to advise [place] on what is missing, Larene Beach is willing to correct it.

## 2021-04-29 ENCOUNTER — Ambulatory Visit (INDEPENDENT_AMBULATORY_CARE_PROVIDER_SITE_OTHER): Payer: 59 | Admitting: Nurse Practitioner

## 2021-04-29 ENCOUNTER — Encounter: Payer: Self-pay | Admitting: Nurse Practitioner

## 2021-04-29 VITALS — BP 122/82 | HR 100 | Temp 97.2°F | Ht 62.0 in | Wt 145.0 lb

## 2021-04-29 DIAGNOSIS — R112 Nausea with vomiting, unspecified: Secondary | ICD-10-CM

## 2021-04-29 DIAGNOSIS — E559 Vitamin D deficiency, unspecified: Secondary | ICD-10-CM | POA: Diagnosis not present

## 2021-04-29 DIAGNOSIS — K219 Gastro-esophageal reflux disease without esophagitis: Secondary | ICD-10-CM

## 2021-04-29 DIAGNOSIS — J449 Chronic obstructive pulmonary disease, unspecified: Secondary | ICD-10-CM

## 2021-04-29 DIAGNOSIS — K529 Noninfective gastroenteritis and colitis, unspecified: Secondary | ICD-10-CM

## 2021-04-29 MED ORDER — ONDANSETRON HCL 4 MG PO TABS: 4.0000 mg | ORAL_TABLET | Freq: Three times a day (TID) | ORAL | 1 refills | Status: DC | PRN

## 2021-04-29 MED ORDER — TRELEGY ELLIPTA 100-62.5-25 MCG/ACT IN AEPB: 1.0000 | INHALATION_SPRAY | Freq: Every day | RESPIRATORY_TRACT | 11 refills | Status: DC

## 2021-04-29 MED ORDER — PANTOPRAZOLE SODIUM 40 MG PO TBEC: 40.0000 mg | DELAYED_RELEASE_TABLET | Freq: Every day | ORAL | 3 refills | Status: DC

## 2021-04-29 MED ORDER — VITAMIN D (ERGOCALCIFEROL) 1.25 MG (50000 UNIT) PO CAPS: 50000.0000 [IU] | ORAL_CAPSULE | ORAL | 2 refills | Status: DC

## 2021-04-29 NOTE — Progress Notes (Signed)
? ?Acute Office Visit ? ?Subjective:  ? ? Patient ID: Mary Franco, female    DOB: 01-26-1966, 56 y.o.   MRN: [id number] ? ?Chief Complaint  ?Patient presents with  ? Nausea  ? Diarrhea  ? ? ?HPI: ?Patient is in today for nausea,vomiting, diarrhea, and fatigue. Onset of symptoms was three days ago. Treatment has included pushing fluids, especially electrolyte drinks. States she has a decreased appetite. States the electrolyte drinks have caused her to have GERD symptoms. She has taken OTC generic Nexium without relief. Denies known exposure to ill contacts.  ?Mary Franco has vitamin D deficiency. Reports she has not taken Vit D prescription recently. Mary Franco has COPD. States she does not currently have Trelegy inhaler as prescribed.  ? ?Past Medical History:  ?Diagnosis Date  ? Alcohol abuse   ? Anxiety   ? ASD (atrial septal defect) 07/19/2018  ? Asthma   ? Cervical strain 04/16/2014  ? Cervicogenic headache 04/16/2014  ? Chronic combined systolic and diastolic CHF (congestive heart failure) ([facility]) 03/25/2018  ? Echo 02/2018: EF 40-45 // Echo 07/2018:  EF 55-60, trivial AI, small secundum ASD (L>R shunting).  ? COPD (chronic obstructive pulmonary disease) ([facility])   ? Fibromyalgia   ? Migraines   ? ? ?Past Surgical History:  ?Procedure Laterality Date  ? APPENDECTOMY    ? KNEE SURGERY Left   ? RIGHT/LEFT HEART CATH AND CORONARY ANGIOGRAPHY N/A 03/08/2018  ? Procedure: RIGHT/LEFT HEART CATH AND CORONARY ANGIOGRAPHY;  Surgeon: Lorretta Harp, MD;  Location: [facility];  Service: Cardiovascular;  Laterality: N/A;  ? TUBAL LIGATION    ? ? ?Family History  ?Problem Relation Age of Onset  ? Cancer Mother   ? Colitis Paternal Aunt   ? Ovarian cancer Maternal Grandmother   ? ? ?Social History  ? ?Socioeconomic History  ? Marital status: Divorced  ?  Spouse name: Not on file  ? Number of children: 2  ? Years of education: Not on file  ? Highest education level: Not on file  ?Occupational History  ? Occupation: Textiles   ?Tobacco Use  ? Smoking status: Every Day  ?  Packs/day: 1.50  ?  Years: 40.00  ?  Pack years: 60.00  ?  Types: Cigarettes  ? Smokeless tobacco: Never  ?Vaping Use  ? Vaping Use: Never used  ?Substance and Sexual Activity  ? Alcohol use: Yes  ?  Comment: 2 x per week  ? Drug use: No  ? Sexual activity: Not Currently  ?Other Topics Concern  ? Not on file  ?Social History Narrative  ? ** Merged History Encounter **  ? Patient is right handed.  ? Patient drinks 3 cups caffeine daily.  ? Working on 2nd shift now at her job.  08-15-14  ?   ?    ? ?Social Determinants of Health  ? ?Financial Resource Strain: Not on file  ?Food Insecurity: Not on file  ?Transportation Needs: Not on file  ?Physical Activity: Not on file  ?Stress: Not on file  ?Social Connections: Not on file  ?Intimate Partner Violence: Not on file  ? ? ?Outpatient Medications Prior to Visit  ?Medication Sig Dispense Refill  ? albuterol (VENTOLIN HFA) 108 (90 Base) MCG/ACT inhaler Inhale 2 puffs into the lungs every 6 (six) hours as needed for wheezing or shortness of breath. 8 g 3  ? alprazolam (XANAX) 2 MG tablet Take 2 mg by mouth at bedtime as needed.    ? amLODipine (NORVASC)  5 MG tablet Take 1 tablet by mouth once daily 90 tablet 0  ? bisoprolol (ZEBETA) 5 MG tablet Take 1 tablet by mouth once daily 90 tablet 0  ? ciprofloxacin (CILOXAN) 0.3 % ophthalmic solution SMARTSIG:2 Drop(s) In Eye(s) Every 6 Hours    ? esomeprazole (NEXIUM) 40 MG capsule Take 40 mg by mouth daily at 12 noon.    ? famotidine (PEPCID) 20 MG tablet One at bedtime 30 tablet 11  ? Fluticasone-Umeclidin-Vilant (TRELEGY ELLIPTA) 100-62.5-25 MCG/ACT AEPB Inhale 1 puff into the lungs daily. 1 each 11  ? gabapentin (NEURONTIN) 300 MG capsule Take 1 capsule (300 mg total) by mouth at bedtime. 30 capsule 1  ? ketoconazole (NIZORAL) 2 % shampoo Apply 1 application topically as needed.    ? KETOCONAZOLE, TOPICAL, (NIZORAL A-D) 1 % SHAM Nizoral A-D 1 % shampoo ? APPLY SHAMPOO BY TOPICAL  ROUTE EVERY 3 DAYS LATHER, RINSE, AND REPEAT    ? losartan (COZAAR) 50 MG tablet Take 1 tablet (50 mg total) by mouth daily. 90 tablet 1  ? Magnesium 500 MG TABS Take 1 tablet by mouth daily.    ? Melatonin 3 MG TABS Take 3 mg by mouth at bedtime as needed (sleep).     ? meloxicam (MOBIC) 7.5 MG tablet Take 1 tablet (7.5 mg total) by mouth daily. 30 tablet 0  ? nystatin (MYCOSTATIN) 100000 UNIT/ML suspension Take 5 mLs (500,000 Units total) by mouth 4 (four) times daily. 60 mL 0  ? ondansetron (ZOFRAN) 4 MG tablet Take 1 tablet (4 mg total) by mouth every 8 (eight) hours as needed for nausea or vomiting. 30 tablet 0  ? potassium chloride SA (KLOR-CON M20) 20 MEQ tablet Take 1 tablet (20 mEq total) by mouth daily. 90 tablet 3  ? promethazine (PHENERGAN) 25 MG tablet Take 1 tablet (25 mg total) by mouth every 8 (eight) hours as needed for nausea. 60 tablet 3  ? rosuvastatin (CRESTOR) 20 MG tablet Take 1 tablet by mouth once daily 90 tablet 0  ? ?No facility-administered medications prior to visit.  ? ? ?Allergies  ?Allergen Reactions  ? Codeine Itching  ? Sulfa Antibiotics Itching  ? Spironolactone   ?  Electrolyte imbalance  ? ? ?Review of Systems  ?Constitutional:  Positive for fatigue. Negative for chills and fever.  ?HENT:  Negative for congestion, ear pain, rhinorrhea, sinus pressure, sinus pain and sore throat.   ?Respiratory:  Positive for shortness of breath. Negative for cough.   ?Cardiovascular:  Negative for chest pain.  ?Gastrointestinal:  Positive for diarrhea, nausea and vomiting.  ?Neurological:  Positive for weakness. Negative for dizziness and headaches.  ? ?   ?Objective:  ?  ?Physical Exam ?Vitals reviewed.  ?Constitutional:   ?   Appearance: Normal appearance.  ?HENT:  ?   Head: Normocephalic.  ?   Right Ear: Tympanic membrane normal.  ?   Left Ear: Tympanic membrane normal.  ?   Nose: Nose normal.  ?   Mouth/Throat:  ?   Mouth: Mucous membranes are dry.  ?Cardiovascular:  ?   Rate and Rhythm:  Normal rate and regular rhythm.  ?   Pulses: Normal pulses.  ?   Heart sounds: Normal heart sounds.  ?Pulmonary:  ?   Effort: Pulmonary effort is normal.  ?   Breath sounds: Normal breath sounds.  ?Abdominal:  ?   General: Bowel sounds are normal.  ?   Palpations: Abdomen is soft.  ?Skin: ?   General: Skin  is warm and dry.  ?   Capillary Refill: Capillary refill takes less than 2 seconds.  ?Neurological:  ?   General: No focal deficit present.  ?   Mental Status: She is alert and oriented to person, place, and time.  ?Psychiatric:     ?   Mood and Affect: Mood normal.     ?   Behavior: Behavior normal.  ? ? ?BP 122/82   Pulse 100   Temp (!) 97.2 ?F (36.2 ?C)   Ht _0  (1.575 m)   Wt 145 lb (65.8 kg)   SpO2 98%   BMI 26.52 kg/m?   ?Wt Readings from Last 3 Encounters:  ?04/29/21 145 lb (65.8 kg)  ?01/17/21 149 lb (67.6 kg)  ?12/16/20 148 lb (67.1 kg)  ? ? ?Health Maintenance Due  ?Topic Date Due  ? Hepatitis C Screening  Never done  ? TETANUS/TDAP  Never done  ? Zoster Vaccines- Shingrix (1 of 2) Never done  ? MAMMOGRAM  Never done  ? INFLUENZA VACCINE  Never done  ? ? ? ? ? ?Lab Results  ?Component Value Date  ? TSH 1.860 07/11/2020  ? ?Lab Results  ?Component Value Date  ? WBC 7.7 10/16/2020  ? HGB 12.0 10/16/2020  ? HCT 35.6 10/16/2020  ? MCV 90 10/16/2020  ? PLT 356 10/16/2020  ? ?Lab Results  ?Component Value Date  ? NA 143 10/16/2020  ? K 3.4 (L) 10/16/2020  ? CO2 27 10/16/2020  ? GLUCOSE 105 (H) 10/16/2020  ? BUN 12 10/16/2020  ? CREATININE 0.86 10/16/2020  ? BILITOT <0.2 10/16/2020  ? ALKPHOS 117 10/16/2020  ? AST 20 10/16/2020  ? ALT 13 10/16/2020  ? PROT 6.2 10/16/2020  ? ALBUMIN 4.0 10/16/2020  ? CALCIUM 8.0 (L) 10/16/2020  ? ANIONGAP 11 03/09/2018  ? EGFR 79 10/16/2020  ? GFR 75.48 05/24/2019  ? ?Lab Results  ?Component Value Date  ? CHOL 135 10/16/2020  ? ?Lab Results  ?Component Value Date  ? HDL 37 (L) 10/16/2020  ? ?Lab Results  ?Component Value Date  ? [place] 71 10/16/2020  ? ?Lab Results   ?Component Value Date  ? TRIG 158 (H) 10/16/2020  ? ?Lab Results  ?Component Value Date  ? CHOLHDL 3.6 10/16/2020  ? ? ? ?   ?Assessment & Plan:  ? ? ?1. Gastroenteritis ?- CBC with Differential/Platelet ?- Comprehensive meta

## 2021-04-29 NOTE — Patient Instructions (Addendum)
Rest and push fluids ?We will call you with lab results ?Take Vit D 50, 000 units weekly ?Use Trelegy inhaler daily, rinse mouth afterwards ?Take Zofran as needed for nausea ?Stop Nexium ?Begin Protonix 40 mg daily for GERD ?Avoid foods that trigger GERD ?Follow-up as needed ? ? ?Vitamin D Deficiency ?Vitamin D deficiency is when your body does not have enough vitamin D. Vitamin D is important to your body because: ?It helps your body use other minerals. ?It helps to keep your bones strong and healthy. ?It may help to prevent some diseases. ?It helps your heart and other muscles work well. ?Not getting enough vitamin D can make your bones soft. It can also cause other health problems. ?What are the causes? ?This condition may be caused by: ?Not eating enough foods that contain vitamin D. ?Not getting enough sun. ?Having diseases that make it hard for your body to absorb vitamin D. ?Having a surgery in which a part of the stomach or a part of the small intestine is removed. ?Having kidney disease or liver disease. ?What increases the risk? ?You are more likely to get this condition if: ?You are older. ?You do not spend much time outdoors. ?You live in a nursing home. ?You have had broken bones. ?You have weak or thin bones (osteoporosis). ?You have a disease or condition that changes how your body absorbs vitamin D. ?You have dark skin. ?You take certain medicines. ?You are overweight or obese. ?What are the signs or symptoms? ?In mild cases, there may not be any symptoms. If the condition is very bad, symptoms may include: ?Bone pain. ?Muscle pain. ?Falling often. ?Broken bones caused by a minor injury. ?How is this treated? ?Treatment may include taking supplements as told by your doctor. Your doctor will tell you what dose is best for you. Supplements may include: ?Vitamin D. ?Calcium. ?Follow these instructions at home: ?Eating and drinking ? ?Eat foods that contain vitamin D, such as: ?Dairy products, cereals,  or juices with added vitamin D. Check the label. ?Fish, such as salmon or trout. ?Eggs. ?Oysters. ?Mushrooms. ?The items listed above may not be a complete list of what you can eat and drink. Contact a dietitian for more options. ?General instructions ?Take medicines and supplements only as told by your doctor. ?Get regular, safe exposure to natural sunlight. ?Do not use a tanning bed. ?Maintain a healthy weight. Lose weight if needed. ?Keep all follow-up visits as told by your doctor. This is important. ?How is this prevented? ?You can get vitamin D by: ?Eating foods that naturally contain vitamin D. ?Eating or drinking products that have vitamin D added to them, such as cereals, juices, and milk. ?Taking vitamin D or a multivitamin that contains vitamin D. ?Being in the sun. Your body makes vitamin D when your skin is exposed to sunlight. Your body changes the sunlight into a form of the vitamin that it can use. ?Contact a doctor if: ?Your symptoms do not go away. ?You feel sick to your stomach (nauseous). ?You throw up (vomit). ?You poop less often than normal, or you have trouble pooping (constipation). ?Summary ?Vitamin D deficiency is when your body does not have enough vitamin D. ?Vitamin D helps to keep your bones strong and healthy. ?This condition is often treated by taking a supplement. ?Your doctor will tell you what dose is best for you. ?This information is not intended to replace advice given to you by your health care provider. Make sure you discuss any questions  you have with your health care provider. ?Document Revised: 10/11/2017 Document Reviewed: 10/11/2017 ?Elsevier Patient Education ? 2022 [place]. ? ? ? ?Viral Gastroenteritis, Adult ?Viral gastroenteritis is also known as the stomach flu. This condition may affect your stomach, your small intestine, and your large intestine. It can cause sudden watery poop (diarrhea), fever, and throwing up (vomiting). This condition is caused by certain  germs (viruses). These germs can be passed from person to person very easily (are contagious). ?Having watery poop and throwing up can make you feel weak and cause you to not have enough water in your body (get dehydrated). This can make you tired and thirsty, make you have a dry mouth, and make it so you pee (urinate) less often. It is important to replace the fluids that you lose from having watery poop and throwing up. ?What are the causes? ?You can get sick by catching viruses from other people. ?You can also get sick by: ?Eating food, drinking water, or touching a surface that has the viruses on it (is contaminated). ?Sharing utensils or other personal items with a person who is sick. ?What increases the risk? ?Having a weak body defense system (immune system). ?Living with one or more children who are younger than 60 years old. ?Living in a nursing home. ?Going on cruise ships. ?What are the signs or symptoms? ?Symptoms of this condition start suddenly. Symptoms may last for a few days or for as long as a week. ?Common symptoms include: ?Watery poop. ?Throwing up. ?Other symptoms include: ?Fever. ?Headache. ?Feeling tired (fatigue). ?Pain in the belly (abdomen). ?Chills. ?Feeling weak. ?Feeling sick to your stomach (nauseous). ?Muscle aches. ?Not feeling hungry. ?How is this treated? ?This condition typically goes away on its own. ?The focus of treatment is to replace the fluids that you lose. This condition may be treated with: ?An ORS (oral rehydration solution). This is a drink that is sold at pharmacies and stores. ?Medicines to help with your symptoms. ?Probiotic supplements to reduce symptoms of diarrhea. ?Fluids given through an IV tube, if needed. ?Older adults and people with other diseases or a weak body defense system are at higher risk for not having enough water in the body. ?Follow these instructions at home: ?Eating and drinking ? ?Take an ORS as told by your doctor. ?Drink clear fluids in small  amounts as you are able. Clear fluids include: ?Water. ?Ice chips. ?Fruit juice with water added to it (diluted). ?Low-calorie sports drinks. ?Drink enough fluid to keep your pee (urine) pale yellow. ?Eat small amounts of healthy foods every 3-4 hours as you are able. This may include whole grains, fruits, vegetables, lean meats, and yogurt. ?Avoid fluids that have a lot of sugar or caffeine in them, such as energy drinks, sports drinks, and soda. ?Avoid spicy or fatty foods. ?Avoid alcohol. ?General instructions ? ?Wash your hands often. This is very important after you have watery poop or you throw up. If you cannot use soap and water, use hand sanitizer. ?Make sure that all people in your home wash their hands well and often. ?Take over-the-counter and prescription medicines only as told by your doctor. ?Rest at home while you get better. ?Watch your condition for any changes. ?Take a warm bath to help with any burning or pain from having watery poop. ?Keep all follow-up visits as told by your doctor. This is important. ?Contact a doctor if: ?You cannot keep fluids down. ?Your symptoms get worse. ?You have new symptoms. ?You feel light-headed. ?  You feel dizzy. ?You have muscle cramps. ?Get help right away if: ?You have chest pain. ?You feel very weak. ?You pass out (faint). ?You see blood in your throw-up. ?Your throw-up looks like coffee grounds. ?You have bloody or black poop (stools) or poop that looks like tar. ?You have a very bad headache, or a stiff neck, or both. ?You have a rash. ?You have very bad pain, cramping, or bloating in your belly. ?You have trouble breathing. ?You are breathing very quickly. ?You have a fast heartbeat. ?Your skin feels cold and clammy. ?You feel mixed up (confused). ?You have pain when you pee. ?You have signs of not having enough water in the body, such as: ?Dark pee, hardly any pee, or no pee. ?Cracked lips. ?Dry mouth. ?Sunken eyes. ?Feeling very sleepy. ?Feeling  weak. ?Summary ?Viral gastroenteritis is also known as the stomach flu. ?This condition can cause sudden watery poop (diarrhea), fever, and throwing up (vomiting). ?These germs can be passed from person to [facility]

## 2021-04-30 ENCOUNTER — Other Ambulatory Visit: Payer: Self-pay | Admitting: Nurse Practitioner

## 2021-04-30 DIAGNOSIS — E559 Vitamin D deficiency, unspecified: Secondary | ICD-10-CM

## 2021-04-30 LAB — COMPREHENSIVE METABOLIC PANEL
ALT: 13 IU/L (ref 0–32)
AST: 19 IU/L (ref 0–40)
Albumin/Globulin Ratio: 1.7 (ref 1.2–2.2)
Albumin: 4.1 g/dL (ref 3.8–4.9)
Alkaline Phosphatase: 109 IU/L (ref 44–121)
BUN/Creatinine Ratio: 9 (ref 9–23)
BUN: 7 mg/dL (ref 6–24)
Bilirubin Total: <0.2 mg/dL (ref 0.0–1.2)
CO2: 24 mmol/L (ref 20–29)
Calcium: 8.6 mg/dL — ABNORMAL LOW (ref 8.7–10.2)
Chloride: 100 mmol/L (ref 96–106)
Creatinine, Ser: 0.74 mg/dL (ref 0.57–1.00)
Globulin, Total: 2.4 g/dL (ref 1.5–4.5)
Glucose: 122 mg/dL — ABNORMAL HIGH (ref 70–99)
Potassium: 4.1 mmol/L (ref 3.5–5.2)
Sodium: 143 mmol/L (ref 134–144)
Total Protein: 6.5 g/dL (ref 6.0–8.5)
eGFR: 94 mL/min/{1.73_m2} (ref >59)

## 2021-04-30 LAB — CBC WITH DIFFERENTIAL/PLATELET
Basophils Absolute: 0 10*3/uL (ref 0.0–0.2)
Basos: 1 %
EOS (ABSOLUTE): 0.1 10*3/uL (ref 0.0–0.4)
Eos: 2 %
Hematocrit: 44.4 % (ref 34.0–46.6)
Hemoglobin: 14.5 g/dL (ref 11.1–15.9)
Immature Grans (Abs): 0 10*3/uL (ref 0.0–0.1)
Immature Granulocytes: 0 %
Lymphocytes Absolute: 2.7 10*3/uL (ref 0.7–3.1)
Lymphs: 44 %
MCH: 29.7 pg (ref 26.6–33.0)
MCHC: 32.7 g/dL (ref 31.5–35.7)
MCV: 91 fL (ref 79–97)
Monocytes Absolute: 0.6 10*3/uL (ref 0.1–0.9)
Monocytes: 10 %
Neutrophils Absolute: 2.6 10*3/uL (ref 1.4–7.0)
Neutrophils: 43 %
Platelets: 388 10*3/uL (ref 150–450)
RBC: 4.89 x10E6/uL (ref 3.77–5.28)
RDW: 12.3 % (ref 11.7–15.4)
WBC: 6.1 10*3/uL (ref 3.4–10.8)

## 2021-04-30 LAB — VITAMIN D 25 HYDROXY (VIT D DEFICIENCY, FRACTURES): Vit D, 25-Hydroxy: 9.2 ng/mL — ABNORMAL LOW (ref 30.0–100.0)

## 2021-04-30 LAB — TSH: TSH: 1.04 u[IU]/mL (ref 0.450–4.500)

## 2021-04-30 MED ORDER — VITAMIN D (ERGOCALCIFEROL) 1.25 MG (50000 UNIT) PO CAPS: 50000.0000 [IU] | ORAL_CAPSULE | ORAL | 2 refills | Status: DC

## 2021-06-13 ENCOUNTER — Ambulatory Visit (INDEPENDENT_AMBULATORY_CARE_PROVIDER_SITE_OTHER): Payer: 59 | Admitting: Nurse Practitioner

## 2021-06-13 ENCOUNTER — Encounter: Payer: Self-pay | Admitting: Nurse Practitioner

## 2021-06-13 VITALS — BP 130/70 | HR 86 | Temp 97.3°F | Ht 62.0 in | Wt 149.0 lb

## 2021-06-13 DIAGNOSIS — Z8619 Personal history of other infectious and parasitic diseases: Secondary | ICD-10-CM

## 2021-06-13 DIAGNOSIS — J018 Other acute sinusitis: Secondary | ICD-10-CM

## 2021-06-13 DIAGNOSIS — J441 Chronic obstructive pulmonary disease with (acute) exacerbation: Secondary | ICD-10-CM

## 2021-06-13 MED ORDER — DOXYCYCLINE HYCLATE 100 MG PO TABS: 100.0000 mg | ORAL_TABLET | Freq: Two times a day (BID) | ORAL | 0 refills | Status: DC

## 2021-06-13 MED ORDER — TRELEGY ELLIPTA 100-62.5-25 MCG/ACT IN AEPB: 1.0000 | INHALATION_SPRAY | Freq: Every day | RESPIRATORY_TRACT | 0 refills | Status: DC

## 2021-06-13 MED ORDER — FLUCONAZOLE 150 MG PO TABS: 150.0000 mg | ORAL_TABLET | Freq: Once | ORAL | 0 refills | Status: AC

## 2021-06-13 MED ORDER — BUDESONIDE-FORMOTEROL FUMARATE 160-4.5 MCG/ACT IN AERO: 2.0000 | INHALATION_SPRAY | Freq: Two times a day (BID) | RESPIRATORY_TRACT | 3 refills | Status: DC

## 2021-06-13 NOTE — Progress Notes (Signed)
? ?Acute Office Visit ? ?Subjective:  ? ? Patient ID: Mary Franco, female    DOB: [birth year], 56 y.o.   MRN: [id number] ? ?Chief Complaint  ?Patient presents with  ? Sore Throat  ? ? ?HPI: ?Patient presents with sore throat, dry cough, chest tightness, and mild dyspnea, headache, and sinus pressure. Onset was yesterday. Treatment has included Zyrtec and Tylenol. She has been recently exposed to flu and strep. She has history of allergic rhinitis and COPD, current cigarette smoker.  ?Past Medical History:  ?Diagnosis Date  ? Alcohol abuse   ? Anxiety   ? ASD (atrial septal defect) 07/19/2018  ? Asthma   ? Cervical strain 04/16/2014  ? Cervicogenic headache 04/16/2014  ? Chronic combined systolic and diastolic CHF (congestive heart failure) ([place]) 03/25/2018  ? Echo 02/2018: EF 40-45 // Echo 07/2018:  EF 55-60, trivial AI, small secundum ASD (L>R shunting).  ? COPD (chronic obstructive pulmonary disease) ([place])   ? Fibromyalgia   ? Migraines   ? ? ?Past Surgical History:  ?Procedure Laterality Date  ? APPENDECTOMY    ? KNEE SURGERY Left   ? RIGHT/LEFT HEART CATH AND CORONARY ANGIOGRAPHY N/A 03/08/2018  ? Procedure: RIGHT/LEFT HEART CATH AND CORONARY ANGIOGRAPHY;  Surgeon: Lorretta Harp, MD;  Location: [place] [facility];  Service: Cardiovascular;  Laterality: N/A;  ? TUBAL LIGATION    ? ? ?Family History  ?Problem Relation Age of Onset  ? Cancer Mother   ? Colitis Paternal Aunt   ? Ovarian cancer Maternal Grandmother   ? ? ?Social History  ? ?Socioeconomic History  ? Marital status: Divorced  ?  Spouse name: Not on file  ? Number of children: 2  ? Years of education: Not on file  ? Highest education level: Not on file  ?Occupational History  ? Occupation: Textiles  ?Tobacco Use  ? Smoking status: Every Day  ?  Packs/day: 1.50  ?  Years: 40.00  ?  Pack years: 60.00  ?  Types: Cigarettes  ? Smokeless tobacco: Never  ?Vaping Use  ? Vaping Use: Never used  ?Substance and Sexual Activity  ? Alcohol use: Yes  ?  Comment: 2 x  per week  ? Drug use: No  ? Sexual activity: Not Currently  ?Other Topics Concern  ? Not on file  ?Social History Narrative  ? ** Merged History Encounter **  ? Patient is right handed.  ? Patient drinks 3 cups caffeine daily.  ? Working on 2nd shift now at her job.  08-15-14  ?   ?    ? ?Social Determinants of Health  ? ?Financial Resource Strain: Not on file  ?Food Insecurity: Not on file  ?Transportation Needs: Not on file  ?Physical Activity: Not on file  ?Stress: Not on file  ?Social Connections: Not on file  ?Intimate Partner Violence: Not on file  ? ? ?Outpatient Medications Prior to Visit  ?Medication Sig Dispense Refill  ? albuterol (VENTOLIN HFA) 108 (90 Base) MCG/ACT inhaler Inhale 2 puffs into the lungs every 6 (six) hours as needed for wheezing or shortness of breath. 8 g 3  ? alprazolam (XANAX) 2 MG tablet Take 2 mg by mouth at bedtime as needed.    ? amLODipine (NORVASC) 5 MG tablet Take 1 tablet by mouth once daily 90 tablet 0  ? bisoprolol (ZEBETA) 5 MG tablet Take 1 tablet by mouth once daily 90 tablet 0  ? ciprofloxacin (CILOXAN) 0.3 % ophthalmic solution SMARTSIG:2 Drop(s) In  Eye(s) Every 6 Hours    ? Fluticasone-Umeclidin-Vilant (TRELEGY ELLIPTA) 100-62.5-25 MCG/ACT AEPB Inhale 1 puff into the lungs daily. 1 each 11  ? gabapentin (NEURONTIN) 300 MG capsule Take 1 capsule (300 mg total) by mouth at bedtime. 30 capsule 1  ? ketoconazole (NIZORAL) 2 % shampoo Apply 1 application topically as needed.    ? KETOCONAZOLE, TOPICAL, (NIZORAL A-D) 1 % SHAM Nizoral A-D 1 % shampoo ? APPLY SHAMPOO BY TOPICAL ROUTE EVERY 3 DAYS LATHER, RINSE, AND REPEAT    ? losartan (COZAAR) 50 MG tablet Take 1 tablet (50 mg total) by mouth daily. 90 tablet 1  ? Magnesium 500 MG TABS Take 1 tablet by mouth daily.    ? Melatonin 3 MG TABS Take 3 mg by mouth at bedtime as needed (sleep).     ? meloxicam (MOBIC) 7.5 MG tablet Take 1 tablet (7.5 mg total) by mouth daily. 30 tablet 0  ? nystatin (MYCOSTATIN) 100000 UNIT/ML  suspension Take 5 mLs (500,000 Units total) by mouth 4 (four) times daily. 60 mL 0  ? ondansetron (ZOFRAN) 4 MG tablet Take 1 tablet (4 mg total) by mouth every 8 (eight) hours as needed for nausea or vomiting. 30 tablet 1  ? pantoprazole (PROTONIX) 40 MG tablet Take 1 tablet (40 mg total) by mouth daily. 30 tablet 3  ? potassium chloride SA (KLOR-CON M20) 20 MEQ tablet Take 1 tablet (20 mEq total) by mouth daily. 90 tablet 3  ? promethazine (PHENERGAN) 25 MG tablet Take 1 tablet (25 mg total) by mouth every 8 (eight) hours as needed for nausea. 60 tablet 3  ? rosuvastatin (CRESTOR) 20 MG tablet Take 1 tablet by mouth once daily 90 tablet 0  ? Vitamin D, Ergocalciferol, (DRISDOL) 1.25 MG (50000 UNIT) CAPS capsule Take 1 capsule (50,000 Units total) by mouth 2 (two) times a week. 10 capsule 2  ? ?No facility-administered medications prior to visit.  ? ? ?Allergies  ?Allergen Reactions  ? Codeine Itching  ? Sulfa Antibiotics Itching  ? Spironolactone   ?  Electrolyte imbalance  ? ? ?Review of Systems  ?Constitutional:  Negative for chills, fatigue and fever.  ?HENT:  Positive for congestion, rhinorrhea, sinus pressure and sore throat. Negative for ear pain.   ?Respiratory:  Positive for cough and chest tightness. Negative for shortness of breath.   ?Cardiovascular:  Negative for chest pain.  ?Gastrointestinal:  Positive for nausea. Negative for constipation, diarrhea and vomiting.  ?Neurological:  Positive for headaches. Negative for dizziness and light-headedness.  ? ?   ?Objective:  ?  ?Physical Exam ?Vitals reviewed.  ?HENT:  ?   Left Ear: Tenderness present.  ?   Nose: Congestion and rhinorrhea present.  ?   Mouth/Throat:  ?   Pharynx: Posterior oropharyngeal erythema present.  ?Cardiovascular:  ?   Rate and Rhythm: Normal rate and regular rhythm.  ?Pulmonary:  ?   Effort: Pulmonary effort is normal.  ?   Comments: Diminished breath sounds in all fields ?Skin: ?   General: Skin is warm and dry.  ?   Capillary  Refill: Capillary refill takes less than 2 seconds.  ?Neurological:  ?   General: No focal deficit present.  ?   Mental Status: She is alert and oriented to person, place, and time.  ? ? ?BP 130/70   Pulse 86   Temp (!) 97.3 ?F (36.3 ?C)   Ht 5' 2"  (1.575 m)   Wt 149 lb (67.6 kg)   SpO2 97%  BMI 27.25 kg/m?   ?Wt Readings from Last 3 Encounters:  ?06/13/21 149 lb (67.6 kg)  ?04/29/21 145 lb (65.8 kg)  ?01/17/21 149 lb (67.6 kg)  ? ? ?Health Maintenance Due  ?Topic Date Due  ? Hepatitis C Screening  Never done  ? TETANUS/TDAP  Never done  ? Zoster Vaccines- Shingrix (1 of 2) Never done  ? MAMMOGRAM  Never done  ? ? ? ? ? ?Lab Results  ?Component Value Date  ? TSH 1.040 04/29/2021  ? ?Lab Results  ?Component Value Date  ? WBC 6.1 04/29/2021  ? HGB 14.5 04/29/2021  ? HCT 44.4 04/29/2021  ? MCV 91 04/29/2021  ? PLT 388 04/29/2021  ? ?Lab Results  ?Component Value Date  ? NA 143 04/29/2021  ? K 4.1 04/29/2021  ? CO2 24 04/29/2021  ? GLUCOSE 122 (H) 04/29/2021  ? BUN 7 04/29/2021  ? CREATININE 0.74 04/29/2021  ? BILITOT <0.2 04/29/2021  ? ALKPHOS 109 04/29/2021  ? AST 19 04/29/2021  ? ALT 13 04/29/2021  ? PROT 6.5 04/29/2021  ? ALBUMIN 4.1 04/29/2021  ? CALCIUM 8.6 (L) 04/29/2021  ? ANIONGAP 11 03/09/2018  ? EGFR 94 04/29/2021  ? GFR 75.48 05/24/2019  ? ?Lab Results  ?Component Value Date  ? CHOL 135 10/16/2020  ? ?Lab Results  ?Component Value Date  ? HDL 37 (L) 10/16/2020  ? ?Lab Results  ?Component Value Date  ? [place] 71 10/16/2020  ? ?Lab Results  ?Component Value Date  ? TRIG 158 (H) 10/16/2020  ? ?Lab Results  ?Component Value Date  ? CHOLHDL 3.6 10/16/2020  ? ? ? ?   ?Assessment & Plan:  ? ?1. COPD with acute exacerbation ([place]) ?- Fluticasone-Umeclidin-Vilant (TRELEGY ELLIPTA) 100-62.5-25 MCG/ACT AEPB; Inhale 1 puff into the lungs daily.  Dispense: 4 each; Refill: 0 ?- budesonide-formoterol (SYMBICORT) 160-4.5 MCG/ACT inhaler; Inhale 2 puffs into the lungs 2 (two) times daily.  Dispense: 1 each; Refill:  3 ?- doxycycline (VIBRA-TABS) 100 MG tablet; Take 1 tablet (100 mg total) by mouth 2 (two) times daily.  Dispense: 14 tablet; Refill: 0 ? ?2. Acute non-recurrent sinusitis of other sinus ?- doxycycline (VIBRA-TABS) 100

## 2021-06-20 ENCOUNTER — Other Ambulatory Visit: Payer: Self-pay

## 2021-06-20 DIAGNOSIS — I428 Other cardiomyopathies: Secondary | ICD-10-CM

## 2021-06-20 MED ORDER — AMLODIPINE BESYLATE 5 MG PO TABS: 5.0000 mg | ORAL_TABLET | Freq: Every day | ORAL | 0 refills | Status: DC

## 2021-06-20 MED ORDER — ROSUVASTATIN CALCIUM 20 MG PO TABS: 20.0000 mg | ORAL_TABLET | Freq: Every day | ORAL | 0 refills | Status: DC

## 2021-07-25 ENCOUNTER — Other Ambulatory Visit: Payer: Self-pay | Admitting: Nurse Practitioner

## 2021-07-25 DIAGNOSIS — K219 Gastro-esophageal reflux disease without esophagitis: Secondary | ICD-10-CM

## 2021-09-01 ENCOUNTER — Other Ambulatory Visit: Payer: Self-pay

## 2021-09-01 DIAGNOSIS — E559 Vitamin D deficiency, unspecified: Secondary | ICD-10-CM

## 2021-09-01 MED ORDER — VITAMIN D (ERGOCALCIFEROL) 1.25 MG (50000 UNIT) PO CAPS: 50000.0000 [IU] | ORAL_CAPSULE | ORAL | 2 refills | Status: DC

## 2021-10-09 ENCOUNTER — Ambulatory Visit (INDEPENDENT_AMBULATORY_CARE_PROVIDER_SITE_OTHER): Payer: 59 | Admitting: Family Medicine

## 2021-10-09 ENCOUNTER — Encounter: Payer: Self-pay | Admitting: Family Medicine

## 2021-10-09 VITALS — BP 130/70 | HR 74 | Temp 97.3°F | Resp 18 | Ht 62.0 in | Wt 150.0 lb

## 2021-10-09 DIAGNOSIS — G8929 Other chronic pain: Secondary | ICD-10-CM

## 2021-10-09 DIAGNOSIS — J0181 Other acute recurrent sinusitis: Secondary | ICD-10-CM

## 2021-10-09 DIAGNOSIS — M25562 Pain in left knee: Secondary | ICD-10-CM

## 2021-10-09 HISTORY — DX: Pain in left knee: M25.562

## 2021-10-09 HISTORY — DX: Other chronic pain: G89.29

## 2021-10-09 MED ORDER — AMOXICILLIN-POT CLAVULANATE 875-125 MG PO TABS: 1.0000 | ORAL_TABLET | Freq: Two times a day (BID) | ORAL | 0 refills | Status: DC

## 2021-10-09 NOTE — Progress Notes (Unsigned)
Acute Office Visit  Subjective:    Patient ID: Mary Franco, female    DOB: 07/31/1965, 56 y.o.   MRN: [id number]  Chief Complaint  Patient presents with   Knee Pain    Left     HPI: Patient is in today for left knee pain,  Severe pain for about 10 days. Patient has wearing a brace. Tylenol does not help.  History left knee surgery > 20 years ago (done by Dr. Joie Bimler.)   Sinus symptoms for weeks. Coughing, congestion, rhinorrhea.    Past Medical History:  Diagnosis Date   Alcohol abuse    Anxiety    ASD (atrial septal defect) 07/19/2018   Asthma    Cervical strain 04/16/2014   Cervicogenic headache 04/16/2014   Chronic combined systolic and diastolic CHF (congestive heart failure) ([place]) 03/25/2018   Echo 02/2018: EF 40-45 // Echo 07/2018:  EF 55-60, trivial AI, small secundum ASD (L>R shunting).   COPD (chronic obstructive pulmonary disease) ([facility])    Fibromyalgia    Migraines     Past Surgical History:  Procedure Laterality Date   APPENDECTOMY     KNEE SURGERY Left    RIGHT/LEFT HEART CATH AND CORONARY ANGIOGRAPHY N/A 03/08/2018   Procedure: RIGHT/LEFT HEART CATH AND CORONARY ANGIOGRAPHY;  Surgeon: Lorretta Harp, MD;  Location: [facility] [facility];  Service: Cardiovascular;  Laterality: N/A;   TUBAL LIGATION      Family History  Problem Relation Age of Onset   Cancer Mother    Colitis Paternal Aunt    Ovarian cancer Maternal Grandmother     Social History   Socioeconomic History   Marital status: Divorced    Spouse name: Not on file   Number of children: 2   Years of education: Not on file   Highest education level: Not on file  Occupational History   Occupation: Textiles  Tobacco Use   Smoking status: Every Day    Packs/day: 1.50    Years: 40.00    Total pack years: 60.00    Types: Cigarettes   Smokeless tobacco: Never  Vaping Use   Vaping Use: Never used  Substance and Sexual Activity   Alcohol use: Yes    Comment: 2 x per week   Drug use: No    Sexual activity: Not Currently  Other Topics Concern   Not on file  Social History Narrative   ** Merged History Encounter **   Patient is right handed.   Patient drinks 3 cups caffeine daily.   Working on 2nd shift now at her job.  08-15-14          Social Determinants of Radio broadcast assistant Strain: Not on file  Food Insecurity: Not on file  Transportation Needs: Not on file  Physical Activity: Not on file  Stress: Not on file  Social Connections: Not on file  Intimate Partner Violence: Not on file    Outpatient Medications Prior to Visit  Medication Sig Dispense Refill   albuterol (VENTOLIN HFA) 108 (90 Base) MCG/ACT inhaler Inhale 2 puffs into the lungs every 6 (six) hours as needed for wheezing or shortness of breath. 8 g 3   alprazolam (XANAX) 2 MG tablet Take 2 mg by mouth at bedtime as needed.     amLODipine (NORVASC) 5 MG tablet Take 1 tablet (5 mg total) by mouth daily. 90 tablet 0   bisoprolol (ZEBETA) 5 MG tablet Take 1 tablet by mouth once daily 90 tablet 0  budesonide-formoterol (SYMBICORT) 160-4.5 MCG/ACT inhaler Inhale 2 puffs into the lungs 2 (two) times daily. 1 each 3   losartan (COZAAR) 50 MG tablet Take 1 tablet (50 mg total) by mouth daily. 90 tablet 1   Magnesium 500 MG TABS Take 1 tablet by mouth daily.     ondansetron (ZOFRAN) 4 MG tablet Take 1 tablet (4 mg total) by mouth every 8 (eight) hours as needed for nausea or vomiting. 30 tablet 1   pantoprazole (PROTONIX) 40 MG tablet Take 1 tablet by mouth once daily 90 tablet 0   rosuvastatin (CRESTOR) 20 MG tablet Take 1 tablet (20 mg total) by mouth daily. 90 tablet 0   Vitamin D, Ergocalciferol, (DRISDOL) 1.25 MG (50000 UNIT) CAPS capsule Take 1 capsule (50,000 Units total) by mouth 2 (two) times a week. 10 capsule 2   Melatonin 3 MG TABS Take 3 mg by mouth at bedtime as needed (sleep).      meloxicam (MOBIC) 7.5 MG tablet Take 1 tablet (7.5 mg total) by mouth daily. 30 tablet 0   gabapentin  (NEURONTIN) 300 MG capsule Take 1 capsule (300 mg total) by mouth at bedtime. 30 capsule 1   ciprofloxacin (CILOXAN) 0.3 % ophthalmic solution SMARTSIG:2 Drop(s) In Eye(s) Every 6 Hours     doxycycline (VIBRA-TABS) 100 MG tablet Take 1 tablet (100 mg total) by mouth 2 (two) times daily. 14 tablet 0   Fluticasone-Umeclidin-Vilant (TRELEGY ELLIPTA) 100-62.5-25 MCG/ACT AEPB Inhale 1 puff into the lungs daily. 1 each 11   Fluticasone-Umeclidin-Vilant (TRELEGY ELLIPTA) 100-62.5-25 MCG/ACT AEPB Inhale 1 puff into the lungs daily. 4 each 0   ketoconazole (NIZORAL) 2 % shampoo Apply 1 application topically as needed.     KETOCONAZOLE, TOPICAL, (NIZORAL A-D) 1 % SHAM Nizoral A-D 1 % shampoo  APPLY SHAMPOO BY TOPICAL ROUTE EVERY 3 DAYS LATHER, RINSE, AND REPEAT     nystatin (MYCOSTATIN) 100000 UNIT/ML suspension Take 5 mLs (500,000 Units total) by mouth 4 (four) times daily. 60 mL 0   potassium chloride SA (KLOR-CON M20) 20 MEQ tablet Take 1 tablet (20 mEq total) by mouth daily. 90 tablet 3   promethazine (PHENERGAN) 25 MG tablet Take 1 tablet (25 mg total) by mouth every 8 (eight) hours as needed for nausea. 60 tablet 3   No facility-administered medications prior to visit.    Allergies  Allergen Reactions   Codeine Itching   Sulfa Antibiotics Itching   Spironolactone     Electrolyte imbalance    Review of Systems  Constitutional:  Negative for chills and fever.  HENT:  Positive for rhinorrhea. Negative for ear pain and sore throat.   Respiratory:  Positive for cough and shortness of breath.   Cardiovascular:  Negative for chest pain.       Objective:    Physical Exam  BP 130/70   Pulse 74   Temp (!) 97.3 F (36.3 C)   Resp 18   Ht 5' 2"  (1.575 m)   Wt 150 lb (68 kg)   BMI 27.44 kg/m  Wt Readings from Last 3 Encounters:  10/09/21 150 lb (68 kg)  06/13/21 149 lb (67.6 kg)  04/29/21 145 lb (65.8 kg)    Health Maintenance Due  Topic Date Due   Hepatitis C Screening  Never done    TETANUS/TDAP  Never done   Zoster Vaccines- Shingrix (1 of 2) Never done   MAMMOGRAM  Never done   INFLUENZA VACCINE  09/16/2021    There are no preventive care  reminders to display for this patient.   Lab Results  Component Value Date   TSH 1.040 04/29/2021   Lab Results  Component Value Date   WBC 6.1 04/29/2021   HGB 14.5 04/29/2021   HCT 44.4 04/29/2021   MCV 91 04/29/2021   PLT 388 04/29/2021   Lab Results  Component Value Date   NA 143 04/29/2021   K 4.1 04/29/2021   CO2 24 04/29/2021   GLUCOSE 122 (H) 04/29/2021   BUN 7 04/29/2021   CREATININE 0.74 04/29/2021   BILITOT <0.2 04/29/2021   ALKPHOS 109 04/29/2021   AST 19 04/29/2021   ALT 13 04/29/2021   PROT 6.5 04/29/2021   ALBUMIN 4.1 04/29/2021   CALCIUM 8.6 (L) 04/29/2021   ANIONGAP 11 03/09/2018   EGFR 94 04/29/2021   GFR 75.48 05/24/2019   Lab Results  Component Value Date   CHOL 135 10/16/2020   Lab Results  Component Value Date   HDL 37 (L) 10/16/2020   Lab Results  Component Value Date   LDLCALC 71 10/16/2020   Lab Results  Component Value Date   TRIG 158 (H) 10/16/2020   Lab Results  Component Value Date   CHOLHDL 3.6 10/16/2020   No results found for: "HGBA1C"     Assessment & Plan:   Problem List Items Addressed This Visit   None  No orders of the defined types were placed in this encounter.   No orders of the defined types were placed in this encounter.   I,Marla I Leal-Borjas,acting as a scribe for Rochel Brome, MD.,have documented all relevant documentation on the behalf of Rochel Brome, MD,as directed by  Rochel Brome, MD while in the presence of Rochel Brome, MD.   Follow-up: No follow-ups on file.  An After Visit Summary was printed and given to the patient.  Rochel Brome, MD [facility] [phone]

## 2021-10-10 DIAGNOSIS — J0181 Other acute recurrent sinusitis: Secondary | ICD-10-CM

## 2021-10-10 HISTORY — DX: Other acute recurrent sinusitis: J01.81

## 2021-10-10 NOTE — Assessment & Plan Note (Signed)
Started Augmentin 875-125 mg twice a day per 10 days.

## 2021-10-12 NOTE — Assessment & Plan Note (Signed)
Risks were discussed including bleeding, infection, increase in sugars if diabetic, atrophy at site of injection, and increased pain.  After consent was obtained, using sterile technique the knee was prepped with alcohol.  Kenalog 80 mg and 5 ml plain Lidocaine was then injected and the needle withdrawn.  The procedure was well tolerated.   The patient is asked to continue to rest the joint for a few more days before resuming regular activities.  It may be more painful for the first 1-2 days.  Watch for fever, or increased swelling or persistent pain in the joint. Call or return to clinic prn if such symptoms occur or there is failure to improve as anticipated.

## 2021-10-13 ENCOUNTER — Other Ambulatory Visit: Payer: Self-pay

## 2021-10-13 DIAGNOSIS — E559 Vitamin D deficiency, unspecified: Secondary | ICD-10-CM

## 2021-10-13 MED ORDER — VITAMIN D (ERGOCALCIFEROL) 1.25 MG (50000 UNIT) PO CAPS: 50000.0000 [IU] | ORAL_CAPSULE | ORAL | 2 refills | Status: DC

## 2021-10-22 ENCOUNTER — Other Ambulatory Visit: Payer: Self-pay

## 2021-10-22 DIAGNOSIS — K219 Gastro-esophageal reflux disease without esophagitis: Secondary | ICD-10-CM

## 2021-10-22 MED ORDER — PANTOPRAZOLE SODIUM 40 MG PO TBEC: 40.0000 mg | DELAYED_RELEASE_TABLET | Freq: Every day | ORAL | 0 refills | Status: DC

## 2021-10-22 NOTE — Telephone Encounter (Signed)
Patient called stating she has had chest discomfort and when she bends over has acid in her throat since Monday and knows it "gas", denied headache, jaw pain, back pain or arm pain, chest tightness. Has been out of protonix but has been taking nexium. Patient encouraged to go to ER and also advised to go Per [place] [place], however patient refused. Aware that if symptoms persist or worsen to go to ER, scheduled appt for tomorrow and rx refill for protonix sent to pharmacy. Patient verbalized understanding.

## 2021-10-23 ENCOUNTER — Ambulatory Visit: Payer: BC Managed Care – PPO | Admitting: Nurse Practitioner

## 2021-10-23 ENCOUNTER — Encounter: Payer: Self-pay | Admitting: Nurse Practitioner

## 2021-10-23 VITALS — BP 162/82 | HR 89 | Temp 97.7°F | Ht 62.0 in | Wt 148.0 lb

## 2021-10-23 DIAGNOSIS — K219 Gastro-esophageal reflux disease without esophagitis: Secondary | ICD-10-CM

## 2021-10-23 DIAGNOSIS — F17219 Nicotine dependence, cigarettes, with unspecified nicotine-induced disorders: Secondary | ICD-10-CM

## 2021-10-23 MED ORDER — CHANTIX STARTING MONTH PAK 0.5 MG X 11 & 1 MG X 42 PO TBPK: ORAL_TABLET | ORAL | 0 refills | Status: AC

## 2021-10-23 NOTE — Progress Notes (Signed)
Acute Office Visit  Subjective:    Patient ID: Mary Franco, female    DOB: 02-22-1965, 56 y.o.   MRN: [id number]  Chief Complaint  Patient presents with   Gastroesophageal Reflux   HPI:  Patient is in today for GERD. She was previously prescribed Protonix 40 mg and Nexium 40 mg. States she ran out of medications.History of gastritis. Pt no longer drinks alcohol. States she is due for screening colonoscopy. Denies known history of gastric ulcers. Works night shift, drinks soft drinks for caffeine during working hours. She has requested smoking cessation medication.  Past Medical History:  Diagnosis Date   Alcohol abuse    Anxiety    ASD (atrial septal defect) 07/19/2018   Asthma    Cervical strain 04/16/2014   Cervicogenic headache 04/16/2014   Chronic combined systolic and diastolic CHF (congestive heart failure) ([facility]) 03/25/2018   Echo 02/2018: EF 40-45 // Echo 07/2018:  EF 55-60, trivial AI, small secundum ASD (L>R shunting).   COPD (chronic obstructive pulmonary disease) (HCC)    Fibromyalgia    Migraines     Past Surgical History:  Procedure Laterality Date   APPENDECTOMY     KNEE SURGERY Left    RIGHT/LEFT HEART CATH AND CORONARY ANGIOGRAPHY N/A 03/08/2018   Procedure: RIGHT/LEFT HEART CATH AND CORONARY ANGIOGRAPHY;  Surgeon: Lorretta Harp, MD;  Location: [facility];  Service: Cardiovascular;  Laterality: N/A;   TUBAL LIGATION      Family History  Problem Relation Age of Onset   Cancer Mother    Colitis Paternal Aunt    Ovarian cancer Maternal Grandmother     Social History   Socioeconomic History   Marital status: Divorced    Spouse name: Not on file   Number of children: 2   Years of education: Not on file   Highest education level: Not on file  Occupational History   Occupation: Textiles  Tobacco Use   Smoking status: Every Day    Packs/day: 1.50    Years: 40.00    Total pack years: 60.00    Types: Cigarettes   Smokeless tobacco: Never   Vaping Use   Vaping Use: Never used  Substance and Sexual Activity   Alcohol use: Yes    Comment: 2 x per week   Drug use: No   Sexual activity: Not Currently  Other Topics Concern   Not on file  Social History Narrative   ** Merged History Encounter **   Patient is right handed.   Patient drinks 3 cups caffeine daily.   Working on 2nd shift now at her job.  08-15-14          Social Determinants of Radio broadcast assistant Strain: Not on file  Food Insecurity: Not on file  Transportation Needs: Not on file  Physical Activity: Not on file  Stress: Not on file  Social Connections: Not on file  Intimate Partner Violence: Not on file    Outpatient Medications Prior to Visit  Medication Sig Dispense Refill   albuterol (VENTOLIN HFA) 108 (90 Base) MCG/ACT inhaler Inhale 2 puffs into the lungs every 6 (six) hours as needed for wheezing or shortness of breath. 8 g 3   alprazolam (XANAX) 2 MG tablet Take 2 mg by mouth at bedtime as needed.     amLODipine (NORVASC) 5 MG tablet Take 1 tablet (5 mg total) by mouth daily. 90 tablet 0   bisoprolol (ZEBETA) 5 MG tablet Take 1 tablet  by mouth once daily 90 tablet 0   budesonide-formoterol (SYMBICORT) 160-4.5 MCG/ACT inhaler Inhale 2 puffs into the lungs 2 (two) times daily. 1 each 3   esomeprazole (NEXIUM) 40 MG capsule Take 40 mg by mouth daily at 12 noon.     gabapentin (NEURONTIN) 300 MG capsule Take 1 capsule (300 mg total) by mouth at bedtime. 30 capsule 1   losartan (COZAAR) 50 MG tablet Take 1 tablet (50 mg total) by mouth daily. 90 tablet 1   Magnesium 500 MG TABS Take 1 tablet by mouth daily.     ondansetron (ZOFRAN) 4 MG tablet Take 1 tablet (4 mg total) by mouth every 8 (eight) hours as needed for nausea or vomiting. 30 tablet 1   pantoprazole (PROTONIX) 40 MG tablet Take 1 tablet (40 mg total) by mouth daily. 90 tablet 0   rosuvastatin (CRESTOR) 20 MG tablet Take 1 tablet (20 mg total) by mouth daily. 90 tablet 0   Vitamin  D, Ergocalciferol, (DRISDOL) 1.25 MG (50000 UNIT) CAPS capsule Take 1 capsule (50,000 Units total) by mouth 2 (two) times a week. 10 capsule 2   amoxicillin-clavulanate (AUGMENTIN) 875-125 MG tablet Take 1 tablet by mouth 2 (two) times daily. 20 tablet 0   No facility-administered medications prior to visit.    Allergies  Allergen Reactions   Codeine Itching   Sulfa Antibiotics Itching   Spironolactone     Electrolyte imbalance    Review of Systems  Gastrointestinal:  Positive for nausea.       GERD       Objective:    Physical Exam Vitals reviewed.  Constitutional:      Appearance: Normal appearance. She is normal weight.  Cardiovascular:     Rate and Rhythm: Normal rate and regular rhythm.     Heart sounds: Normal heart sounds.  Pulmonary:     Effort: Pulmonary effort is normal.     Breath sounds: Normal breath sounds.  Abdominal:     General: Abdomen is flat. Bowel sounds are normal.     Palpations: Abdomen is soft.  Neurological:     Mental Status: She is alert and oriented to person, place, and time.  Psychiatric:        Mood and Affect: Mood normal.        Behavior: Behavior normal.     BP (!) 162/82 (BP Location: Left Arm, Patient Position: Sitting)   Pulse 89   Temp 97.7 F (36.5 C) (Temporal)   Ht _0  (1.575 m)   Wt 148 lb (67.1 kg)   SpO2 97%   BMI 27.07 kg/m   Wt Readings from Last 3 Encounters:  10/23/21 148 lb (67.1 kg)  10/09/21 150 lb (68 kg)  06/13/21 149 lb (67.6 kg)    Health Maintenance Due  Topic Date Due   Hepatitis C Screening  Never done   TETANUS/TDAP  Never done   Zoster Vaccines- Shingrix (1 of 2) Never done   MAMMOGRAM  Never done   INFLUENZA VACCINE  Never done    Lab Results  Component Value Date   TSH 1.040 04/29/2021   Lab Results  Component Value Date   WBC 6.1 04/29/2021   HGB 14.5 04/29/2021   HCT 44.4 04/29/2021   MCV 91 04/29/2021   PLT 388 04/29/2021   Lab Results  Component Value Date   NA 143  04/29/2021   K 4.1 04/29/2021   CO2 24 04/29/2021   GLUCOSE 122 (H) 04/29/2021  BUN 7 04/29/2021   CREATININE 0.74 04/29/2021   BILITOT <0.2 04/29/2021   ALKPHOS 109 04/29/2021   AST 19 04/29/2021   ALT 13 04/29/2021   PROT 6.5 04/29/2021   ALBUMIN 4.1 04/29/2021   CALCIUM 8.6 (L) 04/29/2021   ANIONGAP 11 03/09/2018   EGFR 94 04/29/2021   GFR 75.48 05/24/2019   Lab Results  Component Value Date   CHOL 135 10/16/2020   Lab Results  Component Value Date   HDL 37 (L) 10/16/2020   Lab Results  Component Value Date   LDLCALC 71 10/16/2020   Lab Results  Component Value Date   TRIG 158 (H) 10/16/2020   Lab Results  Component Value Date   CHOLHDL 3.6 10/16/2020     Assessment & Plan:   1. Gastroesophageal reflux disease, unspecified whether esophagitis present - Ambulatory referral to Gastroenterology -avoid foods that trigger GERD -continue Nexium and Protonix as prescribed  2. Cigarette nicotine dependence with nicotine-induced disorder - Varenicline Tartrate, Starter, (CHANTIX STARTING MONTH PAK) 0.5 MG X 11 & 1 MG X 42 TBPK; Take 0.5 mg by mouth daily for 3 days, THEN 0.5 mg 2 (two) times daily for 4 days, THEN 1 mg 2 (two) times daily for 21 days.  Dispense: 1 each; Refill: 0    I,Lauren M Auman,acting as a Education administrator for [employer], NP.,have documented all relevant documentation on the behalf of Rip Harbour, NP,as directed by  Rip Harbour, NP while in the presence of Rip Harbour, NP.   Oleta Mouse, CMA  I, Rip Harbour, NP, have reviewed all documentation for this visit. The documentation on 10/23/21 for the exam, diagnosis, procedures, and orders are all accurate and complete.   SignedJerrell Belfast, DNP 10/23/21 at 8:10 pm

## 2021-10-23 NOTE — Patient Instructions (Addendum)
Take Chantix as prescribed Take Protonix 40 mg at night Take Nexium every morning We will call you with GI referral  Follow-up as noted  Food Choices for Gastroesophageal Reflux Disease, Adult When you have gastroesophageal reflux disease (GERD), the foods you eat and your eating habits are very important. Choosing the right foods can help ease your discomfort. Think about working with a food expert (dietitian) to help you make good choices. What are tips for following this plan? Reading food labels Look for foods that are low in saturated fat. Foods that may help with your symptoms include: Foods that have less than 5% of daily value (DV) of fat. Foods that have 0 grams of trans fat. Cooking Do not fry your food. Cook your food by baking, steaming, grilling, or broiling. These are all methods that do not need a lot of fat for cooking. To add flavor, try to use herbs that are low in spice and acidity. Meal planning  Choose healthy foods that are low in fat, such as: Fruits and vegetables. Whole grains. Low-fat dairy products. Lean meats, fish, and poultry. Eat small meals often instead of eating 3 large meals each day. Eat your meals slowly in a place where you are relaxed. Avoid bending over or lying down until 2-3 hours after eating. Limit high-fat foods such as fatty meats or fried foods. Limit your intake of fatty foods, such as oils, butter, and shortening. Avoid the following as told by your doctor: Foods that cause symptoms. These may be different for different people. Keep a food diary to keep track of foods that cause symptoms. Alcohol. Drinking a lot of liquid with meals. Eating meals during the 2-3 hours before bed. Lifestyle Stay at a healthy weight. Ask your doctor what weight is healthy for you. If you need to lose weight, work with your doctor to do so safely. Exercise for at least 30 minutes on 5 or more days each week, or as told by your doctor. Wear loose-fitting  clothes. Do not smoke or use any products that contain nicotine or tobacco. If you need help quitting, ask your doctor. Sleep with the head of your bed higher than your feet. Use a wedge under the mattress or blocks under the bed frame to raise the head of the bed. Chew sugar-free gum after meals. What foods should eat?  Eat a healthy, well-balanced diet of fruits, vegetables, whole grains, low-fat dairy products, lean meats, fish, and poultry. Each person is different. Foods that may cause symptoms in one person may not cause any symptoms in another person. Work with your doctor to find foods that are safe for you. The items listed above may not be a complete list of what you can eat and drink. Contact a food expert for more options. What foods should I avoid? Limiting some of these foods may help in managing the symptoms of GERD. Everyone is different. Talk with a food expert or your doctor to help you find the exact foods to avoid, if any. Fruits Any fruits prepared with added fat. Any fruits that cause symptoms. For some people, this may include citrus fruits, such as oranges, grapefruit, pineapple, and lemons. Vegetables Deep-fried vegetables. [place] fries. Any vegetables prepared with added fat. Any vegetables that cause symptoms. For some people, this may include tomatoes and tomato products, chili peppers, onions and garlic, and horseradish. Grains Pastries or quick breads with added fat. Meats and other proteins High-fat meats, such as fatty beef or pork, hot  dogs, ribs, ham, sausage, salami, and bacon. Fried meat or protein, including fried fish and fried chicken. Nuts and nut butters, in large amounts. Dairy Whole milk and chocolate milk. Sour cream. Cream. Ice cream. Cream cheese. Milkshakes. Fats and oils Butter. Margarine. Shortening. Ghee. Beverages Coffee and tea, with or without caffeine. Carbonated beverages. Sodas. Energy drinks. Fruit juice made with acidic fruits, such as  orange or grapefruit. Tomato juice. Alcoholic drinks. Sweets and desserts Chocolate and cocoa. Donuts. Seasonings and condiments Pepper. Peppermint and spearmint. Added salt. Any condiments, herbs, or seasonings that cause symptoms. For some people, this may include curry, hot sauce, or vinegar-based salad dressings. The items listed above may not be a complete list of what you should not eat and drink. Contact a food expert for more options. Questions to ask your doctor Diet and lifestyle changes are often the first steps that are taken to manage symptoms of GERD. If diet and lifestyle changes do not help, talk with your doctor about taking medicines. Where to find more information International Foundation for Gastrointestinal Disorders: aboutgerd.org Summary When you have GERD, food and lifestyle choices are very important in easing your symptoms. Eat small meals often instead of 3 large meals a day. Eat your meals slowly and in a place where you are relaxed. Avoid bending over or lying down until 2-3 hours after eating. Limit high-fat foods such as fatty meats or fried foods. This information is not intended to replace advice given to you by your health care provider. Make sure you discuss any questions you have with your health care provider. Document Revised: 08/14/2019 Document Reviewed: 08/14/2019 Elsevier Patient Education  [place] of Quitting Smoking Quitting smoking is a physical and mental challenge. You may have cravings, withdrawal symptoms, and temptation to smoke. Before quitting, work with your health care provider to make a plan that can help you manage quitting. Making a plan before you quit may keep you from smoking when you have the urge to smoke while trying to quit. How to manage lifestyle changes Managing stress Stress can make you want to smoke, and wanting to smoke may cause stress. It is important to find ways to manage your  stress. You could try some of the following: Practice relaxation techniques. Breathe slowly and deeply, in through your nose and out through your mouth. Listen to music. Soak in a bath or take a shower. Imagine a peaceful place or vacation. Get some support. Talk with family or friends about your stress. Join a support group. Talk with a counselor or therapist. Get some physical activity. Go for a walk, run, or bike ride. Play a favorite sport. Practice yoga.  Medicines Talk with your health care provider about medicines that might help you deal with cravings and make quitting easier for you. Relationships Social situations can be difficult when you are quitting smoking. To manage this, you can: Avoid parties and other social situations where people might be smoking. Avoid alcohol. Leave right away if you have the urge to smoke. Explain to your family and friends that you are quitting smoking. Ask for support and let them know you might be a bit grumpy. Plan activities where smoking is not an option. General instructions Be aware that many people gain weight after they quit smoking. However, not everyone does. To keep from gaining weight, have a plan in place before you quit, and stick to the plan after you quit. Your plan should include:  Eating healthy snacks. When you have a craving, it may help to: Eat popcorn, or try carrots, celery, or other cut vegetables. Chew sugar-free gum. Changing how you eat. Eat small portion sizes at meals. Eat 4-6 small meals throughout the day instead of 1-2 large meals a day. Be mindful when you eat. You should avoid watching television or doing other things that might distract you as you eat. Exercising regularly. Make time to exercise each day. If you do not have time for a long workout, do short bouts of exercise for 5-10 minutes several times a day. Do some form of strengthening exercise, such as weight lifting. Do some exercise that gets your  heart beating and causes you to breathe deeply, such as walking fast, running, swimming, or biking. This is very important. Drinking plenty of water or other low-calorie or no-calorie drinks. Drink enough fluid to keep your urine pale yellow.  How to recognize withdrawal symptoms Your body and mind may experience discomfort as you try to get used to not having nicotine in your system. These effects are called withdrawal symptoms. They may include: Feeling hungrier than normal. Having trouble concentrating. Feeling irritable or restless. Having trouble sleeping. Feeling depressed. Craving a cigarette. These symptoms may surprise you, but they are normal to have when quitting smoking. To manage withdrawal symptoms: Avoid places, people, and activities that trigger your cravings. Remember why you want to quit. Get plenty of sleep. Avoid coffee and other drinks that contain caffeine. These may worsen some of your symptoms. How to manage cravings Come up with a plan for how to deal with your cravings. The plan should include the following: A definition of the specific situation you want to deal with. An activity or action you will take to replace smoking. A clear idea for how this action will help. The name of someone who could help you with this. Cravings usually last for 5-10 minutes. Consider taking the following actions to help you with your plan to deal with cravings: Keep your mouth busy. Chew sugar-free gum. Suck on hard candies or a straw. Brush your teeth. Keep your hands and body busy. Change to a different activity right away. Squeeze or play with a ball. Do an activity or a hobby, such as making bead jewelry, practicing needlepoint, or working with wood. Mix up your normal routine. Take a short exercise break. Go for a quick walk, or run up and down stairs. Focus on doing something kind or helpful for someone else. Call a friend or family member to talk during a  craving. Join a support group. Contact a quitline. Where to find support To get help or find a support group: Call the [facility]'s Smoking Quitline: 1-800-QUIT-NOW 518 360 9156) Text QUIT to SmokefreeTXT: 016010 Where to find more information Visit these websites to find more information on quitting smoking: U.S. Department of Health and Human Services: www.smokefree.gov American Lung Association: www.freedomfromsmoking.org Centers for Disease Control and Prevention (CDC): http://www.wolf.info/ American Heart Association: www.heart.org Contact a health care provider if: You want to change your plan for quitting. The medicines you are taking are not helping. Your eating feels out of control or you cannot sleep. You feel depressed or become very anxious. Summary Quitting smoking is a physical and mental challenge. You will face cravings, withdrawal symptoms, and temptation to smoke again. Preparation can help you as you go through these challenges. Try different techniques to manage stress, handle social situations, and prevent weight gain. You can deal with cravings  by keeping your mouth busy (such as by chewing gum), keeping your hands and body busy, calling family or friends, or contacting a quitline for people who want to quit smoking. You can deal with withdrawal symptoms by avoiding places where people smoke, getting plenty of rest, and avoiding drinks that contain caffeine. This information is not intended to replace advice given to you by your health care provider. Make sure you discuss any questions you have with your health care provider. Document Revised: 01/24/2021 Document Reviewed: 01/24/2021 Elsevier Patient Education  [place].

## 2021-10-28 ENCOUNTER — Encounter: Payer: Self-pay | Admitting: Nurse Practitioner

## 2021-10-28 ENCOUNTER — Ambulatory Visit: Payer: BC Managed Care – PPO | Admitting: Nurse Practitioner

## 2021-10-28 VITALS — BP 150/100 | HR 84 | Temp 97.1°F | Ht 63.0 in | Wt 148.0 lb

## 2021-10-28 DIAGNOSIS — J0181 Other acute recurrent sinusitis: Secondary | ICD-10-CM

## 2021-10-28 DIAGNOSIS — E559 Vitamin D deficiency, unspecified: Secondary | ICD-10-CM

## 2021-10-28 DIAGNOSIS — I10 Essential (primary) hypertension: Secondary | ICD-10-CM | POA: Diagnosis not present

## 2021-10-28 DIAGNOSIS — E782 Mixed hyperlipidemia: Secondary | ICD-10-CM | POA: Diagnosis not present

## 2021-10-28 DIAGNOSIS — J449 Chronic obstructive pulmonary disease, unspecified: Secondary | ICD-10-CM | POA: Diagnosis not present

## 2021-10-28 DIAGNOSIS — K219 Gastro-esophageal reflux disease without esophagitis: Secondary | ICD-10-CM | POA: Diagnosis not present

## 2021-10-28 DIAGNOSIS — Z2820 Immunization not carried out because of patient decision for unspecified reason: Secondary | ICD-10-CM

## 2021-10-28 MED ORDER — TRELEGY ELLIPTA 100-62.5-25 MCG/ACT IN AEPB: 1.0000 | INHALATION_SPRAY | Freq: Every day | RESPIRATORY_TRACT | 11 refills | Status: DC

## 2021-10-28 MED ORDER — TRELEGY ELLIPTA 100-62.5-25 MCG/ACT IN AEPB: 1.0000 | INHALATION_SPRAY | Freq: Every day | RESPIRATORY_TRACT | 0 refills | Status: DC

## 2021-10-28 MED ORDER — AMOXICILLIN-POT CLAVULANATE 875-125 MG PO TABS: 1.0000 | ORAL_TABLET | Freq: Two times a day (BID) | ORAL | 0 refills | Status: DC

## 2021-10-28 NOTE — Patient Instructions (Addendum)
Continue medications We will call you with lab results Take Augmentin twice daily for 10 days for sinusitis Follow-up in 89-months,fasting   Sinus Infection, Adult A sinus infection is soreness and swelling (inflammation) of your sinuses. Sinuses are hollow spaces in the bones around your face. They are located: Around your eyes. In the middle of your forehead. Behind your nose. In your cheekbones. Your sinuses and nasal passages are lined with a fluid called mucus. Mucus drains out of your sinuses. Swelling can trap mucus in your sinuses. This lets germs (bacteria, virus, or fungus) grow, which leads to infection. Most of the time, this condition is caused by a virus. What are the causes? Allergies. Asthma. Germs. Things that block your nose or sinuses. Growths in the nose (nasal polyps). Chemicals or irritants in the air. A fungus. This is rare. What increases the risk? Having a weak body defense system (immune system). Doing a lot of swimming or diving. Using nasal sprays too much. Smoking. What are the signs or symptoms? The main symptoms of this condition are pain and a feeling of pressure around the sinuses. Other symptoms include: Stuffy nose (congestion). This may make it hard to breathe through your nose. Runny nose (drainage). Soreness, swelling, and warmth in the sinuses. A cough that may get worse at night. Being unable to smell and taste. Mucus that collects in the throat or the back of the nose (postnasal drip). This may cause a sore throat or bad breath. Being very tired (fatigued). A fever. How is this diagnosed? Your symptoms. Your medical history. A physical exam. Tests to find out if your condition is short-term (acute) or long-term (chronic). Your doctor may: Check your nose for growths (polyps). Check your sinuses using a tool that has a light on one end (endoscope). Check for allergies or germs. Do imaging tests, such as an MRI or CT scan. How is this  treated? Treatment for this condition depends on the cause and whether it is short-term or long-term. If caused by a virus, your symptoms should go away on their own within 10 days. You may be given medicines to relieve symptoms. They include: Medicines that shrink swollen tissue in the nose. A spray that treats swelling of the nostrils. Rinses that help get rid of thick mucus in your nose (nasal saline washes). Medicines that treat allergies (antihistamines). Over-the-counter pain relievers. If caused by bacteria, your doctor may wait to see if you will get better without treatment. You may be given antibiotic medicine if you have: A very bad infection. A weak body defense system. If caused by growths in the nose, surgery may be needed. Follow these instructions at home: Medicines Take, use, or apply over-the-counter and prescription medicines only as told by your doctor. These may include nasal sprays. If you were prescribed an antibiotic medicine, take it as told by your doctor. Do not stop taking it even if you start to feel better. Hydrate and humidify  Drink enough water to keep your pee (urine) pale yellow. Use a cool mist humidifier to keep the humidity level in your home above 50%. Breathe in steam for 10-15 minutes, 3-4 times a day, or as told by your doctor. You can do this in the bathroom while a hot shower is running. Try not to spend time in cool or dry air. Rest Rest as much as you can. Sleep with your head raised (elevated). Make sure you get enough sleep each night. General instructions  Put a warm, moist  washcloth on your face 3-4 times a day, or as often as told by your doctor. Use nasal saline washes as often as told by your doctor. Wash your hands often with soap and water. If you cannot use soap and water, use hand sanitizer. Do not smoke. Avoid being around people who are smoking (secondhand smoke). Keep all follow-up visits. Contact a doctor if: You have a  fever. Your symptoms get worse. Your symptoms do not get better within 10 days. Get help right away if: You have a very bad headache. You cannot stop vomiting. You have very bad pain or swelling around your face or eyes. You have trouble seeing. You feel confused. Your neck is stiff. You have trouble breathing. These symptoms may be an emergency. Get help right away. Call 911. Do not wait to see if the symptoms will go away. Do not drive yourself to the hospital. Summary A sinus infection is swelling of your sinuses. Sinuses are hollow spaces in the bones around your face. This condition is caused by tissues in your nose that become inflamed or swollen. This traps germs. These can lead to infection. If you were prescribed an antibiotic medicine, take it as told by your doctor. Do not stop taking it even if you start to feel better. Keep all follow-up visits. This information is not intended to replace advice given to you by your health care provider. Make sure you discuss any questions you have with your health care provider. Document Revised: 01/07/2021 Document Reviewed: 01/07/2021 Elsevier Patient Education  2023 Elsevier Inc.    Preventive Care 49-44 Years Old, Female Preventive care refers to lifestyle choices and visits with your health care provider that can promote health and wellness. Preventive care visits are also called wellness exams. What can I expect for my preventive care visit? Counseling Your health care provider may ask you questions about your: Medical history, including: Past medical problems. Family medical history. Pregnancy history. Current health, including: Menstrual cycle. Method of birth control. Emotional well-being. Home life and relationship well-being. Sexual activity and sexual health. Lifestyle, including: Alcohol, nicotine or tobacco, and drug use. Access to firearms. Diet, exercise, and sleep habits. Work and work  Statistician. Sunscreen use. Safety issues such as seatbelt and bike helmet use. Physical exam Your health care provider will check your: Height and weight. These may be used to calculate your BMI (body mass index). BMI is a measurement that tells if you are at a healthy weight. Waist circumference. This measures the distance around your waistline. This measurement also tells if you are at a healthy weight and may help predict your risk of certain diseases, such as type 2 diabetes and high blood pressure. Heart rate and blood pressure. Body temperature. Skin for abnormal spots. What immunizations do I need?  Vaccines are usually given at various ages, according to a schedule. Your health care provider will recommend vaccines for you based on your age, medical history, and lifestyle or other factors, such as travel or where you work. What tests do I need? Screening Your health care provider may recommend screening tests for certain conditions. This may include: Lipid and cholesterol levels. Diabetes screening. This is done by checking your blood sugar (glucose) after you have not eaten for a while (fasting). Pelvic exam and Pap test. Hepatitis B test. Hepatitis C test. HIV (human immunodeficiency virus) test. STI (sexually transmitted infection) testing, if you are at risk. Lung cancer screening. Colorectal cancer screening. Mammogram. Talk with your health care  provider about when you should start having regular mammograms. This may depend on whether you have a family history of breast cancer. BRCA-related cancer screening. This may be done if you have a family history of breast, ovarian, tubal, or peritoneal cancers. Bone density scan. This is done to screen for osteoporosis. Talk with your health care provider about your test results, treatment options, and if necessary, the need for more tests. Follow these instructions at home: Eating and drinking  Eat a diet that includes fresh  fruits and vegetables, whole grains, lean protein, and low-fat dairy products. Take vitamin and mineral supplements as recommended by your health care provider. Do not drink alcohol if: Your health care provider tells you not to drink. You are pregnant, may be pregnant, or are planning to become pregnant. If you drink alcohol: Limit how much you have to 0-1 drink a day. Know how much alcohol is in your drink. In the [place] one drink equals one 12 oz bottle of beer (355 mL), one 5 oz glass of wine (148 mL), or one 1 oz glass of hard liquor (44 mL). Lifestyle Brush your teeth every morning and night with fluoride toothpaste. Floss one time each day. Exercise for at least 30 minutes 5 or more days each week. Do not use any products that contain nicotine or tobacco. These products include cigarettes, chewing tobacco, and vaping devices, such as e-cigarettes. If you need help quitting, ask your health care provider. Do not use drugs. If you are sexually active, practice safe sex. Use a condom or other form of protection to prevent STIs. If you do not wish to become pregnant, use a form of birth control. If you plan to become pregnant, see your health care provider for a prepregnancy visit. Take aspirin only as told by your health care provider. Make sure that you understand how much to take and what form to take. Work with your health care provider to find out whether it is safe and beneficial for you to take aspirin daily. Find healthy ways to manage stress, such as: Meditation, yoga, or listening to music. Journaling. Talking to a trusted person. Spending time with friends and family. Minimize exposure to UV radiation to reduce your risk of skin cancer. Safety Always wear your seat belt while driving or riding in a vehicle. Do not drive: If you have been drinking alcohol. Do not ride with someone who has been drinking. When you are tired or distracted. While texting. If you have been using  any mind-altering substances or drugs. Wear a helmet and other protective equipment during sports activities. If you have firearms in your house, make sure you follow all gun safety procedures. Seek help if you have been physically or sexually abused. What's next? Visit your health care provider once a year for an annual wellness visit. Ask your health care provider how often you should have your eyes and teeth checked. Stay up to date on all vaccines. This information is not intended to replace advice given to you by your health care provider. Make sure you discuss any questions you have with your health care provider. Document Revised: 07/31/2020 Document Reviewed: 07/31/2020 Elsevier Patient Education  [place].

## 2021-10-28 NOTE — Progress Notes (Signed)
Subjective:  Patient ID: Mary Franco, female    DOB: April 05, 1965  Age: 56 y.o. MRN: [id number]  CC: HTN Hyperlipidemia GERD  HPI  Pt presents for follow-up of hypertension, hyperlipidemia, and Vit D deficiency. Pt was seen last week for GERD. States symptoms of GERD are well-controlled. States she is experiencing left ear pain, sinus congestion, post-nasal-drip, and sore throat. Onset of symptoms was three days ago. Pt was recently treated with a course of Amoxicillin.    Hypertension, follow-up: She was last seen for hypertension 13 months ago.  BP at that visit was 118/62. Management includes norvasc, zebeta and losartan She reports fair compliance with treatment. Pt has not had medications this morning, works night shift. BP elevated at 150/100. Pt seen in office on 10/09/21 BP elevated 130/70. Pt denies headache, blurred vision, or dizziness. She is not having side effects.  She is following a Regular diet. She is not exercising. She does smoke. Use of agents associated with hypertension: NSAIDS.    Pertinent labs: Lab Results  Component Value Date   CHOL 135 10/16/2020   HDL 37 (L) 10/16/2020   LDLCALC 71 10/16/2020   TRIG 158 (H) 10/16/2020   CHOLHDL 3.6 10/16/2020   Lab Results  Component Value Date   NA 143 04/29/2021   K 4.1 04/29/2021   CREATININE 0.74 04/29/2021   EGFR 94 04/29/2021   GFRNONAA 76 11/21/2018   GLUCOSE 122 (H) 04/29/2021     Lipid/Cholesterol, Follow-up  Last lipid panel Other pertinent labs  Lab Results  Component Value Date   CHOL 135 10/16/2020   HDL 37 (L) 10/16/2020   LDLCALC 71 10/16/2020   TRIG 158 (H) 10/16/2020   CHOLHDL 3.6 10/16/2020   Lab Results  Component Value Date   ALT 13 04/29/2021   AST 19 04/29/2021   PLT 388 04/29/2021   TSH 1.040 04/29/2021      Management includes Crestor. She reports excellent compliance with treatment. She  having side effects.   GERD, Follow up:  Current treatment consist of:  Protonix  and nexium She reports excellent compliance with treatment. She is not having side effects. . She is NOT experiencing difficulty swallowing  Vitamin D deficiency, follow-up  Lab Results  Component Value Date   VD25OH 9.2 (L) 04/29/2021   VD25OH 24.0 (L) 10/16/2020   CALCIUM 8.6 (L) 04/29/2021   CALCIUM 8.0 (L) 10/16/2020   Wt Readings from Last 3 Encounters:  10/28/21 148 lb (67.1 kg)  10/23/21 148 lb (67.1 kg)  10/09/21 150 lb (68 kg)    Management since that visit includes vitamin d weekly. She reports excellent compliance with treatment. She is not having side effects.        Current Outpatient Medications on File Prior to Visit  Medication Sig Dispense Refill   albuterol (VENTOLIN HFA) 108 (90 Base) MCG/ACT inhaler Inhale 2 puffs into the lungs every 6 (six) hours as needed for wheezing or shortness of breath. 8 g 3   alprazolam (XANAX) 2 MG tablet Take 2 mg by mouth at bedtime as needed.     amLODipine (NORVASC) 5 MG tablet Take 1 tablet (5 mg total) by mouth daily. 90 tablet 0   bisoprolol (ZEBETA) 5 MG tablet Take 1 tablet by mouth once daily 90 tablet 0   budesonide-formoterol (SYMBICORT) 160-4.5 MCG/ACT inhaler Inhale 2 puffs into the lungs 2 (two) times daily. 1 each 3   esomeprazole (NEXIUM) 40 MG capsule Take 40 mg by mouth daily at  12 noon.     gabapentin (NEURONTIN) 300 MG capsule Take 1 capsule (300 mg total) by mouth at bedtime. 30 capsule 1   losartan (COZAAR) 50 MG tablet Take 1 tablet (50 mg total) by mouth daily. 90 tablet 1   Magnesium 500 MG TABS Take 1 tablet by mouth daily.     ondansetron (ZOFRAN) 4 MG tablet Take 1 tablet (4 mg total) by mouth every 8 (eight) hours as needed for nausea or vomiting. 30 tablet 1   pantoprazole (PROTONIX) 40 MG tablet Take 1 tablet (40 mg total) by mouth daily. 90 tablet 0   rosuvastatin (CRESTOR) 20 MG tablet Take 1 tablet (20 mg total) by mouth daily. 90 tablet 0   Varenicline Tartrate, Starter, (CHANTIX  STARTING MONTH PAK) 0.5 MG X 11 & 1 MG X 42 TBPK Take 0.5 mg by mouth daily for 3 days, THEN 0.5 mg 2 (two) times daily for 4 days, THEN 1 mg 2 (two) times daily for 21 days. 1 each 0   Vitamin D, Ergocalciferol, (DRISDOL) 1.25 MG (50000 UNIT) CAPS capsule Take 1 capsule (50,000 Units total) by mouth 2 (two) times a week. 10 capsule 2   No current facility-administered medications on file prior to visit.   Past Medical History:  Diagnosis Date   Alcohol abuse    Anxiety    ASD (atrial septal defect) 07/19/2018   Asthma    Cervical strain 04/16/2014   Cervicogenic headache 04/16/2014   Chronic combined systolic and diastolic CHF (congestive heart failure) ([place]) 03/25/2018   Echo 02/2018: EF 40-45 // Echo 07/2018:  EF 55-60, trivial AI, small secundum ASD (L>R shunting).   COPD (chronic obstructive pulmonary disease) ([facility])    Fibromyalgia    Migraines    Past Surgical History:  Procedure Laterality Date   APPENDECTOMY     KNEE SURGERY Left    RIGHT/LEFT HEART CATH AND CORONARY ANGIOGRAPHY N/A 03/08/2018   Procedure: RIGHT/LEFT HEART CATH AND CORONARY ANGIOGRAPHY;  Surgeon: Lorretta Harp, MD;  Location: [facility];  Service: Cardiovascular;  Laterality: N/A;   TUBAL LIGATION      Family History  Problem Relation Age of Onset   Cancer Mother    Colitis Paternal Aunt    Ovarian cancer Maternal Grandmother    Social History   Socioeconomic History   Marital status: Divorced    Spouse name: Not on file   Number of children: 2   Years of education: Not on file   Highest education level: Not on file  Occupational History   Occupation: Textiles  Tobacco Use   Smoking status: Every Day    Packs/day: 1.50    Years: 40.00    Total pack years: 60.00    Types: Cigarettes   Smokeless tobacco: Never  Vaping Use   Vaping Use: Never used  Substance and Sexual Activity   Alcohol use: Yes    Comment: 2 x per week   Drug use: No   Sexual activity: Not Currently  Other Topics  Concern   Not on file  Social History Narrative   ** Merged History Encounter **   Patient is right handed.   Patient drinks 3 cups caffeine daily.   Working on 2nd shift now at her job.  08-15-14          Social Determinants of Radio broadcast assistant Strain: Not on file  Food Insecurity: Not on file  Transportation Needs: Not on file  Physical Activity:  Not on file  Stress: Not on file  Social Connections: Not on file    Review of Systems  Constitutional:  Negative for chills, fatigue and fever.  HENT:  Negative for congestion, ear pain, rhinorrhea and sore throat.   Respiratory:  Negative for cough and shortness of breath.   Cardiovascular:  Negative for chest pain.  Gastrointestinal:  Negative for abdominal pain, constipation, diarrhea, nausea and vomiting.  Genitourinary:  Negative for dysuria and urgency.  Musculoskeletal:  Negative for back pain and myalgias.  Neurological:  Negative for dizziness, weakness, light-headedness and headaches.  Psychiatric/Behavioral:  Negative for dysphoric mood. The patient is not nervous/anxious.      Objective:  BP (!) 150/100   Pulse 84   Temp (!) 97.1 F (36.2 C)   Ht _0  (1.6 m)   Wt 148 lb (67.1 kg)   SpO2 95%   BMI 26.22 kg/m       10/28/2021    7:39 AM 10/23/2021    2:56 PM 10/09/2021    4:01 PM  BP/Weight  Systolic BP  177 939  Diastolic BP  82 70  Wt. (Lbs) 148 148 150  BMI 26.22 kg/m2 27.07 kg/m2 27.44 kg/m2    Physical Exam Vitals reviewed.  Constitutional:      Appearance: Normal appearance.  HENT:     Right Ear: Tympanic membrane normal.     Left Ear: Tenderness present. Tympanic membrane is erythematous.     Nose: Congestion and rhinorrhea present.     Right Sinus: Maxillary sinus tenderness and frontal sinus tenderness present.     Left Sinus: Maxillary sinus tenderness and frontal sinus tenderness present.     Mouth/Throat:     Mouth: Mucous membranes are dry.     Pharynx: Posterior  oropharyngeal erythema present.  Cardiovascular:     Rate and Rhythm: Normal rate and regular rhythm.     Pulses: Normal pulses.     Heart sounds: Normal heart sounds.  Pulmonary:     Effort: Pulmonary effort is normal.     Breath sounds: Normal breath sounds.  Abdominal:     General: Bowel sounds are normal.     Palpations: Abdomen is soft.  Skin:    General: Skin is warm and dry.     Capillary Refill: Capillary refill takes less than 2 seconds.  Neurological:     General: No focal deficit present.     Mental Status: She is alert and oriented to person, place, and time.  Psychiatric:        Mood and Affect: Mood normal.        Behavior: Behavior normal.     Lab Results  Component Value Date   WBC 6.1 04/29/2021   HGB 14.5 04/29/2021   HCT 44.4 04/29/2021   PLT 388 04/29/2021   GLUCOSE 122 (H) 04/29/2021   CHOL 135 10/16/2020   TRIG 158 (H) 10/16/2020   HDL 37 (L) 10/16/2020   LDLCALC 71 10/16/2020   ALT 13 04/29/2021   AST 19 04/29/2021   NA 143 04/29/2021   K 4.1 04/29/2021   CL 100 04/29/2021   CREATININE 0.74 04/29/2021   BUN 7 04/29/2021   CO2 24 04/29/2021   TSH 1.040 04/29/2021      Assessment & Plan:   1. Essential hypertension-not at goal - CBC with Differential/Platelet - Comprehensive metabolic panel -continue Zebeta 5 mg, Cozaar 50 mg, and Norvasc 5 mg QD  2. Mixed hyperlipidemia-not at goal - CBC with  Differential/Platelet - Comprehensive metabolic panel - Lipid panel -continue Crestor 20 mg QD  3. Vitamin D deficiency-not at goal - VITAMIN D 25 Hydroxy (Vit-D Deficiency, Fractures) -Vit D rich diet  4. COPD GOLD II/ active smoker-stable - CBC with Differential/Platelet - Comprehensive metabolic panel -recommend smoking cessation, pt awaiting Chantix prescription to be filled -Trelegy inhaler as prescribed  5. Gastroesophageal reflux disease, unspecified whether esophagitis present-well controlled - CBC with Differential/Platelet -  Comprehensive metabolic panel - Lipid panel - treatment has included Protonix 40 mg QD -avoid foods that trigger GERD   6. Other acute recurrent sinusitis - amoxicillin-clavulanate (AUGMENTIN) 875-125 MG tablet; Take 1 tablet by mouth 2 (two) times daily. Take with food  Dispense: 20 tablet; Refill: 0  7. Vaccine refused by patient -pt declines flu vaccine    Continue medications We will call you with lab results Take Augmentin twice daily for 10 days for sinusitis Follow-up in 6-month,fasting     Follow-up: 643-month fasting  An After Visit Summary was printed and given to the patient.  I, ShRip HarbourNP, have reviewed all documentation for this visit. The documentation on 10/28/21 for the exam, diagnosis, procedures, and orders are all accurate and complete.    Signed, ShRip HarbourNP [phone]

## 2021-10-29 LAB — COMPREHENSIVE METABOLIC PANEL
ALT: 13 IU/L (ref 0–32)
AST: 12 IU/L (ref 0–40)
Albumin/Globulin Ratio: 1.9 (ref 1.2–2.2)
Albumin: 4.3 g/dL (ref 3.8–4.9)
Alkaline Phosphatase: 104 IU/L (ref 44–121)
BUN/Creatinine Ratio: 14 (ref 9–23)
BUN: 10 mg/dL (ref 6–24)
Bilirubin Total: 0.2 mg/dL (ref 0.0–1.2)
CO2: 24 mmol/L (ref 20–29)
Calcium: 8.5 mg/dL — ABNORMAL LOW (ref 8.7–10.2)
Chloride: 103 mmol/L (ref 96–106)
Creatinine, Ser: 0.73 mg/dL (ref 0.57–1.00)
Globulin, Total: 2.3 g/dL (ref 1.5–4.5)
Glucose: 92 mg/dL (ref 70–99)
Potassium: 3.6 mmol/L (ref 3.5–5.2)
Sodium: 142 mmol/L (ref 134–144)
Total Protein: 6.6 g/dL (ref 6.0–8.5)
eGFR: 96 mL/min/{1.73_m2} (ref >59)

## 2021-10-29 LAB — LIPID PANEL
Chol/HDL Ratio: 4 ratio (ref 0.0–4.4)
Cholesterol, Total: 185 mg/dL (ref 100–199)
HDL: 46 mg/dL (ref >39)
LDL Chol Calc (NIH): 112 mg/dL — ABNORMAL HIGH (ref 0–99)
Triglycerides: 155 mg/dL — ABNORMAL HIGH (ref 0–149)
VLDL Cholesterol Cal: 27 mg/dL (ref 5–40)

## 2021-10-29 LAB — CBC WITH DIFFERENTIAL/PLATELET
Basophils Absolute: 0 10*3/uL (ref 0.0–0.2)
Basos: 1 %
EOS (ABSOLUTE): 0.1 10*3/uL (ref 0.0–0.4)
Eos: 1 %
Hematocrit: 39.4 % (ref 34.0–46.6)
Hemoglobin: 12.9 g/dL (ref 11.1–15.9)
Immature Grans (Abs): 0 10*3/uL (ref 0.0–0.1)
Immature Granulocytes: 0 %
Lymphocytes Absolute: 2 10*3/uL (ref 0.7–3.1)
Lymphs: 31 %
MCH: 29.7 pg (ref 26.6–33.0)
MCHC: 32.7 g/dL (ref 31.5–35.7)
MCV: 91 fL (ref 79–97)
Monocytes Absolute: 0.5 10*3/uL (ref 0.1–0.9)
Monocytes: 8 %
Neutrophils Absolute: 3.8 10*3/uL (ref 1.4–7.0)
Neutrophils: 59 %
Platelets: 343 10*3/uL (ref 150–450)
RBC: 4.34 x10E6/uL (ref 3.77–5.28)
RDW: 13.6 % (ref 11.7–15.4)
WBC: 6.5 10*3/uL (ref 3.4–10.8)

## 2021-10-29 LAB — CARDIOVASCULAR RISK ASSESSMENT

## 2021-10-29 LAB — VITAMIN D 25 HYDROXY (VIT D DEFICIENCY, FRACTURES): Vit D, 25-Hydroxy: 47.4 ng/mL (ref 30.0–100.0)

## 2021-10-31 ENCOUNTER — Other Ambulatory Visit: Payer: Self-pay | Admitting: Nurse Practitioner

## 2021-10-31 DIAGNOSIS — I5042 Chronic combined systolic (congestive) and diastolic (congestive) heart failure: Secondary | ICD-10-CM

## 2021-10-31 DIAGNOSIS — I10 Essential (primary) hypertension: Secondary | ICD-10-CM

## 2021-10-31 DIAGNOSIS — Z79899 Other long term (current) drug therapy: Secondary | ICD-10-CM

## 2021-11-03 ENCOUNTER — Other Ambulatory Visit: Payer: Self-pay

## 2021-11-26 ENCOUNTER — Telehealth: Payer: Self-pay | Admitting: Nurse Practitioner

## 2021-11-26 NOTE — Telephone Encounter (Signed)
LMOVM TO SCHEDULE MAMMOGRAM ON MOBILE MAMMOGRAM BUS 

## 2021-12-11 DIAGNOSIS — K219 Gastro-esophageal reflux disease without esophagitis: Secondary | ICD-10-CM | POA: Diagnosis not present

## 2021-12-11 DIAGNOSIS — R195 Other fecal abnormalities: Secondary | ICD-10-CM | POA: Diagnosis not present

## 2021-12-18 ENCOUNTER — Encounter: Payer: Self-pay | Admitting: Nurse Practitioner

## 2021-12-18 ENCOUNTER — Ambulatory Visit: Payer: BC Managed Care – PPO | Admitting: Nurse Practitioner

## 2021-12-18 VITALS — BP 138/80 | HR 89 | Temp 97.2°F | Ht 63.0 in | Wt 149.0 lb

## 2021-12-18 DIAGNOSIS — J0181 Other acute recurrent sinusitis: Secondary | ICD-10-CM | POA: Diagnosis not present

## 2021-12-18 DIAGNOSIS — R051 Acute cough: Secondary | ICD-10-CM | POA: Diagnosis not present

## 2021-12-18 LAB — POC COVID19 BINAXNOW: SARS Coronavirus 2 Ag: NEGATIVE

## 2021-12-18 MED ORDER — BENZONATATE 100 MG PO CAPS: 100.0000 mg | ORAL_CAPSULE | Freq: Two times a day (BID) | ORAL | 0 refills | Status: DC | PRN

## 2021-12-18 MED ORDER — AMOXICILLIN-POT CLAVULANATE 875-125 MG PO TABS: 1.0000 | ORAL_TABLET | Freq: Two times a day (BID) | ORAL | 0 refills | Status: DC

## 2021-12-18 NOTE — Patient Instructions (Signed)
Take Augmentin twice daily for 10 days Continue inhalers as directed Take Tessalon perles as needed for cough Consider quitting smoking Rest and push fluids    Sinus Infection, Adult A sinus infection is soreness and swelling (inflammation) of your sinuses. Sinuses are hollow spaces in the bones around your face. They are located: Around your eyes. In the middle of your forehead. Behind your nose. In your cheekbones. Your sinuses and nasal passages are lined with a fluid called mucus. Mucus drains out of your sinuses. Swelling can trap mucus in your sinuses. This lets germs (bacteria, virus, or fungus) grow, which leads to infection. Most of the time, this condition is caused by a virus. What are the causes? Allergies. Asthma. Germs. Things that block your nose or sinuses. Growths in the nose (nasal polyps). Chemicals or irritants in the air. A fungus. This is rare. What increases the risk? Having a weak body defense system (immune system). Doing a lot of swimming or diving. Using nasal sprays too much. Smoking. What are the signs or symptoms? The main symptoms of this condition are pain and a feeling of pressure around the sinuses. Other symptoms include: Stuffy nose (congestion). This may make it hard to breathe through your nose. Runny nose (drainage). Soreness, swelling, and warmth in the sinuses. A cough that may get worse at night. Being unable to smell and taste. Mucus that collects in the throat or the back of the nose (postnasal drip). This may cause a sore throat or bad breath. Being very tired (fatigued). A fever. How is this diagnosed? Your symptoms. Your medical history. A physical exam. Tests to find out if your condition is short-term (acute) or long-term (chronic). Your doctor may: Check your nose for growths (polyps). Check your sinuses using a tool that has a light on one end (endoscope). Check for allergies or germs. Do imaging tests, such as an MRI or  CT scan. How is this treated? Treatment for this condition depends on the cause and whether it is short-term or long-term. If caused by a virus, your symptoms should go away on their own within 10 days. You may be given medicines to relieve symptoms. They include: Medicines that shrink swollen tissue in the nose. A spray that treats swelling of the nostrils. Rinses that help get rid of thick mucus in your nose (nasal saline washes). Medicines that treat allergies (antihistamines). Over-the-counter pain relievers. If caused by bacteria, your doctor may wait to see if you will get better without treatment. You may be given antibiotic medicine if you have: A very bad infection. A weak body defense system. If caused by growths in the nose, surgery may be needed. Follow these instructions at home: Medicines Take, use, or apply over-the-counter and prescription medicines only as told by your doctor. These may include nasal sprays. If you were prescribed an antibiotic medicine, take it as told by your doctor. Do not stop taking it even if you start to feel better. Hydrate and humidify  Drink enough water to keep your pee (urine) pale yellow. Use a cool mist humidifier to keep the humidity level in your home above 50%. Breathe in steam for 10-15 minutes, 3-4 times a day, or as told by your doctor. You can do this in the bathroom while a hot shower is running. Try not to spend time in cool or dry air. Rest Rest as much as you can. Sleep with your head raised (elevated). Make sure you get enough sleep each night. General instructions  Put a warm, moist washcloth on your face 3-4 times a day, or as often as told by your doctor. Use nasal saline washes as often as told by your doctor. Wash your hands often with soap and water. If you cannot use soap and water, use hand sanitizer. Do not smoke. Avoid being around people who are smoking (secondhand smoke). Keep all follow-up visits. Contact a  doctor if: You have a fever. Your symptoms get worse. Your symptoms do not get better within 10 days. Get help right away if: You have a very bad headache. You cannot stop vomiting. You have very bad pain or swelling around your face or eyes. You have trouble seeing. You feel confused. Your neck is stiff. You have trouble breathing. These symptoms may be an emergency. Get help right away. Call 911. Do not wait to see if the symptoms will go away. Do not drive yourself to the hospital. Summary A sinus infection is swelling of your sinuses. Sinuses are hollow spaces in the bones around your face. This condition is caused by tissues in your nose that become inflamed or swollen. This traps germs. These can lead to infection. If you were prescribed an antibiotic medicine, take it as told by your doctor. Do not stop taking it even if you start to feel better. Keep all follow-up visits. This information is not intended to replace advice given to you by your health care provider. Make sure you discuss any questions you have with your health care provider. Document Revised: 01/07/2021 Document Reviewed: 01/07/2021 Elsevier Patient Education  [place].

## 2021-12-18 NOTE — Progress Notes (Signed)
Acute Office Visit  Subjective:    Patient ID: Mary Franco, female    DOB: 1965-06-04, 56 y.o.   MRN: [id number]  Chief Complaint  Patient presents with   URI    HPI: Patient is in today for Upper respiratory symptoms She complains of chest congestion, nasal congestion, and productive cough with  green colored sputum.Denies fever, chills, night sweats or weight loss. Onset of symptoms was  12 days ago and staying constant.She is drinking plenty of fluids.  Past history is significant for occasional episodes of bronchitis. Patient is smoker. Has been taking Mucinex, using Symbicort and Albuterol inhalers daily.  Past Medical History:  Diagnosis Date   Alcohol abuse    Anxiety    ASD (atrial septal defect) 07/19/2018   Asthma    Cervical strain 04/16/2014   Cervicogenic headache 04/16/2014   Chronic combined systolic and diastolic CHF (congestive heart failure) ([place]) 03/25/2018   Echo 02/2018: EF 40-45 // Echo 07/2018:  EF 55-60, trivial AI, small secundum ASD (L>R shunting).   COPD (chronic obstructive pulmonary disease) (HCC)    Fibromyalgia    Migraines     Past Surgical History:  Procedure Laterality Date   APPENDECTOMY     KNEE SURGERY Left    RIGHT/LEFT HEART CATH AND CORONARY ANGIOGRAPHY N/A 03/08/2018   Procedure: RIGHT/LEFT HEART CATH AND CORONARY ANGIOGRAPHY;  Surgeon: Lorretta Harp, MD;  Location: [facility];  Service: Cardiovascular;  Laterality: N/A;   TUBAL LIGATION      Family History  Problem Relation Age of Onset   Cancer Mother    Colitis Paternal Aunt    Ovarian cancer Maternal Grandmother     Social History   Socioeconomic History   Marital status: Divorced    Spouse name: Not on file   Number of children: 2   Years of education: Not on file   Highest education level: Not on file  Occupational History   Occupation: Textiles  Tobacco Use   Smoking status: Every Day    Packs/day: 1.50    Years: 40.00    Total pack years: 60.00     Types: Cigarettes   Smokeless tobacco: Never  Vaping Use   Vaping Use: Never used  Substance and Sexual Activity   Alcohol use: Yes    Comment: 2 x per week   Drug use: No   Sexual activity: Not Currently  Other Topics Concern   Not on file  Social History Narrative   ** Merged History Encounter **   Patient is right handed.   Patient drinks 3 cups caffeine daily.   Working on 2nd shift now at her job.  08-15-14          Social Determinants of Health   Financial Resource Strain: Low Risk  (10/28/2021)   Overall Financial Resource Strain (CARDIA)    Difficulty of Paying Living Expenses: Not hard at all  Food Insecurity: No Food Insecurity (10/28/2021)   Hunger Vital Sign    Worried About Running Out of Food in the Last Year: Never true    [place] in the Last Year: Never true  Transportation Needs: No Transportation Needs (10/28/2021)   PRAPARE - Hydrologist (Medical): No    Lack of Transportation (Non-Medical): No  Physical Activity: Inactive (10/28/2021)   Exercise Vital Sign    Days of Exercise per Week: 0 days    Minutes of Exercise per Session: 0 min  Stress: Stress Concern Present (10/28/2021)   [place]    Feeling of Stress : Very much  Social Connections: Socially Isolated (10/28/2021)   Social Connection and Isolation Panel [NHANES]    Frequency of Communication with Friends and Family: More than three times a week    Frequency of Social Gatherings with Friends and Family: More than three times a week    Attends Religious Services: Never    Marine scientist or Organizations: No    Attends Archivist Meetings: Never    Marital Status: Divorced  Human resources officer Violence: Not At Risk (10/28/2021)   Humiliation, Afraid, Rape, and Kick questionnaire    Fear of Current or Ex-Partner: No    Emotionally Abused: No    Physically Abused: No    Sexually  Abused: No    Outpatient Medications Prior to Visit  Medication Sig Dispense Refill   albuterol (VENTOLIN HFA) 108 (90 Base) MCG/ACT inhaler Inhale 2 puffs into the lungs every 6 (six) hours as needed for wheezing or shortness of breath. 8 g 3   alprazolam (XANAX) 2 MG tablet Take 2 mg by mouth at bedtime as needed.     amLODipine (NORVASC) 5 MG tablet Take 1 tablet (5 mg total) by mouth daily. 90 tablet 0   bisoprolol (ZEBETA) 5 MG tablet Take 1 tablet by mouth once daily 90 tablet 0   budesonide-formoterol (SYMBICORT) 160-4.5 MCG/ACT inhaler Inhale 2 puffs into the lungs 2 (two) times daily. 1 each 3   esomeprazole (NEXIUM) 40 MG capsule Take 40 mg by mouth daily at 12 noon.     Fluticasone-Umeclidin-Vilant (TRELEGY ELLIPTA) 100-62.5-25 MCG/ACT AEPB Inhale 1 puff into the lungs daily. Rinse mouth afterwards 2 each 0   Fluticasone-Umeclidin-Vilant (TRELEGY ELLIPTA) 100-62.5-25 MCG/ACT AEPB Inhale 1 puff into the lungs daily. 1 each 11   gabapentin (NEURONTIN) 300 MG capsule Take 1 capsule (300 mg total) by mouth at bedtime. 30 capsule 1   losartan (COZAAR) 50 MG tablet Take 1 tablet by mouth once daily 90 tablet 0   Magnesium 500 MG TABS Take 1 tablet by mouth daily.     ondansetron (ZOFRAN) 4 MG tablet Take 1 tablet (4 mg total) by mouth every 8 (eight) hours as needed for nausea or vomiting. 30 tablet 1   pantoprazole (PROTONIX) 40 MG tablet Take 1 tablet (40 mg total) by mouth daily. 90 tablet 0   rosuvastatin (CRESTOR) 20 MG tablet Take 1 tablet (20 mg total) by mouth daily. 90 tablet 0   Vitamin D, Ergocalciferol, (DRISDOL) 1.25 MG (50000 UNIT) CAPS capsule Take 1 capsule (50,000 Units total) by mouth 2 (two) times a week. 10 capsule 2   amoxicillin-clavulanate (AUGMENTIN) 875-125 MG tablet Take 1 tablet by mouth 2 (two) times daily. Take with food 20 tablet 0   No facility-administered medications prior to visit.    Allergies  Allergen Reactions   Codeine Itching   Sulfa  Antibiotics Itching   Spironolactone     Electrolyte imbalance    Review of Systems See pertinent positives and negatives per HPI.     Objective:    Physical Exam Vitals reviewed.  Constitutional:      Appearance: She is ill-appearing.  HENT:     Right Ear: Tympanic membrane normal.     Left Ear: Tympanic membrane normal.     Nose: Congestion and rhinorrhea present.     Mouth/Throat:     Pharynx: Posterior  oropharyngeal erythema present.  Eyes:     Pupils: Pupils are equal, round, and reactive to light.     Comments: Glasses in place  Cardiovascular:     Rate and Rhythm: Normal rate and regular rhythm.     Pulses: Normal pulses.     Heart sounds: Normal heart sounds.  Pulmonary:     Breath sounds: Wheezing and rhonchi present.  Skin:    Capillary Refill: Capillary refill takes less than 2 seconds.  Neurological:     Mental Status: She is alert.      Pulse 89   Temp (!) 97.2 F (36.2 C)   Ht _0  (1.6 m)   Wt 149 lb (67.6 kg)   SpO2 99%   BMI 26.39 kg/m  Wt Readings from Last 3 Encounters:  12/18/21 149 lb (67.6 kg)  10/28/21 148 lb (67.1 kg)  10/23/21 148 lb (67.1 kg)    Health Maintenance Due  Topic Date Due   Lung Cancer Screening  Never done    Lab Results  Component Value Date   TSH 1.040 04/29/2021   Lab Results  Component Value Date   WBC 6.5 10/28/2021   HGB 12.9 10/28/2021   HCT 39.4 10/28/2021   MCV 91 10/28/2021   PLT 343 10/28/2021   Lab Results  Component Value Date   NA 142 10/28/2021   K 3.6 10/28/2021   CO2 24 10/28/2021   GLUCOSE 92 10/28/2021   BUN 10 10/28/2021   CREATININE 0.73 10/28/2021   BILITOT 0.2 10/28/2021   ALKPHOS 104 10/28/2021   AST 12 10/28/2021   ALT 13 10/28/2021   PROT 6.6 10/28/2021   ALBUMIN 4.3 10/28/2021   CALCIUM 8.5 (L) 10/28/2021   ANIONGAP 11 03/09/2018   EGFR 96 10/28/2021   GFR 75.48 05/24/2019   Lab Results  Component Value Date   CHOL 185 10/28/2021   Lab Results  Component Value  Date   HDL 46 10/28/2021   Lab Results  Component Value Date   LDLCALC 112 (H) 10/28/2021   Lab Results  Component Value Date   TRIG 155 (H) 10/28/2021   Lab Results  Component Value Date   CHOLHDL 4.0 10/28/2021        Assessment & Plan:   1. Other acute recurrent sinusitis - amoxicillin-clavulanate (AUGMENTIN) 875-125 MG tablet; Take 1 tablet by mouth 2 (two) times daily.  Dispense: 20 tablet; Refill: 0  2. Acute cough - POC COVID-19 BinaxNow-NEGATIVE - benzonatate (TESSALON) 100 MG capsule; Take 1 capsule (100 mg total) by mouth 2 (two) times daily as needed for cough.  Dispense: 20 capsule; Refill: 0     Take Augmentin twice daily for 10 days Continue inhalers as directed Take Tessalon perles as needed for cough Consider quitting smoking Rest and push fluids   Follow-up: PRN  An After Visit Summary was printed and given to the patient.  I, Rip Harbour, NP, have reviewed all documentation for this visit. The documentation on 12/18/21 for the exam, diagnosis, procedures, and orders are all accurate and complete.    Signed, Rip Harbour, NP [place] [phone]

## 2021-12-23 DIAGNOSIS — Z124 Encounter for screening for malignant neoplasm of cervix: Secondary | ICD-10-CM | POA: Diagnosis not present

## 2021-12-23 DIAGNOSIS — Z6827 Body mass index (BMI) 27.0-27.9, adult: Secondary | ICD-10-CM | POA: Diagnosis not present

## 2021-12-23 DIAGNOSIS — Z01419 Encounter for gynecological examination (general) (routine) without abnormal findings: Secondary | ICD-10-CM | POA: Diagnosis not present

## 2021-12-23 LAB — HM PAP SMEAR: HM Pap smear: NEGATIVE

## 2021-12-23 LAB — RESULTS CONSOLE HPV: CHL HPV: NEGATIVE

## 2022-01-11 ENCOUNTER — Other Ambulatory Visit: Payer: Self-pay | Admitting: Nurse Practitioner

## 2022-01-11 DIAGNOSIS — K219 Gastro-esophageal reflux disease without esophagitis: Secondary | ICD-10-CM

## 2022-01-15 DIAGNOSIS — D122 Benign neoplasm of ascending colon: Secondary | ICD-10-CM | POA: Diagnosis not present

## 2022-01-15 DIAGNOSIS — Z1211 Encounter for screening for malignant neoplasm of colon: Secondary | ICD-10-CM | POA: Diagnosis not present

## 2022-01-15 DIAGNOSIS — K219 Gastro-esophageal reflux disease without esophagitis: Secondary | ICD-10-CM | POA: Diagnosis not present

## 2022-01-15 DIAGNOSIS — D124 Benign neoplasm of descending colon: Secondary | ICD-10-CM | POA: Diagnosis not present

## 2022-01-15 DIAGNOSIS — K591 Functional diarrhea: Secondary | ICD-10-CM | POA: Diagnosis not present

## 2022-01-15 DIAGNOSIS — Z86010 Personal history of colonic polyps: Secondary | ICD-10-CM | POA: Diagnosis not present

## 2022-01-15 DIAGNOSIS — K449 Diaphragmatic hernia without obstruction or gangrene: Secondary | ICD-10-CM | POA: Diagnosis not present

## 2022-01-15 LAB — HM COLONOSCOPY

## 2022-02-08 DIAGNOSIS — I10 Essential (primary) hypertension: Secondary | ICD-10-CM | POA: Diagnosis not present

## 2022-02-08 DIAGNOSIS — I700 Atherosclerosis of aorta: Secondary | ICD-10-CM | POA: Diagnosis not present

## 2022-02-08 DIAGNOSIS — I672 Cerebral atherosclerosis: Secondary | ICD-10-CM | POA: Diagnosis not present

## 2022-02-08 DIAGNOSIS — R252 Cramp and spasm: Secondary | ICD-10-CM | POA: Diagnosis not present

## 2022-02-08 DIAGNOSIS — M47816 Spondylosis without myelopathy or radiculopathy, lumbar region: Secondary | ICD-10-CM | POA: Diagnosis not present

## 2022-02-08 DIAGNOSIS — I6523 Occlusion and stenosis of bilateral carotid arteries: Secondary | ICD-10-CM | POA: Diagnosis not present

## 2022-02-08 DIAGNOSIS — R200 Anesthesia of skin: Secondary | ICD-10-CM | POA: Diagnosis not present

## 2022-02-08 DIAGNOSIS — I771 Stricture of artery: Secondary | ICD-10-CM | POA: Diagnosis not present

## 2022-02-08 DIAGNOSIS — F1590 Other stimulant use, unspecified, uncomplicated: Secondary | ICD-10-CM | POA: Diagnosis not present

## 2022-02-08 DIAGNOSIS — R531 Weakness: Secondary | ICD-10-CM | POA: Diagnosis not present

## 2022-02-09 DIAGNOSIS — I672 Cerebral atherosclerosis: Secondary | ICD-10-CM | POA: Diagnosis not present

## 2022-02-09 DIAGNOSIS — F32A Depression, unspecified: Secondary | ICD-10-CM | POA: Diagnosis not present

## 2022-02-09 DIAGNOSIS — I6300 Cerebral infarction due to thrombosis of unspecified precerebral artery: Secondary | ICD-10-CM | POA: Diagnosis not present

## 2022-02-09 DIAGNOSIS — M47816 Spondylosis without myelopathy or radiculopathy, lumbar region: Secondary | ICD-10-CM | POA: Diagnosis not present

## 2022-02-09 DIAGNOSIS — E876 Hypokalemia: Secondary | ICD-10-CM | POA: Diagnosis not present

## 2022-02-09 DIAGNOSIS — I6523 Occlusion and stenosis of bilateral carotid arteries: Secondary | ICD-10-CM | POA: Diagnosis not present

## 2022-02-09 DIAGNOSIS — I771 Stricture of artery: Secondary | ICD-10-CM | POA: Diagnosis not present

## 2022-02-09 DIAGNOSIS — Z720 Tobacco use: Secondary | ICD-10-CM | POA: Diagnosis not present

## 2022-02-09 DIAGNOSIS — E538 Deficiency of other specified B group vitamins: Secondary | ICD-10-CM | POA: Diagnosis not present

## 2022-02-09 DIAGNOSIS — F1590 Other stimulant use, unspecified, uncomplicated: Secondary | ICD-10-CM | POA: Diagnosis not present

## 2022-02-09 DIAGNOSIS — I6389 Other cerebral infarction: Secondary | ICD-10-CM | POA: Diagnosis not present

## 2022-02-09 DIAGNOSIS — J431 Panlobular emphysema: Secondary | ICD-10-CM | POA: Diagnosis not present

## 2022-02-09 DIAGNOSIS — R252 Cramp and spasm: Secondary | ICD-10-CM | POA: Diagnosis not present

## 2022-02-09 DIAGNOSIS — E785 Hyperlipidemia, unspecified: Secondary | ICD-10-CM | POA: Diagnosis not present

## 2022-02-09 DIAGNOSIS — I639 Cerebral infarction, unspecified: Secondary | ICD-10-CM | POA: Diagnosis not present

## 2022-02-09 DIAGNOSIS — E559 Vitamin D deficiency, unspecified: Secondary | ICD-10-CM | POA: Diagnosis not present

## 2022-02-09 DIAGNOSIS — F419 Anxiety disorder, unspecified: Secondary | ICD-10-CM | POA: Diagnosis not present

## 2022-02-09 DIAGNOSIS — I671 Cerebral aneurysm, nonruptured: Secondary | ICD-10-CM | POA: Diagnosis not present

## 2022-02-09 DIAGNOSIS — K589 Irritable bowel syndrome without diarrhea: Secondary | ICD-10-CM | POA: Diagnosis not present

## 2022-02-09 DIAGNOSIS — R531 Weakness: Secondary | ICD-10-CM | POA: Diagnosis not present

## 2022-02-09 DIAGNOSIS — J449 Chronic obstructive pulmonary disease, unspecified: Secondary | ICD-10-CM | POA: Diagnosis not present

## 2022-02-09 DIAGNOSIS — F17210 Nicotine dependence, cigarettes, uncomplicated: Secondary | ICD-10-CM | POA: Diagnosis not present

## 2022-02-09 DIAGNOSIS — R200 Anesthesia of skin: Secondary | ICD-10-CM | POA: Diagnosis not present

## 2022-02-09 DIAGNOSIS — I10 Essential (primary) hypertension: Secondary | ICD-10-CM | POA: Diagnosis not present

## 2022-02-09 DIAGNOSIS — K219 Gastro-esophageal reflux disease without esophagitis: Secondary | ICD-10-CM | POA: Diagnosis not present

## 2022-02-09 DIAGNOSIS — F1910 Other psychoactive substance abuse, uncomplicated: Secondary | ICD-10-CM | POA: Diagnosis not present

## 2022-02-09 DIAGNOSIS — F1290 Cannabis use, unspecified, uncomplicated: Secondary | ICD-10-CM | POA: Diagnosis not present

## 2022-02-09 DIAGNOSIS — R002 Palpitations: Secondary | ICD-10-CM | POA: Diagnosis not present

## 2022-02-09 DIAGNOSIS — I700 Atherosclerosis of aorta: Secondary | ICD-10-CM | POA: Diagnosis not present

## 2022-02-10 DIAGNOSIS — I6389 Other cerebral infarction: Secondary | ICD-10-CM | POA: Diagnosis not present

## 2022-02-11 ENCOUNTER — Ambulatory Visit: Payer: BC Managed Care – PPO | Attending: Cardiology

## 2022-02-11 ENCOUNTER — Other Ambulatory Visit: Payer: Self-pay

## 2022-02-11 ENCOUNTER — Ambulatory Visit: Payer: BC Managed Care – PPO | Attending: Cardiology | Admitting: Cardiology

## 2022-02-11 VITALS — BP 134/84 | HR 100 | Ht 62.6 in | Wt 146.2 lb

## 2022-02-11 DIAGNOSIS — R002 Palpitations: Secondary | ICD-10-CM

## 2022-02-11 DIAGNOSIS — I639 Cerebral infarction, unspecified: Secondary | ICD-10-CM

## 2022-02-11 DIAGNOSIS — J431 Panlobular emphysema: Secondary | ICD-10-CM | POA: Diagnosis not present

## 2022-02-11 DIAGNOSIS — F419 Anxiety disorder, unspecified: Secondary | ICD-10-CM | POA: Insufficient documentation

## 2022-02-11 DIAGNOSIS — E782 Mixed hyperlipidemia: Secondary | ICD-10-CM

## 2022-02-11 DIAGNOSIS — F1590 Other stimulant use, unspecified, uncomplicated: Secondary | ICD-10-CM

## 2022-02-11 DIAGNOSIS — E785 Hyperlipidemia, unspecified: Secondary | ICD-10-CM | POA: Insufficient documentation

## 2022-02-11 DIAGNOSIS — J45909 Unspecified asthma, uncomplicated: Secondary | ICD-10-CM | POA: Insufficient documentation

## 2022-02-11 DIAGNOSIS — Z720 Tobacco use: Secondary | ICD-10-CM

## 2022-02-11 DIAGNOSIS — I10 Essential (primary) hypertension: Secondary | ICD-10-CM | POA: Diagnosis not present

## 2022-02-11 DIAGNOSIS — F17210 Nicotine dependence, cigarettes, uncomplicated: Secondary | ICD-10-CM

## 2022-02-11 DIAGNOSIS — G43909 Migraine, unspecified, not intractable, without status migrainosus: Secondary | ICD-10-CM | POA: Insufficient documentation

## 2022-02-11 DIAGNOSIS — R29898 Other symptoms and signs involving the musculoskeletal system: Secondary | ICD-10-CM

## 2022-02-11 DIAGNOSIS — E559 Vitamin D deficiency, unspecified: Secondary | ICD-10-CM | POA: Insufficient documentation

## 2022-02-11 DIAGNOSIS — K219 Gastro-esophageal reflux disease without esophagitis: Secondary | ICD-10-CM | POA: Insufficient documentation

## 2022-02-11 DIAGNOSIS — E538 Deficiency of other specified B group vitamins: Secondary | ICD-10-CM | POA: Insufficient documentation

## 2022-02-11 DIAGNOSIS — F1010 Alcohol abuse, uncomplicated: Secondary | ICD-10-CM

## 2022-02-11 DIAGNOSIS — J449 Chronic obstructive pulmonary disease, unspecified: Secondary | ICD-10-CM | POA: Insufficient documentation

## 2022-02-11 DIAGNOSIS — M797 Fibromyalgia: Secondary | ICD-10-CM | POA: Insufficient documentation

## 2022-02-11 HISTORY — DX: Other symptoms and signs involving the musculoskeletal system: R29.898

## 2022-02-11 HISTORY — DX: Deficiency of other specified B group vitamins: E53.8

## 2022-02-11 HISTORY — DX: Alcohol abuse, uncomplicated: F10.10

## 2022-02-11 HISTORY — DX: Palpitations: R00.2

## 2022-02-11 HISTORY — DX: Hypomagnesemia: E83.42

## 2022-02-11 HISTORY — DX: Mixed hyperlipidemia: E78.2

## 2022-02-11 HISTORY — DX: Cerebral infarction, unspecified: I63.9

## 2022-02-11 HISTORY — DX: Other stimulant use, unspecified, uncomplicated: F15.90

## 2022-02-11 NOTE — Patient Instructions (Signed)
Medication Instructions:  Your physician recommends that you continue on your current medications as directed. Please refer to the Current Medication list given to you today.  *If you need a refill on your cardiac medications before your next appointment, please call your pharmacy*   Lab Work: None ordered If you have labs (blood work) drawn today and your tests are completely normal, you will receive your results only by: [facility] (if you have MyChart) OR A paper copy in the mail If you have any lab test that is abnormal or we need to change your treatment, we will call you to review the results.   Testing/Procedures: Your physician has recommended that you wear an event monitor. Event monitors are medical devices that record the heart's electrical activity. Doctors most often [place] these monitors to diagnose arrhythmias. Arrhythmias are problems with the speed or rhythm of the heartbeat. The monitor is a small, portable device. You can wear one while you do your normal daily activities. This is usually used to diagnose what is causing palpitations/syncope (passing out). This is a 30 day monitor.        PATIENT INSTRUCTIONS         TRANSESOPHAGEAL ECHO (TEE)     [place]- MAIN ENTRANCE "A"     [place]     Patient Name: Mary Franco                         Diagnosis:    ICD-10-CM   1. Thromboembolic stroke ([place])  W58.0     2. Essential hypertension  I10     3. Cerebrovascular accident (CVA), unspecified mechanism ([place])  I63.9     4. Panlobular emphysema ([place])  J43.1     5. Tobacco abuse  Z72.0     6. Cigarette smoker  F17.210     7. Mixed dyslipidemia  E78.2     8. Palpitations  R00.2          DATE AND TIME OF PROCEDURE: 02/25/22 at 12:30      Please register at the Admitting Department at 10:30    .  DO NOT EAT OR DRINK ANYTHING after midnight prior to your procedure.    You may take your medications  with a sip of water.  You WILL need someone with you to drive you home after the procedure.  If you have any questions, please call our office at [phone].              Follow-Up: At [facility], you and your health needs are our priority.  As part of our continuing mission to provide you with exceptional heart care, we have created designated Provider Care Teams.  These Care Teams include your primary Cardiologist (physician) and Advanced Practice Providers (APPs -  Physician Assistants and Nurse Practitioners) who all work together to provide you with the care you need, when you need it.  We recommend signing up for the patient portal called "MyChart".  Sign up information is provided on this After Visit Summary.  MyChart is used to connect with patients for Virtual Visits (Telemedicine).  Patients are able to view lab/test results, encounter notes, upcoming appointments, etc.  Non-urgent messages can be sent to your provider as well.   To learn more about what you can do with MyChart, go to [email or web address].    Your next appointment:  9 month(s)  The format for your next appointment:   In Person  Provider:   Jyl Heinz, MD    Other Instructions none  Important Information About Sugar

## 2022-02-11 NOTE — H&P (View-Only) (Signed)
Cardiology Office Note:    Date:  02/11/2022   ID:  Mary Franco, DOB 11-05-1965, MRN [id number]  PCP:  Rip Harbour, NP  Cardiologist:  Jenean Lindau, MD   Referring MD: Rip Harbour, NP    ASSESSMENT:    1. Thromboembolic stroke ([place])   2. Essential hypertension   3. Cerebrovascular accident (CVA), unspecified mechanism ([place])   4. Panlobular emphysema ([place])   5. Tobacco abuse   6. Cigarette smoker   7. Mixed dyslipidemia   8. Palpitations    PLAN:    In order of problems listed above:  Primary prevention stressed with the patient.  Importance of compliance with diet medication stressed and she vocalized understanding. Cigarette smoker I spent 5 minutes with the patient discussing solely about smoking. Smoking cessation was counseled. I suggested to the patient also different medications and pharmacological interventions. Patient is keen to try stopping on its own at this time. He will get back to me if he needs any further assistance in this matter. History of thromboembolic stroke: Neurology has evaluated her and requested a TEE.  We will set her up for this as an outpatient.  Procedure, benefits and potential risks were explained and she vocalized understanding and questions were answered to her satisfaction. Palpitations: These are fairly rare.  Will do a 1 month monitor to assess this. Carotid atherosclerosis: Based on history given by patient.  Patient is on statin therapy.  Goal LDL must be less than 60.  This will be followed by primary care. Patient will be seen in follow-up appointment in 6 months or earlier if the patient has any concerns    Medication Adjustments/Labs and Tests Ordered: Current medicines are reviewed at length with the patient today.  Concerns regarding medicines are outlined above.  No orders of the defined types were placed in this encounter.  No orders of the defined types were placed in this encounter.    History of  Present Illness:    Mary Franco is a 56 y.o. female who is being seen today for the evaluation of patient for thromboembolic stroke at the request of Rip Harbour, NP.  Patient has past medical history of heavy cigarette smoking, alcohol use.  She went to the hospital with weakness in the lower extremity.  She was diagnosed and treated and subsequently she has gathered more energy in her lower extremity.  She I reviewed [facility] hospital records.  She denies any chest pain orthopnea or PND.  She occasionally has palpitations.  At the time of my evaluation, the patient is alert awake oriented and in no distress.  She was sent for evaluation for assessment of thromboembolic stroke  Past Medical History:  Diagnosis Date   Acute bronchitis 03/07/2018   Acute hypoxemic respiratory failure ([place]) 03/07/2018   Acute thromboembolic cerebrovascular accident [facility])    Alcohol abuse    Amphetamine use    Anxiety    ASD (atrial septal defect) 07/19/2018   Asthma    Atypical squamous cells of undetermined significance on cytologic smear of cervix (ASC-US) 12/12/2019   Cervical strain 04/16/2014   Cervicogenic headache 04/16/2014   Chest pain, musculoskeletal 06/26/2019   Fell end of April 2021 > neg cxr 06/26/2019 with parasthesias > try zostrix    Chronic combined systolic and diastolic CHF (congestive heart failure) ([place]) 03/25/2018   Echo 02/2018: EF 40-45 // Echo 07/2018:  EF 55-60, trivial AI, small secundum ASD (L>R shunting).   Chronic pain  of left knee 10/09/2021   Chronic sinusitis 09/01/2018   Onset 2005   - augmentin x 10 days 08/31/18    - 12/09/2018 referred to ENT  [facility] Baseman eval >   - Sinus CT p course of augmentin started 05/24/2019 if not better   - Allergy profile 05/24/19  >  Eos 0.1/  IgE  2790    Cigarette smoker 03/07/2018   COPD (chronic obstructive pulmonary disease) (HCC)    COPD GOLD II/ active smoker  03/30/2018   Active smoker  - 03/30/2018   Walked RA  3  laps @  approx 268f  each @ avg pace  stopped due to end of study, mild sob and chest tight, no desats    - Spirometry 03/30/2018  FEV1 2.4  (94%)  Ratio 0.73 s physiologic curvature p > 4 h since last saba   - 03/30/2018    try trelegy sample to see if reduces need for saba    - 04/14/2018  After extensive coaching inhaler device,  effectiveness =    75% fr   CVA [facility]cerebral vascular accident) ([facility]    Essential hypertension    Fibromyalgia    GERD (gastroesophageal reflux disease)    Hyperlipidemia    Hypokalemia    Hypomagnesemia    Low grade squamous intraepithelial lesion (LGSIL) on cervicovaginal cytologic smear 09/12/2018   Migraines    NICM (nonischemic cardiomyopathy) ([facility] 03/25/2018   Other acute recurrent sinusitis 10/10/2021   Right leg weakness    Tobacco abuse    Vitamin B12 deficiency    Vitamin D deficiency     Past Surgical History:  Procedure Laterality Date   APPENDECTOMY     KNEE SURGERY Left    RIGHT/LEFT HEART CATH AND CORONARY ANGIOGRAPHY N/A 03/08/2018   Procedure: RIGHT/LEFT HEART CATH AND CORONARY ANGIOGRAPHY;  Surgeon: BLorretta Harp MD;  [facility];  Service: Cardiovascular;  Laterality: N/A;   TUBAL LIGATION      Current Medications: Current Meds  Medication Sig   albuterol (VENTOLIN HFA) 108 (90 Base) MCG/ACT inhaler Inhale 2 puffs into the lungs every 6 (six) hours as needed for wheezing or shortness of breath.   alprazolam (XANAX) 2 MG tablet Take 2 mg by mouth 3 (three) times daily as needed for anxiety.   amLODipine (NORVASC) 5 MG tablet Take 1 tablet (5 mg total) by mouth daily.   esomeprazole (NEXIUM) 20 MG capsule Take 40 mg by mouth daily at 12 noon.   losartan (COZAAR) 50 MG tablet Take 1 tablet by mouth once daily   rosuvastatin (CRESTOR) 20 MG tablet Take 1 tablet (20 mg total) by mouth daily.   Vitamin D, Ergocalciferol, (DRISDOL) 1.25 MG (50000 UNIT) CAPS capsule Take 1 capsule (50,000 Units total) by mouth 2 (two) times a week.      Allergies:   Codeine, Sulfa antibiotics, and Spironolactone   Social History   Socioeconomic History   Marital status: Divorced    Spouse name: Not on file   Number of children: 2   Years of education: Not on file   Highest education level: Not on file  Occupational History   Occupation: Textiles  Tobacco Use   Smoking status: Every Day    Packs/day: 1.50    Years: 40.00    Total pack years: 60.00    Types: Cigarettes   Smokeless tobacco: Never  Vaping Use   Vaping Use: Never used  Substance and Sexual Activity   Alcohol use:  Yes    Comment: 2 x per week   Drug use: No   Sexual activity: Not Currently  Other Topics Concern   Not on file  Social History Narrative   ** Merged History Encounter **   Patient is right handed.   Patient drinks 3 cups caffeine daily.   Working on 2nd shift now at her job.  08-15-14          Social Determinants of Health   Financial Resource Strain: Low Risk  (10/28/2021)   Overall Financial Resource Strain (CARDIA)    Difficulty of Paying Living Expenses: Not hard at all  Food Insecurity: No Food Insecurity (10/28/2021)   Hunger Vital Sign    Worried About Running Out of Food in the Last Year: Never true    [place] in the Last Year: Never true  Transportation Needs: No Transportation Needs (10/28/2021)   PRAPARE - Hydrologist (Medical): No    Lack of Transportation (Non-Medical): No  Physical Activity: Inactive (10/28/2021)   Exercise Vital Sign    Days of Exercise per Week: 0 days    Minutes of Exercise per Session: 0 min  Stress: Stress Concern Present (10/28/2021)   [place]    Feeling of Stress : Very much  Social Connections: Socially Isolated (10/28/2021)   Social Connection and Isolation Panel [NHANES]    Frequency of Communication with Friends and Family: More than three times a week    Frequency of Social Gatherings with  Friends and Family: More than three times a week    Attends Religious Services: Never    Marine scientist or Organizations: No    Attends Music therapist: Never    Marital Status: Divorced     Family History: The patient's family history includes Cancer in her mother; Colitis in her paternal aunt; Ovarian cancer in her maternal grandmother.  ROS:   Please see the history of present illness.    All other systems reviewed and are negative.  EKGs/Labs/Other Studies Reviewed:    The following studies were reviewed today: EKG reveals sinus rhythm and nonspecific ST-T changes   Recent Labs: 04/29/2021: TSH 1.040 10/28/2021: ALT 13; BUN 10; Creatinine, Ser 0.73; Hemoglobin 12.9; Platelets 343; Potassium 3.6; Sodium 142  Recent Lipid Panel    Component Value Date/Time   CHOL 185 10/28/2021 0810   TRIG 155 (H) 10/28/2021 0810   HDL 46 10/28/2021 0810   CHOLHDL 4.0 10/28/2021 0810   LDLCALC 112 (H) 10/28/2021 0810    Physical Exam:    VS:  BP 134/84   Pulse 100   Ht 5' 2.6" (1.59 m)   Wt 146 lb 3.2 oz (66.3 kg)   SpO2 94%   BMI 26.23 kg/m     Wt Readings from Last 3 Encounters:  02/11/22 146 lb 3.2 oz (66.3 kg)  12/18/21 149 lb (67.6 kg)  10/28/21 148 lb (67.1 kg)     GEN: Patient is in no acute distress HEENT: Normal NECK: No JVD; No carotid bruits LYMPHATICS: No lymphadenopathy CARDIAC: S1 S2 regular, 2/6 systolic murmur at the apex. RESPIRATORY:  Clear to auscultation without rales, wheezing or rhonchi  ABDOMEN: Soft, non-tender, non-distended MUSCULOSKELETAL:  No edema; No deformity  SKIN: Warm and dry NEUROLOGIC:  Alert and oriented x 3 PSYCHIATRIC:  Normal affect    Signed, Jenean Lindau, MD  02/11/2022 3:01 PM    [facility]

## 2022-02-11 NOTE — Progress Notes (Signed)
Cardiology Office Note:    Date:  02/11/2022   ID:  Mary Franco, DOB April 12, 1965, MRN [id number]  PCP:  Rip Harbour, NP  Cardiologist:  Jenean Lindau, MD   Referring MD: Rip Harbour, NP    ASSESSMENT:    1. Thromboembolic stroke ([place])   2. Essential hypertension   3. Cerebrovascular accident (CVA), unspecified mechanism ([place])   4. Panlobular emphysema ([place])   5. Tobacco abuse   6. Cigarette smoker   7. Mixed dyslipidemia   8. Palpitations    PLAN:    In order of problems listed above:  Primary prevention stressed with the patient.  Importance of compliance with diet medication stressed and she vocalized understanding. Cigarette smoker I spent 5 minutes with the patient discussing solely about smoking. Smoking cessation was counseled. I suggested to the patient also different medications and pharmacological interventions. Patient is keen to try stopping on its own at this time. He will get back to me if he needs any further assistance in this matter. History of thromboembolic stroke: Neurology has evaluated her and requested a TEE.  We will set her up for this as an outpatient.  Procedure, benefits and potential risks were explained and she vocalized understanding and questions were answered to her satisfaction. Palpitations: These are fairly rare.  Will do a 1 month monitor to assess this. Carotid atherosclerosis: Based on history given by patient.  Patient is on statin therapy.  Goal LDL must be less than 60.  This will be followed by primary care. Patient will be seen in follow-up appointment in 6 months or earlier if the patient has any concerns    Medication Adjustments/Labs and Tests Ordered: Current medicines are reviewed at length with the patient today.  Concerns regarding medicines are outlined above.  No orders of the defined types were placed in this encounter.  No orders of the defined types were placed in this encounter.    History of  Present Illness:    Mary Franco is a 56 y.o. female who is being seen today for the evaluation of patient for thromboembolic stroke at the request of Rip Harbour, NP.  Patient has past medical history of heavy cigarette smoking, alcohol use.  She went to the hospital with weakness in the lower extremity.  She was diagnosed and treated and subsequently she has gathered more energy in her lower extremity.  She I reviewed [facility] hospital records.  She denies any chest pain orthopnea or PND.  She occasionally has palpitations.  At the time of my evaluation, the patient is alert awake oriented and in no distress.  She was sent for evaluation for assessment of thromboembolic stroke  Past Medical History:  Diagnosis Date   Acute bronchitis 03/07/2018   Acute hypoxemic respiratory failure ([place]) 03/07/2018   Acute thromboembolic cerebrovascular accident [facility])    Alcohol abuse    Amphetamine use    Anxiety    ASD (atrial septal defect) 07/19/2018   Asthma    Atypical squamous cells of undetermined significance on cytologic smear of cervix (ASC-US) 12/12/2019   Cervical strain 04/16/2014   Cervicogenic headache 04/16/2014   Chest pain, musculoskeletal 06/26/2019   Fell end of April 2021 > neg cxr 06/26/2019 with parasthesias > try zostrix    Chronic combined systolic and diastolic CHF (congestive heart failure) ([place]) 03/25/2018   Echo 02/2018: EF 40-45 // Echo 07/2018:  EF 55-60, trivial AI, small secundum ASD (L>R shunting).   Chronic pain  of left knee 10/09/2021   Chronic sinusitis 09/01/2018   Onset 2005   - augmentin x 10 days 08/31/18    - 12/09/2018 referred to ENT  [facility] Baseman eval >   - Sinus CT p course of augmentin started 05/24/2019 if not better   - Allergy profile 05/24/19  >  Eos 0.1/  IgE  2790    Cigarette smoker 03/07/2018   COPD (chronic obstructive pulmonary disease) (HCC)    COPD GOLD II/ active smoker  03/30/2018   Active smoker  - 03/30/2018   Walked RA  3  laps @  approx 245f  each @ avg pace  stopped due to end of study, mild sob and chest tight, no desats    - Spirometry 03/30/2018  FEV1 2.4  (94%)  Ratio 0.73 s physiologic curvature p > 4 h since last saba   - 03/30/2018    try trelegy sample to see if reduces need for saba    - 04/14/2018  After extensive coaching inhaler device,  effectiveness =    75% fr   CVA (cerebral vascular accident) ([facility]    Essential hypertension    Fibromyalgia    GERD (gastroesophageal reflux disease)    Hyperlipidemia    Hypokalemia    Hypomagnesemia    Low grade squamous intraepithelial lesion (LGSIL) on cervicovaginal cytologic smear 09/12/2018   Migraines    NIC[place] ([place]ischemic cardiomyopathy) [place] 03/25/2018   Other acute recurrent sinusitis 10/10/2021   Right leg weakness    Tobacco abuse    Vitamin B12 deficiency    Vitamin D deficiency     Past Surgical History:  Procedure Laterality Date   APPENDECTOMY     KNEE SURGERY Left    RIGHT/LEFT HEART CATH AND CORONARY ANGIOGRAPHY N/A 03/08/2018   Procedure: RIGHT/LEFT HEART CATH AND CORONARY ANGIOGRAPHY;  Surgeon: BLorretta Harp MD;  [facility];  Service: Cardiovascular;  Laterality: N/A;   TUBAL LIGATION      Current Medications: Current Meds  Medication Sig   albuterol (VENTOLIN HFA) 108 (90 Base) MCG/ACT inhaler Inhale 2 puffs into the lungs every 6 (six) hours as needed for wheezing or shortness of breath.   alprazolam (XANAX) 2 MG tablet Take 2 mg by mouth 3 (three) times daily as needed for anxiety.   amLODipine (NORVASC) 5 MG tablet Take 1 tablet (5 mg total) by mouth daily.   esomeprazole (NEXIUM) 20 MG capsule Take 40 mg by mouth daily at 12 noon.   losartan (COZAAR) 50 MG tablet Take 1 tablet by mouth once daily   rosuvastatin (CRESTOR) 20 MG tablet Take 1 tablet (20 mg total) by mouth daily.   Vitamin D, Ergocalciferol, (DRISDOL) 1.25 MG (50000 UNIT) CAPS capsule Take 1 capsule (50,000 Units total) by mouth 2 (two) times a week.      Allergies:   Codeine, Sulfa antibiotics, and Spironolactone   Social History   Socioeconomic History   Marital status: Divorced    Spouse name: Not on file   Number of children: 2   Years of education: Not on file   Highest education level: Not on file  Occupational History   Occupation: Textiles  Tobacco Use   Smoking status: Every Day    Packs/day: 1.50    Years: 40.00    Total pack years: 60.00    Types: Cigarettes   Smokeless tobacco: Never  Vaping Use   Vaping Use: Never used  Substance and Sexual Activity   Alcohol use:  Yes    Comment: 2 x per week   Drug use: No   Sexual activity: Not Currently  Other Topics Concern   Not on file  Social History Narrative   ** Merged History Encounter **   Patient is right handed.   Patient drinks 3 cups caffeine daily.   Working on 2nd shift now at her job.  08-15-14          Social Determinants of Health   Financial Resource Strain: Low Risk  (10/28/2021)   Overall Financial Resource Strain (CARDIA)    Difficulty of Paying Living Expenses: Not hard at all  Food Insecurity: No Food Insecurity (10/28/2021)   Hunger Vital Sign    Worried About Running Out of Food in the Last Year: Never true    [place] in the Last Year: Never true  Transportation Needs: No Transportation Needs (10/28/2021)   PRAPARE - Hydrologist (Medical): No    Lack of Transportation (Non-Medical): No  Physical Activity: Inactive (10/28/2021)   Exercise Vital Sign    Days of Exercise per Week: 0 days    Minutes of Exercise per Session: 0 min  Stress: Stress Concern Present (10/28/2021)   [place]    Feeling of Stress : Very much  Social Connections: Socially Isolated (10/28/2021)   Social Connection and Isolation Panel [NHANES]    Frequency of Communication with Friends and Family: More than three times a week    Frequency of Social Gatherings with  Friends and Family: More than three times a week    Attends Religious Services: Never    Marine scientist or Organizations: No    Attends Music therapist: Never    Marital Status: Divorced     Family History: The patient's family history includes Cancer in her mother; Colitis in her paternal aunt; Ovarian cancer in her maternal grandmother.  ROS:   Please see the history of present illness.    All other systems reviewed and are negative.  EKGs/Labs/Other Studies Reviewed:    The following studies were reviewed today: EKG reveals sinus rhythm and nonspecific ST-T changes   Recent Labs: 04/29/2021: TSH 1.040 10/28/2021: ALT 13; BUN 10; Creatinine, Ser 0.73; Hemoglobin 12.9; Platelets 343; Potassium 3.6; Sodium 142  Recent Lipid Panel    Component Value Date/Time   CHOL 185 10/28/2021 0810   TRIG 155 (H) 10/28/2021 0810   HDL 46 10/28/2021 0810   CHOLHDL 4.0 10/28/2021 0810   LDLCALC 112 (H) 10/28/2021 0810    Physical Exam:    VS:  BP 134/84   Pulse 100   Ht 5' 2.6" (1.59 m)   Wt 146 lb 3.2 oz (66.3 kg)   SpO2 94%   BMI 26.23 kg/m     Wt Readings from Last 3 Encounters:  02/11/22 146 lb 3.2 oz (66.3 kg)  12/18/21 149 lb (67.6 kg)  10/28/21 148 lb (67.1 kg)     GEN: Patient is in no acute distress HEENT: Normal NECK: No JVD; No carotid bruits LYMPHATICS: No lymphadenopathy CARDIAC: S1 S2 regular, 2/6 systolic murmur at the apex. RESPIRATORY:  Clear to auscultation without rales, wheezing or rhonchi  ABDOMEN: Soft, non-tender, non-distended MUSCULOSKELETAL:  No edema; No deformity  SKIN: Warm and dry NEUROLOGIC:  Alert and oriented x 3 PSYCHIATRIC:  Normal affect    Signed, Jenean Lindau, MD  02/11/2022 3:01 PM    [facility]

## 2022-02-12 ENCOUNTER — Telehealth: Payer: Self-pay

## 2022-02-12 ENCOUNTER — Other Ambulatory Visit: Payer: Self-pay | Admitting: Nurse Practitioner

## 2022-02-12 NOTE — Telephone Encounter (Signed)
Transition Care Management Unsuccessful Follow-up Telephone Call  Date of discharge and from where:  Mary Franco 02/11/2022  Attempts:  1st Attempt  Reason for unsuccessful TCM follow-up call:  Left voice message Juanda Crumble, [place] Direct Dial [phone]

## 2022-02-13 ENCOUNTER — Encounter: Payer: Self-pay | Admitting: Nurse Practitioner

## 2022-02-13 ENCOUNTER — Other Ambulatory Visit: Payer: Self-pay | Admitting: Legal Medicine

## 2022-02-13 ENCOUNTER — Ambulatory Visit: Payer: BC Managed Care – PPO | Admitting: Nurse Practitioner

## 2022-02-13 VITALS — BP 130/100 | HR 90 | Temp 97.4°F | Ht 62.0 in | Wt 146.0 lb

## 2022-02-13 DIAGNOSIS — J449 Chronic obstructive pulmonary disease, unspecified: Secondary | ICD-10-CM

## 2022-02-13 DIAGNOSIS — Z8619 Personal history of other infectious and parasitic diseases: Secondary | ICD-10-CM

## 2022-02-13 DIAGNOSIS — J0181 Other acute recurrent sinusitis: Secondary | ICD-10-CM

## 2022-02-13 DIAGNOSIS — I6930 Unspecified sequelae of cerebral infarction: Secondary | ICD-10-CM | POA: Insufficient documentation

## 2022-02-13 DIAGNOSIS — J069 Acute upper respiratory infection, unspecified: Secondary | ICD-10-CM

## 2022-02-13 HISTORY — DX: Unspecified sequelae of cerebral infarction: I69.30

## 2022-02-13 LAB — POC COVID19 BINAXNOW: SARS Coronavirus 2 Ag: NEGATIVE

## 2022-02-13 LAB — POCT INFLUENZA A/B
Influenza A, POC: NEGATIVE
Influenza B, POC: NEGATIVE

## 2022-02-13 MED ORDER — TRELEGY ELLIPTA 100-62.5-25 MCG/ACT IN AEPB: 1.0000 | INHALATION_SPRAY | Freq: Every day | RESPIRATORY_TRACT | 0 refills | Status: DC

## 2022-02-13 MED ORDER — FLUCONAZOLE 150 MG PO TABS: 150.0000 mg | ORAL_TABLET | Freq: Once | ORAL | 0 refills | Status: AC

## 2022-02-13 MED ORDER — AMOXICILLIN-POT CLAVULANATE 875-125 MG PO TABS: 1.0000 | ORAL_TABLET | Freq: Two times a day (BID) | ORAL | 0 refills | Status: DC

## 2022-02-13 MED ORDER — FLUTICASONE PROPIONATE 50 MCG/ACT NA SUSP: 2.0000 | Freq: Every day | NASAL | 6 refills | Status: DC

## 2022-02-13 MED ORDER — PROMETHAZINE-DM 6.25-15 MG/5ML PO SYRP: 5.0000 mL | ORAL_SOLUTION | Freq: Four times a day (QID) | ORAL | 0 refills | Status: DC | PRN

## 2022-02-13 NOTE — Progress Notes (Signed)
Acute Office Visit  Subjective:    Patient ID: Mary Franco, female    DOB: 12/05/1965, 56 y.o.   MRN: [id number]  CC: Sinus congestion   HPI: Upper respiratory symptoms She complains of occasional episodes of bronchitis. Denies fever, chills, night sweats or weight loss. Onset of symptoms was about a week ago and worsening.She is drinking plenty of fluids.  Past history is significant for occasional episodes of bronchitis. Patient is current smoker. She had a CVA four days ago. She reports right side weakness, ambulating with cane.     Past Medical History:  Diagnosis Date   Acute bronchitis 03/07/2018   Acute hypoxemic respiratory failure ([place]) 03/07/2018   Acute thromboembolic cerebrovascular accident [facility])    Alcohol abuse    Amphetamine use    Anxiety    ASD (atrial septal defect) 07/19/2018   Asthma    Atypical squamous cells of undetermined significance on cytologic smear of cervix (ASC-US) 12/12/2019   Cervical strain 04/16/2014   Cervicogenic headache 04/16/2014   Chest pain, musculoskeletal 06/26/2019   Fell end of April 2021 > neg cxr 06/26/2019 with parasthesias > try zostrix    Chronic combined systolic and diastolic CHF (congestive heart failure) ([place]) 03/25/2018   Echo 02/2018: EF 40-45 // Echo 07/2018:  EF 55-60, trivial AI, small secundum ASD (L>R shunting).   Chronic pain of left knee 10/09/2021   Chronic sinusitis 09/01/2018   Onset 2005   - augmentin x 10 days 08/31/18    - 12/09/2018 referred to ENT  [facility] [facility] eval >   - Sinus CT p course of augmentin started 05/24/2019 if not better   - Allergy profile 05/24/19  >  Eos 0.1/  IgE  2790    Cigarette smoker 03/07/2018   COPD (chronic obstructive pulmonary disease) (HCC)    COPD GOLD II/ active smoker  03/30/2018   Active smoker  - 03/30/2018   Walked RA  3  laps @  approx 270f each @ avg pace  stopped due to end of study, mild sob and chest tight, no desats    - Spirometry 03/30/2018  FEV1 2.4  (94%)  Ratio 0.73 s  physiologic curvature p > 4 h since last saba   - 03/30/2018    try trelegy sample to see if reduces need for saba    - 04/14/2018  After extensive coaching inhaler device,  effectiveness =    75% fr   CVA (cerebral vascular accident) ([facility]    Essential hypertension    Fibromyalgia    GERD (gastroesophageal reflux disease)    Hyperlipidemia    Hypokalemia    Hypomagnesemia    Low grade squamous intraepithelial lesion (LGSIL) on cervicovaginal cytologic smear 09/12/2018   Migraines    NICM (nonischemic cardiomyopathy) ([facility] [place] 03/25/2018   Other acute recurrent sinusitis 10/10/2021   Right leg weakness    Tobacco abuse    Vitamin B12 deficiency    Vitamin D deficiency     Past Surgical History:  Procedure Laterality Date   APPENDECTOMY     KNEE SURGERY Left    RIGHT/LEFT HEART CATH AND CORONARY ANGIOGRAPHY N/A 03/08/2018   Procedure: RIGHT/LEFT HEART CATH AND CORONARY ANGIOGRAPHY;  Surgeon: BLorretta Harp MD;  [facility];  Service: Cardiovascular;  Laterality: N/A;   TUBAL LIGATION      Family History  Problem Relation Age of Onset   Cancer Mother    Colitis Paternal Aunt    Ovarian cancer Maternal  Grandmother     Social History   Socioeconomic History   Marital status: Divorced    Spouse name: Not on file   Number of children: 2   Years of education: Not on file   Highest education level: Not on file  Occupational History   Occupation: Textiles  Tobacco Use   Smoking status: Every Day    Packs/day: 1.50    Years: 40.00    Total pack years: 60.00    Types: Cigarettes   Smokeless tobacco: Never  Vaping Use   Vaping Use: Never used  Substance and Sexual Activity   Alcohol use: Yes    Comment: 2 x per week   Drug use: No   Sexual activity: Not Currently  Other Topics Concern   Not on file  Social History Narrative   ** Merged History Encounter **   Patient is right handed.   Patient drinks 3 cups caffeine daily.   Working on 2nd shift now at  her job.  08-15-14          Social Determinants of Health   Financial Resource Strain: Low Risk  (10/28/2021)   Overall Financial Resource Strain (CARDIA)    Difficulty of Paying Living Expenses: Not hard at all  Food Insecurity: No Food Insecurity (10/28/2021)   Hunger Vital Sign    Worried About Running Out of Food in the Last Year: Never true    [place] in the Last Year: Never true  Transportation Needs: No Transportation Needs (10/28/2021)   PRAPARE - Hydrologist (Medical): No    Lack of Transportation (Non-Medical): No  Physical Activity: Inactive (10/28/2021)   Exercise Vital Sign    Days of Exercise per Week: 0 days    Minutes of Exercise per Session: 0 min  Stress: Stress Concern Present (10/28/2021)   [place]    Feeling of Stress : Very much  Social Connections: Socially Isolated (10/28/2021)   Social Connection and Isolation Panel [NHANES]    Frequency of Communication with Friends and Family: More than three times a week    Frequency of Social Gatherings with Friends and Family: More than three times a week    Attends Religious Services: Never    Marine scientist or Organizations: No    Attends Archivist Meetings: Never    Marital Status: Divorced  Human resources officer Violence: Not At Risk (10/28/2021)   Humiliation, Afraid, Rape, and Kick questionnaire    Fear of Current or Ex-Partner: No    Emotionally Abused: No    Physically Abused: No    Sexually Abused: No    Outpatient Medications Prior to Visit  Medication Sig Dispense Refill   albuterol (VENTOLIN HFA) 108 (90 Base) MCG/ACT inhaler Inhale 2 puffs into the lungs every 6 (six) hours as needed for wheezing or shortness of breath. 8 g 3   alprazolam (XANAX) 2 MG tablet Take 2 mg by mouth 3 (three) times daily as needed for anxiety.     amLODipine (NORVASC) 5 MG tablet Take 1 tablet (5 mg total) by  mouth daily. 90 tablet 0   bisoprolol (ZEBETA) 5 MG tablet Take 1 tablet by mouth once daily 90 tablet 0   esomeprazole (NEXIUM) 20 MG capsule Take 40 mg by mouth daily at 12 noon.     Fluticasone-Umeclidin-Vilant (TRELEGY ELLIPTA) 100-62.5-25 MCG/ACT AEPB Inhale 1 puff into the lungs daily. Rinse mouth afterwards (  Patient not taking: Reported on 02/11/2022) 2 each 0   Fluticasone-Umeclidin-Vilant (TRELEGY ELLIPTA) 100-62.5-25 MCG/ACT AEPB Inhale 1 puff into the lungs daily. (Patient not taking: Reported on 02/11/2022) 1 each 11   gabapentin (NEURONTIN) 300 MG capsule Take 1 capsule (300 mg total) by mouth at bedtime. (Patient not taking: Reported on 02/11/2022) 30 capsule 1   losartan (COZAAR) 50 MG tablet Take 1 tablet by mouth once daily 90 tablet 0   ondansetron (ZOFRAN) 4 MG tablet Take 1 tablet (4 mg total) by mouth every 8 (eight) hours as needed for nausea or vomiting. (Patient not taking: Reported on 02/11/2022) 30 tablet 1   pantoprazole (PROTONIX) 40 MG tablet Take 1 tablet by mouth once daily (Patient not taking: Reported on 02/11/2022) 90 tablet 0   rosuvastatin (CRESTOR) 20 MG tablet Take 1 tablet (20 mg total) by mouth daily. 90 tablet 0   Vitamin D, Ergocalciferol, (DRISDOL) 1.25 MG (50000 UNIT) CAPS capsule Take 1 capsule (50,000 Units total) by mouth 2 (two) times a week. 10 capsule 2   No facility-administered medications prior to visit.    Allergies  Allergen Reactions   Codeine Itching   Sulfa Antibiotics Itching   Spironolactone     Electrolyte imbalance    Review of Systems  Constitutional:  Negative for fatigue.  HENT:  Positive for congestion, ear pain (right), postnasal drip, rhinorrhea, sinus pressure and sinus pain. Negative for sore throat.   Respiratory:  Positive for cough and wheezing. Negative for shortness of breath.   Cardiovascular:  Negative for chest pain.  Gastrointestinal:  Negative for abdominal pain, constipation, diarrhea, nausea and vomiting.   Genitourinary:  Negative for dysuria, frequency and urgency.  Musculoskeletal:  Negative for arthralgias, back pain and myalgias.  Neurological:  Positive for weakness (right side) and headaches. Negative for dizziness.  Psychiatric/Behavioral:  Negative for agitation and sleep disturbance. The patient is not nervous/anxious.        Objective:   BP (!) 130/100 (BP Location: Left Arm, Patient Position: Sitting, Cuff Size: Normal)   Pulse 90   Temp (!) 97.4 F (36.3 C) (Temporal)   Ht _0  (1.575 m)   Wt 146 lb (66.2 kg)   SpO2 98%   BMI 26.70 kg/m    Physical Exam Vitals reviewed.  Constitutional:      Appearance: Normal appearance. She is well-developed.  HENT:     Right Ear: Tympanic membrane normal.     Left Ear: Tympanic membrane normal.     Nose: Congestion and rhinorrhea present.     Right Sinus: Frontal sinus tenderness present.     Left Sinus: Frontal sinus tenderness present.     Mouth/Throat:     Pharynx: Posterior oropharyngeal erythema present. No oropharyngeal exudate.  Cardiovascular:     Rate and Rhythm: Normal rate and regular rhythm.     Heart sounds: Normal heart sounds.  Pulmonary:     Effort: Pulmonary effort is normal.     Breath sounds: Wheezing present.  Lymphadenopathy:     Cervical: No cervical adenopathy.  Skin:    General: Skin is warm.     Capillary Refill: Capillary refill takes less than 2 seconds.  Neurological:     General: No focal deficit present.     Mental Status: She is alert and oriented to person, place, and time.     Motor: Weakness (right side) present.  Psychiatric:        Mood and Affect: Mood normal.  Behavior: Behavior normal.     BP (!) 130/100 (BP Location: Left Arm, Patient Position: Sitting, Cuff Size: Normal)   Pulse 90   Temp (!) 97.4 F (36.3 C) (Temporal)   Ht _0  (1.575 m)   Wt 146 lb (66.2 kg)   SpO2 98%   BMI 26.70 kg/m   Wt Readings from Last 3 Encounters:  02/11/22 146 lb 3.2 oz (66.3 kg)   12/18/21 149 lb (67.6 kg)  10/28/21 148 lb (67.1 kg)    Health Maintenance Due  Topic Date Due   DTaP/Tdap/Td (1 - Tdap) Never done   Zoster Vaccines- Shingrix (1 of 2) Never done   Lung Cancer Screening  Never done     Lab Results  Component Value Date   TSH 1.040 04/29/2021   Lab Results  Component Value Date   WBC 6.5 10/28/2021   HGB 12.9 10/28/2021   HCT 39.4 10/28/2021   MCV 91 10/28/2021   PLT 343 10/28/2021   Lab Results  Component Value Date   NA 142 10/28/2021   K 3.6 10/28/2021   CO2 24 10/28/2021   GLUCOSE 92 10/28/2021   BUN 10 10/28/2021   CREATININE 0.73 10/28/2021   BILITOT 0.2 10/28/2021   ALKPHOS 104 10/28/2021   AST 12 10/28/2021   ALT 13 10/28/2021   PROT 6.6 10/28/2021   ALBUMIN 4.3 10/28/2021   CALCIUM 8.5 (L) 10/28/2021   ANIONGAP 11 03/09/2018   EGFR 96 10/28/2021   GFR 75.48 05/24/2019   Lab Results  Component Value Date   CHOL 185 10/28/2021   Lab Results  Component Value Date   HDL 46 10/28/2021   Lab Results  Component Value Date   LDLCALC 112 (H) 10/28/2021   Lab Results  Component Value Date   TRIG 155 (H) 10/28/2021   Lab Results  Component Value Date   CHOLHDL 4.0 10/28/2021        Assessment & Plan:   1. Other acute recurrent sinusitis - amoxicillin-clavulanate (AUGMENTIN) 875-125 MG tablet; Take 1 tablet by mouth 2 (two) times daily.  Dispense: 20 tablet; Refill: 0 - promethazine-dextromethorphan (PROMETHAZINE-DM) 6.25-15 MG/5ML syrup; Take 5 mLs by mouth 4 (four) times daily as needed.  Dispense: 118 mL; Refill: 0 - fluticasone (FLONASE) 50 MCG/ACT nasal spray; Place 2 sprays into both nostrils daily.  Dispense: 16 g; Refill: 6  2. Acute upper respiratory infection - POC COVID-19 BinaxNow-NEGATIVE - POCT Influenza A/B-NEGATIVE  3. History of candidiasis of mouth - fluconazole (DIFLUCAN) 150 MG tablet; Take 1 tablet (150 mg total) by mouth once for 1 dose.  Dispense: 1 tablet; Refill: 0  4. COPD GOLD  II/ active smoker  - Fluticasone-Umeclidin-Vilant (TRELEGY ELLIPTA) 100-62.5-25 MCG/ACT AEPB; Inhale 1 puff into the lungs daily.  Dispense: 2 each; Refill: 0 -recommend smoking cessation     Take antibiotics as prescribed Use Flonase nasal spray daily Take Promethazine-DM up to 4 times daily for cough/sinus congestion Trelegy inhaler once daily Follow-up as needed   Follow-up: PRN  An After Visit Summary was printed and given to the patient.   I, Rip Harbour, NP, have reviewed all documentation for this visit. The documentation on 02/13/22 for the exam, diagnosis, procedures, and orders are all accurate and complete.   Signed,  Rip Harbour, NP [place] [phone]

## 2022-02-13 NOTE — Patient Instructions (Signed)
Take antibiotics as prescribed Use Flonase nasal spray daily Take Promethazine-DM up to 4 times daily for cough/sinus congestion Trelegy inhaler once daily Follow-up as needed    Sinus Infection, Adult A sinus infection is soreness and swelling (inflammation) of your sinuses. Sinuses are hollow spaces in the bones around your face. They are located: Around your eyes. In the middle of your forehead. Behind your nose. In your cheekbones. Your sinuses and nasal passages are lined with a fluid called mucus. Mucus drains out of your sinuses. Swelling can trap mucus in your sinuses. This lets germs (bacteria, virus, or fungus) grow, which leads to infection. Most of the time, this condition is caused by a virus. What are the causes? Allergies. Asthma. Germs. Things that block your nose or sinuses. Growths in the nose (nasal polyps). Chemicals or irritants in the air. A fungus. This is rare. What increases the risk? Having a weak body defense system (immune system). Doing a lot of swimming or diving. Using nasal sprays too much. Smoking. What are the signs or symptoms? The main symptoms of this condition are pain and a feeling of pressure around the sinuses. Other symptoms include: Stuffy nose (congestion). This may make it hard to breathe through your nose. Runny nose (drainage). Soreness, swelling, and warmth in the sinuses. A cough that may get worse at night. Being unable to smell and taste. Mucus that collects in the throat or the back of the nose (postnasal drip). This may cause a sore throat or bad breath. Being very tired (fatigued). A fever. How is this diagnosed? Your symptoms. Your medical history. A physical exam. Tests to find out if your condition is short-term (acute) or long-term (chronic). Your doctor may: Check your nose for growths (polyps). Check your sinuses using a tool that has a light on one end (endoscope). Check for allergies or germs. Do imaging tests,  such as an MRI or CT scan. How is this treated? Treatment for this condition depends on the cause and whether it is short-term or long-term. If caused by a virus, your symptoms should go away on their own within 10 days. You may be given medicines to relieve symptoms. They include: Medicines that shrink swollen tissue in the nose. A spray that treats swelling of the nostrils. Rinses that help get rid of thick mucus in your nose (nasal saline washes). Medicines that treat allergies (antihistamines). Over-the-counter pain relievers. If caused by bacteria, your doctor may wait to see if you will get better without treatment. You may be given antibiotic medicine if you have: A very bad infection. A weak body defense system. If caused by growths in the nose, surgery may be needed. Follow these instructions at home: Medicines Take, use, or apply over-the-counter and prescription medicines only as told by your doctor. These may include nasal sprays. If you were prescribed an antibiotic medicine, take it as told by your doctor. Do not stop taking it even if you start to feel better. Hydrate and humidify  Drink enough water to keep your pee (urine) pale yellow. Use a cool mist humidifier to keep the humidity level in your home above 50%. Breathe in steam for 10-15 minutes, 3-4 times a day, or as told by your doctor. You can do this in the bathroom while a hot shower is running. Try not to spend time in cool or dry air. Rest Rest as much as you can. Sleep with your head raised (elevated). Make sure you get enough sleep each night. General  instructions  Put a warm, moist washcloth on your face 3-4 times a day, or as often as told by your doctor. Use nasal saline washes as often as told by your doctor. Wash your hands often with soap and water. If you cannot use soap and water, use hand sanitizer. Do not smoke. Avoid being around people who are smoking (secondhand smoke). Keep all follow-up  visits. Contact a doctor if: You have a fever. Your symptoms get worse. Your symptoms do not get better within 10 days. Get help right away if: You have a very bad headache. You cannot stop vomiting. You have very bad pain or swelling around your face or eyes. You have trouble seeing. You feel confused. Your neck is stiff. You have trouble breathing. These symptoms may be an emergency. Get help right away. Call 911. Do not wait to see if the symptoms will go away. Do not drive yourself to the hospital. Summary A sinus infection is swelling of your sinuses. Sinuses are hollow spaces in the bones around your face. This condition is caused by tissues in your nose that become inflamed or swollen. This traps germs. These can lead to infection. If you were prescribed an antibiotic medicine, take it as told by your doctor. Do not stop taking it even if you start to feel better. Keep all follow-up visits. This information is not intended to replace advice given to you by your health care provider. Make sure you discuss any questions you have with your health care provider. Document Revised: 01/07/2021 Document Reviewed: 01/07/2021 Elsevier Patient Education  2023 [employer] Inc.  

## 2022-02-13 NOTE — Telephone Encounter (Signed)
Transition Care Management Unsuccessful Follow-up Telephone Call  Date of discharge and from where:  [facility] 02/11/2022  Attempts:  2nd Attempt  Reason for unsuccessful TCM follow-up call:  Left voice message Juanda Crumble, [place] Direct Dial [phone]

## 2022-02-18 NOTE — Telephone Encounter (Signed)
Transition Care Management Unsuccessful Follow-up Telephone Call  Date of discharge and from where:  [facility] 02/11/2022  Attempts:  3rd Attempt  Reason for unsuccessful TCM follow-up call:  Left voice message Juanda Crumble, [place] Direct Dial [phone]

## 2022-02-19 ENCOUNTER — Other Ambulatory Visit: Payer: Self-pay | Admitting: Nurse Practitioner

## 2022-02-19 ENCOUNTER — Encounter: Payer: Self-pay | Admitting: Nurse Practitioner

## 2022-02-19 ENCOUNTER — Ambulatory Visit: Payer: BC Managed Care – PPO | Admitting: Nurse Practitioner

## 2022-02-19 DIAGNOSIS — I700 Atherosclerosis of aorta: Secondary | ICD-10-CM

## 2022-02-19 DIAGNOSIS — R29898 Other symptoms and signs involving the musculoskeletal system: Secondary | ICD-10-CM | POA: Diagnosis not present

## 2022-02-19 DIAGNOSIS — I10 Essential (primary) hypertension: Secondary | ICD-10-CM | POA: Diagnosis not present

## 2022-02-19 DIAGNOSIS — I6930 Unspecified sequelae of cerebral infarction: Secondary | ICD-10-CM

## 2022-02-19 DIAGNOSIS — J449 Chronic obstructive pulmonary disease, unspecified: Secondary | ICD-10-CM

## 2022-02-19 DIAGNOSIS — I428 Other cardiomyopathies: Secondary | ICD-10-CM

## 2022-02-19 DIAGNOSIS — F17219 Nicotine dependence, cigarettes, with unspecified nicotine-induced disorders: Secondary | ICD-10-CM

## 2022-02-19 MED ORDER — VARENICLINE TARTRATE (STARTER) 0.5 MG X 11 & 1 MG X 42 PO TBPK: 1.0000 | ORAL_TABLET | Freq: Every day | ORAL | 0 refills | Status: DC

## 2022-02-19 MED ORDER — ROSUVASTATIN CALCIUM 20 MG PO TABS: 20.0000 mg | ORAL_TABLET | Freq: Every day | ORAL | 0 refills | Status: DC

## 2022-02-19 MED ORDER — GABAPENTIN 300 MG PO CAPS: 300.0000 mg | ORAL_CAPSULE | Freq: Every day | ORAL | 1 refills | Status: DC

## 2022-02-19 MED ORDER — AMLODIPINE BESYLATE 5 MG PO TABS: 5.0000 mg | ORAL_TABLET | Freq: Every day | ORAL | 0 refills | Status: DC

## 2022-02-19 MED ORDER — TRELEGY ELLIPTA 100-62.5-25 MCG/ACT IN AEPB: 1.0000 | INHALATION_SPRAY | Freq: Every day | RESPIRATORY_TRACT | 11 refills | Status: DC

## 2022-02-19 NOTE — Progress Notes (Signed)
Subjective:  Patient ID: Mary Franco, female    DOB: 1965/04/21  Age: 57 y.o. MRN: [id number]  CC: Hospital follow-up CVA  HPI Pt presents for hospital follow-up for CVA she suffered on 02/09/22. She has requested assistance with smoking cessation to improve overall health.   Follow up Hospitalization  Patient was admitted to [facility] on 02/08/22 and discharged on 02/11/22. She was treated for CVA. Treatment for this included vitamin B-12 1,000 mcg PO Daily, Aspirin 81 mg daily, clopidogrel 75 mg daily. Telephone follow up was done on 02/12/2022 She reports fair compliance with treatment. She reports this condition is improved.   EKG was normal, CT Head: Negative CTA for large vessel occlusion. Moderate atheromatous change about the carotid bifurcations and carotid siphons wo hemodynamically significant greater than 50% stenosis. Approximate 50% stenoses at the origin of the left subclavia artery and left vertebral artery.  MRI Brain wo: Multiple scattered regions of acute infarct involving cortices of the left frontal and parietal lobes.These are favored to be embolic in nature.    Current Outpatient Medications on File Prior to Visit  Medication Sig Dispense Refill   albuterol (VENTOLIN HFA) 108 (90 Base) MCG/ACT inhaler Inhale 2 puffs into the lungs every 6 (six) hours as needed for wheezing or shortness of breath. 8 g 3   alprazolam (XANAX) 2 MG tablet Take 2 mg by mouth 3 (three) times daily as needed for anxiety.     amLODipine (NORVASC) 5 MG tablet Take 1 tablet (5 mg total) by mouth daily. 90 tablet 0   amoxicillin-clavulanate (AUGMENTIN) 875-125 MG tablet Take 1 tablet by mouth 2 (two) times daily. 20 tablet 0   bisoprolol (ZEBETA) 5 MG tablet Take 1 tablet by mouth once daily 90 tablet 0   clopidogrel (PLAVIX) 75 MG tablet Take 75 mg by mouth daily.     esomeprazole (NEXIUM) 20 MG capsule Take 40 mg by mouth daily at 12 noon.     fluticasone (FLONASE) 50  MCG/ACT nasal spray Place 2 sprays into both nostrils daily. 16 g 6   losartan (COZAAR) 50 MG tablet Take 1 tablet by mouth once daily 90 tablet 0   ondansetron (ZOFRAN) 4 MG tablet Take 1 tablet (4 mg total) by mouth every 8 (eight) hours as needed for nausea or vomiting. 30 tablet 1   promethazine-dextromethorphan (PROMETHAZINE-DM) 6.25-15 MG/5ML syrup Take 5 mLs by mouth 4 (four) times daily as needed. 118 mL 0   rosuvastatin (CRESTOR) 20 MG tablet Take 1 tablet (20 mg total) by mouth daily. 90 tablet 0   TRELEGY ELLIPTA 100-62.5-25 MCG/ACT AEPB Inhale 1 puff by mouth once daily 60 each 0   Vitamin D, Ergocalciferol, (DRISDOL) 1.25 MG (50000 UNIT) CAPS capsule Take 1 capsule (50,000 Units total) by mouth 2 (two) times a week. 10 capsule 2   No current facility-administered medications on file prior to visit.   Past Medical History:  Diagnosis Date   Acute bronchitis 03/07/2018   Acute hypoxemic respiratory failure ([employer]) 03/07/2018   Acute thromboembolic cerebrovascular accident [facility])    Alcohol abuse    Amphetamine use    Anxiety    ASD (atrial septal defect) 07/19/2018   Asthma    Atypical squamous cells of undetermined significance on cytologic smear of cervix (ASC-US) 12/12/2019   Cervical strain 04/16/2014   Cervicogenic headache 04/16/2014   Chest pain, musculoskeletal 06/26/2019   Fell end of April 2021 > neg cxr 06/26/2019 with parasthesias > try zostrix  Chronic combined systolic and diastolic CHF (congestive heart failure) ([place]) 03/25/2018   Echo 02/2018: EF 40-45 // Echo 07/2018:  EF 55-60, trivial AI, small secundum ASD (L>R shunting).   Chronic pain of left knee 10/09/2021   Chronic sinusitis 09/01/2018   Onset 2005   - augmentin x 10 days 08/31/18    - 12/09/2018 referred to ENT  [facility] [facility] eval >   - Sinus CT p course of augmentin started 05/24/2019 if not better   - Allergy profile 05/24/19  >  Eos 0.1/  IgE  2790    Cigarette smoker 03/07/2018   COPD (chronic obstructive  pulmonary disease) (HCC)    COPD GOLD II/ active smoker  03/30/2018   Active smoker  - 03/30/2018   Walked RA  3  laps @  approx 280f each @ avg pace  stopped due to end of study, mild sob and chest tight, no desats    - Spirometry 03/30/2018  FEV1 2.4  (94%)  Ratio 0.73 s physiologic curvature p > 4 h since last saba   - 03/30/2018    try trelegy sample to see if reduces need for saba    - 04/14/2018  After extensive coaching inhaler device,  effectiveness =    75% fr   CVA (cerebral vascular accident) ([facility]    Essential hypertension    Fibromyalgia    GERD (gastroesophageal reflux disease)    Hyperlipidemia    Hypokalemia    Hypomagnesemia    Low grade squamous intraepithelial lesion (LGSIL) on cervicovaginal cytologic smear 09/12/2018   Migraines    NICM (nonischemic cardiomyopathy) ([place] 03/25/2018   Other acute recurrent sinusitis 10/10/2021   Right leg weakness    Tobacco abuse    Vitamin B12 deficiency    Vitamin D deficiency    Past Surgical History:  Procedure Laterality Date   APPENDECTOMY     KNEE SURGERY Left    RIGHT/LEFT HEART CATH AND CORONARY ANGIOGRAPHY N/A 03/08/2018   Procedure: RIGHT/LEFT HEART CATH AND CORONARY ANGIOGRAPHY;  Surgeon: BLorretta Harp MD;  [facility];  Service: Cardiovascular;  Laterality: N/A;   TUBAL LIGATION      Family History  Problem Relation Age of Onset   Cancer Mother    Colitis Paternal Aunt    Ovarian cancer Maternal Grandmother    Social History   Socioeconomic History   Marital status: Divorced    Spouse name: Not on file   Number of children: 2   Years of education: Not on file   Highest education level: Not on file  Occupational History   Occupation: Textiles  Tobacco Use   Smoking status: Every Day    Packs/day: 1.50    Years: 40.00    Total pack years: 60.00    Types: Cigarettes   Smokeless tobacco: Never  Vaping Use   Vaping Use: Never used  Substance and Sexual Activity   Alcohol use: Yes     Comment: 2 x per week   Drug use: No   Sexual activity: Not Currently  Other Topics Concern   Not on file  Social History Narrative   ** Merged History Encounter **   Patient is right handed.   Patient drinks 3 cups caffeine daily.   Working on 2nd shift now at her job.  08-15-14          Social Determinants of Health   Financial Resource Strain: Low Risk  (10/28/2021)   Overall Financial Resource Strain (CARDIA)  Difficulty of Paying Living Expenses: Not hard at all  Food Insecurity: No Food Insecurity (10/28/2021)   Hunger Vital Sign    Worried About Running Out of Food in the Last Year: Never true    Ran Out of Food in the Last Year: Never true  Transportation Needs: No Transportation Needs (10/28/2021)   PRAPARE - Hydrologist (Medical): No    Lack of Transportation (Non-Medical): No  Physical Activity: Inactive (10/28/2021)   Exercise Vital Sign    Days of Exercise per Week: 0 days    Minutes of Exercise per Session: 0 min  Stress: Stress Concern Present (10/28/2021)   [place]    Feeling of Stress : Very much  Social Connections: Socially Isolated (10/28/2021)   Social Connection and Isolation Panel [NHANES]    Frequency of Communication with Friends and Family: More than three times a week    Frequency of Social Gatherings with Friends and Family: More than three times a week    Attends Religious Services: Never    Marine scientist or Organizations: No    Attends Archivist Meetings: Never    Marital Status: Divorced    Review of Systems  Constitutional:  Negative for chills, fatigue and fever.  HENT:  Negative for congestion, ear pain and sore throat.   Respiratory:  Positive for shortness of breath. Negative for cough.   Cardiovascular:  Negative for chest pain and palpitations.  Gastrointestinal:  Negative for abdominal pain, constipation, diarrhea,  nausea and vomiting.  Endocrine: Negative for polydipsia, polyphagia and polyuria.  Genitourinary:  Negative for difficulty urinating and dysuria.  Musculoskeletal:  Negative for arthralgias, back pain and myalgias.  Skin:  Negative for rash.  Neurological:  Positive for dizziness and weakness (both legs). Negative for headaches.  Psychiatric/Behavioral:  Negative for dysphoric mood. The patient is not nervous/anxious.      Objective:  BP 130/80   Pulse 83   Temp 98 F (36.7 C)   Resp 16   Ht '5\' 2"'$  (1.575 m)   Wt 147 lb (66.7 kg)   SpO2 97%   BMI 26.89 kg/m       02/13/2022    9:32 AM 02/11/2022    2:29 PM 12/18/2021    9:46 AM  BP/Weight  Systolic BP 573 220 254  Diastolic BP 270 84 80  Wt. (Lbs) 146 146.2 149  BMI 26.7 kg/m2 26.23 kg/m2 26.39 kg/m2    Physical Exam Vitals reviewed.  Constitutional:      Appearance: Normal appearance.  HENT:     Head: Normocephalic.     Right Ear: Tympanic membrane normal.     Left Ear: Tympanic membrane normal.     Nose: Nose normal.     Mouth/Throat:     Mouth: Mucous membranes are moist.  Eyes:     Pupils: Pupils are equal, round, and reactive to light.  Cardiovascular:     Rate and Rhythm: Normal rate and regular rhythm.  Pulmonary:     Effort: Pulmonary effort is normal.     Breath sounds: Normal breath sounds.  Abdominal:     General: Bowel sounds are normal.     Palpations: Abdomen is soft.  Musculoskeletal:        General: Normal range of motion.     Cervical back: Neck supple.  Skin:    General: Skin is warm and dry.     Capillary  Refill: Capillary refill takes less than 2 seconds.  Neurological:     General: No focal deficit present.     Mental Status: She is alert and oriented to person, place, and time.  Psychiatric:        Mood and Affect: Mood normal.        Behavior: Behavior normal.       Lab Results  Component Value Date   WBC 6.5 10/28/2021   HGB 12.9 10/28/2021   HCT 39.4 10/28/2021   PLT  343 10/28/2021   GLUCOSE 92 10/28/2021   CHOL 185 10/28/2021   TRIG 155 (H) 10/28/2021   HDL 46 10/28/2021   LDLCALC 112 (H) 10/28/2021   ALT 13 10/28/2021   AST 12 10/28/2021   NA 142 10/28/2021   K 3.6 10/28/2021   CL 103 10/28/2021   CREATININE 0.73 10/28/2021   BUN 10 10/28/2021   CO2 24 10/28/2021   TSH 1.040 04/29/2021      Assessment & Plan:  1. Aortic atherosclerosis (HCC) - rosuvastatin (CRESTOR) 20 MG tablet; Take 1 tablet (20 mg total) by mouth daily.  Dispense: 90 tablet; Refill: 0 -recommend heart healthy diet   2. History of CVA with residual deficit -follow up with cardiology as scheduled -keep appts with PT as scheduled  3. Right leg weakness - gabapentin (NEURONTIN) 300 MG capsule; Take 1 capsule (300 mg total) by mouth at bedtime.  Dispense: 90 capsule; Refill: 1  4. Essential hypertension-well controlled - amLODipine (NORVASC) 5 MG tablet; Take 1 tablet (5 mg total) by mouth daily.  Dispense: 90 tablet; Refill: 0 -DASH diet  5. Cigarette nicotine dependence with nicotine-induced disorder - Varenicline Tartrate, Starter, 0.5 MG X 11 & 1 MG X 42 TBPK; Take 1 each by mouth daily.  Dispense: 1 each; Refill: 0 -smoking cessation    We will call you with lab results Follow-up with PT as scheduled Follow-up with cardiology as scheduled Follow-up 05/05/22 at 7:30, fasting     Follow-up: 11-month (05/05/22 at 7:30)  An After Visit Summary was printed and given to the patient.  I, SRip Harbour NP, have reviewed all documentation for this visit. The documentation on 02/19/22 for the exam, diagnosis, procedures, and orders are all accurate and complete.    Signed, SRip Harbour NP [phone]

## 2022-02-19 NOTE — Patient Instructions (Addendum)
We will call you with lab results Follow-up with PT as scheduled Follow-up with cardiology as scheduled Follow-up 05/05/22 at 7:30, fasting    Managing the Challenge of Quitting Smoking Quitting smoking is a physical and mental challenge. You may have cravings, withdrawal symptoms, and temptation to smoke. Before quitting, work with your health care provider to make a plan that can help you manage quitting. Making a plan before you quit may keep you from smoking when you have the urge to smoke while trying to quit. How to manage lifestyle changes Managing stress Stress can make you want to smoke, and wanting to smoke may cause stress. It is important to find ways to manage your stress. You could try some of the following: Practice relaxation techniques. Breathe slowly and deeply, in through your nose and out through your mouth. Listen to music. Soak in a bath or take a shower. Imagine a peaceful place or vacation. Get some support. Talk with family or friends about your stress. Join a support group. Talk with a counselor or therapist. Get some physical activity. Go for a walk, run, or bike ride. Play a favorite sport. Practice yoga.  Medicines Talk with your health care provider about medicines that might help you deal with cravings and make quitting easier for you. Relationships Social situations can be difficult when you are quitting smoking. To manage this, you can: Avoid parties and other social situations where people might be smoking. Avoid alcohol. Leave right away if you have the urge to smoke. Explain to your family and friends that you are quitting smoking. Ask for support and let them know you might be a bit grumpy. Plan activities where smoking is not an option. General instructions Be aware that many people gain weight after they quit smoking. However, not everyone does. To keep from gaining weight, have a plan in place before you quit, and stick to the plan after you  quit. Your plan should include: Eating healthy snacks. When you have a craving, it may help to: Eat popcorn, or try carrots, celery, or other cut vegetables. Chew sugar-free gum. Changing how you eat. Eat small portion sizes at meals. Eat 4-6 small meals throughout the day instead of 1-2 large meals a day. Be mindful when you eat. You should avoid watching television or doing other things that might distract you as you eat. Exercising regularly. Make time to exercise each day. If you do not have time for a long workout, do short bouts of exercise for 5-10 minutes several times a day. Do some form of strengthening exercise, such as weight lifting. Do some exercise that gets your heart beating and causes you to breathe deeply, such as walking fast, running, swimming, or biking. This is very important. Drinking plenty of water or other low-calorie or no-calorie drinks. Drink enough fluid to keep your urine pale yellow.  How to recognize withdrawal symptoms Your body and mind may experience discomfort as you try to get used to not having nicotine in your system. These effects are called withdrawal symptoms. They may include: Feeling hungrier than normal. Having trouble concentrating. Feeling irritable or restless. Having trouble sleeping. Feeling depressed. Craving a cigarette. These symptoms may surprise you, but they are normal to have when quitting smoking. To manage withdrawal symptoms: Avoid places, people, and activities that trigger your cravings. Remember why you want to quit. Get plenty of sleep. Avoid coffee and other drinks that contain caffeine. These may worsen some of your symptoms. How to manage  cravings Come up with a plan for how to deal with your cravings. The plan should include the following: A definition of the specific situation you want to deal with. An activity or action you will take to replace smoking. A clear idea for how this action will help. The name of  someone who could help you with this. Cravings usually last for 5-10 minutes. Consider taking the following actions to help you with your plan to deal with cravings: Keep your mouth busy. Chew sugar-free gum. Suck on hard candies or a straw. Brush your teeth. Keep your hands and body busy. Change to a different activity right away. Squeeze or play with a ball. Do an activity or a hobby, such as making bead jewelry, practicing needlepoint, or working with wood. Mix up your normal routine. Take a short exercise break. Go for a quick walk, or run up and down stairs. Focus on doing something kind or helpful for someone else. Call a friend or family member to talk during a craving. Join a support group. Contact a quitline. Where to find support To get help or find a support group: Call the [employer]'s Smoking Quitline: 1-800-QUIT-NOW 540-172-0373) Text QUIT to SmokefreeTXT: 157262 Where to find more information Visit these websites to find more information on quitting smoking: U.S. Department of Health and Human Services: www.smokefree.gov American Lung Association: www.freedomfromsmoking.org Centers for Disease Control and Prevention (CDC): http://www.wolf.info/ American Heart Association: www.heart.org Contact a health care provider if: You want to change your plan for quitting. The medicines you are taking are not helping. Your eating feels out of control or you cannot sleep. You feel depressed or become very anxious. Summary Quitting smoking is a physical and mental challenge. You will face cravings, withdrawal symptoms, and temptation to smoke again. Preparation can help you as you go through these challenges. Try different techniques to manage stress, handle social situations, and prevent weight gain. You can deal with cravings by keeping your mouth busy (such as by chewing gum), keeping your hands and body busy, calling family or friends, or contacting a quitline for people who  want to quit smoking. You can deal with withdrawal symptoms by avoiding places where people smoke, getting plenty of rest, and avoiding drinks that contain caffeine. This information is not intended to replace advice given to you by your health care provider. Make sure you discuss any questions you have with your health care provider. Document Revised: 01/24/2021 Document Reviewed: 01/24/2021 Elsevier Patient Education  [place].

## 2022-02-23 ENCOUNTER — Telehealth: Payer: Self-pay

## 2022-02-23 NOTE — Telephone Encounter (Signed)
LVM informing patient that papers have been completed by Larene Beach, however she needs to sign the HIPAA form prior to [place] faxing over the forms.

## 2022-02-24 DIAGNOSIS — Z8673 Personal history of transient ischemic attack (TIA), and cerebral infarction without residual deficits: Secondary | ICD-10-CM | POA: Diagnosis not present

## 2022-02-24 NOTE — Telephone Encounter (Signed)
Patient came in this afternoon and signed the HIPAA form. I gave a copy to the patient, faxed a copy, sent a copy to batch and held onto a copy.

## 2022-02-24 NOTE — Addendum Note (Signed)
Addended by: Rip Harbour on: 02/24/2022 09:52 AM   Modules accepted: Level of Service

## 2022-02-25 ENCOUNTER — Ambulatory Visit (HOSPITAL_COMMUNITY): Payer: BC Managed Care – PPO | Admitting: Certified Registered"

## 2022-02-25 ENCOUNTER — Ambulatory Visit (HOSPITAL_COMMUNITY): Payer: BC Managed Care – PPO

## 2022-02-25 ENCOUNTER — Encounter (HOSPITAL_COMMUNITY)
Admission: RE | Disposition: A | Discharge status: Home / Self Care | Payer: Self-pay | Source: Home / Self Care | Attending: Internal Medicine

## 2022-02-25 ENCOUNTER — Ambulatory Visit (HOSPITAL_COMMUNITY)
Admission: RE | Admit: 2022-02-25 | Discharge: 2022-02-25 | Disposition: A | Discharge status: Home / Self Care | Payer: BC Managed Care – PPO | Attending: Internal Medicine | Admitting: Internal Medicine

## 2022-02-25 ENCOUNTER — Ambulatory Visit (HOSPITAL_BASED_OUTPATIENT_CLINIC_OR_DEPARTMENT_OTHER)
Admission: RE | Admit: 2022-02-25 | Discharge: 2022-02-25 | Disposition: A | Discharge status: Home / Self Care | Payer: BC Managed Care – PPO | Source: Home / Self Care | Attending: Cardiology | Admitting: Cardiology

## 2022-02-25 ENCOUNTER — Encounter (HOSPITAL_COMMUNITY): Payer: Self-pay | Admitting: Internal Medicine

## 2022-02-25 ENCOUNTER — Other Ambulatory Visit: Payer: Self-pay

## 2022-02-25 DIAGNOSIS — I10 Essential (primary) hypertension: Secondary | ICD-10-CM

## 2022-02-25 DIAGNOSIS — R002 Palpitations: Secondary | ICD-10-CM | POA: Insufficient documentation

## 2022-02-25 DIAGNOSIS — Z79899 Other long term (current) drug therapy: Secondary | ICD-10-CM | POA: Insufficient documentation

## 2022-02-25 DIAGNOSIS — I639 Cerebral infarction, unspecified: Secondary | ICD-10-CM

## 2022-02-25 DIAGNOSIS — J431 Panlobular emphysema: Secondary | ICD-10-CM | POA: Diagnosis not present

## 2022-02-25 DIAGNOSIS — I110 Hypertensive heart disease with heart failure: Secondary | ICD-10-CM | POA: Insufficient documentation

## 2022-02-25 DIAGNOSIS — Z8673 Personal history of transient ischemic attack (TIA), and cerebral infarction without residual deficits: Secondary | ICD-10-CM | POA: Diagnosis not present

## 2022-02-25 DIAGNOSIS — I5042 Chronic combined systolic (congestive) and diastolic (congestive) heart failure: Secondary | ICD-10-CM | POA: Diagnosis not present

## 2022-02-25 DIAGNOSIS — Z720 Tobacco use: Secondary | ICD-10-CM

## 2022-02-25 DIAGNOSIS — E782 Mixed hyperlipidemia: Secondary | ICD-10-CM | POA: Insufficient documentation

## 2022-02-25 DIAGNOSIS — F17210 Nicotine dependence, cigarettes, uncomplicated: Secondary | ICD-10-CM | POA: Diagnosis not present

## 2022-02-25 DIAGNOSIS — F17200 Nicotine dependence, unspecified, uncomplicated: Secondary | ICD-10-CM | POA: Diagnosis not present

## 2022-02-25 DIAGNOSIS — I509 Heart failure, unspecified: Secondary | ICD-10-CM | POA: Diagnosis not present

## 2022-02-25 DIAGNOSIS — Q2112 Patent foramen ovale: Secondary | ICD-10-CM | POA: Diagnosis not present

## 2022-02-25 DIAGNOSIS — I700 Atherosclerosis of aorta: Secondary | ICD-10-CM | POA: Insufficient documentation

## 2022-02-25 HISTORY — PX: TEE WITHOUT CARDIOVERSION: SHX5443

## 2022-02-25 HISTORY — PX: BUBBLE STUDY: SHX6837

## 2022-02-25 LAB — ECHO TEE

## 2022-02-25 SURGERY — ECHOCARDIOGRAM, TRANSESOPHAGEAL
Anesthesia: Monitor Anesthesia Care

## 2022-02-25 MED ORDER — SODIUM CHLORIDE 0.9 % IV SOLN: INTRAVENOUS | Status: DC

## 2022-02-25 MED ORDER — PROPOFOL 10 MG/ML IV BOLUS
INTRAVENOUS | Status: DC | PRN
  Administered 2022-02-25: 20 mg via INTRAVENOUS
  Administered 2022-02-25: 50 mg via INTRAVENOUS

## 2022-02-25 MED ORDER — PROPOFOL 500 MG/50ML IV EMUL
INTRAVENOUS | Status: DC | PRN
  Administered 2022-02-25: 150 ug/kg/min via INTRAVENOUS

## 2022-02-25 MED ORDER — LIDOCAINE HCL (CARDIAC) PF 100 MG/5ML IV SOSY
PREFILLED_SYRINGE | INTRAVENOUS | Status: DC | PRN
  Administered 2022-02-25: 20 mg via INTRAVENOUS
  Administered 2022-02-25: 60 mg via INTRAVENOUS

## 2022-02-25 NOTE — CV Procedure (Signed)
    TRANSESOPHAGEAL ECHOCARDIOGRAM   NAME:  Mary Franco    MRN: [id number] DOB:  Jul 31, 1965    ADMIT DATE: 02/25/2022  INDICATIONS: PFO  PROCEDURE:   Informed consent was obtained prior to the procedure. The risks, benefits and alternatives for the procedure were discussed and the patient comprehended these risks.  Risks include, but are not limited to, cough, sore throat, vomiting, nausea, somnolence, esophageal and stomach trauma or perforation, bleeding, low blood pressure, aspiration, pneumonia, infection, trauma to the teeth and death.    Procedural time out performed. The oropharynx was anesthetized with topical 1% benzocaine.    Anesthesia was administered by Dr. Zenia Resides and team.  The patient was administered a total of Propofol 240 mg, Lidocaine 80 mg to achieve and maintain moderate to deep conscious sedation.  The patient's heart rate, blood pressure, and oxygen saturation are monitored continuously during the procedure. The period of conscious sedation is 9 minutes, of which I was present face-to-face 100% of this time.   The transesophageal probe was inserted in the esophagus and stomach without difficulty and multiple views were obtained.   COMPLICATIONS:    There were no immediate complications.  KEY FINDINGS:  Positive Bubble Study. Morphology most consistent with PFO.  Full report to follow. Further management per primary team.   Rudean Haskell, MD [place]  1:08 PM

## 2022-02-25 NOTE — Discharge Instructions (Signed)

## 2022-02-25 NOTE — Transfer of Care (Signed)
Immediate Anesthesia Transfer of Care Note  Patient: Mary Franco  Procedure(s) Performed: TRANSESOPHAGEAL ECHOCARDIOGRAM (TEE) BUBBLE STUDY  Patient Location: PACU and Endoscopy Unit  Anesthesia Type:MAC  Level of Consciousness: awake, alert , and oriented  Airway & Oxygen Therapy: Patient Spontanous Breathing and Patient connected to nasal cannula oxygen  Post-op Assessment: Report given to RN and Post -op Vital signs reviewed and stable  Post vital signs: Reviewed and stable  Last Vitals:  Vitals Value Taken Time  BP    Temp    Pulse    Resp    SpO2      Last Pain:  Vitals:   02/25/22 1313  TempSrc: Temporal  PainSc: 0-No pain      Patients Stated Pain Goal: 3 (91/02/89 0228)  Complications: No notable events documented.

## 2022-02-25 NOTE — Anesthesia Preprocedure Evaluation (Addendum)
Anesthesia Evaluation  Patient identified by MRN, date of birth, ID band Patient awake    Reviewed: Allergy & Precautions, NPO status , Patient's Chart, lab work & pertinent test results  Airway Mallampati: III  TM Distance: >3 FB Neck ROM: Full    Dental  (+) Dental Advisory Given   Pulmonary asthma , neg sleep apnea, COPD,  COPD inhaler, Recent URI  (finished antibiotics yesterday), Current Smoker   Pulmonary exam normal breath sounds clear to auscultation       Cardiovascular hypertension (amlodipine, bisoprolol, losartan), Pt. on medications (-) angina +CHF (NICM)  (-) Past MI and (-) Cardiac Stents  Rhythm:Regular Rate:Normal  HLD, ASD  TTE 07/18/2018: IMPRESSIONS     1. The left ventricle has normal systolic function, with an ejection  fraction of 55-60%. The cavity size was normal. Left ventricular diastolic  parameters were normal. No evidence of left ventricular regional wall  motion abnormalities.   2. The right ventricle has normal systolc function. The cavity was  normal. There is no increase in right ventricular wall thickness.   3. No evidence of mitral valve stenosis.   4. The aortic valve is tricuspid Aortic valve regurgitation is trivial by  color flow Doppler. No stenosis of the aortic valve.   5. The ascending aorta, aortic arch, aortic root and descending aorta are  normal in size and structure.   6. Small secundum atrial septal defect with predominantly left to right  shunting across the atrial septum.     Neuro/Psych  Headaches, neg Seizures PSYCHIATRIC DISORDERS Anxiety     CVA (12/25/202312/24/23)    GI/Hepatic ,GERD  ,,(+)     substance abuse  alcohol use  Endo/Other  negative endocrine ROS    Renal/GU negative Renal ROS     Musculoskeletal  (+)  Fibromyalgia -  Abdominal   Peds  Hematology negative hematology ROS (+)   Anesthesia Other Findings Last Plavix: 02/24/2022   Reproductive/Obstetrics                             Anesthesia Physical Anesthesia Plan  ASA: 3  Anesthesia Plan: MAC   Post-op Pain Management:    Induction: Intravenous  PONV Risk Score and Plan: Propofol infusion  Airway Management Planned: Natural Airway and Simple Face Mask  Additional Equipment:   Intra-op Plan:   Post-operative Plan:   Informed Consent: I have reviewed the patients History and Physical, chart, labs and discussed the procedure including the risks, benefits and alternatives for the proposed anesthesia with the patient or authorized representative who has indicated his/her understanding and acceptance.     Dental advisory given  Plan Discussed with: CRNA and Anesthesiologist  Anesthesia Plan Comments: (Discussed with patient risks of MAC including, but not limited to, minor pain or discomfort, hearing people in the room, and possible need for backup general anesthesia. Risks for general anesthesia also discussed including, but not limited to, sore throat, hoarse voice, chipped/damaged teeth, injury to vocal cords, nausea and vomiting, allergic reactions, lung infection, heart attack, stroke, and death. All questions answered. )        Anesthesia Quick Evaluation

## 2022-02-25 NOTE — Progress Notes (Signed)
  Echocardiogram Echocardiogram Transesophageal has been performed.  Johny Chess 02/25/2022, 1:26 PM

## 2022-02-25 NOTE — Interval H&P Note (Signed)
History and Physical Interval Note:  02/25/2022 11:45 AM  Mary Franco  has presented today for surgery, with the diagnosis of Thromboembolic stroke.  The various methods of treatment have been discussed with the patient and family. After consideration of risks, benefits and other options for treatment, the patient has consented to  Procedure(s): TRANSESOPHAGEAL ECHOCARDIOGRAM (TEE) (N/A) as a surgical intervention.  The patient's history has been reviewed, patient examined, no change in status, stable for surgery.  I have reviewed the patient's chart and labs.  Questions were answered to the patient's satisfaction.     [name] A Othella Slappey

## 2022-02-25 NOTE — Anesthesia Postprocedure Evaluation (Signed)
Anesthesia Post Note  Patient: Mary Franco  Procedure(s) Performed: TRANSESOPHAGEAL ECHOCARDIOGRAM (TEE) BUBBLE STUDY     Patient location during evaluation: PACU Anesthesia Type: MAC Level of consciousness: awake Pain management: pain level controlled Vital Signs Assessment: post-procedure vital signs reviewed and stable Respiratory status: spontaneous breathing, nonlabored ventilation and respiratory function stable Cardiovascular status: stable and blood pressure returned to baseline Postop Assessment: no apparent nausea or vomiting Anesthetic complications: no   No notable events documented.  Last Vitals:  Vitals:   02/25/22 1330 02/25/22 1340  BP: (!) 121/59 127/66  Pulse: 70 72  Resp: 14 16  Temp:    SpO2: 95% 94%    Last Pain:  Vitals:   02/25/22 1340  TempSrc:   PainSc: 0-No pain                 Nilda Simmer

## 2022-02-26 ENCOUNTER — Telehealth: Payer: Self-pay

## 2022-02-26 NOTE — Telephone Encounter (Signed)
Mary Franco called with concerns that her ECHO was abnormal yesterday.  She wanted to make [facility] aware of results.

## 2022-02-28 ENCOUNTER — Encounter (HOSPITAL_COMMUNITY): Payer: Self-pay | Admitting: Internal Medicine

## 2022-03-05 ENCOUNTER — Telehealth: Payer: Self-pay | Admitting: Cardiovascular Disease

## 2022-03-05 DIAGNOSIS — I639 Cerebral infarction, unspecified: Secondary | ICD-10-CM

## 2022-03-05 DIAGNOSIS — Q211 Atrial septal defect, unspecified: Secondary | ICD-10-CM

## 2022-03-05 NOTE — Telephone Encounter (Signed)
Called patient and she reported that she had a TEE done on 02/25/22 and was told that she had a hole in her heart. She called today to get information about the hole in her heart and to find out what she needs to tell her employer. After reviewing the chart I did not see where the TEE had been interpreted so I explained to the patient that we would have to send the result to Dr. Geraldo Pitter for him to interpret and then he could decide what the plan is for the patient. Patient was agreeable with this plan and had no further questions at this time.

## 2022-03-05 NOTE — Telephone Encounter (Signed)
Patient calling to get information concern the whole in heart. She states she needs to let her employer know what's going on. Please advise

## 2022-03-05 NOTE — Telephone Encounter (Signed)
Full VM

## 2022-03-09 NOTE — Addendum Note (Signed)
Addended by: Truddie Hidden on: 03/09/2022 12:42 PM   Modules accepted: Orders

## 2022-03-09 NOTE — Telephone Encounter (Signed)
Recommendations reviewed with pt as per Dr. Julien Nordmann note.  Pt verbalized understanding and had no additional questions.

## 2022-03-10 NOTE — Telephone Encounter (Signed)
Per Dr. Geraldo Pitter, scheduled the patient for Structural Heart visit with Dr. Ali Lowe on 2/12. She was grateful for assistance.

## 2022-03-11 DIAGNOSIS — K219 Gastro-esophageal reflux disease without esophagitis: Secondary | ICD-10-CM | POA: Diagnosis not present

## 2022-03-11 DIAGNOSIS — R195 Other fecal abnormalities: Secondary | ICD-10-CM | POA: Diagnosis not present

## 2022-03-30 ENCOUNTER — Encounter: Payer: Self-pay | Admitting: Internal Medicine

## 2022-03-30 ENCOUNTER — Other Ambulatory Visit: Payer: Self-pay | Admitting: Neurology

## 2022-03-30 ENCOUNTER — Ambulatory Visit: Payer: BC Managed Care – PPO | Attending: Internal Medicine | Admitting: Internal Medicine

## 2022-03-30 VITALS — BP 118/62 | HR 75 | Ht 62.0 in | Wt 150.0 lb

## 2022-03-30 DIAGNOSIS — I10 Essential (primary) hypertension: Secondary | ICD-10-CM

## 2022-03-30 DIAGNOSIS — I639 Cerebral infarction, unspecified: Secondary | ICD-10-CM

## 2022-03-30 DIAGNOSIS — E785 Hyperlipidemia, unspecified: Secondary | ICD-10-CM | POA: Diagnosis not present

## 2022-03-30 DIAGNOSIS — I700 Atherosclerosis of aorta: Secondary | ICD-10-CM | POA: Diagnosis not present

## 2022-03-30 DIAGNOSIS — Q2112 Patent foramen ovale: Secondary | ICD-10-CM

## 2022-03-30 NOTE — Progress Notes (Signed)
Cardiology Office Note:    Date:  03/30/2022   ID:  Mary Franco, DOB 04-29-1965, MRN [id number]  PCP:  Rip Harbour, NP (Inactive)   [facility] Providers Cardiologist:  Lenna Sciara, MD Referring MD: Rip Harbour, NP   Chief Complaint/Reason for Referral: Intra-atrial communication with a history of stroke  ASSESSMENT:    1. Thromboembolic stroke ([place])   2. Essential hypertension   3. Hyperlipidemia LDL goal <70   4. Aortic atherosclerosis ([facility])     PLAN:    In order of problems listed above: 1.  History of stroke: Continue aspirin and Plavix.  While I think it makes sense to pursue closure in this patient her ROPE score is intermediate (5).  She is at the upper end of age for inclusion in the the trials of PFO closure with that upper end being 57 years of age.  For this reason I will have the patient see neurology for an opinion about her appropriateness for PFO closure (shared decision-making).  Given the fact that she developed multiple areas of infarction this is suggestive of an embolic stroke and I at this time do not see any other source of embolism other than her intracardiac shunt.  If neurology opinion is that this is appropriate then I will have the patient return to discuss closure.  Additionally the patient has not seen a dentist in some time of asked her to seek dental care in case closure is required. 2.  Hypertension:  Blood pressure is well controlled 3.  Hyperlipidemia: Continue Crestor; goal LDL with a history of stroke is now less than 55. 4.  Aortic atherosclerosis: Continue aspirin, Crestor, and strict blood pressure control.             Dispo:  No follow-ups on file.      Medication Adjustments/Labs and Tests Ordered: Current medicines are reviewed at length with the patient today.  Concerns regarding medicines are outlined above.  The following changes have been made:  no change   Labs/tests ordered: Orders Placed This  Encounter  Procedures   Ambulatory referral to Neurology   EKG 12-Lead    Medication Changes: No orders of the defined types were placed in this encounter.    Current medicines are reviewed at length with the patient today.  The patient does not have concerns regarding medicines.   History of Present Illness:    FOCUSED PROBLEM LIST:   1.  Longstanding tobacco abuse with COPD 2.  Acute stroke December 2024 [facility]; CTA demonstrated no large vessel occlusion with moderate atherosclerosis of the carotid bifurcations without hemodynamically significant stenoses with 50% stenosis of the left subclavian.  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  Aortic atherosclerosis seen on TEE January 2024  The patient is a 57 y.o. female with the indicated medical history here for recommendations regarding history of stroke with a TEE demonstrating an atrial level communication.  The patient was admitted to [facility] with acute right lower extremity weakness in late December.  She was diagnosed with an acute stroke and underwent an evaluation including a CTA and MRI as detailed above.  An echocardiogram demonstrated preserved LV function with no ventricular thrombus and a bubble study was not performed.  She was  discharged on dual antiplatelet therapy and atorvastatin.  The patient has been well since discharge.  She is still out of work and scheduled to return to work on the 25th.  She denies any signs or symptoms of recurrent stroke.  She denies any palpitations, paroxysmal nocturnal dyspnea, orthopnea.  She denies any chest pain.  She has had no visual disturbances or recurrent leg pain.  She denies any slurred speech.  She  has had no severe bleeding or bruising while on dual antiplatelet therapy.       Previous Medical History:   Current Medications: Current Meds  Medication Sig   acetaminophen (TYLENOL) 500 MG tablet Take 1,000 mg by mouth every 6 (six) hours as needed for moderate pain.   albuterol (VENTOLIN HFA) 108 (90 Base) MCG/ACT inhaler Inhale 2 puffs into the lungs every 6 (six) hours as needed for wheezing or shortness of breath.   alprazolam (XANAX) 2 MG tablet Take 2 mg by mouth at bedtime.   amLODipine (NORVASC) 5 MG tablet Take 1 tablet (5 mg total) by mouth daily.   aspirin EC 81 MG tablet Take 81 mg by mouth daily. Swallow whole.   bisoprolol (ZEBETA) 5 MG tablet Take 1 tablet by mouth once daily   clopidogrel (PLAVIX) 75 MG tablet Take 75 mg by mouth daily.   esomeprazole (NEXIUM) 20 MG capsule Take 40 mg by mouth daily.   Fluticasone-Umeclidin-Vilant (TRELEGY ELLIPTA) 200-62.5-25 MCG/ACT AEPB Inhale 1 puff into the lungs daily.   gabapentin (NEURONTIN) 300 MG capsule Take 1 capsule (300 mg total) by mouth at bedtime. (Patient taking differently: Take 300 mg by mouth at bedtime as needed (pain).)   losartan (COZAAR) 50 MG tablet Take 1 tablet by mouth once daily   ondansetron (ZOFRAN) 4 MG tablet Take 1 tablet (4 mg total) by mouth every 8 (eight) hours as needed for nausea or vomiting.   promethazine-dextromethorphan (PROMETHAZINE-DM) 6.25-15 MG/5ML syrup Take 5 mLs by mouth 4 (four) times daily as needed.   rosuvastatin (CRESTOR) 20 MG tablet Take 1 tablet (20 mg total) by mouth daily.   Varenicline Tartrate, Starter, 0.5 MG X 11 & 1 MG X 42 TBPK Take 1 each by mouth daily.     Allergies:    Codeine, Sulfa antibiotics, and Spironolactone   Social History:   Social History   Tobacco Use   Smoking status: Every Day    Packs/day: 1.50    Years: 40.00    Total pack years: 60.00    Types: Cigarettes   Smokeless tobacco: Never  Vaping Use   Vaping Use: Never used  Substance Use  Topics   Alcohol use: Yes    Comment: 2 x per week   Drug use: No     Family Hx: Family History  Problem Relation Age of Onset   Cancer Mother    Colitis Paternal Aunt    Ovarian cancer Maternal Grandmother      Review of Systems:   Please see the history of present illness.    All other systems reviewed and are negative.     EKGs/Labs/Other Test Reviewed:    EKG:  EKG performed today that I personally reviewed demonstrates sinus rhythm with prolonged QT.  Prior CV studies:  TEE 2024:  1. Evidence of atrial level shunting detected by color flow Doppler.  Agitated saline contrast bubble study was positive with shunting observed  within 3-6 cardiac cycles suggestive of interatrial shunt.   2. Left ventricular ejection fraction, by estimation,  is 55 to 60%. The  left ventricle has normal function.   3. Right ventricular systolic function is normal. The right ventricular  size is normal.   4. No left atrial/left atrial appendage thrombus was detected. The LAA  emptying velocity was 44 cm/s.   5. The mitral valve is normal in structure. No evidence of mitral valve  regurgitation. No evidence of mitral stenosis.   6. The aortic valve is tricuspid. Aortic valve regurgitation is not  visualized. No aortic stenosis is present.   7. There is Moderate (Grade III) plaque involving the ascending aorta and  descending aorta.   Coronary angiography 2020 with no obstructive coronary artery disease  Other studies Reviewed: Review of the additional studies/records demonstrates: CT angiography 2024 with no large vessel occlusion with mild to moderate carotid bifurcation disease and left subclavian disease.  Recent Labs: 04/29/2021: TSH 1.040 10/28/2021: ALT 13; BUN 10; Creatinine, Ser 0.73; Hemoglobin 12.9; Platelets 343; Potassium 3.6; Sodium 142   Recent Lipid Panel Lab Results  Component Value Date/Time   CHOL 185 10/28/2021 08:10 AM   TRIG 155 (H) 10/28/2021 08:10 AM   HDL 46  10/28/2021 08:10 AM   LDLCALC 112 (H) 10/28/2021 08:10 AM    Risk Assessment/Calculations:                Physical Exam:    VS:  BP 118/62   Pulse 75   Ht 5' 2"$  (1.575 m)   Wt 150 lb (68 kg)   SpO2 94%   BMI 27.44 kg/m    Wt Readings from Last 3 Encounters:  03/30/22 150 lb (68 kg)  02/19/22 147 lb (66.7 kg)  02/13/22 146 lb (66.2 kg)    GENERAL:  No apparent distress, AOx3 HEENT:  No carotid bruits, +2 carotid impulses, no scleral icterus CAR: RRR no murmurs, gallops, rubs, or thrills RES:  Clear to auscultation bilaterally ABD:  Soft, nontender, nondistended, positive bowel sounds x 4 VASC:  +2 radial pulses, +2 carotid pulses, palpable pedal pulses NEURO:  CN 2-12 grossly intact; motor and sensory grossly intact PSYCH:  No active depression or anxiety EXT:  No edema, ecchymosis, or cyanosis  Signed, Early Osmond, MD  03/30/2022 9:55 AM    [address], [place], [place]  [address] Phone: [phone]; Fax: [phone]   Note:  This document was prepared using Dragon voice recognition software and may include unintentional dictation errors.

## 2022-03-30 NOTE — Patient Instructions (Addendum)
Medication Instructions:  Your physician recommends that you continue on your current medications as directed. Please refer to the Current Medication list given to you today.  *If you need a refill on your cardiac medications before your next appointment, please call your pharmacy*  Lab Work: None  Testing/Procedures: None  Follow-Up: To be determined after consult with neurologist.  Other Instructions You have been referred to Dr. Leonie Man (neurologist), you will be contacted to schedule this appointment.

## 2022-03-31 ENCOUNTER — Telehealth: Payer: Self-pay

## 2022-03-31 DIAGNOSIS — I4729 Other ventricular tachycardia: Secondary | ICD-10-CM

## 2022-03-31 DIAGNOSIS — E785 Hyperlipidemia, unspecified: Secondary | ICD-10-CM

## 2022-03-31 NOTE — Telephone Encounter (Signed)
-----   Message from Jenean Lindau, MD sent at 03/30/2022  5:19 PM EST ----- Mildly abnormal monitoring.  Please bring patient in for a Chem-7 magnesium liver lipid check.  Copy primary care Jenean Lindau, MD 03/30/2022 5:18 PM

## 2022-04-01 ENCOUNTER — Encounter: Payer: Self-pay | Admitting: Family Medicine

## 2022-04-02 DIAGNOSIS — I4729 Other ventricular tachycardia: Secondary | ICD-10-CM | POA: Diagnosis not present

## 2022-04-02 DIAGNOSIS — E785 Hyperlipidemia, unspecified: Secondary | ICD-10-CM | POA: Diagnosis not present

## 2022-04-03 ENCOUNTER — Telehealth: Payer: Self-pay

## 2022-04-03 LAB — COMPREHENSIVE METABOLIC PANEL
ALT: 8 IU/L (ref 0–32)
AST: 14 IU/L (ref 0–40)
Albumin/Globulin Ratio: 1.8 (ref 1.2–2.2)
Albumin: 4.1 g/dL (ref 3.8–4.9)
BUN/Creatinine Ratio: 13 (ref 9–23)
BUN: 10 mg/dL (ref 6–24)
Bilirubin Total: <0.2 mg/dL (ref 0.0–1.2)
CO2: 26 mmol/L (ref 20–29)
Chloride: 99 mmol/L (ref 96–106)
Creatinine, Ser: 0.78 mg/dL (ref 0.57–1.00)
Globulin, Total: 2.3 g/dL (ref 1.5–4.5)
Glucose: 87 mg/dL (ref 70–99)
Sodium: 145 mmol/L — ABNORMAL HIGH (ref 134–144)
Total Protein: 6.4 g/dL (ref 6.0–8.5)
eGFR: 88 mL/min/{1.73_m2} (ref >59)

## 2022-04-03 LAB — LIPID PANEL
Chol/HDL Ratio: 3.4 ratio (ref 0.0–4.4)
Cholesterol, Total: 140 mg/dL (ref 100–199)
HDL: 41 mg/dL (ref >39)
LDL Chol Calc (NIH): 77 mg/dL (ref 0–99)
Triglycerides: 124 mg/dL (ref 0–149)
VLDL Cholesterol Cal: 22 mg/dL (ref 5–40)

## 2022-04-03 LAB — MAGNESIUM: Magnesium: 0.8 mg/dL — CL (ref 1.6–2.3)

## 2022-04-03 NOTE — Telephone Encounter (Signed)
Results reviewed with pt as per Dr. Julien Nordmann note. Agreed to take Magnesium 448m 1 twice daily and come back for lab work on Monday. Pt verbalized understanding and had no additional questions. Routed to PCP.

## 2022-04-07 LAB — MAGNESIUM: Magnesium: 1.5 mg/dL — ABNORMAL LOW (ref 1.6–2.3)

## 2022-04-08 ENCOUNTER — Telehealth: Payer: Self-pay

## 2022-04-08 ENCOUNTER — Encounter: Payer: Self-pay | Admitting: Gastroenterology

## 2022-04-08 DIAGNOSIS — E876 Hypokalemia: Secondary | ICD-10-CM

## 2022-04-08 NOTE — Telephone Encounter (Signed)
-----   Message from Jenean Lindau, MD sent at 04/08/2022  9:15 AM EST ----- Let her take 1200 mg for 4 days and then drop down to 800 mg.  Recheck in 1 week. ----- Message ----- From: Truddie Hidden, RN Sent: 04/08/2022   8:17 AM EST To: Jenean Lindau, MD  Pt is currently taking 800 mg of magnesium. Do you want her to take 1600 mg? ----- Message ----- From: Jenean Lindau, MD Sent: 04/07/2022   8:31 AM EST To: Truddie Hidden, RN  Currently double the dose and recheck on Monday.  Copy primary care Jenean Lindau, MD 04/07/2022 8:31 AM

## 2022-04-20 ENCOUNTER — Other Ambulatory Visit: Payer: Self-pay

## 2022-04-20 DIAGNOSIS — Z79899 Other long term (current) drug therapy: Secondary | ICD-10-CM

## 2022-04-20 DIAGNOSIS — I5042 Chronic combined systolic (congestive) and diastolic (congestive) heart failure: Secondary | ICD-10-CM

## 2022-04-20 DIAGNOSIS — I10 Essential (primary) hypertension: Secondary | ICD-10-CM

## 2022-04-20 MED ORDER — CLOPIDOGREL BISULFATE 75 MG PO TABS: 75.0000 mg | ORAL_TABLET | Freq: Every day | ORAL | 0 refills | Status: DC

## 2022-04-20 MED ORDER — LOSARTAN POTASSIUM 50 MG PO TABS: 50.0000 mg | ORAL_TABLET | Freq: Every day | ORAL | 0 refills | Status: DC

## 2022-04-22 DIAGNOSIS — M25562 Pain in left knee: Secondary | ICD-10-CM | POA: Diagnosis not present

## 2022-04-22 DIAGNOSIS — G8929 Other chronic pain: Secondary | ICD-10-CM | POA: Diagnosis not present

## 2022-05-05 ENCOUNTER — Telehealth: Payer: Self-pay

## 2022-05-05 ENCOUNTER — Ambulatory Visit: Payer: BC Managed Care – PPO | Admitting: Nurse Practitioner

## 2022-05-05 NOTE — Telephone Encounter (Signed)
Patient called to reschedule her missed appointment today and to see about getting an acute appointment scheduled for congestion, productive cough-yellow color, ST. sxs started one and half weeks ago. denies any fever. Patient notified that we do not have any openings for today or tomorrow. It was advised to go to UC to be seen.

## 2022-05-11 ENCOUNTER — Other Ambulatory Visit: Payer: Self-pay | Admitting: Physician Assistant

## 2022-05-11 ENCOUNTER — Other Ambulatory Visit: Payer: Self-pay

## 2022-05-11 MED ORDER — BISOPROLOL FUMARATE 5 MG PO TABS: 5.0000 mg | ORAL_TABLET | Freq: Every day | ORAL | 0 refills | Status: DC

## 2022-05-12 MED ORDER — BISOPROLOL FUMARATE 5 MG PO TABS: 5.0000 mg | ORAL_TABLET | Freq: Every day | ORAL | 0 refills | Status: DC

## 2022-05-13 DIAGNOSIS — S83512S Sprain of anterior cruciate ligament of left knee, sequela: Secondary | ICD-10-CM | POA: Diagnosis not present

## 2022-05-13 DIAGNOSIS — M1712 Unilateral primary osteoarthritis, left knee: Secondary | ICD-10-CM | POA: Diagnosis not present

## 2022-05-13 DIAGNOSIS — G8929 Other chronic pain: Secondary | ICD-10-CM | POA: Diagnosis not present

## 2022-05-13 DIAGNOSIS — M25562 Pain in left knee: Secondary | ICD-10-CM | POA: Diagnosis not present

## 2022-05-16 ENCOUNTER — Other Ambulatory Visit: Payer: Self-pay | Admitting: Family Medicine

## 2022-05-19 ENCOUNTER — Encounter: Payer: Self-pay | Admitting: Orthopedic Surgery

## 2022-05-20 ENCOUNTER — Other Ambulatory Visit: Payer: Self-pay | Admitting: Orthopedic Surgery

## 2022-05-20 DIAGNOSIS — M25562 Pain in left knee: Secondary | ICD-10-CM

## 2022-05-23 ENCOUNTER — Ambulatory Visit
Admission: RE | Admit: 2022-05-23 | Discharge: 2022-05-23 | Disposition: A | Discharge status: Home / Self Care | Payer: BC Managed Care – PPO | Source: Ambulatory Visit | Attending: Orthopedic Surgery | Admitting: Orthopedic Surgery

## 2022-05-23 DIAGNOSIS — M25562 Pain in left knee: Secondary | ICD-10-CM | POA: Diagnosis not present

## 2022-05-27 DIAGNOSIS — S83512S Sprain of anterior cruciate ligament of left knee, sequela: Secondary | ICD-10-CM | POA: Diagnosis not present

## 2022-05-27 DIAGNOSIS — S83249A Other tear of medial meniscus, current injury, unspecified knee, initial encounter: Secondary | ICD-10-CM | POA: Diagnosis not present

## 2022-05-27 DIAGNOSIS — M1712 Unilateral primary osteoarthritis, left knee: Secondary | ICD-10-CM | POA: Diagnosis not present

## 2022-06-05 ENCOUNTER — Ambulatory Visit: Payer: BC Managed Care – PPO | Admitting: Nurse Practitioner

## 2022-06-08 ENCOUNTER — Other Ambulatory Visit: Payer: Self-pay

## 2022-06-08 DIAGNOSIS — I10 Essential (primary) hypertension: Secondary | ICD-10-CM

## 2022-06-08 DIAGNOSIS — I700 Atherosclerosis of aorta: Secondary | ICD-10-CM

## 2022-06-08 MED ORDER — AMLODIPINE BESYLATE 5 MG PO TABS: 5.0000 mg | ORAL_TABLET | Freq: Every day | ORAL | 0 refills | Status: DC

## 2022-06-08 MED ORDER — ROSUVASTATIN CALCIUM 20 MG PO TABS: 20.0000 mg | ORAL_TABLET | Freq: Every day | ORAL | 0 refills | Status: DC

## 2022-06-09 ENCOUNTER — Telehealth: Payer: Self-pay

## 2022-06-09 DIAGNOSIS — Z13820 Encounter for screening for osteoporosis: Secondary | ICD-10-CM | POA: Diagnosis not present

## 2022-06-09 DIAGNOSIS — J019 Acute sinusitis, unspecified: Secondary | ICD-10-CM | POA: Diagnosis not present

## 2022-06-09 DIAGNOSIS — Z1231 Encounter for screening mammogram for malignant neoplasm of breast: Secondary | ICD-10-CM | POA: Diagnosis not present

## 2022-06-09 DIAGNOSIS — M5432 Sciatica, left side: Secondary | ICD-10-CM | POA: Diagnosis not present

## 2022-06-09 NOTE — Telephone Encounter (Signed)
Pt called today to request a same day appointment for the following symptoms:coughing, chest congestion, HA, fatigue, weakness. sxs started about a month ago and is not getting better. Marland Kitchen Unfortunately our schedule is full and we have no openings. I offered the patient an appointment for this Friday but the patient stated that she will go to UC to be seen.

## 2022-06-11 DIAGNOSIS — K589 Irritable bowel syndrome without diarrhea: Secondary | ICD-10-CM | POA: Insufficient documentation

## 2022-06-11 HISTORY — DX: Irritable bowel syndrome, unspecified: K58.9

## 2022-06-16 ENCOUNTER — Ambulatory Visit: Payer: BC Managed Care – PPO | Admitting: Neurology

## 2022-06-16 ENCOUNTER — Encounter: Payer: Self-pay | Admitting: Neurology

## 2022-06-16 VITALS — BP 140/84 | HR 64 | Ht 62.0 in | Wt 158.8 lb

## 2022-06-16 DIAGNOSIS — R29898 Other symptoms and signs involving the musculoskeletal system: Secondary | ICD-10-CM | POA: Diagnosis not present

## 2022-06-16 DIAGNOSIS — I639 Cerebral infarction, unspecified: Secondary | ICD-10-CM

## 2022-06-16 DIAGNOSIS — Q2112 Patent foramen ovale: Secondary | ICD-10-CM | POA: Diagnosis not present

## 2022-06-16 DIAGNOSIS — E782 Mixed hyperlipidemia: Secondary | ICD-10-CM | POA: Diagnosis not present

## 2022-06-16 NOTE — Progress Notes (Signed)
[facility] [address] [place]. [place] [address] [phone]       OFFICE CONSULT NOTE  [name] Date of Birth:  Jun 22, 1965 Medical Record Number:  [id number]   Referring MD:: Alverda Skeans  Reason for Referral: Stroke and PFO  HPI: Mary Franco is a 57 year old Caucasian lady seen today for initial office consultation visit for stroke.  History is obtained from the patient and review of electronic medical records I personally reviewed the available pertinent imaging films in PACS.  She has past medical history of DM, alcohol and amphetamine abuse, hyperlipidemia, hypertension, nonischemic cardiomyopathy, fibromyalgia and gastroesophageal reflux disease.  Patient woke up on 02/08/2022 and noticed heaviness weakness and numbness in the right leg.  Much of it was as the day went on she noticed worsening of her speech as well and increased leg weakness.  She was not able to continue at work and went home.  Family convinced her to go to the ER that night and use went to [facility] where she was admitted for 4 days.  MRI scan of the brain small embolic infarct involving left insular and parietal lobes.  CT angiogram of brain and neck showed 50% bilateral carotid bifurcation stenosis with no large vessel occlusion.  2D echo showed normal ejection fraction.  Dilated with an outpatient on 02/25/2022 showed ejection fraction 55 to 60%.  No clots.  There was evidence of a small right to left.  LDL cholesterol was 77 mg percent.  Patient did wear a 30-day heart monitor which was done in January but I did not find the results patient is unaware of the results-patient has since been taking aspirin and Plavix and tolerating well with only minor bruising and no bleeding.  She said right leg weakness has recovered completely.  She is more bothered by left knee which is chronic due to her torn ACL and the knee brace.  She plans to have left knee surgery soon but prior to that  she needs dental extraction he is here today asking for neurological clearance for both procedures.  She denies any prior history of strokes TIAs or significant neurological problems..There is family history of stroke in her grandfather.  Patient smokes and knows that she needs to quit.  Will refer to cardiologist Dr Lynnette Caffey who wants an opinion about possible endovascular PFO closure.  She denies any prior Palpitations, atrial fibrillation, syncope or significant coronary artery disease. ROS:   14 system review of systems is positive for leg weakness, numbness, speech difficulty, knee pain, difficulty walking and all other systems negative  PMH:  Past Medical History:  Diagnosis Date   Acute bronchitis 03/07/2018   Acute hypoxemic respiratory failure (HCC) 03/07/2018   Acute thromboembolic cerebrovascular accident [facility])    Alcohol abuse    Amphetamine use    Anxiety    ASD (atrial septal defect) 07/19/2018   Asthma    Atypical squamous cells of undetermined significance on cytologic smear of cervix (ASC-US) 12/12/2019   Cervical strain 04/16/2014   Cervicogenic headache 04/16/2014   Chest pain, musculoskeletal 06/26/2019   Fell end of April 2021 > neg cxr 06/26/2019 with parasthesias > try zostrix    Chronic combined systolic and diastolic CHF (congestive heart failure) (HCC) 03/25/2018   Echo 02/2018: EF 40-45 // Echo 07/2018:  EF 55-60, trivial AI, small secundum ASD (L>R shunting).   Chronic pain of left knee 10/09/2021   Chronic sinusitis 09/01/2018   Onset 2005   - augmentin  x 10 days 08/31/18    - 12/09/2018 referred to ENT  [place] eval >   - Sinus CT p course of augmentin started 05/24/2019 if not better   - Allergy profile 05/24/19  >  Eos 0.1/  IgE  2790    Cigarette smoker 03/07/2018   COPD (chronic obstructive pulmonary disease) (HCC)    COPD GOLD II/ active smoker  03/30/2018   Active smoker  - 03/30/2018   Walked RA  3  laps @  approx 230ft each @ avg pace  stopped due to end of  study, mild sob and chest tight, no desats    - Spirometry 03/30/2018  FEV1 2.4  (94%)  Ratio 0.73 s physiologic curvature p > 4 h since last saba   - 03/30/2018    try trelegy sample to see if reduces need for saba    - 04/14/2018  After extensive coaching inhaler device,  effectiveness =    75% fr   CVA (cerebral vascular accident) (HCC)    Essential hypertension    Fibromyalgia    GERD (gastroesophageal reflux disease)    Hyperlipidemia    Hypokalemia    Hypomagnesemia    Low grade squamous intraepithelial lesion (LGSIL) on cervicovaginal cytologic smear 09/12/2018   Migraines    NICM (nonischemic cardiomyopathy) (HCC) 03/25/2018   Other acute recurrent sinusitis 10/10/2021   Right leg weakness    Tobacco abuse    Vitamin B12 deficiency    Vitamin D deficiency     Social History:  Social History   Socioeconomic History   Marital status: Divorced    Spouse name: Not on file   Number of children: 2   Years of education: Not on file   Highest education level: Not on file  Occupational History   Occupation: Textiles  Tobacco Use   Smoking status: Every Day    Packs/day: 1.50    Years: 40.00    Additional pack years: 0.00    Total pack years: 60.00    Types: Cigarettes   Smokeless tobacco: Never  Vaping Use   Vaping Use: Never used  Substance and Sexual Activity   Alcohol use: Yes    Comment: 2 x per week   Drug use: No   Sexual activity: Not Currently  Other Topics Concern   Not on file  Social History Narrative   ** Merged History Encounter **   Patient is right handed.   Patient drinks 3 cups caffeine daily.   Working on 2nd shift now at her job.  08-15-14          Social Determinants of Health   Financial Resource Strain: Low Risk  (10/28/2021)   Overall Financial Resource Strain (CARDIA)    Difficulty of Paying Living Expenses: Not hard at all  Food Insecurity: No Food Insecurity (10/28/2021)   Hunger Vital Sign    Worried About Running Out of Food in the  Last Year: Never true    Ran Out of Food in the Last Year: Never true  Transportation Needs: No Transportation Needs (10/28/2021)   PRAPARE - Administrator, Civil Service (Medical): No    Lack of Transportation (Non-Medical): No  Physical Activity: Inactive (10/28/2021)   Exercise Vital Sign    Days of Exercise per Week: 0 days    Minutes of Exercise per Session: 0 min  Stress: Stress Concern Present (10/28/2021)   [employer] of Occupational Health - Occupational Stress Questionnaire    Feeling of  Stress : Very much  Social Connections: Socially Isolated (10/28/2021)   Social Connection and Isolation Panel [NHANES]    Frequency of Communication with Friends and Family: More than three times a week    Frequency of Social Gatherings with Friends and Family: More than three times a week    Attends Religious Services: Never    Database administrator or Organizations: No    Attends Banker Meetings: Never    Marital Status: Divorced  Catering manager Violence: Not At Risk (10/28/2021)   Humiliation, Afraid, Rape, and Kick questionnaire    Fear of Current or Ex-Partner: No    Emotionally Abused: No    Physically Abused: No    Sexually Abused: No    Medications:   Current Outpatient Medications on File Prior to Visit  Medication Sig Dispense Refill   acetaminophen (TYLENOL) 500 MG tablet Take 1,000 mg by mouth every 6 (six) hours as needed for moderate pain.     alprazolam (XANAX) 2 MG tablet Take 2 mg by mouth at bedtime.     amLODipine (NORVASC) 5 MG tablet Take 1 tablet (5 mg total) by mouth daily. 90 tablet 0   aspirin EC 81 MG tablet Take 81 mg by mouth daily. Swallow whole.     bisoprolol (ZEBETA) 5 MG tablet Take 1 tablet (5 mg total) by mouth daily. 90 tablet 0   clopidogrel (PLAVIX) 75 MG tablet Take 1 tablet by mouth once daily 30 tablet 1   esomeprazole (NEXIUM) 20 MG capsule Take 40 mg by mouth daily.     gabapentin (NEURONTIN) 300 MG capsule  Take 1 capsule (300 mg total) by mouth at bedtime. (Patient taking differently: Take 300 mg by mouth at bedtime as needed (pain).) 90 capsule 1   losartan (COZAAR) 50 MG tablet Take 1 tablet (50 mg total) by mouth daily. 90 tablet 0   ondansetron (ZOFRAN) 4 MG tablet Take 1 tablet (4 mg total) by mouth every 8 (eight) hours as needed for nausea or vomiting. 30 tablet 1   promethazine-dextromethorphan (PROMETHAZINE-DM) 6.25-15 MG/5ML syrup Take 5 mLs by mouth 4 (four) times daily as needed. 118 mL 0   rosuvastatin (CRESTOR) 20 MG tablet Take 1 tablet (20 mg total) by mouth daily. 90 tablet 0   Varenicline Tartrate, Starter, 0.5 MG X 11 & 1 MG X 42 TBPK Take 1 each by mouth daily. 1 each 0   Vitamin D, Ergocalciferol, (DRISDOL) 1.25 MG (50000 UNIT) CAPS capsule Take 1 capsule (50,000 Units total) by mouth 2 (two) times a week. 10 capsule 2   albuterol (VENTOLIN HFA) 108 (90 Base) MCG/ACT inhaler Inhale 2 puffs into the lungs every 6 (six) hours as needed for wheezing or shortness of breath. (Patient not taking: Reported on 06/16/2022) 8 g 3   amoxicillin-clavulanate (AUGMENTIN) 875-125 MG tablet Take 1 tablet by mouth 2 (two) times daily. (Patient not taking: Reported on 03/30/2022) 20 tablet 0   fluconazole (DIFLUCAN) 150 MG tablet Take 150 mg by mouth as needed (thrush when taking antibiotic). (Patient not taking: Reported on 03/30/2022)     fluticasone (FLONASE) 50 MCG/ACT nasal spray Place 2 sprays into both nostrils daily. (Patient not taking: Reported on 03/30/2022) 16 g 6   Fluticasone-Umeclidin-Vilant (TRELEGY ELLIPTA) 100-62.5-25 MCG/ACT AEPB Take 1 puff by mouth daily. Rinse mouth afterwards (Patient not taking: Reported on 03/30/2022) 1 each 11   Fluticasone-Umeclidin-Vilant (TRELEGY ELLIPTA) 200-62.5-25 MCG/ACT AEPB Inhale 1 puff into the lungs daily. (Patient not taking:  Reported on 06/16/2022)     No current facility-administered medications on file prior to visit.    Allergies:   Allergies   Allergen Reactions   Codeine Itching   Sulfa Antibiotics Itching   Spironolactone     Electrolyte imbalance    Physical Exam General: well developed, well nourished pleasant middle-age Caucasian lady, seated, in no evident distress Head: head normocephalic and atraumatic.   Neck: supple with no carotid or supraclavicular bruits Cardiovascular: regular rate and rhythm, no murmurs Musculoskeletal: no deformity Skin:  no rash/petichiae Vascular:  Normal pulses all extremities  Neurologic Exam Mental Status: Awake and fully alert. Oriented to place and time. Recent and remote memory intact. Attention span, concentration and fund of knowledge appropriate. Mood and affect appropriate.  Cranial Nerves: Fundoscopic exam reveals sharp disc margins. Pupils equal, briskly reactive to light. Extraocular movements full without nystagmus. Visual fields full to confrontation. Hearing intact. Facial sensation intact. Face, tongue, palate moves normally and symmetrically.  Motor: Normal bulk and tone. Normal strength in all tested extremity muscles. Sensory.: intact to touch , pinprick , position and vibratory sensation.  Coordination: Rapid alternating movements normal in all extremities. Finger-to-nose and heel-to-shin performed accurately bilaterally. Gait and Station: Arises from chair without difficulty. Stance is normal. Gait demonstrates normal stride length and balance .  Left knee due to pain..  Reflexes: 1+ and symmetric. Toes downgoing.   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  Vascular risk factors of mild hyperlipidemia, extracranial atherosclerosis, ongoing tobacco abuse and remote substance abuse and small PFO     PLAN:I had a long d/w patient and her daughter about her  recent cryptogenic stroke, small PFO,risk for recurrent stroke/TIAs, personally  independently reviewed imaging studies and stroke evaluation results and answered questions.Continue aspirin 81 mg daily  for secondary stroke prevention stop Plavix now as there is no proven benefit beyond 3 weeks for dual antiplatelet therapy and maintain strict control of hypertension with blood pressure goal below 130/90, diabetes with hemoglobin A1c goal below 6.5% and lipids with LDL cholesterol goal below 70 mg/dL. I also advised the patient to eat a healthy diet with plenty of whole grains, cereals, fruits and vegetables, exercise regularly and maintain ideal body weight .check loop recorder for paroxysmal A-fib.  Keep scheduled appointment for TCD bubble study to further characterize the PFO to see if there are any adverse features.  Low ROPE score of 5 would make her only and borderline risk of PFO being causative for her stroke.  Patient was counseled to quit smoking see Primary Care Physician for Smoking Cessation and Voiced Understanding.  Patient is neurologically cleared to undergo tooth extraction and left knee surgery with a small but acceptable periprocedural risk of TIA/stroke need to stop aspirin for 3 days prior to the procedure and resume it after procedure when safe followup in the future with me in 4 months or call earlier if necessary.  Greater than 50% time during this 50-minute consultation visit was spent on counseling and coordination of care about her cryptogenic stroke and PFO and discussion about evaluation Delia Heady, MD  Note: This document was prepared with digital dictation and possible smart phrase technology. Any transcriptional errors that result from this process are unintentional.

## 2022-06-16 NOTE — Patient Instructions (Signed)
I had a long d/w Mary Franco and her daughter about her  recent cryptogenic stroke, small PFO,risk for recurrent stroke/TIAs, personally independently reviewed imaging studies and stroke evaluation results and answered questions.Continue aspirin 81 mg daily  for secondary stroke prevention stop Plavix now as there is no proven benefit beyond 3 weeks for dual antiplatelet therapy and maintain strict control of hypertension with blood pressure goal below 130/90, diabetes with hemoglobin A1c goal below 6.5% and lipids with LDL cholesterol goal below 70 mg/dL. I also advised the Mary Franco to eat a healthy diet with plenty of whole grains, cereals, fruits and vegetables, exercise regularly and maintain ideal body weight .check loop recorder for paroxysmal A-fib.  Keep scheduled appointment for TCD bubble study to further characterize the PFO to see if there are any adverse features.  Low ROPE score of 5 would make her only and borderline risk of PFO being causative for her stroke.  Mary Franco is neurologically cleared to undergo tooth extraction and left knee surgery with a small but acceptable periprocedural risk of TIA/stroke need to stop aspirin for 3 days prior to the procedure and resume it after procedure when safe followup in the future with me in 4 months or call earlier if necessary. Stroke Prevention Some medical conditions and behaviors can lead to a higher chance of having a stroke. You can help prevent a stroke by eating healthy, exercising, not smoking, and managing any medical conditions you have. Stroke is a leading cause of functional impairment. Primary prevention is particularly important because a majority of strokes are first-time events. Stroke changes the lives of not only those who experience a stroke but also their family and other caregivers. How can this condition affect me? A stroke is a medical emergency and should be treated right away. A stroke can lead to brain damage and can sometimes be  life-threatening. If a person gets medical treatment right away, there is a better chance of surviving and recovering from a stroke. What can increase my risk? The following medical conditions may increase your risk of a stroke: Cardiovascular disease. High blood pressure (hypertension). Diabetes. High cholesterol. Sickle cell disease. Blood clotting disorders (hypercoagulable state). Obesity. Sleep disorders (obstructive sleep apnea). Other risk factors include: Being older than age 53. Having a history of blood clots, stroke, or mini-stroke (transient ischemic attack, TIA). Genetic factors, such as race, ethnicity, or a family history of stroke. Smoking cigarettes or using other tobacco products. Taking birth control pills, especially if you also use tobacco. Heavy use of alcohol or drugs, especially cocaine and methamphetamine. Physical inactivity. What actions can I take to prevent this? Manage your health conditions High cholesterol levels. Eating a healthy diet is important for preventing high cholesterol. If cholesterol cannot be managed through diet alone, you may need to take medicines. Take any prescribed medicines to control your cholesterol as told by your health care provider. Hypertension. To reduce your risk of stroke, try to keep your blood pressure below 130/80. Eating a healthy diet and exercising regularly are important for controlling blood pressure. If these steps are not enough to manage your blood pressure, you may need to take medicines. Take any prescribed medicines to control hypertension as told by your health care provider. Ask your health care provider if you should monitor your blood pressure at home. Have your blood pressure checked every year, even if your blood pressure is normal. Blood pressure increases with age and some medical conditions. Diabetes. Eating a healthy diet and exercising regularly are  important parts of managing your blood sugar  (glucose). If your blood sugar cannot be managed through diet and exercise, you may need to take medicines. Take any prescribed medicines to control your diabetes as told by your health care provider. Get evaluated for obstructive sleep apnea. Talk to your health care provider about getting a sleep evaluation if you snore a lot or have excessive sleepiness. Make sure that any other medical conditions you have, such as atrial fibrillation or atherosclerosis, are managed. Nutrition Follow instructions from your health care provider about what to eat or drink to help manage your health condition. These instructions may include: Reducing your daily calorie intake. Limiting how much salt (sodium) you use to 1,500 milligrams (mg) each day. Using only healthy fats for cooking, such as olive oil, canola oil, or sunflower oil. Eating healthy foods. You can do this by: Choosing foods that are high in fiber, such as whole grains, and fresh fruits and vegetables. Eating at least 5 servings of fruits and vegetables a day. Try to fill one-half of your plate with fruits and vegetables at each meal. Choosing lean protein foods, such as lean cuts of meat, poultry without skin, fish, tofu, beans, and nuts. Eating low-fat dairy products. Avoiding foods that are high in sodium. This can help lower blood pressure. Avoiding foods that have saturated fat, trans fat, and cholesterol. This can help prevent high cholesterol. Avoiding processed and prepared foods. Counting your daily carbohydrate intake.  Lifestyle If you drink alcohol: Limit how much you have to: 0-1 drink a day for women who are not pregnant. 0-2 drinks a day for men. Know how much alcohol is in your drink. In the [place] one drink equals one 12 oz bottle of beer ( ), one 5 oz glass of wine ( ), or one 1 oz glass of hard liquor (44mL). Do not use any products that contain nicotine or tobacco. These products include cigarettes, chewing  tobacco, and vaping devices, such as e-cigarettes. If you need help quitting, ask your health care provider. Avoid secondhand smoke. Do not use drugs. Activity  Try to stay at a healthy weight. Get at least 30 minutes of exercise on most days, such as: Fast walking. Biking. Swimming. Medicines Take over-the-counter and prescription medicines only as told by your health care provider. Aspirin or blood thinners (antiplatelets or anticoagulants) may be recommended to reduce your risk of forming blood clots that can lead to stroke. Avoid taking birth control pills. Talk to your health care provider about the risks of taking birth control pills if: You are over 94 years old. You smoke. You get very bad headaches. You have had a blood clot. Where to find more information American Stroke Association: www.strokeassociation.org Get help right away if: You or a loved one has any symptoms of a stroke. "BE FAST" is an easy way to remember the main warning signs of a stroke: B - Balance. Signs are dizziness, sudden trouble walking, or loss of balance. E - Eyes. Signs are trouble seeing or a sudden change in vision. F - Face. Signs are sudden weakness or numbness of the face, or the face or eyelid drooping on one side. A - Arms. Signs are weakness or numbness in an arm. This happens suddenly and usually on one side of the body. S - Speech. Signs are sudden trouble speaking, slurred speech, or trouble understanding what people say. T - Time. Time to call emergency services. Write down what time symptoms started. You or a loved  one has other signs of a stroke, such as: A sudden, severe headache with no known cause. Nausea or vomiting. Seizure. These symptoms may represent a serious problem that is an emergency. Do not wait to see if the symptoms will go away. Get medical help right away. Call your local emergency services (911 in the [place] Do not drive yourself to the hospital. Summary You can help  to prevent a stroke by eating healthy, exercising, not smoking, limiting alcohol intake, and managing any medical conditions you may have. Do not use any products that contain nicotine or tobacco. These include cigarettes, chewing tobacco, and vaping devices, such as e-cigarettes. If you need help quitting, ask your health care provider. Remember "BE FAST" for warning signs of a stroke. Get help right away if you or a loved one has any of these signs. This information is not intended to replace advice given to you by your health care provider. Make sure you discuss any questions you have with your health care provider. Document Revised: 08/17/2019 Document Reviewed: 09/04/2019 Elsevier Mary Franco Education  2023 [employer].

## 2022-06-17 ENCOUNTER — Other Ambulatory Visit (HOSPITAL_COMMUNITY): Payer: BC Managed Care – PPO

## 2022-06-22 ENCOUNTER — Telehealth: Payer: Self-pay | Admitting: Cardiology

## 2022-06-22 NOTE — Telephone Encounter (Signed)
   Patient Name: Mary Franco  DOB: February 03, 1966 MRN: [id number]  Primary Cardiologist: Kristeen Miss, MD  Chart reviewed as part of pre-operative protocol coverage.   Dental extractions of 1-2 teeth are considered low risk procedures per guidelines and generally do not require any specific cardiac clearance. It is also generally accepted that for extractions of 1-2 teeth and dental cleanings, there is no need to interrupt blood thinner therapy.  SBE prophylaxis is not required for the patient from a cardiac standpoint.  I will route this recommendation to the requesting party via Epic fax function and remove from pre-op pool.  Please call with questions.  Carlos Levering, NP 06/22/2022, 3:50 PM

## 2022-06-22 NOTE — Telephone Encounter (Signed)
   [facility] Pre-operative Risk Assessment    Request for surgical clearance:  What type of surgery is being performed?  Dental extractions + fillings   When is this surgery scheduled?   TBD  What type of clearance is required (medical clearance vs. Pharmacy clearance to hold med vs. Both)?  Both  Are there any medications that need to be held prior to surgery and how long? Their office is deferring medication recommendations to cardiology   Practice name and name of physician performing surgery?  [facility]  Dr. Vaughan Browner    What is your office phone number? [phone]    7.   What is your office fax number? [phone]  8.   Anesthesia type (None, local, MAC, general)? Local    Mary Franco 06/22/2022, 12:24 PM  _________________________________________________________________   (provider comments below)

## 2022-06-22 NOTE — Telephone Encounter (Signed)
I s/w the DDS office and confirmed that the pt will be having 2 teeth surgically extracted at this time is the plan.

## 2022-06-22 NOTE — Telephone Encounter (Signed)
Pre-op team,   Please contact requesting office to clarify how many extractions.   Thank you!  DW

## 2022-06-23 ENCOUNTER — Telehealth: Payer: Self-pay

## 2022-06-23 ENCOUNTER — Ambulatory Visit (HOSPITAL_COMMUNITY)
Admission: RE | Admit: 2022-06-23 | Discharge: 2022-06-23 | Disposition: A | Discharge status: Home / Self Care | Payer: BC Managed Care – PPO | Source: Ambulatory Visit | Attending: Neurology | Admitting: Neurology

## 2022-06-23 DIAGNOSIS — Q2112 Patent foramen ovale: Secondary | ICD-10-CM | POA: Diagnosis not present

## 2022-06-23 LAB — MAGNESIUM: Magnesium: 1 mg/dL — ABNORMAL LOW (ref 1.6–2.3)

## 2022-06-23 NOTE — Telephone Encounter (Signed)
-----   Message from Garwin Brothers, MD sent at 06/23/2022 10:54 AM EDT ----- Check with pharmacy what magnesium supplementation would be best for her and recheck in 2 weeks.  Copy primary care Garwin Brothers, MD 06/23/2022 10:53 AM

## 2022-06-25 NOTE — Addendum Note (Signed)
Addended by: Eleonore Chiquito on: 06/25/2022 01:59 PM   Modules accepted: Orders

## 2022-06-30 ENCOUNTER — Ambulatory Visit: Payer: BC Managed Care – PPO | Admitting: Physician Assistant

## 2022-07-03 ENCOUNTER — Telehealth: Payer: Self-pay | Admitting: Cardiology

## 2022-07-03 NOTE — Progress Notes (Signed)
The patient was already informed at the time of the test that transcranial Doppler bubble study was positive indicating medium size right to left shunt with Valsalva staining only

## 2022-07-03 NOTE — Telephone Encounter (Signed)
Called patient 07/03/22 for PFO closure appointment with Dr. Lynnette Caffey. Unfortunately the patients voicemail box is full and I was unable to leave a message to return my call. Will attempt again early next week.   Georgie Chard NP-C Structural Heart Team  Pager: [phone] Phone: [phone]'

## 2022-07-09 DIAGNOSIS — E876 Hypokalemia: Secondary | ICD-10-CM | POA: Diagnosis not present

## 2022-07-10 LAB — BASIC METABOLIC PANEL
BUN/Creatinine Ratio: 18 (ref 9–23)
BUN: 20 mg/dL (ref 6–24)
CO2: 24 mmol/L (ref 20–29)
Calcium: 9.2 mg/dL (ref 8.7–10.2)
Chloride: 100 mmol/L (ref 96–106)
Creatinine, Ser: 1.09 mg/dL — ABNORMAL HIGH (ref 0.57–1.00)
Glucose: 139 mg/dL — ABNORMAL HIGH (ref 70–99)
Potassium: 4 mmol/L (ref 3.5–5.2)
Sodium: 139 mmol/L (ref 134–144)
eGFR: 59 mL/min/{1.73_m2} — ABNORMAL LOW (ref >59)

## 2022-07-10 LAB — MAGNESIUM: Magnesium: 1.3 mg/dL — ABNORMAL LOW (ref 1.6–2.3)

## 2022-07-14 ENCOUNTER — Telehealth: Payer: Self-pay

## 2022-07-14 DIAGNOSIS — R87612 Low grade squamous intraepithelial lesion on cytologic smear of cervix (LGSIL): Secondary | ICD-10-CM | POA: Diagnosis not present

## 2022-07-14 NOTE — Telephone Encounter (Signed)
Received Surgical Clearance fax from [facility], will placed forms on Dr. Pearlean Brownie in basket.

## 2022-07-15 ENCOUNTER — Other Ambulatory Visit: Payer: Self-pay | Admitting: Family Medicine

## 2022-07-15 ENCOUNTER — Telehealth: Payer: Self-pay

## 2022-07-15 DIAGNOSIS — I10 Essential (primary) hypertension: Secondary | ICD-10-CM

## 2022-07-15 DIAGNOSIS — Z79899 Other long term (current) drug therapy: Secondary | ICD-10-CM

## 2022-07-15 DIAGNOSIS — I5042 Chronic combined systolic (congestive) and diastolic (congestive) heart failure: Secondary | ICD-10-CM

## 2022-07-15 NOTE — Telephone Encounter (Signed)
Results reviewed with pt as per Dr. Revankar's note.  Pt verbalized understanding and had no additional questions. Routed to PCP.  

## 2022-07-15 NOTE — Telephone Encounter (Signed)
Patient is returning call.  °

## 2022-07-15 NOTE — Telephone Encounter (Signed)
Left vm to call back

## 2022-07-15 NOTE — Telephone Encounter (Signed)
-----   Message from Garwin Brothers, MD sent at 07/15/2022  8:40 AM EDT ----- Magnesium is low. Needs to f/u with pcp. Please add extra dose of Mg and make sure pt is taking regular.. cc pcp Garwin Brothers, MD 07/15/2022 8:39 AM

## 2022-07-16 ENCOUNTER — Telehealth: Payer: Self-pay

## 2022-07-16 NOTE — Telephone Encounter (Signed)
Called patient to notified that her surgery clearance can be done at her appointment on 08/04/2022, also made her aware that she will have to be cleared by her neurologist and cardiologist as well.  Patient stated she has already been clear by her neurologist and she is seeing her cardiologist as well to be cleared by them.

## 2022-07-27 ENCOUNTER — Ambulatory Visit: Payer: BC Managed Care – PPO | Attending: Cardiology | Admitting: Cardiology

## 2022-07-27 ENCOUNTER — Encounter: Payer: Self-pay | Admitting: Cardiology

## 2022-07-27 VITALS — BP 140/90 | HR 74 | Ht 62.0 in | Wt 160.0 lb

## 2022-07-27 DIAGNOSIS — I639 Cerebral infarction, unspecified: Secondary | ICD-10-CM

## 2022-07-27 NOTE — Progress Notes (Signed)
Electrophysiology Office Note   Date:  07/27/2022   ID:  IMA HAFNER, DOB 04-03-1965, MRN [id number]  PCP:  [facility]  Cardiologist:  Lynnette Caffey Primary Electrophysiologist:  Franciso Dierks Jorja Loa, MD    Chief Complaint: CVA   History of Present Illness: Mary Franco is a 57 y.o. female who is being seen today for the evaluation of CVA at the request of Micki Riley, MD. Presenting today for electrophysiology evaluation.  History significant for diabetes, alcohol, and amphetamine use, hyperlipidemia, hypertension, fibromyalgia.  She woke up 02/08/2022 with heaviness and weakness as well as numbness in her right leg.  She went to the emergency room and was found to have small embolic infarct involving the left insular and parietal lobes.  She was found to have carotid stenosis at 50% bilaterally but no occlusion.  Echo showed normal ejection fraction.  She was found to have a PFO and has plans for closure.  Today, she denies symptoms of palpitations, chest pain, shortness of breath, orthopnea, PND, lower extremity edema, claudication, dizziness, presyncope, syncope, bleeding, or neurologic sequela. The patient is tolerating medications without difficulties.    Past Medical History:  Diagnosis Date   Acute bronchitis 03/07/2018   Acute hypoxemic respiratory failure (HCC) 03/07/2018   Acute thromboembolic cerebrovascular accident [facility])    Alcohol abuse    Amphetamine use    Anxiety    ASD (atrial septal defect) 07/19/2018   Asthma    Atypical squamous cells of undetermined significance on cytologic smear of cervix (ASC-US) 12/12/2019   Cervical strain 04/16/2014   Cervicogenic headache 04/16/2014   Chest pain, musculoskeletal 06/26/2019   Fell end of April 2021 > neg cxr 06/26/2019 with parasthesias > try zostrix    Chronic combined systolic and diastolic CHF (congestive heart failure) (HCC) 03/25/2018   Echo 02/2018: EF 40-45 // Echo 07/2018:  EF 55-60, trivial  AI, small secundum ASD (L>R shunting).   Chronic pain of left knee 10/09/2021   Chronic sinusitis 09/01/2018   Onset 2005   - augmentin x 10 days 08/31/18    - 12/09/2018 referred to ENT  Mary Franco eval >   - Sinus CT p course of augmentin started 05/24/2019 if not better   - Allergy profile 05/24/19  >  Eos 0.1/  IgE  2790    Cigarette smoker 03/07/2018   COPD (chronic obstructive pulmonary disease) (HCC)    COPD GOLD II/ active smoker  03/30/2018   Active smoker  - 03/30/2018   Walked RA  3  laps @  approx 245ft each @ avg pace  stopped due to end of study, mild sob and chest tight, no desats    - Spirometry 03/30/2018  FEV1 2.4  (94%)  Ratio 0.73 s physiologic curvature p > 4 h since last saba   - 03/30/2018    try trelegy sample to see if reduces need for saba    - 04/14/2018  After extensive coaching inhaler device,  effectiveness =    75% fr   CVA (cerebral vascular accident) [facility])    Essential hypertension    Fibromyalgia    GERD (gastroesophageal reflux disease)    Hyperlipidemia    Hypokalemia    Hypomagnesemia    Low grade squamous intraepithelial lesion (LGSIL) on cervicovaginal cytologic smear 09/12/2018   Migraines    NICM (nonischemic cardiomyopathy) (HCC) 03/25/2018   Other acute recurrent sinusitis 10/10/2021   Right leg weakness    Tobacco abuse    Vitamin  B12 deficiency    Vitamin D deficiency    Past Surgical History:  Procedure Laterality Date   APPENDECTOMY     BUBBLE STUDY  02/25/2022   Procedure: BUBBLE STUDY;  Surgeon: Christell Constant, MD;  Location: [facility] ENDOSCOPY;  Service: Cardiovascular;;   KNEE SURGERY Left    RIGHT/LEFT HEART CATH AND CORONARY ANGIOGRAPHY N/A 03/08/2018   Procedure: RIGHT/LEFT HEART CATH AND CORONARY ANGIOGRAPHY;  Surgeon: Runell Gess, MD;  Location: [facility] INVASIVE CV LAB;  Service: Cardiovascular;  Laterality: N/A;   TEE WITHOUT CARDIOVERSION N/A 02/25/2022   Procedure: TRANSESOPHAGEAL ECHOCARDIOGRAM (TEE);  Surgeon: Christell Constant,  MD;  Location: [facility] ENDOSCOPY;  Service: Cardiovascular;  Laterality: N/A;   TUBAL LIGATION       Current Outpatient Medications  Medication Sig Dispense Refill   acetaminophen (TYLENOL) 500 MG tablet Take 1,000 mg by mouth every 6 (six) hours as needed for moderate pain.     albuterol (VENTOLIN HFA) 108 (90 Base) MCG/ACT inhaler Inhale 2 puffs into the lungs every 6 (six) hours as needed for wheezing or shortness of breath. 8 g 3   alprazolam (XANAX) 2 MG tablet Take 2 mg by mouth at bedtime.     amLODipine (NORVASC) 5 MG tablet Take 1 tablet (5 mg total) by mouth daily. 90 tablet 0   aspirin EC 81 MG tablet Take 81 mg by mouth daily. Swallow whole.     bisoprolol (ZEBETA) 5 MG tablet Take 1 tablet (5 mg total) by mouth daily. 90 tablet 0   esomeprazole (NEXIUM) 20 MG capsule Take 40 mg by mouth daily.     Fluticasone-Umeclidin-Vilant (TRELEGY ELLIPTA) 200-62.5-25 MCG/ACT AEPB Inhale 1 puff into the lungs daily.     gabapentin (NEURONTIN) 300 MG capsule Take 1 capsule (300 mg total) by mouth at bedtime. (Patient taking differently: Take 300 mg by mouth at bedtime as needed (pain).) 90 capsule 1   losartan (COZAAR) 50 MG tablet Take 1 tablet by mouth once daily 90 tablet 0   ondansetron (ZOFRAN) 4 MG tablet Take 1 tablet (4 mg total) by mouth every 8 (eight) hours as needed for nausea or vomiting. 30 tablet 1   rosuvastatin (CRESTOR) 20 MG tablet Take 1 tablet (20 mg total) by mouth daily. 90 tablet 0   amoxicillin-clavulanate (AUGMENTIN) 875-125 MG tablet Take 1 tablet by mouth 2 (two) times daily. (Patient not taking: Reported on 07/27/2022) 20 tablet 0   fluconazole (DIFLUCAN) 150 MG tablet Take 150 mg by mouth as needed (thrush when taking antibiotic). (Patient not taking: Reported on 07/27/2022)     fluticasone (FLONASE) 50 MCG/ACT nasal spray Place 2 sprays into both nostrils daily. (Patient not taking: Reported on 07/27/2022) 16 g 6   Fluticasone-Umeclidin-Vilant (TRELEGY ELLIPTA) 100-62.5-25  MCG/ACT AEPB Take 1 puff by mouth daily. Rinse mouth afterwards (Patient not taking: Reported on 07/27/2022) 1 each 11   promethazine-dextromethorphan (PROMETHAZINE-DM) 6.25-15 MG/5ML syrup Take 5 mLs by mouth 4 (four) times daily as needed. (Patient not taking: Reported on 07/27/2022) 118 mL 0   Varenicline Tartrate, Starter, 0.5 MG X 11 & 1 MG X 42 TBPK Take 1 each by mouth daily. (Patient not taking: Reported on 07/27/2022) 1 each 0   Vitamin D, Ergocalciferol, (DRISDOL) 1.25 MG (50000 UNIT) CAPS capsule Take 1 capsule (50,000 Units total) by mouth 2 (two) times a week. (Patient not taking: Reported on 07/27/2022) 10 capsule 2   No current facility-administered medications for this visit.    Allergies:  Codeine, Sulfa antibiotics, and Spironolactone   Social History:  The patient  reports that she has been smoking cigarettes. She has a 60.00 pack-year smoking history. She has never used smokeless tobacco. She reports current alcohol use. She reports that she does not use drugs.   Family History:  The patient's family history includes Cancer in her mother; Colitis in her paternal aunt; Ovarian cancer in her maternal grandmother.    ROS:  Please see the history of present illness.   Otherwise, review of systems is positive for none.   All other systems are reviewed and negative.    PHYSICAL EXAM: VS:  BP (!) 140/90   Pulse 74   Ht 5\' 2"  (1.575 m)   Wt 160 lb (72.6 kg)   SpO2 98%   BMI 29.26 kg/m  , BMI Body mass index is 29.26 kg/m. GEN: Well nourished, well developed, in no acute distress  HEENT: normal  Neck: no JVD, carotid bruits, or masses Cardiac: RRR; no murmurs, rubs, or gallops,no edema  Respiratory:  clear to auscultation bilaterally, normal work of breathing GI: soft, nontender, nondistended, + BS MS: no deformity or atrophy  Skin: warm and dry Neuro:  Strength and sensation are intact Psych: euthymic mood, full affect  EKG:  EKG is not ordered today. Personal review  of the ekg ordered 03/30/22 shows sinus rhythm  Recent Labs: 10/28/2021: Hemoglobin 12.9; Platelets 343 04/02/2022: ALT 8 07/09/2022: BUN 20; Creatinine, Ser 1.09; Magnesium 1.3; Potassium 4.0; Sodium 139    Lipid Panel     Component Value Date/Time   CHOL 140 04/02/2022 0822   TRIG 124 04/02/2022 0822   HDL 41 04/02/2022 0822   CHOLHDL 3.4 04/02/2022 0822   LDLCALC 77 04/02/2022 0822     Wt Readings from Last 3 Encounters:  07/27/22 160 lb (72.6 kg)  06/16/22 158 lb 12.8 oz (72 kg)  03/30/22 150 lb (68 kg)      Other studies Reviewed: Additional studies/ records that were reviewed today include: TEE 02/25/22  Review of the above records today demonstrates:   1. Evidence of atrial level shunting detected by color flow Doppler.  Agitated saline contrast bubble study was positive with shunting observed  within 3-6 cardiac cycles suggestive of interatrial shunt.   2. Left ventricular ejection fraction, by estimation, is 55 to 60%. The  left ventricle has normal function.   3. Right ventricular systolic function is normal. The right ventricular  size is normal.   4. No left atrial/left atrial appendage thrombus was detected. The LAA  emptying velocity was 44 cm/s.   5. The mitral valve is normal in structure. No evidence of mitral valve  regurgitation. No evidence of mitral stenosis.   6. The aortic valve is tricuspid. Aortic valve regurgitation is not  visualized. No aortic stenosis is present.   7. There is Moderate (Grade III) plaque involving the ascending aorta and  descending aorta.     ASSESSMENT AND PLAN:  1.  Cryptogenic stroke: Thus far no causes been found.  She does have a PFO and has plans for potential closure.  Despite this, monitoring for atrial fibrillation due to her chronic medical problems would be reasonable.  Barby Colvard plan for ILR implant.  Risk and benefits have been discussed.  Risk include bleeding and infection.  She understands these risks and is agreed  to the procedure.    Current medicines are reviewed at length with the patient today.   The patient does not have concerns  regarding her medicines.  The following changes were made today:  none  Labs/ tests ordered today include:  No orders of the defined types were placed in this encounter.    Disposition:   FU with Ruthe Roemer pending ILR results  Signed, Theophile Harvie Jorja Loa, MD  07/27/2022 1:09 PM     [facility] [address] [place] [address] [phone] (office) [phone] (fax)  SURGEON:  Albertia Carvin Jorja Loa, MD     PREPROCEDURE DIAGNOSIS:  Cryptogenic stroke    POSTPROCEDURE DIAGNOSIS: Cryptogenic stroke     PROCEDURES:   1. Implantable loop recorder implantation    INTRODUCTION:  CHRISY JACKSON presents with a history of cryptogenic stroke The costs of loop recorder monitoring have been discussed with the patient. Appropriate time out was performed prior to the procedure.    DESCRIPTION OF PROCEDURE:  Informed written consent was obtained.  The patient required no sedation for the procedure today.  Mapping over the patient's chest was performed to identify the area where electrograms were most prominent for ILR recording.  This area was found to be the left parasternal region over the 4th intercostal space. The patients left chest was therefore prepped and draped in the usual sterile fashion. The skin overlying the left parasternal region was infiltrated with lidocaine for local analgesia.  A 0.5-cm incision was made over the left parasternal region over the 3rd intercostal space.  A subcutaneous ILR pocket was fashioned using a combination of sharp and blunt dissection.  A Medtronic Reveal LINQ (serial # [id number]) implantable loop recorder was then placed into the pocket  R waves were very prominent and measured 0.41mV.  Steri- Strips and a sterile dressing were then applied.  There were no early apparent complications.      CONCLUSIONS:   1. Successful implantation of a implantable loop recorder for a history of cryptogenic stroke  2. No early apparent complications.   Mikael Debell Jorja Loa, MD  07/27/2022 1:09 PM

## 2022-07-27 NOTE — Patient Instructions (Signed)
Medication Instructions:  Your physician recommends that you continue on your current medications as directed. Please refer to the Current Medication list given to you today.  Labwork: None ordered.  Testing/Procedures: None ordered.  Follow-Up:  Your physician wants you to follow-up as needed with Dr. Camnitz.      Implantable Loop Recorder Placement, Care After This sheet gives you information about how to care for yourself after your procedure. Your health care provider may also give you more specific instructions. If you have problems or questions, contact your health care provider. What can I expect after the procedure? After the procedure, it is common to have: Soreness or discomfort near the incision. Some swelling or bruising near the incision.  Follow these instructions at home: Incision care  Monitor your cardiac device site for redness, swelling, and drainage. Call the device clinic at [phone] if you experience these symptoms or fever/chills.  Keep the large square bandage on your site for 24 hours and then you may remove it yourself. Keep the steri-strips underneath in place.   You may shower after 72 hours / 3 days from your procedure with the steri-strips in place. They will usually fall off on their own, or may be removed after 10 days. Pat dry.   Avoid lotions, ointments, or perfumes over your incision until it is well-healed.  Please do not submerge in water until your site is completely healed.   Your device is MRI compatible.   Remote monitoring is used to monitor your cardiac device from home. This monitoring is scheduled every month by our office. It allows us to keep an eye on the function of your device to ensure it is working properly.  If your wound site starts to bleed apply pressure.    For help with the monitor please call [employer] Monitor Support Specialist directly at [phone].    If you have any questions/concerns please call the  device clinic at [phone].  Activity  Return to your normal activities.  General instructions Follow instructions from your health care provider about how to manage your implantable loop recorder and transmit the information. Learn how to activate a recording if this is necessary for your type of device. You may go through a metal detection gate, and you may let someone hold a metal detector over your chest. Show your ID card if needed. Do not have an MRI unless you check with your health care provider first. Take over-the-counter and prescription medicines only as told by your health care provider. Keep all follow-up visits as told by your health care provider. This is important. Contact a health care provider if: You have redness, swelling, or pain around your incision. You have a fever. You have pain that is not relieved by your pain medicine. You have triggered your device because of fainting (syncope) or because of a heartbeat that feels like it is racing, slow, fluttering, or skipping (palpitations). Get help right away if you have: Chest pain. Difficulty breathing. Summary After the procedure, it is common to have soreness or discomfort near the incision. Change your dressing as told by your health care provider. Follow instructions from your health care provider about how to manage your implantable loop recorder and transmit the information. Keep all follow-up visits as told by your health care provider. This is important. This information is not intended to replace advice given to you by your health care provider. Make sure you discuss any questions you have with your health care provider. Document   Released: 01/14/2015 Document Revised: 03/20/2017 Document Reviewed: 03/20/2017 Elsevier Patient Education  2020 Else[employer] Inc[employer]   

## 2022-07-28 ENCOUNTER — Telehealth: Payer: Self-pay

## 2022-07-28 NOTE — Telephone Encounter (Signed)
The patient states she is having some stinging pain under her breast. She also states the loop is sitting straight and not sideways.

## 2022-07-28 NOTE — Telephone Encounter (Signed)
16:30 ~ Spoke to patient who reports of stinging pain at the tip of her breast since the device was placed. Also reports the device is vertical and no longer horizontal. Patient advised pain is expected from irritation from ILR placement. She may use tylenol and ice as needed for pain. Device clinic apt make 07/29/22@ 9:20AM to assess ILR placement. Dr. Elberta Fortis in office who placed if needed.

## 2022-07-29 ENCOUNTER — Ambulatory Visit: Payer: BC Managed Care – PPO | Attending: Cardiology

## 2022-07-29 DIAGNOSIS — I639 Cerebral infarction, unspecified: Secondary | ICD-10-CM

## 2022-07-29 NOTE — Progress Notes (Signed)
Patient came in complaining of stinging pain to left breast just below loop insertion site. She reports pain is actually better today but because the device moved wants to be sure everything is okay.  Insertion site presents with appropriate healing. No signs of infection or abnormal bruising or bleeding.  It did migrate slightly but no concerns for position and scar tissue will continue to anchor in.  Dr. Elberta Fortis reviewed with patient.  Normal healing, no concerns, will continue to monitor.  Patient verbalizes understanding and feels reassured.

## 2022-08-04 ENCOUNTER — Ambulatory Visit (INDEPENDENT_AMBULATORY_CARE_PROVIDER_SITE_OTHER): Payer: BC Managed Care – PPO | Admitting: Physician Assistant

## 2022-08-04 ENCOUNTER — Encounter: Payer: Self-pay | Admitting: Physician Assistant

## 2022-08-04 VITALS — BP 122/80 | HR 75 | Temp 97.2°F | Ht 62.0 in | Wt 158.0 lb

## 2022-08-04 DIAGNOSIS — I700 Atherosclerosis of aorta: Secondary | ICD-10-CM | POA: Diagnosis not present

## 2022-08-04 DIAGNOSIS — R739 Hyperglycemia, unspecified: Secondary | ICD-10-CM

## 2022-08-04 DIAGNOSIS — J309 Allergic rhinitis, unspecified: Secondary | ICD-10-CM

## 2022-08-04 DIAGNOSIS — Q2112 Patent foramen ovale: Secondary | ICD-10-CM

## 2022-08-04 DIAGNOSIS — I6930 Unspecified sequelae of cerebral infarction: Secondary | ICD-10-CM

## 2022-08-04 DIAGNOSIS — G8929 Other chronic pain: Secondary | ICD-10-CM

## 2022-08-04 DIAGNOSIS — M25562 Pain in left knee: Secondary | ICD-10-CM

## 2022-08-04 DIAGNOSIS — J441 Chronic obstructive pulmonary disease with (acute) exacerbation: Secondary | ICD-10-CM | POA: Diagnosis not present

## 2022-08-04 DIAGNOSIS — I10 Essential (primary) hypertension: Secondary | ICD-10-CM

## 2022-08-04 DIAGNOSIS — E559 Vitamin D deficiency, unspecified: Secondary | ICD-10-CM

## 2022-08-04 DIAGNOSIS — Z720 Tobacco use: Secondary | ICD-10-CM

## 2022-08-04 HISTORY — DX: Atherosclerosis of aorta: I70.0

## 2022-08-04 HISTORY — DX: Chronic obstructive pulmonary disease with (acute) exacerbation: J44.1

## 2022-08-04 HISTORY — DX: Patent foramen ovale: Q21.12

## 2022-08-04 HISTORY — DX: Allergic rhinitis, unspecified: J30.9

## 2022-08-04 HISTORY — DX: Hyperglycemia, unspecified: R73.9

## 2022-08-04 MED ORDER — CETIRIZINE HCL 10 MG PO TABS: 10.0000 mg | ORAL_TABLET | Freq: Every day | ORAL | 3 refills | Status: AC

## 2022-08-04 MED ORDER — AZITHROMYCIN 250 MG PO TABS: ORAL_TABLET | ORAL | 0 refills | Status: AC

## 2022-08-04 NOTE — Progress Notes (Signed)
Subjective:  Patient ID: Mary Franco, female    DOB: 12-Dec-1965  Age: 57 y.o. MRN: [id number]  Chief Complaint  Patient presents with   Medical Management of Chronic Issues   PT NEW TO ME _ SHANNON PT HPI Pt with history of hypertension and history of cryptogenic stroke in 12/23.  She is currently following with cardiology.  She has known PFO and recently saw Dr Elberta Fortis and had ILR placed last week.  She is unsure when any of her follow up visits are with her providers She is currently taking norvasc 5mg , bisoprolol 5mg , and losartan 50mg  as well as ASA 81mg  every day  Pt also follows with neurology (Dr Pearlean Brownie) for history of stroke.  Her last appt was in April and pt unsure when her follow up is.  He is the provider that set up for ILR  Pt was diagnosed with aortic atherosclerosis at same time of her stroke and started on crestor 20mg  every day  Pt with history of copd/emphysema and has recently noted increased cough, sinus pressure, pnd.  Pt does continue to smoke about 3/4ppd - She currently uses ventolin prn and telegy  Pt with history of GERD - stable on Nexium  Pt with history of anxiety - takes xanax 2mg  prn  Pt is following with ortho at this time trying to be scheduled for left knee replacement due to OA, ACL tear and meniscal tear --- however pt will need clearance from all her specialists first and also advised to stop smoking     08/04/2022   10:53 AM 10/28/2021    7:45 AM 07/10/2020   10:01 AM  Depression screen PHQ 2/9  Decreased Interest 0 1 2  Down, Depressed, Hopeless 0 0 0  PHQ - 2 Score 0 1 2  Altered sleeping 2 0 3  Tired, decreased energy 3 3 1   Change in appetite 1 1 2   Feeling bad or failure about yourself   0 0  Trouble concentrating 0 0 0  Moving slowly or fidgety/restless 0 0 0  Suicidal thoughts 0 0 0  PHQ-9 Score 6 5 8   Difficult doing work/chores Somewhat difficult Somewhat difficult Not difficult at all        04/19/2018    2:01 PM  07/10/2020   10:01 AM 10/28/2021    7:44 AM 02/25/2022   10:59 AM 08/04/2022   10:53 AM  Fall Risk  Falls in the past year?  0 0  0  Was there an injury with Fall?  0 0  0  Fall Risk Category Calculator  0 0  0  Fall Risk Category (Retired)  Low Low    (RETIRED) Patient Fall Risk Level Low fall risk Low fall risk Low fall risk Low fall risk   Patient at Risk for Falls Due to  No Fall Risks No Fall Risks  No Fall Risks  Fall risk Follow up  Falls evaluation completed Falls evaluation completed  Falls evaluation completed     ROS CONSTITUTIONAL: Negative for chills, fatigue, fever, unintentional weight gain and unintentional weight loss.  E/N/T: see HPI CARDIOVASCULAR: Negative for chest pain, dizziness, palpitations and pedal edema.  RESPIRATORY: see HPI GASTROINTESTINAL: Negative for abdominal pain, acid reflux symptoms, constipation, diarrhea, nausea and vomiting.  MSK: see HPI INTEGUMENTARY: Negative for rash.  NEUROLOGICAL: Negative for dizziness and headaches.  PSYCHIATRIC: Negative for sleep disturbance and to question depression screen.  Negative for depression, negative for anhedonia.    Current Outpatient  Medications:    acetaminophen (TYLENOL) 500 MG tablet, Take 1,000 mg by mouth every 6 (six) hours as needed for moderate pain., Disp: , Rfl:    albuterol (VENTOLIN HFA) 108 (90 Base) MCG/ACT inhaler, Inhale 2 puffs into the lungs every 6 (six) hours as needed for wheezing or shortness of breath., Disp: 8 g, Rfl: 3   alprazolam (XANAX) 2 MG tablet, Take 2 mg by mouth at bedtime., Disp: , Rfl:    amLODipine (NORVASC) 5 MG tablet, Take 1 tablet (5 mg total) by mouth daily., Disp: 90 tablet, Rfl: 0   aspirin EC 81 MG tablet, Take 81 mg by mouth daily. Swallow whole., Disp: , Rfl:    azithromycin (ZITHROMAX) 250 MG tablet, Take 2 tablets on day 1, then 1 tablet daily on days 2 through 5, Disp: 6 tablet, Rfl: 0   bisoprolol (ZEBETA) 5 MG tablet, Take 1 tablet (5 mg total) by mouth  daily., Disp: 90 tablet, Rfl: 0   cetirizine (ZYRTEC ALLERGY) 10 MG tablet, Take 1 tablet (10 mg total) by mouth daily., Disp: 30 tablet, Rfl: 3   esomeprazole (NEXIUM) 20 MG capsule, Take 40 mg by mouth daily., Disp: , Rfl:    Fluticasone-Umeclidin-Vilant (TRELEGY ELLIPTA) 200-62.5-25 MCG/ACT AEPB, Inhale 1 puff into the lungs daily., Disp: , Rfl:    gabapentin (NEURONTIN) 300 MG capsule, Take 1 capsule (300 mg total) by mouth at bedtime. (Patient taking differently: Take 300 mg by mouth at bedtime as needed (pain).), Disp: 90 capsule, Rfl: 1   losartan (COZAAR) 50 MG tablet, Take 1 tablet by mouth once daily, Disp: 90 tablet, Rfl: 0   ondansetron (ZOFRAN) 4 MG tablet, Take 1 tablet (4 mg total) by mouth every 8 (eight) hours as needed for nausea or vomiting., Disp: 30 tablet, Rfl: 1   rosuvastatin (CRESTOR) 20 MG tablet, Take 1 tablet (20 mg total) by mouth daily., Disp: 90 tablet, Rfl: 0  Past Medical History:  Diagnosis Date   Acute bronchitis 03/07/2018   Acute hypoxemic respiratory failure (HCC) 03/07/2018   Acute thromboembolic cerebrovascular accident [facility])    Alcohol abuse    Amphetamine use    Anxiety    ASD (atrial septal defect) 07/19/2018   Asthma    Atypical squamous cells of undetermined significance on cytologic smear of cervix (ASC-US) 12/12/2019   Cervical strain 04/16/2014   Cervicogenic headache 04/16/2014   Chest pain, musculoskeletal 06/26/2019   Fell end of April 2021 > neg cxr 06/26/2019 with parasthesias > try zostrix    Chronic combined systolic and diastolic CHF (congestive heart failure) (HCC) 03/25/2018   Echo 02/2018: EF 40-45 // Echo 07/2018:  EF 55-60, trivial AI, small secundum ASD (L>R shunting).   Chronic pain of left knee 10/09/2021   Chronic sinusitis 09/01/2018   Onset 2005   - augmentin x 10 days 08/31/18    - 12/09/2018 referred to ENT  [place] eval >   - Sinus CT p course of augmentin started 05/24/2019 if not better   - Allergy profile 05/24/19  >  Eos 0.1/   IgE  2790    Cigarette smoker 03/07/2018   COPD (chronic obstructive pulmonary disease) (HCC)    COPD GOLD II/ active smoker  03/30/2018   Active smoker  - 03/30/2018   Walked RA  3  laps @  approx 223ft each @ avg pace  stopped due to end of study, mild sob and chest tight, no desats    - Spirometry 03/30/2018  FEV1 2.4  (  94%)  Ratio 0.73 s physiologic curvature p > 4 h since last saba   - 03/30/2018    try trelegy sample to see if reduces need for saba    - 04/14/2018  After extensive coaching inhaler device,  effectiveness =    75% fr   CVA (cerebral vascular accident) [facility])    Essential hypertension    Fibromyalgia    GERD (gastroesophageal reflux disease)    Hyperlipidemia    Hypokalemia    Hypomagnesemia    Low grade squamous intraepithelial lesion (LGSIL) on cervicovaginal cytologic smear 09/12/2018   Migraines    NICM (nonischemic cardiomyopathy) (HCC) 03/25/2018   Other acute recurrent sinusitis 10/10/2021   Right leg weakness    Tobacco abuse    Vitamin B12 deficiency    Vitamin D deficiency    Objective:  PHYSICAL EXAM:   BP 122/80 (BP Location: Left Arm, Patient Position: Sitting, Cuff Size: Normal)   Pulse 75   Temp (!) 97.2 F (36.2 C) (Temporal)   Ht 5\' 2"  (1.575 m)   Wt 158 lb (71.7 kg)   SpO2 97%   BMI 28.90 kg/m    GEN: Well nourished, well developed, in no acute distress  HEENT: normal external ears and nose - normal external auditory canals and TMS -  - Lips, Teeth and Gums - normal  Oropharynx - mild erythema/pnd Neck: no JVD or masses - no thyromegaly Cardiac: RRR; no murmurs, rubs, or gallops,no edema -  Respiratory:  faint scattered rhonchi GI: normal bowel sounds, no masses or tenderness Skin: warm and dry, no rash  Psych: euthymic mood, appropriate affect and demeanor  Assessment & Plan:    Essential hypertension -     CBC with Differential/Platelet -     Comprehensive metabolic panel -     TSH Continue current meds History of CVA with  residual deficit Follow up with cardiology and neurology as directed Aortic atherosclerosis (HCC) -     Lipid panel Continue crestor COPD with acute exacerbation (HCC) -     Azithromycin; Take 2 tablets on day 1, then 1 tablet daily on days 2 through 5  Dispense: 6 tablet; Refill: 0 Continue albuterol and trelegy Recommend stop smoking Chronic allergic rhinitis -     Cetirizine HCl; Take 1 tablet (10 mg total) by mouth daily.  Dispense: 30 tablet; Refill: 3  Vitamin D deficiency -     VITAMIN D 25 Hydroxy (Vit-D Deficiency, Fractures)  Tobacco abuse Recommend stop smoking Hyperglycemia -     Hemoglobin A1c  PFO (patent foramen ovale) Follow up with cardiology as directed  CHRONIC left knee pain Follow up with ortho as scheduled    Follow-up: Return in about 4 months (around 12/04/2022) for chronic fasting follow-up.  An After Visit Summary was printed and given to the patient.  Jettie Pagan [facility] [phone]

## 2022-08-05 ENCOUNTER — Other Ambulatory Visit: Payer: Self-pay | Admitting: Physician Assistant

## 2022-08-05 DIAGNOSIS — R899 Unspecified abnormal finding in specimens from other organs, systems and tissues: Secondary | ICD-10-CM

## 2022-08-05 LAB — COMPREHENSIVE METABOLIC PANEL
ALT: 10 IU/L (ref 0–32)
AST: 16 IU/L (ref 0–40)
Albumin: 3.9 g/dL (ref 3.8–4.9)
Alkaline Phosphatase: 89 IU/L (ref 44–121)
BUN/Creatinine Ratio: 7 — ABNORMAL LOW (ref 9–23)
BUN: 6 mg/dL (ref 6–24)
Bilirubin Total: 0.3 mg/dL (ref 0.0–1.2)
CO2: 25 mmol/L (ref 20–29)
Calcium: 8.4 mg/dL — ABNORMAL LOW (ref 8.7–10.2)
Chloride: 99 mmol/L (ref 96–106)
Creatinine, Ser: 0.86 mg/dL (ref 0.57–1.00)
Globulin, Total: 2.1 g/dL (ref 1.5–4.5)
Glucose: 128 mg/dL — ABNORMAL HIGH (ref 70–99)
Potassium: 3.2 mmol/L — ABNORMAL LOW (ref 3.5–5.2)
Sodium: 145 mmol/L — ABNORMAL HIGH (ref 134–144)
Total Protein: 6 g/dL (ref 6.0–8.5)
eGFR: 78 mL/min/{1.73_m2} (ref >59)

## 2022-08-05 LAB — HEMOGLOBIN A1C
Est. average glucose Bld gHb Est-mCnc: 134 mg/dL
Hgb A1c MFr Bld: 6.3 % — ABNORMAL HIGH (ref 4.8–5.6)

## 2022-08-05 LAB — CBC WITH DIFFERENTIAL/PLATELET
Basophils Absolute: 0 10*3/uL (ref 0.0–0.2)
Basos: 0 %
EOS (ABSOLUTE): 0.1 10*3/uL (ref 0.0–0.4)
Eos: 1 %
Hematocrit: 39 % (ref 34.0–46.6)
Hemoglobin: 12.3 g/dL (ref 11.1–15.9)
Immature Grans (Abs): 0 10*3/uL (ref 0.0–0.1)
Immature Granulocytes: 0 %
Lymphocytes Absolute: 2 10*3/uL (ref 0.7–3.1)
Lymphs: 26 %
MCH: 29.4 pg (ref 26.6–33.0)
MCHC: 31.5 g/dL (ref 31.5–35.7)
MCV: 93 fL (ref 79–97)
Monocytes Absolute: 0.7 10*3/uL (ref 0.1–0.9)
Monocytes: 9 %
Neutrophils Absolute: 4.9 10*3/uL (ref 1.4–7.0)
Neutrophils: 64 %
Platelets: 390 10*3/uL (ref 150–450)
RBC: 4.18 x10E6/uL (ref 3.77–5.28)
RDW: 13.3 % (ref 11.7–15.4)
WBC: 7.6 10*3/uL (ref 3.4–10.8)

## 2022-08-05 LAB — LIPID PANEL
Chol/HDL Ratio: 3.3 ratio (ref 0.0–4.4)
Cholesterol, Total: 143 mg/dL (ref 100–199)
HDL: 44 mg/dL (ref >39)
LDL Chol Calc (NIH): 71 mg/dL (ref 0–99)
Triglycerides: 166 mg/dL — ABNORMAL HIGH (ref 0–149)
VLDL Cholesterol Cal: 28 mg/dL (ref 5–40)

## 2022-08-05 LAB — VITAMIN D 25 HYDROXY (VIT D DEFICIENCY, FRACTURES): Vit D, 25-Hydroxy: 40.5 ng/mL (ref 30.0–100.0)

## 2022-08-05 LAB — TSH: TSH: 0.731 u[IU]/mL (ref 0.450–4.500)

## 2022-08-06 ENCOUNTER — Telehealth: Payer: Self-pay

## 2022-08-06 DIAGNOSIS — Z01818 Encounter for other preprocedural examination: Secondary | ICD-10-CM | POA: Diagnosis not present

## 2022-08-06 DIAGNOSIS — E559 Vitamin D deficiency, unspecified: Secondary | ICD-10-CM | POA: Diagnosis not present

## 2022-08-06 DIAGNOSIS — M79609 Pain in unspecified limb: Secondary | ICD-10-CM | POA: Diagnosis not present

## 2022-08-06 DIAGNOSIS — Z79899 Other long term (current) drug therapy: Secondary | ICD-10-CM | POA: Diagnosis not present

## 2022-08-06 LAB — LAB REPORT - SCANNED: EGFR: 60

## 2022-08-06 NOTE — Telephone Encounter (Signed)
Patient called requesting her surgical clearance be faxed over and stated she needed [employer] papers filled out for disability. Patient did speak with Haze Boyden, information has been given to provider.

## 2022-08-06 NOTE — Telephone Encounter (Signed)
I called Mary Franco and they will be faxing over paperwork, stated it will take up to 24 hours before office received information.

## 2022-08-06 NOTE — Progress Notes (Signed)
Cardiology Office Note:    Date:  08/07/2022   ID:  Mary Franco, DOB 30-Dec-1965, MRN [id number]  PCP:  Mary Sofia, PA-C   [facility] Providers Cardiologist:  Alverda Skeans, MD Referring MD: No ref. provider found   Chief Complaint/Reason for Referral: Intra-atrial communication with a history of stroke  ASSESSMENT:    1. Cerebrovascular accident (CVA), unspecified mechanism (HCC)   2. Essential hypertension   3. Hyperlipidemia LDL goal <70   4. Aortic atherosclerosis (HCC)   5. Preoperative cardiovascular examination      PLAN:    In order of problems listed above: 1.  History of stroke: ROPE score 5 and TCD positive with Valsalva.  I think it was a very good idea to place a Linq monitor for longer duration assessment for occult atrial fibrillation.  I will see the patient back in 3 months with a Linq monitor assessment on that day.  Following this I will reach out to neurology regarding treatment plan.   2.  Hypertension:  Blood pressure is well controlled 3.  Hyperlipidemia: Increase Crestor to 40mg ; goal LDL < 55 given history of stroke.  Check lipid panel, LFTs in 2 months. 4.  Aortic atherosclerosis: Continue aspirin, Crestor, and strict blood pressure control. 5.  Preoperative cardiovascular examination: Will obtain a Lexiscan stress test to evaluate for high risk ischemia in preparation for orthopedic surgery.             Dispo:  Return in about 3 months (around 11/07/2022).      Medication Adjustments/Labs and Tests Ordered: Current medicines are reviewed at length with the patient today.  Concerns regarding medicines are outlined above.  The following changes have been made:  no change   Labs/tests ordered: Orders Placed This Encounter  Procedures   Lipid panel   Hepatic function panel   Cardiac Stress Test: Informed Consent Details: Physician/Practitioner Attestation; Transcribe to consent form and obtain patient signature   MYOCARDIAL  PERFUSION IMAGING   EKG 12-Lead    Medication Changes: Meds ordered this encounter  Medications   rosuvastatin (CRESTOR) 40 MG tablet    Sig: Take 1 tablet (40 mg total) by mouth daily.    Dispense:  90 tablet    Refill:  3    Dose increase     Current medicines are reviewed at length with the patient today.  The patient does not have concerns regarding medicines.   History of Present Illness:    FOCUSED PROBLEM LIST:   1.  Longstanding tobacco abuse with COPD 2.  Acute stroke December 2024 [facility]; CTA demonstrated no large vessel occlusion with moderate atherosclerosis of the carotid bifurcations without hemodynamically significant stenoses with 50% stenosis of the left subclavian.  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  Aortic atherosclerosis seen on TEE January 2024  February 2024:  The patient is a 57 y.o. female with the indicated medical history here for recommendations regarding history of stroke with a TEE demonstrating an atrial level communication.  The patient was admitted to [facility] with acute right lower extremity weakness in late December.  She was diagnosed with an acute stroke and underwent an evaluation including a CTA and MRI as detailed above.  An echocardiogram demonstrated preserved LV function with  no ventricular thrombus and a bubble study was not performed.  She was discharged on dual antiplatelet therapy and atorvastatin.  The patient has been well since discharge.  She is still out of work and scheduled to return to work on the 25th.  She denies any signs or symptoms of recurrent stroke.  She denies any palpitations, paroxysmal nocturnal dyspnea, orthopnea.  She denies any  chest pain.  She has had no visual disturbances or recurrent leg pain.  She denies any slurred speech.  She has had no severe bleeding or bruising while on dual antiplatelet therapy.  Today:  In the interim, the patient was seen by neurology and plavix was stopped.  She was referred to EP for consideration of a Linq monitor.  A transcranial doppler was positive for a medium sized right to left shunt with Valsalva.  She denies any recurrent signs or symptoms of stroke.  She does have some residual right lower extremity paresthesias and weakness but nothing new or different from before.  She is having quite a bit of difficulty ambulating due to left knee pain and she was seen by an orthopedic surgeon with plans for surgery.  She denies any chest pain, palpitations, paroxysmal nocturnal dyspnea, orthopnea.  She is not able to do a whole lot of walking due to left knee instability.  She fortunately has not required any emergency room visits or hospitalizations.         Current Medications: Current Meds  Medication Sig   acetaminophen (TYLENOL) 500 MG tablet Take 1,000 mg by mouth every 6 (six) hours as needed for moderate pain.   albuterol (VENTOLIN HFA) 108 (90 Base) MCG/ACT inhaler Inhale 2 puffs into the lungs every 6 (six) hours as needed for wheezing or shortness of breath.   alprazolam (XANAX) 2 MG tablet Take 2 mg by mouth at bedtime.   amLODipine (NORVASC) 5 MG tablet Take 1 tablet (5 mg total) by mouth daily.   aspirin EC 81 MG tablet Take 81 mg by mouth daily. Swallow whole.   azithromycin (ZITHROMAX) 250 MG tablet Take 2 tablets on day 1, then 1 tablet daily on days 2 through 5   bisoprolol (ZEBETA) 5 MG tablet Take 1 tablet (5 mg total) by mouth daily.   cetirizine (ZYRTEC ALLERGY) 10 MG tablet Take 1 tablet (10 mg total) by mouth daily.   esomeprazole (NEXIUM) 20 MG capsule Take 40 mg by mouth daily.   Fluticasone-Umeclidin-Vilant (TRELEGY ELLIPTA) 100-62.5-25 MCG/ACT AEPB Inhale into  the lungs daily as needed.   gabapentin (NEURONTIN) 300 MG capsule Take 1 capsule (300 mg total) by mouth at bedtime. (Patient taking differently: Take 300 mg by mouth at bedtime as needed (pain).)   losartan (COZAAR) 50 MG tablet Take 1 tablet by mouth once daily   ondansetron (ZOFRAN) 4 MG tablet Take 1 tablet (4 mg total) by mouth every 8 (eight) hours as needed for nausea or vomiting.   rosuvastatin (CRESTOR) 40 MG tablet Take 1 tablet (40 mg total) by mouth daily.   [DISCONTINUED] rosuvastatin (CRESTOR) 20 MG tablet Take 1 tablet (20 mg total) by mouth daily.     Allergies:    Codeine, Sulfa antibiotics, and Spironolactone   Social History:   Social History   Tobacco Use   Smoking status: Every Day    Packs/day: 1.50    Years: 40.00    Additional pack years: 0.00    Total pack years: 60.00    Types: Cigarettes   Smokeless tobacco:  Never  Vaping Use   Vaping Use: Never used  Substance Use Topics   Alcohol use: Yes    Comment: 2 x per week   Drug use: No     Family Hx: Family History  Problem Relation Age of Onset   Cancer Mother    Colitis Paternal Aunt    Ovarian cancer Maternal Grandmother      Review of Systems:   Please see the history of present illness.    All other systems reviewed and are negative.     EKGs/Labs/Other Test Reviewed:    EKG:  EKG performed today that I personally reviewed demonstrates sinus rhythm  Prior CV studies:  TCD 2024: -No HITS (high intensity transient signals) heard at rest. Partial curtain  during valsalva indicating a Spencer Grade 4 PFO (patent foramen ovale).   -Positive TCD Bubble study indicative of a medium size right to left shunt  with valasalva    TEE 2024:  1. Evidence of atrial level shunting detected by color flow Doppler.  Agitated saline contrast bubble study was positive with shunting observed  within 3-6 cardiac cycles suggestive of interatrial shunt.   2. Left ventricular ejection fraction, by  estimation, is 55 to 60%. The  left ventricle has normal function.   3. Right ventricular systolic function is normal. The right ventricular  size is normal.   4. No left atrial/left atrial appendage thrombus was detected. The LAA  emptying velocity was 44 cm/s.   5. The mitral valve is normal in structure. No evidence of mitral valve  regurgitation. No evidence of mitral stenosis.   6. The aortic valve is tricuspid. Aortic valve regurgitation is not  visualized. No aortic stenosis is present.   7. There is Moderate (Grade III) plaque involving the ascending aorta and  descending aorta.   Coronary angiography 2020 with no obstructive coronary artery disease  Other studies Reviewed: Review of the additional studies/records demonstrates: CT angiography 2024 with no large vessel occlusion with mild to moderate carotid bifurcation disease and left subclavian disease.  Recent Labs: 07/09/2022: Magnesium 1.3 08/04/2022: ALT 10; BUN 6; Creatinine, Ser 0.86; Hemoglobin 12.3; Platelets 390; Potassium 3.2; Sodium 145; TSH 0.731   Recent Lipid Panel Lab Results  Component Value Date/Time   CHOL 143 08/04/2022 11:30 AM   TRIG 166 (H) 08/04/2022 11:30 AM   HDL 44 08/04/2022 11:30 AM   LDLCALC 71 08/04/2022 11:30 AM    Risk Assessment/Calculations:                Physical Exam:    VS:  BP 130/75   Pulse 74   Ht 5' 2.5" (1.588 m)   Wt 160 lb (72.6 kg)   BMI 28.80 kg/m    Wt Readings from Last 3 Encounters:  08/07/22 160 lb (72.6 kg)  08/04/22 158 lb (71.7 kg)  07/27/22 160 lb (72.6 kg)    GENERAL:  No apparent distress, AOx3 HEENT:  No carotid bruits, +2 carotid impulses, no scleral icterus CAR: RRR no murmurs, gallops, rubs, or thrills RES:  Clear to auscultation bilaterally ABD:  Soft, nontender, nondistended, positive bowel sounds x 4 VASC:  +2 radial pulses, +2 carotid pulses, palpable pedal pulses NEURO:  CN 2-12 grossly intact; motor and sensory grossly intact PSYCH:   No active depression or anxiety EXT:  No edema, ecchymosis, or cyanosis  Signed, Orbie Pyo, MD  08/07/2022 10:48 AM    [facility] [address] [place], [place], [place]  [id number] Phone: [phone]; Fax: [phone]   Note:  This document was prepared using Dragon voice recognition software and may include unintentional dictation errors.

## 2022-08-07 ENCOUNTER — Telehealth (HOSPITAL_COMMUNITY): Payer: Self-pay | Admitting: *Deleted

## 2022-08-07 ENCOUNTER — Encounter: Payer: Self-pay | Admitting: *Deleted

## 2022-08-07 ENCOUNTER — Encounter (HOSPITAL_COMMUNITY): Payer: Self-pay | Admitting: *Deleted

## 2022-08-07 ENCOUNTER — Ambulatory Visit: Payer: BC Managed Care – PPO | Attending: Internal Medicine | Admitting: Internal Medicine

## 2022-08-07 ENCOUNTER — Encounter: Payer: Self-pay | Admitting: Internal Medicine

## 2022-08-07 ENCOUNTER — Telehealth: Payer: Self-pay | Admitting: Internal Medicine

## 2022-08-07 VITALS — BP 130/75 | HR 74 | Ht 62.5 in | Wt 160.0 lb

## 2022-08-07 DIAGNOSIS — E785 Hyperlipidemia, unspecified: Secondary | ICD-10-CM

## 2022-08-07 DIAGNOSIS — I700 Atherosclerosis of aorta: Secondary | ICD-10-CM | POA: Diagnosis not present

## 2022-08-07 DIAGNOSIS — Z01810 Encounter for preprocedural cardiovascular examination: Secondary | ICD-10-CM | POA: Diagnosis not present

## 2022-08-07 DIAGNOSIS — I639 Cerebral infarction, unspecified: Secondary | ICD-10-CM | POA: Diagnosis not present

## 2022-08-07 DIAGNOSIS — I10 Essential (primary) hypertension: Secondary | ICD-10-CM | POA: Diagnosis not present

## 2022-08-07 MED ORDER — ROSUVASTATIN CALCIUM 40 MG PO TABS: 40.0000 mg | ORAL_TABLET | Freq: Every day | ORAL | 3 refills | Status: DC

## 2022-08-07 NOTE — Patient Instructions (Signed)
Medication Instructions:  Your physician has recommended you make the following change in your medication:  1.) INCREASE ROSUVASTATIN (CRESTOR) TO 40 MG DAILY  *If you need a refill on your cardiac medications before your next appointment, please call your pharmacy*   Lab Work: PLEASE RETURN IN 8 WEEKS FOR LAB WORK (LIPIDS/LIVER)   Testing/Procedures: Your physician has requested that you have a lexiscan myoview. For further information please visit [email or web address] Please follow instruction sheet, as given.   Follow-Up: At [facility], you and your health needs are our priority.  As part of our continuing mission to provide you with exceptional heart care, we have created designated Provider Care Teams.  These Care Teams include your primary Cardiologist (physician) and Advanced Practice Providers (APPs -  Physician Assistants and Nurse Practitioners) who all work together to provide you with the care you need, when you need it.   Your next appointment:   3 month(s)  Provider:   Alverda Skeans, MD  Other Instructions LINQ INTERROGATION SAME DAY

## 2022-08-07 NOTE — Telephone Encounter (Signed)
Patient states she was returning call. Please advise  ?

## 2022-08-07 NOTE — Telephone Encounter (Signed)
Sent My Chart letter outlining instructions for upcoming stress test on 08/11/22 at 10:45.

## 2022-08-11 ENCOUNTER — Ambulatory Visit (HOSPITAL_COMMUNITY): Payer: BC Managed Care – PPO | Attending: Cardiology

## 2022-08-11 DIAGNOSIS — Z01810 Encounter for preprocedural cardiovascular examination: Secondary | ICD-10-CM | POA: Diagnosis not present

## 2022-08-11 LAB — MYOCARDIAL PERFUSION IMAGING
Estimated workload: 1
Exercise duration (min): 1 min
Exercise duration (sec): 0 s
LV dias vol: 73 mL (ref 46–106)
LV sys vol: 24 mL
MPHR: 162 {beats}/min
Nuc Stress EF: 68 %
Peak HR: 78 {beats}/min
Percent HR: 48 %
Rest HR: 68 {beats}/min
Rest Nuclear Isotope Dose: 9.8 mCi
SDS: 0
SRS: 0
SSS: 0
ST Depression (mm): 0 mm
Stress Nuclear Isotope Dose: 32.6 mCi
TID: 0.95

## 2022-08-11 MED ORDER — TECHNETIUM TC 99M TETROFOSMIN IV KIT
9.8000 | PACK | Freq: Once | INTRAVENOUS | Status: AC | PRN
  Administered 2022-08-11: 9.8 via INTRAVENOUS

## 2022-08-11 MED ORDER — REGADENOSON 0.4 MG/5ML IV SOLN
0.4000 mg | Freq: Once | INTRAVENOUS | Status: AC
Start: 2022-08-11 — End: 2022-08-11
  Administered 2022-08-11: 0.4 mg via INTRAVENOUS

## 2022-08-11 MED ORDER — TECHNETIUM TC 99M TETROFOSMIN IV KIT
32.6000 | PACK | Freq: Once | INTRAVENOUS | Status: AC | PRN
  Administered 2022-08-11: 32.6 via INTRAVENOUS

## 2022-08-13 ENCOUNTER — Other Ambulatory Visit: Payer: BC Managed Care – PPO

## 2022-08-13 DIAGNOSIS — R899 Unspecified abnormal finding in specimens from other organs, systems and tissues: Secondary | ICD-10-CM

## 2022-08-14 LAB — COMPREHENSIVE METABOLIC PANEL
ALT: 11 IU/L (ref 0–32)
AST: 18 IU/L (ref 0–40)
Albumin: 4.2 g/dL (ref 3.8–4.9)
Alkaline Phosphatase: 101 IU/L (ref 44–121)
BUN/Creatinine Ratio: 12 (ref 9–23)
BUN: 10 mg/dL (ref 6–24)
Bilirubin Total: 0.4 mg/dL (ref 0.0–1.2)
CO2: 26 mmol/L (ref 20–29)
Calcium: 9.2 mg/dL (ref 8.7–10.2)
Chloride: 100 mmol/L (ref 96–106)
Creatinine, Ser: 0.82 mg/dL (ref 0.57–1.00)
Globulin, Total: 2.5 g/dL (ref 1.5–4.5)
Glucose: 102 mg/dL — ABNORMAL HIGH (ref 70–99)
Potassium: 3.8 mmol/L (ref 3.5–5.2)
Sodium: 141 mmol/L (ref 134–144)
Total Protein: 6.7 g/dL (ref 6.0–8.5)
eGFR: 83 mL/min/{1.73_m2} (ref >59)

## 2022-08-28 ENCOUNTER — Telehealth: Payer: Self-pay

## 2022-08-28 NOTE — Telephone Encounter (Signed)
Patient came in office today with concern of our office not being able to fax over her last office visit to her [employer] company from her job.  Patient made aware that the last visit was sent to them on 08/19/2022, and provider is not able to fill form out due to she is under another provider care for her reasoning of FMLA and her orthopedic should be filling out this paperwork.

## 2022-08-31 ENCOUNTER — Ambulatory Visit: Payer: BC Managed Care – PPO

## 2022-08-31 DIAGNOSIS — I700 Atherosclerosis of aorta: Secondary | ICD-10-CM | POA: Diagnosis not present

## 2022-09-02 LAB — CUP PACEART REMOTE DEVICE CHECK
Date Time Interrogation Session: 20240714190613
Implantable Pulse Generator Implant Date: 20240610

## 2022-09-14 NOTE — Progress Notes (Signed)
Carelink Summary Report / Loop Recorder 

## 2022-09-16 DIAGNOSIS — M79609 Pain in unspecified limb: Secondary | ICD-10-CM | POA: Diagnosis not present

## 2022-09-16 DIAGNOSIS — Z79899 Other long term (current) drug therapy: Secondary | ICD-10-CM | POA: Diagnosis not present

## 2022-09-16 DIAGNOSIS — R52 Pain, unspecified: Secondary | ICD-10-CM | POA: Diagnosis not present

## 2022-09-17 ENCOUNTER — Telehealth: Payer: Self-pay | Admitting: *Deleted

## 2022-09-17 NOTE — Telephone Encounter (Signed)
   Pre-operative Risk Assessment    Patient Name: Mary Franco  DOB: 02/17/1966 MRN: [id number]      Request for Surgical Clearance    Procedure:   LEFT TOTAL KNEE ARTHROPLASTY  Date of Surgery:  Clearance TBD                                 Surgeon:  DR. Linda Hedges Surgeon's Group or Practice Name:  [facility] Phone number:  [phone] Fax number:  [phone]   Type of Clearance Requested:   - Medical ;ASA   Type of Anesthesia:  General    Additional requests/questions:    Elpidio Anis   09/17/2022, 10:35 AM

## 2022-09-17 NOTE — Telephone Encounter (Signed)
   Name: Mary Franco  DOB: Jul 06, 1965  MRN: [id number]   Primary Cardiologist: Kristeen Miss, MD  Chart reviewed as part of pre-operative protocol coverage. Mary Franco was last seen on 08/07/2022 by Dr. Lynnette Caffey.  She underwent nuclear stress testing for further risk stratification. Lexiscan performed on 08/11/2022 was a low risk study. Per Dr. Lynnette Caffey "stress test did not show significant abnormalities that would require further evaluation prior to orthopedic surgery."  Therefore, based on ACC/AHA guidelines, the patient would be at acceptable risk for the planned procedure without further cardiovascular testing.   Ideally aspirin should be continued without interruption, however if the bleeding risk is too great, aspirin may be held for 5-7 days prior to surgery. Please resume aspirin post operatively when it is felt to be safe from a bleeding standpoint.    I will route this recommendation to the requesting party via Epic fax function and remove from pre-op pool. Please call with questions.  Carlos Levering, NP 09/17/2022, 1:34 PM

## 2022-09-21 ENCOUNTER — Ambulatory Visit: Payer: BC Managed Care – PPO | Admitting: Physician Assistant

## 2022-09-21 ENCOUNTER — Encounter: Payer: Self-pay | Admitting: Physician Assistant

## 2022-09-21 VITALS — BP 110/70 | HR 82 | Temp 97.5°F | Ht 63.0 in | Wt 162.6 lb

## 2022-09-21 DIAGNOSIS — I10 Essential (primary) hypertension: Secondary | ICD-10-CM

## 2022-09-21 DIAGNOSIS — G8929 Other chronic pain: Secondary | ICD-10-CM

## 2022-09-21 DIAGNOSIS — M25562 Pain in left knee: Secondary | ICD-10-CM | POA: Diagnosis not present

## 2022-09-21 DIAGNOSIS — Z22322 Carrier or suspected carrier of Methicillin resistant Staphylococcus aureus: Secondary | ICD-10-CM | POA: Insufficient documentation

## 2022-09-21 DIAGNOSIS — M2352 Chronic instability of knee, left knee: Secondary | ICD-10-CM | POA: Diagnosis not present

## 2022-09-21 HISTORY — DX: Chronic instability of knee, left knee: M23.52

## 2022-09-21 HISTORY — DX: Carrier or suspected carrier of methicillin resistant Staphylococcus aureus: Z22.322

## 2022-09-21 MED ORDER — MUPIROCIN 2 % EX OINT: 1.0000 | TOPICAL_OINTMENT | Freq: Two times a day (BID) | CUTANEOUS | 0 refills | Status: AC

## 2022-09-21 NOTE — Progress Notes (Signed)
Subjective:  Patient ID: Mary Franco, female    DOB: Mar 20, 1965  Age: 57 y.o. MRN: [id number]  Chief Complaint  Patient presents with   surgery clearance    HPI Pt in for surgical clearance for left total knee replacement by Laddie Aquas - she has known tear of medial meniscus and complete tear of ACL with tricompartmental osteoarthritis She has been cleared by her cardiologist as well as her neurologist Of note her med list has not been updated by ortho - will send list to update Also pt had preop testing in June at the hospital ordered by ortho - when reviewed it is noted that she tested positive for MRSA on nasal swab - per patient she has not been treated     09/21/2022   11:27 AM 08/04/2022   10:53 AM 10/28/2021    7:45 AM 07/10/2020   10:01 AM  Depression screen PHQ 2/9  Decreased Interest 0 0 1 2  Down, Depressed, Hopeless 0 0 0 0  PHQ - 2 Score 0 0 1 2  Altered sleeping 0 2 0 3  Tired, decreased energy 0 3 3 1   Change in appetite 0 1 1 2   Feeling bad or failure about yourself  0  0 0  Trouble concentrating 0 0 0 0  Moving slowly or fidgety/restless 0 0 0 0  Suicidal thoughts 0 0 0 0  PHQ-9 Score 0 6 5 8   Difficult doing work/chores Not difficult at all Somewhat difficult Somewhat difficult Not difficult at all        07/10/2020   10:01 AM 10/28/2021    7:44 AM 02/25/2022   10:59 AM 08/04/2022   10:53 AM 09/21/2022   11:27 AM  Fall Risk  Falls in the past year? 0 0  0 0  Was there an injury with Fall? 0 0  0 0  Fall Risk Category Calculator 0 0  0 0  Fall Risk Category (Retired) Low Low     (RETIRED) Patient Fall Risk Level Low fall risk Low fall risk Low fall risk    Patient at Risk for Falls Due to No Fall Risks No Fall Risks  No Fall Risks No Fall Risks  Fall risk Follow up Falls evaluation completed Falls evaluation completed  Falls evaluation completed Falls evaluation completed     ROS CONSTITUTIONAL: Negative for chills, fatigue, fever, unintentional  weight gain and unintentional weight loss.  E/N/T: Negative for ear pain, nasal congestion and sore throat.  CARDIOVASCULAR: Negative for chest pain, dizziness, palpitations and pedal edema.  RESPIRATORY: Negative for recent cough and dyspnea.  GASTROINTESTINAL: Negative for abdominal pain, acid reflux symptoms, constipation, diarrhea, nausea and vomiting.  MSK: see HPI INTEGUMENTARY: Negative for rash.    Current Outpatient Medications:    acetaminophen (TYLENOL) 500 MG tablet, Take 1,000 mg by mouth every 6 (six) hours as needed for moderate pain., Disp: , Rfl:    albuterol (PROVENTIL) (2.5 MG/3ML) 0.083% nebulizer solution, USE 1 VIAL IN NEBULIZER EVERY 4 TO 6 HOURS AS NEEDED FOR SHORTNESS OF BREATH FOR WHEEZING FOR 5 DAYS, Disp: , Rfl:    albuterol (VENTOLIN HFA) 108 (90 Base) MCG/ACT inhaler, Inhale 2 puffs into the lungs every 6 (six) hours as needed for wheezing or shortness of breath., Disp: 8 g, Rfl: 3   alprazolam (XANAX) 2 MG tablet, Take 2 mg by mouth at bedtime., Disp: , Rfl:    amLODipine (NORVASC) 5 MG tablet, Take 1 tablet (5 mg total)  by mouth daily., Disp: 90 tablet, Rfl: 0   aspirin EC 81 MG tablet, Take 81 mg by mouth daily. Swallow whole., Disp: , Rfl:    bisoprolol (ZEBETA) 5 MG tablet, Take 1 tablet (5 mg total) by mouth daily., Disp: 90 tablet, Rfl: 0   cetirizine (ZYRTEC ALLERGY) 10 MG tablet, Take 1 tablet (10 mg total) by mouth daily., Disp: 30 tablet, Rfl: 3   esomeprazole (NEXIUM) 20 MG capsule, Take 40 mg by mouth daily., Disp: , Rfl:    Fluticasone-Umeclidin-Vilant (TRELEGY ELLIPTA) 100-62.5-25 MCG/ACT AEPB, Inhale into the lungs daily as needed., Disp: , Rfl:    gabapentin (NEURONTIN) 300 MG capsule, Take 1 capsule (300 mg total) by mouth at bedtime. (Patient taking differently: Take 300 mg by mouth at bedtime as needed (pain).), Disp: 90 capsule, Rfl: 1   ketoconazole (NIZORAL) 2 % shampoo, APPLY AND WASH TOPICALLY ONCE DAILY AS NEEDED, Disp: , Rfl:    losartan  (COZAAR) 50 MG tablet, Take 1 tablet by mouth once daily, Disp: 90 tablet, Rfl: 0   mupirocin ointment (BACTROBAN) 2 %, Apply 1 Application topically 2 (two) times daily for 5 days. In each nostril, Disp: 15 g, Rfl: 0   ondansetron (ZOFRAN) 4 MG tablet, Take 1 tablet (4 mg total) by mouth every 8 (eight) hours as needed for nausea or vomiting., Disp: 30 tablet, Rfl: 1   rosuvastatin (CRESTOR) 40 MG tablet, Take 1 tablet (40 mg total) by mouth daily., Disp: 90 tablet, Rfl: 3  Past Medical History:  Diagnosis Date   Acute bronchitis 03/07/2018   Acute hypoxemic respiratory failure (HCC) 03/07/2018   Acute thromboembolic cerebrovascular accident [facility])    Alcohol abuse    Amphetamine use    Anxiety    ASD (atrial septal defect) 07/19/2018   Asthma    Atypical squamous cells of undetermined significance on cytologic smear of cervix (ASC-US) 12/12/2019   Cervical strain 04/16/2014   Cervicogenic headache 04/16/2014   Chest pain, musculoskeletal 06/26/2019   Fell end of April 2021 > neg cxr 06/26/2019 with parasthesias > try zostrix    Chronic combined systolic and diastolic CHF (congestive heart failure) (HCC) 03/25/2018   Echo 02/2018: EF 40-45 // Echo 07/2018:  EF 55-60, trivial AI, small secundum ASD (L>R shunting).   Chronic pain of left knee 10/09/2021   Chronic sinusitis 09/01/2018   Onset 2005   - augmentin x 10 days 08/31/18    - 12/09/2018 referred to ENT  [place] eval >   - Sinus CT p course of augmentin started 05/24/2019 if not better   - Allergy profile 05/24/19  >  Eos 0.1/  IgE  2790    Cigarette smoker 03/07/2018   COPD (chronic obstructive pulmonary disease) (HCC)    COPD GOLD II/ active smoker  03/30/2018   Active smoker  - 03/30/2018   Walked RA  3  laps @  approx 272ft each @ avg pace  stopped due to end of study, mild sob and chest tight, no desats    - Spirometry 03/30/2018  FEV1 2.4  (94%)  Ratio 0.73 s physiologic curvature p > 4 h since last saba   - 03/30/2018    try trelegy sample  to see if reduces need for saba    - 04/14/2018  After extensive coaching inhaler device,  effectiveness =    75% fr   CVA (cerebral vascular accident) [facility])    Essential hypertension    Fibromyalgia    GERD (gastroesophageal reflux  disease)    Hyperlipidemia    Hypokalemia    Hypomagnesemia    Low grade squamous intraepithelial lesion (LGSIL) on cervicovaginal cytologic smear 09/12/2018   Migraines    NICM (nonischemic cardiomyopathy) (HCC) 03/25/2018   Other acute recurrent sinusitis 10/10/2021   Right leg weakness    Tobacco abuse    Vitamin B12 deficiency    Vitamin D deficiency    Objective:  PHYSICAL EXAM:   VS: BP 110/70 (BP Location: Left Arm, Patient Position: Sitting, Cuff Size: Normal)   Pulse 82   Temp (!) 97.5 F (36.4 C) (Temporal)   Ht 5\' 3"  (1.6 m)   Wt 162 lb 9.6 oz (73.8 kg)   SpO2 94%   BMI 28.80 kg/m   GEN: Well nourished, well developed, in no acute distress  Cardiac: RRR; no murmurs, rubs, or gallops,no edema -  Respiratory:  normal respiratory rate and pattern with no distress - normal breath sounds with no rales, rhonchi, wheezes or rubs GI: normal bowel sounds, no masses or tenderness  Skin: warm and dry, no rash   Psych: euthymic mood, appropriate affect and demeanor   Assessment & Plan:    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 In each nostril  Dispense: 15 g; Refill: 0 Recommend antiseptic body was for 5 days as well    Follow-up: Return for next scheduled chronic fasting visit.  An After Visit Summary was printed and given to the patient.  Jettie Pagan [facility] [phone]

## 2022-09-30 DIAGNOSIS — S83249A Other tear of medial meniscus, current injury, unspecified knee, initial encounter: Secondary | ICD-10-CM | POA: Diagnosis not present

## 2022-09-30 DIAGNOSIS — S83512S Sprain of anterior cruciate ligament of left knee, sequela: Secondary | ICD-10-CM | POA: Diagnosis not present

## 2022-09-30 DIAGNOSIS — M1712 Unilateral primary osteoarthritis, left knee: Secondary | ICD-10-CM | POA: Diagnosis not present

## 2022-10-02 ENCOUNTER — Ambulatory Visit: Payer: BC Managed Care – PPO | Attending: Internal Medicine

## 2022-10-02 DIAGNOSIS — E785 Hyperlipidemia, unspecified: Secondary | ICD-10-CM

## 2022-10-02 DIAGNOSIS — R051 Acute cough: Secondary | ICD-10-CM | POA: Diagnosis not present

## 2022-10-02 DIAGNOSIS — Z20822 Contact with and (suspected) exposure to covid-19: Secondary | ICD-10-CM | POA: Diagnosis not present

## 2022-10-02 DIAGNOSIS — R519 Headache, unspecified: Secondary | ICD-10-CM | POA: Diagnosis not present

## 2022-10-02 DIAGNOSIS — J209 Acute bronchitis, unspecified: Secondary | ICD-10-CM | POA: Diagnosis not present

## 2022-10-02 LAB — LIPID PANEL
Chol/HDL Ratio: 3.6 ratio (ref 0.0–4.4)
Cholesterol, Total: 134 mg/dL (ref 100–199)
HDL: 37 mg/dL — ABNORMAL LOW (ref >39)
LDL Chol Calc (NIH): 66 mg/dL (ref 0–99)
Triglycerides: 184 mg/dL — ABNORMAL HIGH (ref 0–149)
VLDL Cholesterol Cal: 31 mg/dL (ref 5–40)

## 2022-10-02 LAB — HEPATIC FUNCTION PANEL
ALT: 18 IU/L (ref 0–32)
AST: 28 IU/L (ref 0–40)
Albumin: 4.2 g/dL (ref 3.8–4.9)
Alkaline Phosphatase: 99 IU/L (ref 44–121)
Bilirubin Total: 0.4 mg/dL (ref 0.0–1.2)
Bilirubin, Direct: 0.12 mg/dL (ref 0.00–0.40)
Total Protein: 6.5 g/dL (ref 6.0–8.5)

## 2022-10-05 ENCOUNTER — Ambulatory Visit (INDEPENDENT_AMBULATORY_CARE_PROVIDER_SITE_OTHER): Payer: BC Managed Care – PPO

## 2022-10-05 ENCOUNTER — Telehealth: Payer: Self-pay | Admitting: *Deleted

## 2022-10-05 DIAGNOSIS — E785 Hyperlipidemia, unspecified: Secondary | ICD-10-CM

## 2022-10-05 DIAGNOSIS — I639 Cerebral infarction, unspecified: Secondary | ICD-10-CM | POA: Diagnosis not present

## 2022-10-05 LAB — CUP PACEART REMOTE DEVICE CHECK
Date Time Interrogation Session: 20240818230611
Implantable Pulse Generator Implant Date: 20240610

## 2022-10-05 MED ORDER — EZETIMIBE 10 MG PO TABS: 10.0000 mg | ORAL_TABLET | Freq: Every day | ORAL | 3 refills | Status: DC

## 2022-10-05 NOTE — Telephone Encounter (Signed)
Spoke w patient. She is on Crestor 40 mg and taking daily.  She will add Zetia 10 mg daily.  We will plan to schedule labs when she comes to see Dr. Lynnette Caffey on 11/02/22.

## 2022-10-05 NOTE — Telephone Encounter (Signed)
-----   Message from Orbie Pyo sent at 10/02/2022  5:44 PM EDT ----- Start zetia 10 (since she is on crestor 40); lipids/lfts in 2 mo

## 2022-10-11 ENCOUNTER — Other Ambulatory Visit: Payer: Self-pay | Admitting: Physician Assistant

## 2022-10-11 DIAGNOSIS — I5042 Chronic combined systolic (congestive) and diastolic (congestive) heart failure: Secondary | ICD-10-CM

## 2022-10-11 DIAGNOSIS — I10 Essential (primary) hypertension: Secondary | ICD-10-CM

## 2022-10-11 DIAGNOSIS — Z79899 Other long term (current) drug therapy: Secondary | ICD-10-CM

## 2022-10-15 NOTE — Progress Notes (Signed)
Carelink Summary Report / Loop Recorder 

## 2022-11-01 ENCOUNTER — Other Ambulatory Visit: Payer: Self-pay | Admitting: Physician Assistant

## 2022-11-01 NOTE — Progress Notes (Unsigned)
10 mg total) by mouth daily.   esomeprazole (NEXIUM) 20 MG capsule Take 40 mg by mouth daily.   ezetimibe (ZETIA) 10 MG tablet Take 1 tablet (10 mg total) by mouth daily.   Fluticasone-Umeclidin-Vilant (TRELEGY ELLIPTA) 100-62.5-25 MCG/ACT AEPB Inhale into the lungs daily as needed.   gabapentin (NEURONTIN) 300 MG capsule Take 1 capsule (300 mg total) by mouth at bedtime. (Patient taking differently: Take 300 mg by mouth at bedtime as needed (pain).)   ketoconazole (NIZORAL) 2 % shampoo APPLY AND WASH TOPICALLY ONCE DAILY AS NEEDED   ondansetron (ZOFRAN) 4 MG tablet Take 1 tablet (4 mg total) by mouth every 8 (eight) hours as needed for nausea or vomiting.   rosuvastatin (CRESTOR) 40 MG tablet Take 1 tablet (40 mg total) by mouth daily.   [DISCONTINUED] albuterol (VENTOLIN HFA) 108 (90 Base) MCG/ACT inhaler Inhale 2 puffs into the lungs every 6 (six) hours as needed for wheezing or shortness of breath.   [DISCONTINUED] losartan (COZAAR) 50 MG tablet Take 1 tablet by mouth once daily     Allergies:    Codeine, Sulfa antibiotics, and Spironolactone   Social  History:   Social History   Tobacco Use   Smoking status: Every Day    Current packs/day: 1.50    Average packs/day: 1.5 packs/day for 40.0 years (60.0 ttl pk-yrs)    Types: Cigarettes   Smokeless tobacco: Never  Vaping Use   Vaping status: Never Used  Substance Use Topics   Alcohol use: Yes    Comment: 2 x per week   Drug use: No     Family Hx: Family History  Problem Relation Age of Onset   Cancer Mother    Colitis Paternal Aunt    Ovarian cancer Maternal Grandmother      Review of Systems:   Please see the history of present illness.    All other systems reviewed and are negative.     EKGs/Labs/Other Test Reviewed:   EKG:    EKG Interpretation Date/Time:  Monday November 02 2022 10:07:48 EDT Ventricular Rate:  70 PR Interval:  152 QRS Duration:  68 QT Interval:  458 QTC Calculation: 494 R Axis:   59  Text Interpretation: Normal sinus rhythm Prolonged QT When compared with ECG of 07-Aug-2022 09:38, No significant change was found Confirmed by Alverda Skeans ([id number]) on 11/02/2022 10:09:46 AM        Prior CV studies reviewed: Cardiac Studies & Procedures   CARDIAC CATHETERIZATION  CARDIAC CATHETERIZATION 03/08/2018  Narrative Images from the original result were not included. Mary Franco is a 57 y.o. female   [id number] LOCATION:  FACILITY: [facility] PHYSICIAN: Nanetta Batty, M.D. [birth year]   DATE OF PROCEDURE:  03/08/2018  DATE OF DISCHARGE:     CARDIAC CATHETERIZATION    History obtained from chart review.  Mary Franco is a 57 year old Caucasian female admitted with systolic heart failure.  Her EF by 2D echo was 40 to 45%.  Her BNP was over thousand.  She is been diuresed.  She is a long history of tobacco abuse.  She had low flat troponins consistent with "demand ischemia".  She presents now for right left heart cath to define her anatomy and physiology.  Impression Mary Franco has a nonischemic cardiomyopathy with entirely normal  coronary arteries.  Her LVEDP is mildly elevated although her wedge is fairly normal and she has a excellent cardiac output.  Medical therapy will be recommended including lifestyle modification, smoking cessation and avoidance of salt.  evidence of pulmonic stenosis.  Aorta: The aortic root and ascending aorta are structurally normal, with no evidence of dilitation. There is moderate (Grade III) plaque involving the ascending aorta and descending aorta.  IAS/Shunts: Evidence of atrial level shunting detected by color flow Doppler. Agitated saline contrast was given intravenously to evaluate for intracardiac shunting. Agitated saline contrast bubble study was positive with shunting observed within 3-6 cardiac cycles suggestive of interatrial shunt.  Mary Lam MD Electronically signed by Mary Lam MD Signature Date/Time: 02/25/2022/5:12:33 PM    Final   [facility]  CARDIAC EVENT MONITOR 03/23/2022  Narrative Mary Franco, DOB [birth year], MRN [id number]  EVENT MONITOR REPORT:   Patient was monitored from 02/11/2022 to 03/12/2021. Indication:                    Palpitations Ordering physician:  Garwin Brothers, MD Referring physician:  Garwin Brothers, MD   Baseline rhythm: Sinus  Minimum heart rate: 60 BPM.  Maximal heart rate 122 BPM.  Atrial arrhythmia: None  significant  Ventricular arrhythmia: one 6 beat run of ventricular tachycardia.  Conduction abnormality: None significant  Symptoms: Multiple symptoms of chest pain lightheadedness and dizziness which did not correlate with any findings on the monitor.   Conclusion: Mildly abnormal monitor.  One 6 beat run of ventricular tachycardia.  Multiple  symptoms with no correlation on monitoring.  Interpreting  cardiologist: Garwin Brothers, MD Date: 03/30/2022 5:14 PM           Recent Labs: 07/09/2022: Magnesium 1.3 08/04/2022: TSH 0.731 09/21/2022: BUN 8; Creatinine, Ser 0.93; Hemoglobin 12.4; Platelets 357; Potassium 3.6; Sodium 142 10/02/2022: ALT 18   Lipid Panel    Component Value Date/Time   CHOL 134 10/02/2022 0922   TRIG 184 (H) 10/02/2022 0922   HDL 37 (L) 10/02/2022 0922   CHOLHDL 3.6 10/02/2022 0922   LDLCALC 66 10/02/2022 0922    Risk Assessment/Calculations:          Physical Exam:   VS:  BP 136/78   Pulse 75   Ht 5\' 3"  (1.6 m)   Wt 161 lb 9.6 oz (73.3 kg)   SpO2 96%   BMI 28.63 kg/m        Wt Readings from Last 3 Encounters:  11/02/22 161 lb 9.6 oz (73.3 kg)  09/21/22 162 lb 9.6 oz (73.8 kg)  08/11/22 160 lb (72.6 kg)      GENERAL:  No apparent distress, AOx3 HEENT:  No carotid bruits, +2 carotid impulses, no scleral icterus CAR: RRR no murmurs, gallops, rubs, or thrills RES:  Clear to auscultation bilaterally ABD:  Soft, nontender, nondistended, positive bowel sounds x 4 VASC:  +2 radial pulses, +2 carotid pulses NEURO:  CN 2-12 grossly intact; motor and sensory grossly intact PSYCH:  No active depression or anxiety EXT:  No edema, ecchymosis, or cyanosis  Signed, Orbie Pyo, MD  11/02/2022 10:29 AM    [facility] [address] [place], [place], [place]  [address] Phone: [phone]; Fax: [phone]   Note:  This document was prepared using Dragon voice recognition software and may include unintentional dictation errors.  02/25/2022  Narrative TRANSESOPHOGEAL ECHO REPORT    Patient Name:   Mary Franco Date of Exam: 02/25/2022 Medical Rec #:  [id number]        Height:       62.0 in Accession #:    [id number]       Weight:       147.0 lb Date of Birth:  [birth year]       BSA:          1.677 m Patient Age:    56 years         BP:           108/78 mmHg Patient Gender: F                HR:           72 bpm. Exam Location:  Outpatient  Procedure: Transesophageal Echo, Saline Contrast Bubble Study, Color Doppler and Cardiac Doppler  Indications:     Stroke  History:         Patient has prior history of Echocardiogram  examinations, most recent 07/18/2018. PFO/ASD, COPD; Risk Factors:Hypertension, Dyslipidemia and Current Smoker.  Sonographer:     Delcie Roch RDCS Referring Phys:  Rito Ehrlich [facility] Diagnosing Phys: Mary Lam MD  PROCEDURE: After discussion of the risks and benefits of a TEE, an informed consent was obtained from the patient. The transesophogeal probe was passed without difficulty through the esophogus of the patient. Imaged were obtained with the patient in a left lateral decubitus position. Sedation performed by different physician. The patient was monitored while under deep sedation. Anesthestetic sedation was provided intravenously by Anesthesiology: 243mg  of Propofol, 80mg  of Lidocaine. The patient developed no complications during the procedure.  IMPRESSIONS   1. Evidence of atrial level shunting detected by color flow Doppler. Agitated saline contrast bubble study was positive with shunting observed within 3-6 cardiac cycles suggestive of interatrial shunt. 2. Left ventricular ejection fraction, by estimation, is 55 to 60%. The left ventricle has normal function. 3. Right ventricular systolic function is normal. The right ventricular size is normal. 4. No left atrial/left atrial appendage thrombus was detected. The LAA emptying velocity was 44 cm/s. 5. The mitral valve is normal in structure. No evidence of mitral valve regurgitation. No evidence of mitral stenosis. 6. The aortic valve is tricuspid. Aortic valve regurgitation is not visualized. No aortic stenosis is present. 7. There is Moderate (Grade III) plaque involving the ascending aorta and descending aorta.  FINDINGS Left Ventricle: Left ventricular ejection fraction, by estimation, is 55 to 60%. The left ventricle has normal function. The left ventricular internal cavity size was normal in size.  Right Ventricle: The right ventricular size is normal. No increase in right ventricular wall thickness. Right  ventricular systolic function is normal.  Left Atrium: Left atrial size was normal in size. No left atrial/left atrial appendage thrombus was detected. The LAA emptying velocity was 44 cm/s.  Right Atrium: Right atrial size was normal in size.  Pericardium: Trivial pericardial effusion is present.  Mitral Valve: The mitral valve is normal in structure. No evidence of mitral valve regurgitation. No evidence of mitral valve stenosis.  Tricuspid Valve: The tricuspid valve is normal in structure. Tricuspid valve regurgitation is not demonstrated. No evidence of tricuspid stenosis.  Aortic Valve: The aortic valve is tricuspid. Aortic valve regurgitation is not visualized. No aortic stenosis is present.  Pulmonic Valve: The pulmonic valve was normal in structure. Pulmonic valve regurgitation is not visualized. No  10 mg total) by mouth daily.   esomeprazole (NEXIUM) 20 MG capsule Take 40 mg by mouth daily.   ezetimibe (ZETIA) 10 MG tablet Take 1 tablet (10 mg total) by mouth daily.   Fluticasone-Umeclidin-Vilant (TRELEGY ELLIPTA) 100-62.5-25 MCG/ACT AEPB Inhale into the lungs daily as needed.   gabapentin (NEURONTIN) 300 MG capsule Take 1 capsule (300 mg total) by mouth at bedtime. (Patient taking differently: Take 300 mg by mouth at bedtime as needed (pain).)   ketoconazole (NIZORAL) 2 % shampoo APPLY AND WASH TOPICALLY ONCE DAILY AS NEEDED   ondansetron (ZOFRAN) 4 MG tablet Take 1 tablet (4 mg total) by mouth every 8 (eight) hours as needed for nausea or vomiting.   rosuvastatin (CRESTOR) 40 MG tablet Take 1 tablet (40 mg total) by mouth daily.   [DISCONTINUED] albuterol (VENTOLIN HFA) 108 (90 Base) MCG/ACT inhaler Inhale 2 puffs into the lungs every 6 (six) hours as needed for wheezing or shortness of breath.   [DISCONTINUED] losartan (COZAAR) 50 MG tablet Take 1 tablet by mouth once daily     Allergies:    Codeine, Sulfa antibiotics, and Spironolactone   Social  History:   Social History   Tobacco Use   Smoking status: Every Day    Current packs/day: 1.50    Average packs/day: 1.5 packs/day for 40.0 years (60.0 ttl pk-yrs)    Types: Cigarettes   Smokeless tobacco: Never  Vaping Use   Vaping status: Never Used  Substance Use Topics   Alcohol use: Yes    Comment: 2 x per week   Drug use: No     Family Hx: Family History  Problem Relation Age of Onset   Cancer Mother    Colitis Paternal Aunt    Ovarian cancer Maternal Grandmother      Review of Systems:   Please see the history of present illness.    All other systems reviewed and are negative.     EKGs/Labs/Other Test Reviewed:   EKG:    EKG Interpretation Date/Time:  Monday November 02 2022 10:07:48 EDT Ventricular Rate:  70 PR Interval:  152 QRS Duration:  68 QT Interval:  458 QTC Calculation: 494 R Axis:   59  Text Interpretation: Normal sinus rhythm Prolonged QT When compared with ECG of 07-Aug-2022 09:38, No significant change was found Confirmed by Alverda Skeans ([id number]) on 11/02/2022 10:09:46 AM        Prior CV studies reviewed: Cardiac Studies & Procedures   CARDIAC CATHETERIZATION  CARDIAC CATHETERIZATION 03/08/2018  Narrative Images from the original result were not included. Mary Franco is a 57 y.o. female   [id number] LOCATION:  FACILITY: [facility] PHYSICIAN: Nanetta Batty, M.D. [birth year]   DATE OF PROCEDURE:  03/08/2018  DATE OF DISCHARGE:     CARDIAC CATHETERIZATION    History obtained from chart review.  Mary Franco is a 57 year old Caucasian female admitted with systolic heart failure.  Her EF by 2D echo was 40 to 45%.  Her BNP was over thousand.  She is been diuresed.  She is a long history of tobacco abuse.  She had low flat troponins consistent with "demand ischemia".  She presents now for right left heart cath to define her anatomy and physiology.  Impression Mary Franco has a nonischemic cardiomyopathy with entirely normal  coronary arteries.  Her LVEDP is mildly elevated although her wedge is fairly normal and she has a excellent cardiac output.  Medical therapy will be recommended including lifestyle modification, smoking cessation and avoidance of salt.  02/25/2022  Narrative TRANSESOPHOGEAL ECHO REPORT    Patient Name:   Mary Franco Date of Exam: 02/25/2022 Medical Rec #:  [id number]        Height:       62.0 in Accession #:    [id number]       Weight:       147.0 lb Date of Birth:  [birth year]       BSA:          1.677 m Patient Age:    56 years         BP:           108/78 mmHg Patient Gender: F                HR:           72 bpm. Exam Location:  Outpatient  Procedure: Transesophageal Echo, Saline Contrast Bubble Study, Color Doppler and Cardiac Doppler  Indications:     Stroke  History:         Patient has prior history of Echocardiogram  examinations, most recent 07/18/2018. PFO/ASD, COPD; Risk Factors:Hypertension, Dyslipidemia and Current Smoker.  Sonographer:     Delcie Roch RDCS Referring Phys:  Rito Ehrlich [facility] Diagnosing Phys: Mary Lam MD  PROCEDURE: After discussion of the risks and benefits of a TEE, an informed consent was obtained from the patient. The transesophogeal probe was passed without difficulty through the esophogus of the patient. Imaged were obtained with the patient in a left lateral decubitus position. Sedation performed by different physician. The patient was monitored while under deep sedation. Anesthestetic sedation was provided intravenously by Anesthesiology: 243mg  of Propofol, 80mg  of Lidocaine. The patient developed no complications during the procedure.  IMPRESSIONS   1. Evidence of atrial level shunting detected by color flow Doppler. Agitated saline contrast bubble study was positive with shunting observed within 3-6 cardiac cycles suggestive of interatrial shunt. 2. Left ventricular ejection fraction, by estimation, is 55 to 60%. The left ventricle has normal function. 3. Right ventricular systolic function is normal. The right ventricular size is normal. 4. No left atrial/left atrial appendage thrombus was detected. The LAA emptying velocity was 44 cm/s. 5. The mitral valve is normal in structure. No evidence of mitral valve regurgitation. No evidence of mitral stenosis. 6. The aortic valve is tricuspid. Aortic valve regurgitation is not visualized. No aortic stenosis is present. 7. There is Moderate (Grade III) plaque involving the ascending aorta and descending aorta.  FINDINGS Left Ventricle: Left ventricular ejection fraction, by estimation, is 55 to 60%. The left ventricle has normal function. The left ventricular internal cavity size was normal in size.  Right Ventricle: The right ventricular size is normal. No increase in right ventricular wall thickness. Right  ventricular systolic function is normal.  Left Atrium: Left atrial size was normal in size. No left atrial/left atrial appendage thrombus was detected. The LAA emptying velocity was 44 cm/s.  Right Atrium: Right atrial size was normal in size.  Pericardium: Trivial pericardial effusion is present.  Mitral Valve: The mitral valve is normal in structure. No evidence of mitral valve regurgitation. No evidence of mitral valve stenosis.  Tricuspid Valve: The tricuspid valve is normal in structure. Tricuspid valve regurgitation is not demonstrated. No evidence of tricuspid stenosis.  Aortic Valve: The aortic valve is tricuspid. Aortic valve regurgitation is not visualized. No aortic stenosis is present.  Pulmonic Valve: The pulmonic valve was normal in structure. Pulmonic valve regurgitation is not visualized. No  10 mg total) by mouth daily.   esomeprazole (NEXIUM) 20 MG capsule Take 40 mg by mouth daily.   ezetimibe (ZETIA) 10 MG tablet Take 1 tablet (10 mg total) by mouth daily.   Fluticasone-Umeclidin-Vilant (TRELEGY ELLIPTA) 100-62.5-25 MCG/ACT AEPB Inhale into the lungs daily as needed.   gabapentin (NEURONTIN) 300 MG capsule Take 1 capsule (300 mg total) by mouth at bedtime. (Patient taking differently: Take 300 mg by mouth at bedtime as needed (pain).)   ketoconazole (NIZORAL) 2 % shampoo APPLY AND WASH TOPICALLY ONCE DAILY AS NEEDED   ondansetron (ZOFRAN) 4 MG tablet Take 1 tablet (4 mg total) by mouth every 8 (eight) hours as needed for nausea or vomiting.   rosuvastatin (CRESTOR) 40 MG tablet Take 1 tablet (40 mg total) by mouth daily.   [DISCONTINUED] albuterol (VENTOLIN HFA) 108 (90 Base) MCG/ACT inhaler Inhale 2 puffs into the lungs every 6 (six) hours as needed for wheezing or shortness of breath.   [DISCONTINUED] losartan (COZAAR) 50 MG tablet Take 1 tablet by mouth once daily     Allergies:    Codeine, Sulfa antibiotics, and Spironolactone   Social  History:   Social History   Tobacco Use   Smoking status: Every Day    Current packs/day: 1.50    Average packs/day: 1.5 packs/day for 40.0 years (60.0 ttl pk-yrs)    Types: Cigarettes   Smokeless tobacco: Never  Vaping Use   Vaping status: Never Used  Substance Use Topics   Alcohol use: Yes    Comment: 2 x per week   Drug use: No     Family Hx: Family History  Problem Relation Age of Onset   Cancer Mother    Colitis Paternal Aunt    Ovarian cancer Maternal Grandmother      Review of Systems:   Please see the history of present illness.    All other systems reviewed and are negative.     EKGs/Labs/Other Test Reviewed:   EKG:    EKG Interpretation Date/Time:  Monday November 02 2022 10:07:48 EDT Ventricular Rate:  70 PR Interval:  152 QRS Duration:  68 QT Interval:  458 QTC Calculation: 494 R Axis:   59  Text Interpretation: Normal sinus rhythm Prolonged QT When compared with ECG of 07-Aug-2022 09:38, No significant change was found Confirmed by Alverda Skeans ([id number]) on 11/02/2022 10:09:46 AM        Prior CV studies reviewed: Cardiac Studies & Procedures   CARDIAC CATHETERIZATION  CARDIAC CATHETERIZATION 03/08/2018  Narrative Images from the original result were not included. Mary Franco is a 57 y.o. female   [id number] LOCATION:  FACILITY: [facility] PHYSICIAN: Nanetta Batty, M.D. [birth year]   DATE OF PROCEDURE:  03/08/2018  DATE OF DISCHARGE:     CARDIAC CATHETERIZATION    History obtained from chart review.  Mary Franco is a 57 year old Caucasian female admitted with systolic heart failure.  Her EF by 2D echo was 40 to 45%.  Her BNP was over thousand.  She is been diuresed.  She is a long history of tobacco abuse.  She had low flat troponins consistent with "demand ischemia".  She presents now for right left heart cath to define her anatomy and physiology.  Impression Mary Franco has a nonischemic cardiomyopathy with entirely normal  coronary arteries.  Her LVEDP is mildly elevated although her wedge is fairly normal and she has a excellent cardiac output.  Medical therapy will be recommended including lifestyle modification, smoking cessation and avoidance of salt.  02/25/2022  Narrative TRANSESOPHOGEAL ECHO REPORT    Patient Name:   Mary Franco Date of Exam: 02/25/2022 Medical Rec #:  [id number]        Height:       62.0 in Accession #:    [id number]       Weight:       147.0 lb Date of Birth:  [birth year]       BSA:          1.677 m Patient Age:    56 years         BP:           108/78 mmHg Patient Gender: F                HR:           72 bpm. Exam Location:  Outpatient  Procedure: Transesophageal Echo, Saline Contrast Bubble Study, Color Doppler and Cardiac Doppler  Indications:     Stroke  History:         Patient has prior history of Echocardiogram  examinations, most recent 07/18/2018. PFO/ASD, COPD; Risk Factors:Hypertension, Dyslipidemia and Current Smoker.  Sonographer:     Delcie Roch RDCS Referring Phys:  Rito Ehrlich [facility] Diagnosing Phys: Mary Lam MD  PROCEDURE: After discussion of the risks and benefits of a TEE, an informed consent was obtained from the patient. The transesophogeal probe was passed without difficulty through the esophogus of the patient. Imaged were obtained with the patient in a left lateral decubitus position. Sedation performed by different physician. The patient was monitored while under deep sedation. Anesthestetic sedation was provided intravenously by Anesthesiology: 243mg  of Propofol, 80mg  of Lidocaine. The patient developed no complications during the procedure.  IMPRESSIONS   1. Evidence of atrial level shunting detected by color flow Doppler. Agitated saline contrast bubble study was positive with shunting observed within 3-6 cardiac cycles suggestive of interatrial shunt. 2. Left ventricular ejection fraction, by estimation, is 55 to 60%. The left ventricle has normal function. 3. Right ventricular systolic function is normal. The right ventricular size is normal. 4. No left atrial/left atrial appendage thrombus was detected. The LAA emptying velocity was 44 cm/s. 5. The mitral valve is normal in structure. No evidence of mitral valve regurgitation. No evidence of mitral stenosis. 6. The aortic valve is tricuspid. Aortic valve regurgitation is not visualized. No aortic stenosis is present. 7. There is Moderate (Grade III) plaque involving the ascending aorta and descending aorta.  FINDINGS Left Ventricle: Left ventricular ejection fraction, by estimation, is 55 to 60%. The left ventricle has normal function. The left ventricular internal cavity size was normal in size.  Right Ventricle: The right ventricular size is normal. No increase in right ventricular wall thickness. Right  ventricular systolic function is normal.  Left Atrium: Left atrial size was normal in size. No left atrial/left atrial appendage thrombus was detected. The LAA emptying velocity was 44 cm/s.  Right Atrium: Right atrial size was normal in size.  Pericardium: Trivial pericardial effusion is present.  Mitral Valve: The mitral valve is normal in structure. No evidence of mitral valve regurgitation. No evidence of mitral valve stenosis.  Tricuspid Valve: The tricuspid valve is normal in structure. Tricuspid valve regurgitation is not demonstrated. No evidence of tricuspid stenosis.  Aortic Valve: The aortic valve is tricuspid. Aortic valve regurgitation is not visualized. No aortic stenosis is present.  Pulmonic Valve: The pulmonic valve was normal in structure. Pulmonic valve regurgitation is not visualized. No

## 2022-11-02 ENCOUNTER — Encounter: Payer: Self-pay | Admitting: Internal Medicine

## 2022-11-02 ENCOUNTER — Ambulatory Visit: Payer: BC Managed Care – PPO | Attending: Internal Medicine | Admitting: Internal Medicine

## 2022-11-02 VITALS — BP 136/78 | HR 75 | Ht 63.0 in | Wt 161.6 lb

## 2022-11-02 DIAGNOSIS — I700 Atherosclerosis of aorta: Secondary | ICD-10-CM | POA: Diagnosis not present

## 2022-11-02 DIAGNOSIS — E785 Hyperlipidemia, unspecified: Secondary | ICD-10-CM

## 2022-11-02 DIAGNOSIS — I10 Essential (primary) hypertension: Secondary | ICD-10-CM | POA: Diagnosis not present

## 2022-11-02 DIAGNOSIS — Z79899 Other long term (current) drug therapy: Secondary | ICD-10-CM

## 2022-11-02 DIAGNOSIS — I5042 Chronic combined systolic (congestive) and diastolic (congestive) heart failure: Secondary | ICD-10-CM

## 2022-11-02 DIAGNOSIS — I639 Cerebral infarction, unspecified: Secondary | ICD-10-CM

## 2022-11-02 DIAGNOSIS — J449 Chronic obstructive pulmonary disease, unspecified: Secondary | ICD-10-CM

## 2022-11-02 DIAGNOSIS — R0609 Other forms of dyspnea: Secondary | ICD-10-CM

## 2022-11-02 MED ORDER — LOSARTAN POTASSIUM 50 MG PO TABS: 50.0000 mg | ORAL_TABLET | Freq: Every day | ORAL | 3 refills | Status: DC

## 2022-11-02 MED ORDER — ALBUTEROL SULFATE HFA 108 (90 BASE) MCG/ACT IN AERS: 2.0000 | INHALATION_SPRAY | Freq: Four times a day (QID) | RESPIRATORY_TRACT | 0 refills | Status: DC | PRN

## 2022-11-02 NOTE — Patient Instructions (Signed)
Medication Instructions:  No changes *If you need a refill on your cardiac medications before your next appointment, please call your pharmacy*   Lab Work: none If you have labs (blood work) drawn today and your tests are completely normal, you will receive your results only by: MyChart Message (if you have MyChart) OR A paper copy in the mail If you have any lab test that is abnormal or we need to change your treatment, we will call you to review the results.   Testing/Procedures: none   Follow-Up: As needed based on recommendations by Dr. Pearlean Brownie

## 2022-11-09 ENCOUNTER — Ambulatory Visit (INDEPENDENT_AMBULATORY_CARE_PROVIDER_SITE_OTHER): Payer: BC Managed Care – PPO

## 2022-11-09 DIAGNOSIS — I639 Cerebral infarction, unspecified: Secondary | ICD-10-CM

## 2022-11-10 DIAGNOSIS — M1712 Unilateral primary osteoarthritis, left knee: Secondary | ICD-10-CM | POA: Diagnosis not present

## 2022-11-10 LAB — CUP PACEART REMOTE DEVICE CHECK
Date Time Interrogation Session: 20240920230049
Implantable Pulse Generator Implant Date: 20240610

## 2022-11-19 ENCOUNTER — Ambulatory Visit: Payer: BC Managed Care – PPO | Admitting: Neurology

## 2022-11-19 ENCOUNTER — Encounter: Payer: Self-pay | Admitting: Neurology

## 2022-11-19 ENCOUNTER — Other Ambulatory Visit: Payer: Self-pay | Admitting: Orthopedic Surgery

## 2022-11-19 VITALS — BP 125/71 | HR 72 | Ht 63.0 in | Wt 162.0 lb

## 2022-11-19 DIAGNOSIS — F17210 Nicotine dependence, cigarettes, uncomplicated: Secondary | ICD-10-CM

## 2022-11-19 DIAGNOSIS — I639 Cerebral infarction, unspecified: Secondary | ICD-10-CM | POA: Diagnosis not present

## 2022-11-19 DIAGNOSIS — Q2112 Patent foramen ovale: Secondary | ICD-10-CM | POA: Diagnosis not present

## 2022-11-19 DIAGNOSIS — M25562 Pain in left knee: Secondary | ICD-10-CM

## 2022-11-19 NOTE — Progress Notes (Signed)
[facility] [address] [place]. [place] [address] [phone]       OFFICE FOLLOW-UP VISIT NOTE  [name] Date of Birth:  02-24-1965 Medical Record Number:  [id number]   Referring MD:: Alverda Skeans  Reason for Referral: Stroke and PFO  HPI: Initial visit 06/16/2022 Mary Franco is a 57 year old Caucasian lady seen today for initial office consultation visit for stroke.  History is obtained from the patient and review of electronic medical records I personally reviewed the available pertinent imaging films in PACS.  She has past medical history of DM, alcohol and amphetamine abuse, hyperlipidemia, hypertension, nonischemic cardiomyopathy, fibromyalgia and gastroesophageal reflux disease.  Patient woke up on 02/08/2022 and noticed heaviness weakness and numbness in the right leg.  Much of it was as the day went on she noticed worsening of her speech as well and increased leg weakness.  She was not able to continue at work and went home.  Family convinced her to go to the ER that night and use went to [facility] where she was admitted for 4 days.  MRI scan of the brain small embolic infarct involving left insular and parietal lobes.  CT angiogram of brain and neck showed 50% bilateral carotid bifurcation stenosis with no large vessel occlusion.  2D echo showed normal ejection fraction.  Dilated with an outpatient on 02/25/2022 showed ejection fraction 55 to 60%.  No clots.  There was evidence of a small right to left.  LDL cholesterol was 77 mg percent.  Patient did wear a 30-day heart monitor which was done in January but I did not find the results patient is unaware of the results-patient has since been taking aspirin and Plavix and tolerating well with only minor bruising and no bleeding.  She said right leg weakness has recovered completely.  She is more bothered by left knee which is chronic due to her torn ACL and the knee brace.  She plans to have left knee  surgery soon but prior to that she needs dental extraction he is here today asking for neurological clearance for both procedures.  She denies any prior history of strokes TIAs or significant neurological problems..There is family history of stroke in her grandfather.  Patient smokes and knows that she needs to quit.  Will refer to cardiologist Dr Lynnette Caffey who wants an opinion about possible endovascular PFO closure.  She denies any prior Palpitations, atrial fibrillation, syncope or significant coronary artery disease. Update 11/19/2022 : She returns for follow-up after last visit 5 months ago.  She is doing well from stroke standpoint without recurrent stroke or TIA symptoms.  She is most bothered today by her left knee pain.  She had left ACL tear and had scheduled surgery earlier but her insurance has changed and now needs to reschedule.  She had cut back smoking cigarettes to 2 cigarettes/day but now because of the stress she is gone up smoking again but is not smoking as much as she used to.  She had a loop recorder inserted and so far paroxysmal A-fib has not yet been found.  Lab work on 08/04/2022 showed hemoglobin A1c to be 6.2 and LDL cholesterol to be 71 mg percent.  She was seen by interventional cardiologist Dr. Cherlynn Perches on 11/02/2022 patient has not yet decided whether she wants PFO closure.  Explained to her that her risk is at the most medium but she also needs to focus on controlling her modifiable risk factors  like smoking cessation and  being compliant with her medications controlling blood pressure sugar and cholesterol and losing weight and eating healthy. ROS:   14 system review of systems is positive for leg weakness, numbness, speech difficulty, knee pain, difficulty walking and all other systems negative  PMH:  Past Medical History:  Diagnosis Date   Acute bronchitis 03/07/2018   Acute hypoxemic respiratory failure (HCC) 03/07/2018   Acute thromboembolic cerebrovascular accident [place])     Alcohol abuse    Amphetamine use    Anxiety    ASD (atrial septal defect) 07/19/2018   Asthma    Atypical squamous cells of undetermined significance on cytologic smear of cervix (ASC-US) 12/12/2019   Cervical strain 04/16/2014   Cervicogenic headache 04/16/2014   Chest pain, musculoskeletal 06/26/2019   Fell end of April 2021 > neg cxr 06/26/2019 with parasthesias > try zostrix    Chronic combined systolic and diastolic CHF (congestive heart failure) (HCC) 03/25/2018   Echo 02/2018: EF 40-45 // Echo 07/2018:  EF 55-60, trivial AI, small secundum ASD (L>R shunting).   Chronic pain of left knee 10/09/2021   Chronic sinusitis 09/01/2018   Onset 2005   - augmentin x 10 days 08/31/18    - 12/09/2018 referred to ENT  [place] eval >   - Sinus CT p course of augmentin started 05/24/2019 if not better   - Allergy profile 05/24/19  >  Eos 0.1/  IgE  2790    Cigarette smoker 03/07/2018   COPD (chronic obstructive pulmonary disease) (HCC)    COPD GOLD II/ active smoker  03/30/2018   Active smoker  - 03/30/2018   Walked RA  3  laps @  approx 247ft each @ avg pace  stopped due to end of study, mild sob and chest tight, no desats    - Spirometry 03/30/2018  FEV1 2.4  (94%)  Ratio 0.73 s physiologic curvature p > 4 h since last saba   - 03/30/2018    try trelegy sample to see if reduces need for saba    - 04/14/2018  After extensive coaching inhaler device,  effectiveness =    75% fr   CVA (cerebral vascular accident) (HCC)    Essential hypertension    Fibromyalgia    GERD (gastroesophageal reflux disease)    Hyperlipidemia    Hypokalemia    Hypomagnesemia    Low grade squamous intraepithelial lesion (LGSIL) on cervicovaginal cytologic smear 09/12/2018   Migraines    NICM (nonischemic cardiomyopathy) (HCC) 03/25/2018   Other acute recurrent sinusitis 10/10/2021   Right leg weakness    Tobacco abuse    Vitamin B12 deficiency    Vitamin D deficiency     Social History:  Social History   Socioeconomic  History   Marital status: Divorced    Spouse name: Not on file   Number of children: 2   Years of education: Not on file   Highest education level: Not on file  Occupational History   Occupation: Textiles  Tobacco Use   Smoking status: Every Day    Current packs/day: 1.50    Average packs/day: 1.5 packs/day for 40.0 years (60.0 ttl pk-yrs)    Types: Cigarettes   Smokeless tobacco: Never  Vaping Use   Vaping status: Never Used  Substance and Sexual Activity   Alcohol use: Yes    Comment: 2 x per week   Drug use: No   Sexual activity: Not Currently  Other Topics Concern   Not on file  Social History Narrative   **  Merged History Encounter **   Patient is right handed.   Patient drinks 3 cups caffeine daily.   Working on 2nd shift now at her job.  08-15-14          Social Determinants of Health   Financial Resource Strain: Low Risk  (09/21/2022)   Overall Financial Resource Strain (CARDIA)    Difficulty of Paying Living Expenses: Not hard at all  Food Insecurity: No Food Insecurity (09/21/2022)   Hunger Vital Sign    Worried About Running Out of Food in the Last Year: Never true    Ran Out of Food in the Last Year: Never true  Transportation Needs: No Transportation Needs (09/21/2022)   PRAPARE - Administrator, Civil Service (Medical): No    Lack of Transportation (Non-Medical): No  Physical Activity: Inactive (09/21/2022)   Exercise Vital Sign    Days of Exercise per Week: 0 days    Minutes of Exercise per Session: 0 min  Stress: Stress Concern Present (09/21/2022)   [employer] of Occupational Health - Occupational Stress Questionnaire    Feeling of Stress : To some extent  Social Connections: Socially Isolated (09/21/2022)   Social Connection and Isolation Panel [NHANES]    Frequency of Communication with Friends and Family: More than three times a week    Frequency of Social Gatherings with Friends and Family: More than three times a week    Attends  Religious Services: Never    Database administrator or Organizations: No    Attends Banker Meetings: Never    Marital Status: Divorced  Catering manager Violence: Not At Risk (09/21/2022)   Humiliation, Afraid, Rape, and Kick questionnaire    Fear of Current or Ex-Partner: No    Emotionally Abused: No    Physically Abused: No    Sexually Abused: No    Medications:   Current Outpatient Medications on File Prior to Visit  Medication Sig Dispense Refill   acetaminophen (TYLENOL) 500 MG tablet Take 1,000 mg by mouth every 6 (six) hours as needed for moderate pain.     albuterol (PROVENTIL) (2.5 MG/3ML) 0.083% nebulizer solution USE 1 VIAL IN NEBULIZER EVERY 4 TO 6 HOURS AS NEEDED FOR SHORTNESS OF BREATH FOR WHEEZING FOR 5 DAYS     albuterol (VENTOLIN HFA) 108 (90 Base) MCG/ACT inhaler Inhale 2 puffs into the lungs every 6 (six) hours as needed for wheezing or shortness of breath. 8 g 0   alprazolam (XANAX) 2 MG tablet Take 2 mg by mouth at bedtime.     amLODipine (NORVASC) 5 MG tablet Take 1 tablet (5 mg total) by mouth daily. 90 tablet 0   bisoprolol (ZEBETA) 5 MG tablet Take 1 tablet by mouth once daily 90 tablet 0   cetirizine (ZYRTEC ALLERGY) 10 MG tablet Take 1 tablet (10 mg total) by mouth daily. 30 tablet 3   esomeprazole (NEXIUM) 20 MG capsule Take 40 mg by mouth daily.     ezetimibe (ZETIA) 10 MG tablet Take 1 tablet (10 mg total) by mouth daily. 90 tablet 3   Fluticasone-Umeclidin-Vilant (TRELEGY ELLIPTA) 100-62.5-25 MCG/ACT AEPB Inhale into the lungs daily as needed.     gabapentin (NEURONTIN) 300 MG capsule Take 1 capsule (300 mg total) by mouth at bedtime. (Patient taking differently: Take 300 mg by mouth at bedtime as needed (pain).) 90 capsule 1   ketoconazole (NIZORAL) 2 % shampoo APPLY AND WASH TOPICALLY ONCE DAILY AS NEEDED     losartan (  COZAAR) 50 MG tablet Take 1 tablet (50 mg total) by mouth daily. 90 tablet 3   ondansetron (ZOFRAN) 4 MG tablet Take 1 tablet  (4 mg total) by mouth every 8 (eight) hours as needed for nausea or vomiting. 30 tablet 1   rosuvastatin (CRESTOR) 40 MG tablet Take 1 tablet (40 mg total) by mouth daily. 90 tablet 3   aspirin EC 81 MG tablet Take 81 mg by mouth daily. Swallow whole. (Patient not taking: Reported on 11/19/2022)     No current facility-administered medications on file prior to visit.    Allergies:   Allergies  Allergen Reactions   Codeine Itching   Sulfa Antibiotics Itching   Spironolactone     Electrolyte imbalance    Physical Exam General: well developed, well nourished pleasant middle-age Caucasian lady, seated, in no evident distress Head: head normocephalic and atraumatic.   Neck: supple with no carotid or supraclavicular bruits Cardiovascular: regular rate and rhythm, no murmurs Musculoskeletal: no deformity Skin:  no rash/petichiae Vascular:  Normal pulses all extremities  Neurologic Exam Mental Status: Awake and fully alert. Oriented to place and time. Recent and remote memory intact. Attention span, concentration and fund of knowledge appropriate. Mood and affect appropriate.  Cranial Nerves: Fundoscopic exam reveals sharp disc margins. Pupils equal, briskly reactive to light. Extraocular movements full without nystagmus. Visual fields full to confrontation. Hearing intact. Facial sensation intact. Face, tongue, palate moves normally and symmetrically.  Motor: Normal bulk and tone. Normal strength in all tested extremity muscles. Sensory.: intact to touch , pinprick , position and vibratory sensation.  Coordination: Rapid alternating movements normal in all extremities. Finger-to-nose and heel-to-shin performed accurately bilaterally. Gait and Station: Arises from chair without difficulty. Stance is normal. Gait demonstrates normal stride length and balance .  Left knee due to pain..  Reflexes: 1+ and symmetric. Toes downgoing.   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  Vascular risk factors of mild hyperlipidemia, extracranial atherosclerosis, ongoing tobacco abuse and remote substance abuse and small PFO     PLAN::I had a long d/w patient about her  recent cryptogenic stroke, small PFO,risk for recurrent stroke/TIAs, personally independently reviewed imaging studies and stroke evaluation results and answered questions.Continue aspirin 81 mg daily  for secondary stroke prevention  and maintain strict control of hypertension with blood pressure goal below 130/90, diabetes with hemoglobin A1c goal below 6.5% and lipids with LDL cholesterol goal below 70 mg/dL. I also advised the patient to eat a healthy diet with plenty of whole grains, cereals, fruits and vegetables, exercise regularly and maintain ideal body weight .she does have a PFO but low ROPE score of 5 would make her only and borderline risk of PFO being causative for her stroke.  She wants to wait and have her knee surgery first and think about PFO closure later.  Patient was counseled to quit smoking see Primary Care Physician for smoking cessation help as well as left sole pain and tenderness..  . Return for follow-up in 6 months or call earlier if necessary..Greater than 50% time during this  35 minute visit was spent on counseling and coordination of care about her cryptogenic stroke and PFO and discussion about evaluation Delia Heady, MD  Note: This document was prepared with digital dictation and possible smart phrase technology. Any transcriptional errors that result from this process are unintentional.

## 2022-11-19 NOTE — Patient Instructions (Signed)
:  I had a long d/w patient about her  recent cryptogenic stroke, small PFO,risk for recurrent stroke/TIAs, personally independently reviewed imaging studies and stroke evaluation results and answered questions.Continue aspirin 81 mg daily  for secondary stroke prevention  and maintain strict control of hypertension with blood pressure goal below 130/90, diabetes with hemoglobin A1c goal below 6.5% and lipids with LDL cholesterol goal below 70 mg/dL. I also advised the patient to eat a healthy diet with plenty of whole grains, cereals, fruits and vegetables, exercise regularly and maintain ideal body weight .she does have a PFO but low ROPE score of 5 would make her only and borderline risk of PFO being causative for her stroke.  She wants to wait and have her knee surgery first and think about PFO closure later.  Patient was counseled to quit smoking see Primary Care Physician for smoking cessation help as well as left sole pain and tenderness..  . Return for follow-up in 6 months or call earlier if necessary.

## 2022-11-20 NOTE — Progress Notes (Signed)
Carelink Summary Report / Loop Recorder 

## 2022-11-24 ENCOUNTER — Ambulatory Visit
Admission: RE | Admit: 2022-11-24 | Discharge: 2022-11-24 | Disposition: A | Discharge status: Home / Self Care | Payer: BC Managed Care – PPO | Source: Ambulatory Visit | Attending: Orthopedic Surgery | Admitting: Orthopedic Surgery

## 2022-11-24 DIAGNOSIS — R070 Pain in throat: Secondary | ICD-10-CM | POA: Diagnosis not present

## 2022-11-24 DIAGNOSIS — M25562 Pain in left knee: Secondary | ICD-10-CM

## 2022-11-24 DIAGNOSIS — W57XXXA Bitten or stung by nonvenomous insect and other nonvenomous arthropods, initial encounter: Secondary | ICD-10-CM | POA: Diagnosis not present

## 2022-11-24 DIAGNOSIS — L03113 Cellulitis of right upper limb: Secondary | ICD-10-CM | POA: Diagnosis not present

## 2022-11-24 DIAGNOSIS — R051 Acute cough: Secondary | ICD-10-CM | POA: Diagnosis not present

## 2022-11-24 DIAGNOSIS — J069 Acute upper respiratory infection, unspecified: Secondary | ICD-10-CM | POA: Diagnosis not present

## 2022-11-24 DIAGNOSIS — M1712 Unilateral primary osteoarthritis, left knee: Secondary | ICD-10-CM | POA: Diagnosis not present

## 2022-11-24 DIAGNOSIS — M23322 Other meniscus derangements, posterior horn of medial meniscus, left knee: Secondary | ICD-10-CM | POA: Diagnosis not present

## 2022-11-25 DIAGNOSIS — M1712 Unilateral primary osteoarthritis, left knee: Secondary | ICD-10-CM | POA: Insufficient documentation

## 2022-11-25 HISTORY — DX: Unilateral primary osteoarthritis, left knee: M17.12

## 2022-12-01 DIAGNOSIS — M25562 Pain in left knee: Secondary | ICD-10-CM | POA: Diagnosis not present

## 2022-12-04 ENCOUNTER — Telehealth: Payer: Self-pay

## 2022-12-04 NOTE — Telephone Encounter (Signed)
Dr. Lynnette Caffey,  You saw this patient on 11/02/2022. Per office protocol, will you please comment on medical clearance for left knee scope not yet scheduled?  Please route your response to P CV DIV Preop. I will communicate with requesting office once you have given recommendations.   Thank you!  Carlos Levering, NP

## 2022-12-04 NOTE — Telephone Encounter (Signed)
Name: Mary Franco  DOB: [birth year]  MRN: [id number]   Primary Cardiologist: Kristeen Miss, MD  Chart reviewed as part of pre-operative protocol coverage. Mary Franco was last seen on 11/02/2022 by Dr. Lynnette Caffey.  She had a normal stress test in June 2024. Per Dr. Lynnette Caffey "She is at low risk for perioperative cardiovascular findings."   Ideally aspirin should be continued without interruption, however if the bleeding risk is too great, aspirin may be held for 5-7 days prior to surgery. Please resume aspirin post operatively when it is felt to be safe from a bleeding standpoint.    I will route this recommendation to the requesting party via Epic fax function and remove from pre-op pool. Please call with questions.  Carlos Levering, NP 12/04/2022, 4:08 PM

## 2022-12-04 NOTE — Telephone Encounter (Signed)
Pre-operative Risk Assessment    Patient Name: Mary Franco  DOB: 1965-04-22 MRN: [id number]   Last office visit 11/02/22  No upcoming visit   Request for Surgical Clearance    Procedure:  Left Knee Scope PMM  Date of Surgery:  Clearance TBD                                 Surgeon:  Dr. Duwayne Heck  Surgeon's Group or Practice Name:  [facility]  Phone number:  [phone] Fax number:  [phone]   Type of Clearance Requested:   - Pharmacy:  Hold Aspirin     Type of Anesthesia:  Not Indicated   Additional requests/questions:    Scarlette Shorts   12/04/2022, 3:20 PM

## 2022-12-11 ENCOUNTER — Ambulatory Visit (INDEPENDENT_AMBULATORY_CARE_PROVIDER_SITE_OTHER): Payer: BC Managed Care – PPO | Admitting: Physician Assistant

## 2022-12-11 ENCOUNTER — Encounter: Payer: Self-pay | Admitting: Physician Assistant

## 2022-12-11 VITALS — BP 138/80 | HR 74 | Temp 97.4°F | Resp 14 | Ht 63.0 in | Wt 166.0 lb

## 2022-12-11 DIAGNOSIS — I10 Essential (primary) hypertension: Secondary | ICD-10-CM

## 2022-12-11 DIAGNOSIS — R739 Hyperglycemia, unspecified: Secondary | ICD-10-CM | POA: Diagnosis not present

## 2022-12-11 DIAGNOSIS — I700 Atherosclerosis of aorta: Secondary | ICD-10-CM | POA: Diagnosis not present

## 2022-12-11 DIAGNOSIS — E782 Mixed hyperlipidemia: Secondary | ICD-10-CM | POA: Diagnosis not present

## 2022-12-11 DIAGNOSIS — Z23 Encounter for immunization: Secondary | ICD-10-CM | POA: Diagnosis not present

## 2022-12-11 DIAGNOSIS — E559 Vitamin D deficiency, unspecified: Secondary | ICD-10-CM

## 2022-12-11 DIAGNOSIS — J449 Chronic obstructive pulmonary disease, unspecified: Secondary | ICD-10-CM

## 2022-12-11 NOTE — Progress Notes (Signed)
Subjective:  Patient ID: Mary Franco, female    DOB: 1965/04/01  Age: 57 y.o. MRN: [id number]  Chief Complaint  Patient presents with   Medical Management of Chronic Issues   HPI Pt with history of hypertension and history of cryptogenic stroke in 12/23.  She is currently following with cardiology.  She has known PFO and recently saw Dr Elberta Fortis and had ILR placed  She is currently taking norvasc 5mg , bisoprolol 5mg , and losartan 50mg  as well as ASA 81mg  every day Bp is elevated today but she feels stressed and drinking energy drink/coffee this am and did not take meds this morning Denies chest pain or shortness of breath  Pt also follows with neurology (Dr Pearlean Brownie) for history of stroke.  Marland Kitchen  He is the provider that set up for ILR Last appt was in the summer and unsure of next appt  Pt was diagnosed with aortic atherosclerosis at same time of her stroke and started on crestor 40mg  every day and zetia 10mg   Pt with history of copd/emphysema  Pt does continue to smoke about 3/4ppd - She currently uses ventolin prn and telegy  Pt with history of GERD - stable on Nexium 40mg   Pt with history of anxiety - takes xanax 2mg  prn  Pt is following with ortho at this time and is scheduled for eft knee replacement due to OA, ACL tear and meniscal tear   Pt would like tetanus booster today    09/21/2022   11:27 AM 08/04/2022   10:53 AM 10/28/2021    7:45 AM 07/10/2020   10:01 AM  Depression screen PHQ 2/9  Decreased Interest 0 0 1 2  Down, Depressed, Hopeless 0 0 0 0  PHQ - 2 Score 0 0 1 2  Altered sleeping 0 2 0 3  Tired, decreased energy 0 3 3 1   Change in appetite 0 1 1 2   Feeling bad or failure about yourself  0  0 0  Trouble concentrating 0 0 0 0  Moving slowly or fidgety/restless 0 0 0 0  Suicidal thoughts 0 0 0 0  PHQ-9 Score 0 6 5 8   Difficult doing work/chores Not difficult at all Somewhat difficult Somewhat difficult Not difficult at all        07/10/2020   10:01 AM  10/28/2021    7:44 AM 02/25/2022   10:59 AM 08/04/2022   10:53 AM 09/21/2022   11:27 AM  Fall Risk  Falls in the past year? 0 0  0 0  Was there an injury with Fall? 0 0  0 0  Fall Risk Category Calculator 0 0  0 0  Fall Risk Category (Retired) Low Low     (RETIRED) Patient Fall Risk Level Low fall risk Low fall risk Low fall risk    Patient at Risk for Falls Due to No Fall Risks No Fall Risks  No Fall Risks No Fall Risks  Fall risk Follow up Falls evaluation completed Falls evaluation completed  Falls evaluation completed Falls evaluation completed     CONSTITUTIONAL: Negative for chills, fatigue, fever, unintentional weight gain and unintentional weight loss.  E/N/T: Negative for ear pain, nasal congestion and sore throat.  CARDIOVASCULAR: Negative for chest pain, dizziness, palpitations and pedal edema.  RESPIRATORY: Negative for recent cough and dyspnea.  GASTROINTESTINAL: Negative for abdominal pain, acid reflux symptoms, constipation, diarrhea, nausea and vomiting.  MSK: see HPI INTEGUMENTARY: Negative for rash.  NEUROLOGICAL: Negative for dizziness and headaches.  PSYCHIATRIC: Negative  for sleep disturbance and to question depression screen.  Negative for depression, negative for anhedonia.       Current Outpatient Medications:    acetaminophen (TYLENOL) 500 MG tablet, Take 1,000 mg by mouth every 6 (six) hours as needed for moderate pain., Disp: , Rfl:    albuterol (PROVENTIL) (2.5 MG/3ML) 0.083% nebulizer solution, USE 1 VIAL IN NEBULIZER EVERY 4 TO 6 HOURS AS NEEDED FOR SHORTNESS OF BREATH FOR WHEEZING FOR 5 DAYS, Disp: , Rfl:    albuterol (VENTOLIN HFA) 108 (90 Base) MCG/ACT inhaler, Inhale 2 puffs into the lungs every 6 (six) hours as needed for wheezing or shortness of breath., Disp: 8 g, Rfl: 0   alprazolam (XANAX) 2 MG tablet, Take 2 mg by mouth at bedtime., Disp: , Rfl:    amLODipine (NORVASC) 5 MG tablet, Take 1 tablet (5 mg total) by mouth daily., Disp: 90 tablet, Rfl: 0    aspirin EC 81 MG tablet, Take 81 mg by mouth daily. Swallow whole., Disp: , Rfl:    bisoprolol (ZEBETA) 5 MG tablet, Take 1 tablet by mouth once daily, Disp: 90 tablet, Rfl: 0   cetirizine (ZYRTEC ALLERGY) 10 MG tablet, Take 1 tablet (10 mg total) by mouth daily., Disp: 30 tablet, Rfl: 3   esomeprazole (NEXIUM) 20 MG capsule, Take 40 mg by mouth daily., Disp: , Rfl:    ezetimibe (ZETIA) 10 MG tablet, Take 1 tablet (10 mg total) by mouth daily., Disp: 90 tablet, Rfl: 3   Fluticasone-Umeclidin-Vilant (TRELEGY ELLIPTA) 100-62.5-25 MCG/ACT AEPB, Inhale into the lungs daily as needed., Disp: , Rfl:    gabapentin (NEURONTIN) 300 MG capsule, Take 1 capsule (300 mg total) by mouth at bedtime. (Patient taking differently: Take 300 mg by mouth at bedtime as needed (pain).), Disp: 90 capsule, Rfl: 1   ketoconazole (NIZORAL) 2 % shampoo, APPLY AND WASH TOPICALLY ONCE DAILY AS NEEDED, Disp: , Rfl:    losartan (COZAAR) 50 MG tablet, Take 1 tablet (50 mg total) by mouth daily., Disp: 90 tablet, Rfl: 3   ondansetron (ZOFRAN) 4 MG tablet, Take 1 tablet (4 mg total) by mouth every 8 (eight) hours as needed for nausea or vomiting., Disp: 30 tablet, Rfl: 1   rosuvastatin (CRESTOR) 40 MG tablet, Take 1 tablet (40 mg total) by mouth daily., Disp: 90 tablet, Rfl: 3  Past Medical History:  Diagnosis Date   Acute bronchitis 03/07/2018   Acute hypoxemic respiratory failure (HCC) 03/07/2018   Acute thromboembolic cerebrovascular accident [facility])    Alcohol abuse    Amphetamine use    Anxiety    ASD (atrial septal defect) 07/19/2018   Asthma    Atypical squamous cells of undetermined significance on cytologic smear of cervix (ASC-US) 12/12/2019   Cervical strain 04/16/2014   Cervicogenic headache 04/16/2014   Chest pain, musculoskeletal 06/26/2019   Fell end of April 2021 > neg cxr 06/26/2019 with parasthesias > try zostrix    Chronic combined systolic and diastolic CHF (congestive heart failure) (HCC) 03/25/2018    Echo 02/2018: EF 40-45 // Echo 07/2018:  EF 55-60, trivial AI, small secundum ASD (L>R shunting).   Chronic pain of left knee 10/09/2021   Chronic sinusitis 09/01/2018   Onset 2005   - augmentin x 10 days 08/31/18    - 12/09/2018 referred to ENT  [place] eval >   - Sinus CT p course of augmentin started 05/24/2019 if not better   - Allergy profile 05/24/19  >  Eos 0.1/  IgE  2790  Cigarette smoker 03/07/2018   COPD (chronic obstructive pulmonary disease) (HCC)    COPD GOLD II/ active smoker  03/30/2018   Active smoker  - 03/30/2018   Walked RA  3  laps @  approx 267ft each @ avg pace  stopped due to end of study, mild sob and chest tight, no desats    - Spirometry 03/30/2018  FEV1 2.4  (94%)  Ratio 0.73 s physiologic curvature p > 4 h since last saba   - 03/30/2018    try trelegy sample to see if reduces need for saba    - 04/14/2018  After extensive coaching inhaler device,  effectiveness =    75% fr   CVA (cerebral vascular accident) [facility])    Essential hypertension    Fibromyalgia    GERD (gastroesophageal reflux disease)    Hyperlipidemia    Hypokalemia    Hypomagnesemia    Low grade squamous intraepithelial lesion (LGSIL) on cervicovaginal cytologic smear 09/12/2018   Migraines    NICM (nonischemic cardiomyopathy) (HCC) 03/25/2018   Other acute recurrent sinusitis 10/10/2021   Right leg weakness    Tobacco abuse    Vitamin B12 deficiency    Vitamin D deficiency    Objective:  PHYSICAL EXAM:   VS: BP 138/80 (BP Location: Left Arm, Patient Position: Sitting, Cuff Size: Large)   Pulse 74   Temp (!) 97.4 F (36.3 C)   Resp 14   Ht 5\' 3"  (1.6 m)   Wt 166 lb (75.3 kg)   SpO2 94%   BMI 29.41 kg/m   GEN: Well nourished, well developed, in no acute distress   Cardiac: RRR; no murmurs, rubs, or gallops,no edema -  Respiratory:  normal respiratory rate and pattern with no distress - normal breath sounds with no rales, rhonchi, wheezes or rubs  MS: no deformity or atrophy  Skin: warm and  dry, no rash  Neuro:  Alert and Oriented x 3,  - CN II-Xii grossly intact Psych: euthymic mood, appropriate affect and demeanor   Assessment & Plan:    Essential hypertension -     CBC with Differential/Platelet -     Comprehensive metabolic panel -     TSH Continue current meds - decrease caffeine - recheck bp in 3 weeks History of CVA with residual deficit Follow up with cardiology and neurology as directed Aortic atherosclerosis (HCC) -     Lipid panel Continue crestor and zetia COPD Continue albuterol and trelegy Recommend stop smoking Chronic allergic rhinitis -     Cetirizine HCl; Take 1 tablet (10 mg total) by mouth daily.  Dispense: 30 tablet; Refill: 3  Vitamin D deficiency -     VITAMIN D 25 Hydroxy (Vit-D Deficiency, Fractures)  Tobacco abuse Recommend stop smoking Hyperglycemia -     Hemoglobin A1c  PFO (patent foramen ovale) Follow up with cardiology as directed  CHRONIC left knee pain Follow up with ortho as scheduled  Need tetanus booster  Tdap given    Follow-up: Return in about 6 months (around 06/11/2023) for chronic fasting follow-up.  An After Visit Summary was printed and given to the patient.  Jettie Pagan [facility] [phone]

## 2022-12-12 LAB — LIPID PANEL
Chol/HDL Ratio: 3.2 ratio (ref 0.0–4.4)
Cholesterol, Total: 136 mg/dL (ref 100–199)
HDL: 42 mg/dL (ref >39)
LDL Chol Calc (NIH): 66 mg/dL (ref 0–99)
Triglycerides: 165 mg/dL — ABNORMAL HIGH (ref 0–149)
VLDL Cholesterol Cal: 28 mg/dL (ref 5–40)

## 2022-12-12 LAB — CBC WITH DIFFERENTIAL/PLATELET
Basophils Absolute: 0 10*3/uL (ref 0.0–0.2)
Basos: 0 %
EOS (ABSOLUTE): 0.1 10*3/uL (ref 0.0–0.4)
Eos: 1 %
Hematocrit: 40.8 % (ref 34.0–46.6)
Hemoglobin: 13 g/dL (ref 11.1–15.9)
Immature Grans (Abs): 0 10*3/uL (ref 0.0–0.1)
Immature Granulocytes: 0 %
Lymphocytes Absolute: 2.4 10*3/uL (ref 0.7–3.1)
Lymphs: 36 %
MCH: 28.8 pg (ref 26.6–33.0)
MCHC: 31.9 g/dL (ref 31.5–35.7)
MCV: 90 fL (ref 79–97)
Monocytes Absolute: 0.6 10*3/uL (ref 0.1–0.9)
Monocytes: 9 %
Neutrophils Absolute: 3.7 10*3/uL (ref 1.4–7.0)
Neutrophils: 54 %
Platelets: 365 10*3/uL (ref 150–450)
RBC: 4.52 x10E6/uL (ref 3.77–5.28)
RDW: 13.7 % (ref 11.7–15.4)
WBC: 6.8 10*3/uL (ref 3.4–10.8)

## 2022-12-12 LAB — HEMOGLOBIN A1C
Est. average glucose Bld gHb Est-mCnc: 137 mg/dL
Hgb A1c MFr Bld: 6.4 % — ABNORMAL HIGH (ref 4.8–5.6)

## 2022-12-12 LAB — COMPREHENSIVE METABOLIC PANEL
ALT: 26 [IU]/L (ref 0–32)
AST: 25 [IU]/L (ref 0–40)
Albumin: 4.1 g/dL (ref 3.8–4.9)
Alkaline Phosphatase: 96 [IU]/L (ref 44–121)
BUN/Creatinine Ratio: 12 (ref 9–23)
BUN: 10 mg/dL (ref 6–24)
Bilirubin Total: 0.4 mg/dL (ref 0.0–1.2)
CO2: 26 mmol/L (ref 20–29)
Calcium: 9.3 mg/dL (ref 8.7–10.2)
Chloride: 99 mmol/L (ref 96–106)
Creatinine, Ser: 0.85 mg/dL (ref 0.57–1.00)
Globulin, Total: 2.5 g/dL (ref 1.5–4.5)
Glucose: 123 mg/dL — ABNORMAL HIGH (ref 70–99)
Potassium: 3.5 mmol/L (ref 3.5–5.2)
Sodium: 141 mmol/L (ref 134–144)
Total Protein: 6.6 g/dL (ref 6.0–8.5)
eGFR: 79 mL/min/{1.73_m2} (ref >59)

## 2022-12-12 LAB — CUP PACEART REMOTE DEVICE CHECK
Date Time Interrogation Session: 20241026045413
Implantable Pulse Generator Implant Date: 20240610
Zone Setting Status: 755011
Zone Setting Status: 755011
Zone Setting Status: 755011
Zone Setting Status: 755011

## 2022-12-12 LAB — VITAMIN D 25 HYDROXY (VIT D DEFICIENCY, FRACTURES): Vit D, 25-Hydroxy: 48.7 ng/mL (ref 30.0–100.0)

## 2022-12-12 LAB — TSH: TSH: 1.26 u[IU]/mL (ref 0.450–4.500)

## 2022-12-14 ENCOUNTER — Ambulatory Visit (INDEPENDENT_AMBULATORY_CARE_PROVIDER_SITE_OTHER): Payer: BC Managed Care – PPO

## 2022-12-14 ENCOUNTER — Telehealth: Payer: Self-pay

## 2022-12-14 DIAGNOSIS — I639 Cerebral infarction, unspecified: Secondary | ICD-10-CM

## 2022-12-14 NOTE — Telephone Encounter (Signed)
Following alert received from [employer] received for AF occurred 10/25 @ 23:20, duration 4hrs , mean HR 140. Burden 0.5%, no hx of AF noted in EPIC, no OAC - route to triage high alert per protocol.  Attempted to contact patient to assess and advise AF clinic referral. No answer, LMTCB.

## 2022-12-15 NOTE — Telephone Encounter (Signed)
I was able to contact the requesting provider's office for Mary Franco knee surgery and advised them to cancel due to new evolving symptoms.  They are aware and have counseled her procedure for tomorrow.

## 2022-12-15 NOTE — Telephone Encounter (Signed)
Routing this to general cardiology to make aware new onset AF. Gen cards just cleared patient for surgery tomorrow, need to know if this changes clearance status.   Forwarding urgently to gen card team.  Patient scheduled tomorrow for knee arthroscopy.

## 2022-12-15 NOTE — Telephone Encounter (Signed)
Patient reports feeling weak and noticing increase in BLE edema on day of episode.  She is agreeable to being seen in the AF clinic, referral to be placed.  NOTE: patient states she has Knee arthroscopic surgery tomorrow 10/30 and will be unable to travel out to appts for at least 1 week.   Patient knows to expect a call from the AF clinic to establish.

## 2022-12-15 NOTE — Telephone Encounter (Signed)
Appt scheduled for 10/30 afib clinic

## 2022-12-16 ENCOUNTER — Other Ambulatory Visit: Payer: Self-pay

## 2022-12-16 ENCOUNTER — Encounter: Payer: Self-pay | Admitting: Internal Medicine

## 2022-12-16 ENCOUNTER — Ambulatory Visit (HOSPITAL_COMMUNITY)
Admission: RE | Admit: 2022-12-16 | Discharge: 2022-12-16 | Disposition: A | Discharge status: Home / Self Care | Payer: BC Managed Care – PPO | Source: Ambulatory Visit | Attending: Physician Assistant | Admitting: Physician Assistant

## 2022-12-16 VITALS — BP 132/74 | HR 73 | Ht 63.0 in | Wt 162.4 lb

## 2022-12-16 DIAGNOSIS — Z7901 Long term (current) use of anticoagulants: Secondary | ICD-10-CM | POA: Insufficient documentation

## 2022-12-16 DIAGNOSIS — R0602 Shortness of breath: Secondary | ICD-10-CM | POA: Diagnosis not present

## 2022-12-16 DIAGNOSIS — I6529 Occlusion and stenosis of unspecified carotid artery: Secondary | ICD-10-CM | POA: Diagnosis not present

## 2022-12-16 DIAGNOSIS — I110 Hypertensive heart disease with heart failure: Secondary | ICD-10-CM | POA: Insufficient documentation

## 2022-12-16 DIAGNOSIS — Z79899 Other long term (current) drug therapy: Secondary | ICD-10-CM | POA: Insufficient documentation

## 2022-12-16 DIAGNOSIS — Q2112 Patent foramen ovale: Secondary | ICD-10-CM | POA: Diagnosis not present

## 2022-12-16 DIAGNOSIS — I480 Paroxysmal atrial fibrillation: Secondary | ICD-10-CM | POA: Diagnosis not present

## 2022-12-16 DIAGNOSIS — I5042 Chronic combined systolic (congestive) and diastolic (congestive) heart failure: Secondary | ICD-10-CM | POA: Diagnosis not present

## 2022-12-16 DIAGNOSIS — J449 Chronic obstructive pulmonary disease, unspecified: Secondary | ICD-10-CM | POA: Diagnosis not present

## 2022-12-16 DIAGNOSIS — D6869 Other thrombophilia: Secondary | ICD-10-CM

## 2022-12-16 DIAGNOSIS — I700 Atherosclerosis of aorta: Secondary | ICD-10-CM | POA: Diagnosis not present

## 2022-12-16 DIAGNOSIS — E785 Hyperlipidemia, unspecified: Secondary | ICD-10-CM | POA: Insufficient documentation

## 2022-12-16 DIAGNOSIS — Z8673 Personal history of transient ischemic attack (TIA), and cerebral infarction without residual deficits: Secondary | ICD-10-CM | POA: Diagnosis not present

## 2022-12-16 DIAGNOSIS — I10 Essential (primary) hypertension: Secondary | ICD-10-CM

## 2022-12-16 HISTORY — DX: Paroxysmal atrial fibrillation: I48.0

## 2022-12-16 HISTORY — DX: Other thrombophilia: D68.69

## 2022-12-16 MED ORDER — BISOPROLOL FUMARATE 10 MG PO TABS: 10.0000 mg | ORAL_TABLET | Freq: Every day | ORAL | 1 refills | Status: DC

## 2022-12-16 MED ORDER — AMLODIPINE BESYLATE 5 MG PO TABS: 5.0000 mg | ORAL_TABLET | Freq: Every day | ORAL | 0 refills | Status: DC

## 2022-12-16 MED ORDER — APIXABAN 5 MG PO TABS: 5.0000 mg | ORAL_TABLET | Freq: Two times a day (BID) | ORAL | 3 refills | Status: DC

## 2022-12-16 NOTE — Patient Instructions (Signed)
Start Eliquis 5mg  twice a day   Increase bisoprolol to 10mg  once a day

## 2022-12-16 NOTE — Progress Notes (Signed)
Primary Care Physician: Marianne Sofia, PA-C Primary Cardiologist: Garwin Brothers, MD Electrophysiologist: None  Referring Physician: Device clinic   Mary Franco is a 57 y.o. female with a history of PFO, aortic atherosclerosis, carotid stenosis, COPD, HLD, HTN, CVA, atrial fibrillation who presents for follow up in the [facility]. She woke up 02/08/2022 with heaviness and weakness as well as numbness in her right leg. She went to the emergency room and was found to have small embolic infarct involving the left insular and parietal lobes. She was found to have carotid stenosis at 50% bilaterally but no occlusion. Echo showed normal ejection fraction. Her ILR detected new onset afib on 12/11/22. The episode lasted 4.5 hours with rates in the 140's. She does report that she felt much more fatigued that day and had lower extremity edema, now resolved. Patient has a CHADS2VASC score of 5.  On follow up today, patient is in SR. ILR shows no further episodes. She is chronically SOB with her COPD. She admits she has not been able to be very active this past year due to her CVA and knee pain.   Today, she denies symptoms of palpitations, chest pain, shortness of breath, orthopnea, PND, lower extremity edema, dizziness, presyncope, syncope, snoring, daytime somnolence, bleeding, or neurologic sequela. The patient is tolerating medications without difficulties and is otherwise without complaint today.    Atrial Fibrillation Risk Factors:  she does not have symptoms or diagnosis of sleep apnea. she does not have a history of rheumatic fever. she does have a history of alcohol use.   Atrial Fibrillation Management history:  Previous antiarrhythmic drugs: none Previous cardioversions: none Previous ablations: none Anticoagulation history: none  ROS- All systems are reviewed and negative except as per the HPI above.  Past Medical History:  Diagnosis Date   Acute  bronchitis 03/07/2018   Acute hypoxemic respiratory failure (HCC) 03/07/2018   Acute thromboembolic cerebrovascular accident [facility])    Alcohol abuse    Amphetamine use    Anxiety    ASD (atrial septal defect) 07/19/2018   Asthma    Atypical squamous cells of undetermined significance on cytologic smear of cervix (ASC-US) 12/12/2019   Cervical strain 04/16/2014   Cervicogenic headache 04/16/2014   Chest pain, musculoskeletal 06/26/2019   Fell end of April 2021 > neg cxr 06/26/2019 with parasthesias > try zostrix    Chronic combined systolic and diastolic CHF (congestive heart failure) (HCC) 03/25/2018   Echo 02/2018: EF 40-45 // Echo 07/2018:  EF 55-60, trivial AI, small secundum ASD (L>R shunting).   Chronic pain of left knee 10/09/2021   Chronic sinusitis 09/01/2018   Onset 2005   - augmentin x 10 days 08/31/18    - 12/09/2018 referred to ENT  [place] eval >   - Sinus CT p course of augmentin started 05/24/2019 if not better   - Allergy profile 05/24/19  >  Eos 0.1/  IgE  2790    Cigarette smoker 03/07/2018   COPD (chronic obstructive pulmonary disease) (HCC)    COPD GOLD II/ active smoker  03/30/2018   Active smoker  - 03/30/2018   Walked RA  3  laps @  approx 211ft each @ avg pace  stopped due to end of study, mild sob and chest tight, no desats    - Spirometry 03/30/2018  FEV1 2.4  (94%)  Ratio 0.73 s physiologic curvature p > 4 h since last saba   - 03/30/2018  try trelegy sample to see if reduces need for saba    - 04/14/2018  After extensive coaching inhaler device,  effectiveness =    75% fr   CVA (cerebral vascular accident) [facility])    Essential hypertension    Fibromyalgia    GERD (gastroesophageal reflux disease)    Hyperlipidemia    Hypokalemia    Hypomagnesemia    Low grade squamous intraepithelial lesion (LGSIL) on cervicovaginal cytologic smear 09/12/2018   Migraines    NICM (nonischemic cardiomyopathy) (HCC) 03/25/2018   Other acute recurrent sinusitis 10/10/2021   Right leg  weakness    Tobacco abuse    Vitamin B12 deficiency    Vitamin D deficiency     Current Outpatient Medications  Medication Sig Dispense Refill   acetaminophen (TYLENOL) 500 MG tablet Take 1,000 mg by mouth every 6 (six) hours as needed for moderate pain.     albuterol (PROVENTIL) (2.5 MG/3ML) 0.083% nebulizer solution USE 1 VIAL IN NEBULIZER EVERY 4 TO 6 HOURS AS NEEDED FOR SHORTNESS OF BREATH FOR WHEEZING FOR 5 DAYS     albuterol (VENTOLIN HFA) 108 (90 Base) MCG/ACT inhaler Inhale 2 puffs into the lungs every 6 (six) hours as needed for wheezing or shortness of breath. 8 g 0   alprazolam (XANAX) 2 MG tablet Take 2 mg by mouth at bedtime.     amLODipine (NORVASC) 5 MG tablet Take 1 tablet (5 mg total) by mouth daily. 90 tablet 0   apixaban (ELIQUIS) 5 MG TABS tablet Take 1 tablet (5 mg total) by mouth 2 (two) times daily. 60 tablet 3   cetirizine (ZYRTEC ALLERGY) 10 MG tablet Take 1 tablet (10 mg total) by mouth daily. (Patient taking differently: Take 10 mg by mouth as needed.) 30 tablet 3   esomeprazole (NEXIUM) 20 MG capsule Take 40 mg by mouth daily.     ezetimibe (ZETIA) 10 MG tablet Take 1 tablet (10 mg total) by mouth daily. 90 tablet 3   Fluticasone-Umeclidin-Vilant (TRELEGY ELLIPTA) 100-62.5-25 MCG/ACT AEPB Inhale 1 puff into the lungs daily in the afternoon.     gabapentin (NEURONTIN) 300 MG capsule Take 1 capsule (300 mg total) by mouth at bedtime. (Patient taking differently: Take 300 mg by mouth at bedtime as needed (pain).) 90 capsule 1   ketoconazole (NIZORAL) 2 % shampoo APPLY AND WASH TOPICALLY ONCE DAILY AS NEEDED     losartan (COZAAR) 50 MG tablet Take 1 tablet (50 mg total) by mouth daily. 90 tablet 3   ondansetron (ZOFRAN) 4 MG tablet Take 1 tablet (4 mg total) by mouth every 8 (eight) hours as needed for nausea or vomiting. (Patient taking differently: Take 4 mg by mouth as needed for nausea or vomiting.) 30 tablet 1   bisoprolol (ZEBETA) 10 MG tablet Take 1 tablet (10 mg  total) by mouth daily. 90 tablet 1   rosuvastatin (CRESTOR) 40 MG tablet Take 1 tablet (40 mg total) by mouth daily. (Patient not taking: Reported on 12/16/2022) 90 tablet 3   No current facility-administered medications for this encounter.    Physical Exam: BP 132/74   Pulse 73   Ht 5\' 3"  (1.6 m)   Wt 73.7 kg   BMI 28.77 kg/m   GEN: Well nourished, well developed in no acute distress NECK: No JVD; No carotid bruits CARDIAC: Regular rate and rhythm, no murmurs, rubs, gallops RESPIRATORY:  Clear to auscultation without rales, wheezing or rhonchi  ABDOMEN: Soft, non-tender, non-distended EXTREMITIES:  No edema; No deformity  Wt Readings from Last 3 Encounters:  12/16/22 73.7 kg  12/11/22 75.3 kg  11/19/22 73.5 kg     EKG today demonstrates  SR Vent. rate 73 BPM PR interval 154 ms QRS duration 66 ms QT/QTcB 428/471 ms  TEE 02/25/22 demonstrated  1. Evidence of atrial level shunting detected by color flow Doppler.  Agitated saline contrast bubble study was positive with shunting observed  within 3-6 cardiac cycles suggestive of interatrial shunt.   2. Left ventricular ejection fraction, by estimation, is 55 to 60%. The  left ventricle has normal function.   3. Right ventricular systolic function is normal. The right ventricular  size is normal.   4. No left atrial/left atrial appendage thrombus was detected. The LAA  emptying velocity was 44 cm/s.   5. The mitral valve is normal in structure. No evidence of mitral valve  regurgitation. No evidence of mitral stenosis.   6. The aortic valve is tricuspid. Aortic valve regurgitation is not  visualized. No aortic stenosis is present.   7. There is Moderate (Grade III) plaque involving the ascending aorta and  descending aorta.    CHA2DS2-VASc Score = 5  The patient's score is based upon: CHF History: 0 HTN History: 1 Diabetes History: 0 Stroke History: 2 Vascular Disease History: 1 Age Score: 0 Gender Score: 1        ASSESSMENT AND PLAN: Paroxysmal Atrial Fibrillation (ICD10:  I48.0) The patient's CHA2DS2-VASc score is 5, indicating a 7.2% annual risk of stroke.   ILR showed a 4.5 hour episode on 10/25 General education about afib provided and questions answered. We also discussed her stroke risk and the risks and benefits of anticoagulation. Start Eliquis 5 mg BID, stop ASA Increase bisoprolol to 10 mg daily  Secondary Hypercoagulable State (ICD10:  D68.69) The patient is at significant risk for stroke/thromboembolism based upon her CHA2DS2-VASc Score of 5.  Start Apixaban (Eliquis).   HTN Stable, increase BB as above.   PFO Considering closure with h/o CVA Followed by Dr Lynnette Caffey    Follow up with Dr Tomie China within a month. Patient prefers f/u in [place].        Jorja Loa PA-C Afib Clinic [facility] [address] [place], [place] [address] [phone]

## 2022-12-29 ENCOUNTER — Other Ambulatory Visit: Payer: Self-pay

## 2022-12-30 ENCOUNTER — Ambulatory Visit: Payer: BC Managed Care – PPO | Attending: Cardiology | Admitting: Cardiology

## 2022-12-30 ENCOUNTER — Encounter: Payer: Self-pay | Admitting: Cardiology

## 2022-12-30 VITALS — BP 126/68 | HR 66 | Ht 63.0 in | Wt 164.6 lb

## 2022-12-30 DIAGNOSIS — I10 Essential (primary) hypertension: Secondary | ICD-10-CM

## 2022-12-30 DIAGNOSIS — J449 Chronic obstructive pulmonary disease, unspecified: Secondary | ICD-10-CM

## 2022-12-30 DIAGNOSIS — I480 Paroxysmal atrial fibrillation: Secondary | ICD-10-CM

## 2022-12-30 DIAGNOSIS — Q211 Atrial septal defect, unspecified: Secondary | ICD-10-CM

## 2022-12-30 MED ORDER — BISOPROLOL FUMARATE 10 MG PO TABS: 20.0000 mg | ORAL_TABLET | Freq: Every day | ORAL | 3 refills | Status: DC

## 2022-12-30 NOTE — Patient Instructions (Addendum)
Medication Instructions:  Your physician has recommended you make the following change in your medication:   Stop Amlodipine  Increase Bisoprolol Fumarate (Zebeta) to 20 mg. You will take 10 mg (2 tablets).   *If you need a refill on your cardiac medications before your next appointment, please call your pharmacy*   Lab Work: None ordered If you have labs (blood work) drawn today and your tests are completely normal, you will receive your results only by: MyChart Message (if you have MyChart) OR A paper copy in the mail If you have any lab test that is abnormal or we need to change your treatment, we will call you to review the results.   Testing/Procedures: None ordered   Follow-Up: At [facility], you and your health needs are our priority.  As part of our continuing mission to provide you with exceptional heart care, we have created designated Provider Care Teams.  These Care Teams include your primary Cardiologist (physician) and Advanced Practice Providers (APPs -  Physician Assistants and Nurse Practitioners) who all work together to provide you with the care you need, when you need it.  We recommend signing up for the patient portal called "MyChart".  Sign up information is provided on this After Visit Summary.  MyChart is used to connect with patients for Virtual Visits (Telemedicine).  Patients are able to view lab/test results, encounter notes, upcoming appointments, etc.  Non-urgent messages can be sent to your provider as well.   To learn more about what you can do with MyChart, go to [email or web address].    Your next appointment:   9 month(s)  The format for your next appointment:   In Person  Provider:   Belva Crome, MD    Other Instructions none  Important Information About Sugar

## 2022-12-30 NOTE — Progress Notes (Signed)
Cardiology Office Note:    Date:  12/30/2022   ID:  Mary Franco, DOB 1965-08-18, MRN [id number]  PCP:  Marianne Sofia, PA-C  Cardiologist:  Garwin Brothers, MD   Referring MD: Marianne Sofia, PA-C    ASSESSMENT:    1. ASD (atrial septal defect)   2. Paroxysmal atrial fibrillation (HCC)   3. Essential hypertension   4. COPD GOLD II/ active smoker     PLAN:    In order of problems listed above:  Primary prevention stressed with the patient.  Importance of compliance with diet medication stressed and patient verbalized standing. Paroxysmal atrial fibrillation:I discussed with the patient atrial fibrillation, disease process. Management and therapy including rate and rhythm control, anticoagulation benefits and potential risks were discussed extensively with the patient. Patient had multiple questions which were answered to patient's satisfaction. She has had paroxysms of A-fib.  I switched her medicine.  I discontinued amlodipine and doubled her beta-blocker.  She will keep a track of pulse blood pressure for a week and get it back to [place]. Essential hypertension: Blood pressure stable.  He will continue to monitor.  Lifestyle modification and salt intake issues discussed. Mixed dyslipidemia: On lipid-lowering medications followed by primary care. Atherosclerotic vascular disease: Diagnosed by plaque findings on TEE.  Again secondary prevention stressed. Cigarette smoker: I spent 5 minutes with the patient discussing solely about smoking. Smoking cessation was counseled. I suggested to the patient also different medications and pharmacological interventions. Patient is keen to try stopping on its own at this time. He will get back to me if he needs any further assistance in this matter. She is contemplating knee surgery which is being held because of the fact that she has gained weight.  Diet emphasized.  Weight reduction stressed.  She promises to do better.   Medication  Adjustments/Labs and Tests Ordered: Current medicines are reviewed at length with the patient today.  Concerns regarding medicines are outlined above.  No orders of the defined types were placed in this encounter.  No orders of the defined types were placed in this encounter.    No chief complaint on file.    History of Present Illness:    Mary Franco is a 57 y.o. female.  Patient has past medical history of essential hypertension, mixed dyslipidemia, atrial septal defect for stroke and paroxysmal atrial fibrillation.  She denies any problems at this time and takes care of activities of daily living.  No chest pain orthopnea or PND.  She does not exercise much.  She has gained weight according to her orthopedic doctors.  Therefore knee surgery was postponed.  At the time of my evaluation, the patient is alert awake oriented and in no distress.  Unfortunately she continues to smoke  Past Medical History:  Diagnosis Date   Acute bronchitis 03/07/2018   Acute hypoxemic respiratory failure (HCC) 03/07/2018   Acute thromboembolic cerebrovascular accident [facility])    Alcohol abuse    Amphetamine use    Anxiety    Aortic atherosclerosis (HCC) 08/04/2022   ASD (atrial septal defect) 07/19/2018   Asthma    Atypical squamous cells of undetermined significance (ASCUS) on Papanicolaou smear of cervix 12/11/2019   NEG HR HPV, 12/17/2020 NEG HR HPV     POSITIVE HR HPV 09/03/2009 NEG HR HPV, 12/17/2020 NEG HR HPV     Atypical squamous cells of undetermined significance on cytologic smear of cervix (ASC-US) 12/12/2019   Cervical strain 04/16/2014   Cervicogenic headache 04/16/2014  Chest pain, musculoskeletal 06/26/2019   Larey Seat end of April 2021 > neg cxr 06/26/2019 with parasthesias > try zostrix    Chronic allergic rhinitis 08/04/2022   Chronic combined systolic and diastolic CHF (congestive heart failure) (HCC) 03/25/2018   Echo 02/2018: EF 40-45 // Echo 07/2018:  EF 55-60, trivial AI, small  secundum ASD (L>R shunting).   Chronic pain of left knee 10/09/2021   Chronic sinusitis 09/01/2018   Onset 2005   - augmentin x 10 days 08/31/18    - 12/09/2018 referred to ENT  [place] eval >   - Sinus CT p course of augmentin started 05/24/2019 if not better   - Allergy profile 05/24/19  >  Eos 0.1/  IgE  2790    Cigarette smoker 03/07/2018   Colonization with MRSA (methicillin resistant Staphylococcus aureus) 09/21/2022   Congestive heart failure (HCC) 02/15/2018   COPD (chronic obstructive pulmonary disease) (HCC)    COPD GOLD II/ active smoker  03/30/2018   Active smoker  - 03/30/2018   Walked RA  3  laps @  approx 267ft each @ avg pace  stopped due to end of study, mild sob and chest tight, no desats    - Spirometry 03/30/2018  FEV1 2.4  (94%)  Ratio 0.73 s physiologic curvature p > 4 h since last saba   - 03/30/2018    try trelegy sample to see if reduces need for saba    - 04/14/2018  After extensive coaching inhaler device,  effectiveness =    75% fr   COPD with acute exacerbation (HCC) 08/04/2022   CVA (cerebral vascular accident) [facility])    Essential hypertension    Fibromyalgia    GERD (gastroesophageal reflux disease)    History of CVA with residual deficit 02/13/2022   Hypercoagulable state due to paroxysmal atrial fibrillation (HCC) 12/16/2022   Hyperglycemia 08/04/2022   Hyperlipidemia    Hypokalemia    Hypomagnesemia    Irritable bowel syndrome 06/11/2022   Left ear hearing loss 12/29/2018   Low grade squamous intraepithelial lesion (LGSIL) on cervicovaginal cytologic smear 09/12/2018   Major depressive disorder 09/15/2011   Migraines    Mixed dyslipidemia 02/11/2022   NICM (nonischemic cardiomyopathy) (HCC) 03/25/2018   Old complete ACL tear, left 09/21/2022   Other acute recurrent sinusitis 10/10/2021   Palpitations 02/11/2022   Panic disorder without agoraphobia 09/15/2011   Paroxysmal atrial fibrillation (HCC) 12/16/2022   PFO (patent foramen ovale) 08/04/2022   Right leg  weakness    Thromboembolic stroke (HCC) 02/11/2022   Tobacco abuse    Vitamin B12 deficiency    Vitamin D deficiency     Past Surgical History:  Procedure Laterality Date   APPENDECTOMY     BUBBLE STUDY  02/25/2022   Procedure: BUBBLE STUDY;  Surgeon: Christell Constant, MD;  Location: [facility] ENDOSCOPY;  Service: Cardiovascular;;   KNEE SURGERY Left    RIGHT/LEFT HEART CATH AND CORONARY ANGIOGRAPHY N/A 03/08/2018   Procedure: RIGHT/LEFT HEART CATH AND CORONARY ANGIOGRAPHY;  Surgeon: Runell Gess, MD;  Location: [facility] INVASIVE CV LAB;  Service: Cardiovascular;  Laterality: N/A;   TEE WITHOUT CARDIOVERSION N/A 02/25/2022   Procedure: TRANSESOPHAGEAL ECHOCARDIOGRAM (TEE);  Surgeon: Christell Constant, MD;  Location[facility] ENDOSCOPY;  Service: Cardiovascular;  Laterality: N/A;   TUBAL LIGATION      Current Medications: Current Meds  Medication Sig   acetaminophen (TYLENOL) 500 MG tablet Take 1,000 mg by mouth every 6 (six) hours as needed for moderate pain.  albuterol (PROVENTIL) (2.5 MG/3ML) 0.083% nebulizer solution USE 1 VIAL IN NEBULIZER EVERY 4 TO 6 HOURS AS NEEDED FOR SHORTNESS OF BREATH FOR WHEEZING FOR 5 DAYS   albuterol (VENTOLIN HFA) 108 (90 Base) MCG/ACT inhaler Inhale 2 puffs into the lungs every 6 (six) hours as needed for wheezing or shortness of breath.   alprazolam (XANAX) 2 MG tablet Take 2 mg by mouth at bedtime.   amLODipine (NORVASC) 5 MG tablet Take 1 tablet (5 mg total) by mouth daily.   apixaban (ELIQUIS) 5 MG TABS tablet Take 1 tablet (5 mg total) by mouth 2 (two) times daily.   bisoprolol (ZEBETA) 10 MG tablet Take 1 tablet (10 mg total) by mouth daily.   cetirizine (ZYRTEC ALLERGY) 10 MG tablet Take 1 tablet (10 mg total) by mouth daily.   esomeprazole (NEXIUM) 40 MG capsule Take 40 mg by mouth daily at 12 noon.   Fluticasone-Umeclidin-Vilant (TRELEGY ELLIPTA) 100-62.5-25 MCG/ACT AEPB Inhale 1 puff into the lungs daily in the afternoon.   gabapentin  (NEURONTIN) 300 MG capsule Take 1 capsule (300 mg total) by mouth at bedtime. (Patient taking differently: Take 300 mg by mouth at bedtime as needed (pain).)   ketoconazole (NIZORAL) 2 % shampoo Apply 1 Application topically as needed for irritation.   losartan (COZAAR) 50 MG tablet Take 1 tablet (50 mg total) by mouth daily.   ondansetron (ZOFRAN) 4 MG tablet Take 1 tablet (4 mg total) by mouth every 8 (eight) hours as needed for nausea or vomiting.   rosuvastatin (CRESTOR) 40 MG tablet Take 1 tablet (40 mg total) by mouth daily.     Allergies:   Codeine, Sulfa antibiotics, Spironolactone, and Zetia [ezetimibe]   Social History   Socioeconomic History   Marital status: Divorced    Spouse name: Not on file   Number of children: 2   Years of education: Not on file   Highest education level: Not on file  Occupational History   Occupation: Textiles  Tobacco Use   Smoking status: Every Day    Current packs/day: 0.50    Average packs/day: 1 pack/day for 87.9 years (83.9 ttl pk-yrs)    Types: Cigarettes    Start date: 21   Smokeless tobacco: Never  Vaping Use   Vaping status: Never Used  Substance and Sexual Activity   Alcohol use: Yes    Comment: 2 x per week   Drug use: No   Sexual activity: Not Currently  Other Topics Concern   Not on file  Social History Narrative   ** Merged History Encounter **   Patient is right handed.   Patient drinks 3 cups caffeine daily.   Working on 2nd shift now at her job.  08-15-14          Social Determinants of Health   Financial Resource Strain: Low Risk  (09/21/2022)   Overall Financial Resource Strain (CARDIA)    Difficulty of Paying Living Expenses: Not hard at all  Food Insecurity: No Food Insecurity (09/21/2022)   Hunger Vital Sign    Worried About Running Out of Food in the Last Year: Never true    Ran Out of Food in the Last Year: Never true  Transportation Needs: No Transportation Needs (09/21/2022)   PRAPARE - Therapist, art (Medical): No    Lack of Transportation (Non-Medical): No  Physical Activity: Inactive (09/21/2022)   Exercise Vital Sign    Days of Exercise per Week: 0 days  Minutes of Exercise per Session: 0 min  Stress: Stress Concern Present (09/21/2022)   [employer] of Occupational Health - Occupational Stress Questionnaire    Feeling of Stress : To some extent  Social Connections: Socially Isolated (09/21/2022)   Social Connection and Isolation Panel [NHANES]    Frequency of Communication with Friends and Family: More than three times a week    Frequency of Social Gatherings with Friends and Family: More than three times a week    Attends Religious Services: Never    Database administrator or Organizations: No    Attends Engineer, structural: Never    Marital Status: Divorced     Family History: The patient's family history includes Cancer in her mother; Colitis in her paternal aunt; Ovarian cancer in her maternal grandmother.  ROS:   Please see the history of present illness.    All other systems reviewed and are negative.  EKGs/Labs/Other Studies Reviewed:    The following studies were reviewed today: I discussed my findings with the patient at length   Recent Labs: 07/09/2022: Magnesium 1.3 12/11/2022: ALT 26; BUN 10; Creatinine, Ser 0.85; Hemoglobin 13.0; Platelets 365; Potassium 3.5; Sodium 141; TSH 1.260  Recent Lipid Panel    Component Value Date/Time   CHOL 136 12/11/2022 1030   TRIG 165 (H) 12/11/2022 1030   HDL 42 12/11/2022 1030   CHOLHDL 3.2 12/11/2022 1030   LDLCALC 66 12/11/2022 1030    Physical Exam:    VS:  BP 126/68   Pulse 66   Ht 5\' 3"  (1.6 m)   Wt 164 lb 9.6 oz (74.7 kg)   SpO2 95%   BMI 29.16 kg/m     Wt Readings from Last 3 Encounters:  12/30/22 164 lb 9.6 oz (74.7 kg)  12/16/22 162 lb 6.4 oz (73.7 kg)  12/11/22 166 lb (75.3 kg)     GEN: Patient is in no acute distress HEENT: Normal NECK: No JVD; No carotid  bruits LYMPHATICS: No lymphadenopathy CARDIAC: Hear sounds regular, 2/6 systolic murmur at the apex. RESPIRATORY:  Clear to auscultation without rales, wheezing or rhonchi  ABDOMEN: Soft, non-tender, non-distended MUSCULOSKELETAL:  No edema; No deformity  SKIN: Warm and dry NEUROLOGIC:  Alert and oriented x 3 PSYCHIATRIC:  Normal affect   Signed, Garwin Brothers, MD  12/30/2022 2:11 PM    [facility]

## 2023-01-01 NOTE — Progress Notes (Signed)
Carelink Summary Report / Loop Recorder 

## 2023-01-05 ENCOUNTER — Telehealth: Payer: Self-pay | Admitting: *Deleted

## 2023-01-05 NOTE — Telephone Encounter (Signed)
Pharmacy please advise on holding Eliquis prior to left knee scope with PMM scheduled for TBD. Thank you.

## 2023-01-05 NOTE — Telephone Encounter (Signed)
Pt has ILR.  No device clearance needed.

## 2023-01-05 NOTE — Telephone Encounter (Signed)
Patient with diagnosis of afib on Eliquis for anticoagulation.    Procedure: LEFT KNEE SCOPE WITH PMM  Date of procedure: TBD   CHA2DS2-VASc Score = 6   This indicates a 9.7% annual risk of stroke. The patient's score is based upon: CHF History: 1 HTN History: 1 Diabetes History: 0 Stroke History: 2 Vascular Disease History: 1 Age Score: 0 Gender Score: 1      CrCl 70 ml/min Platelet count 365  Patient stroke was > 6 month ago.   Type of anesthesia is not mentioned. Typically orthopedic procedures request a 3 day hold because of the possibility of spinal anesthesia. Prefer a 2 day hold, but would need a 3 if spinal was used. Will defer to Dr. Tomie China.   **This guidance is not considered finalized until pre-operative APP has relayed final recommendations.**

## 2023-01-05 NOTE — Telephone Encounter (Signed)
   Pre-operative Risk Assessment    Patient Name: Mary Franco  DOB: [birth year] MRN: [id number]  DATE OF LAST VISIT: 12/30/22 [facility]. [facility] DATE OF NEXT VISIT: NONE    Request for Surgical Clearance    Procedure:   LEFT KNEE SCOPE WITH PMM  Date of Surgery:  Clearance TBD                                 Surgeon:  DR. Duwayne Heck Surgeon's Group or Practice Name:  [facility] Phone number:  [phone] KERRI MAZE Fax number:  [phone]   Type of Clearance Requested:   - Medical  - Pharmacy:  Hold Aspirin and Apixaban (Eliquis)     Type of Anesthesia:   CHOICE   Additional requests/questions:    Elpidio Anis   01/05/2023, 8:50 AM

## 2023-01-05 NOTE — Telephone Encounter (Signed)
Good Morning Dr. Tomie China  We have received a surgical clearance request for Ms. Stefanek for left knee scope procedure.  She has a PMH of COPD, chronic combined CHF, cryptogenic stroke, carotid stenosis, HTN, atrial fibrillation they were seen recently in clinic on 12/30/2022. Can you please comment on surgical clearance for upcoming knee procedure. Please forward you guidance and recommendations to P CV DIV PREOP   Thank you, Robin Searing, NP

## 2023-01-12 NOTE — Telephone Encounter (Signed)
   Patient Name: Mary Franco  DOB: [birth year] MRN: [id number]  Primary Cardiologist: Garwin Brothers, MD  Chart reviewed as part of pre-operative protocol coverage. Pre-op clearance already addressed by colleagues in earlier phone notes. To summarize recommendations:  - Recent stress test was fine so I do not think he has much of an issue.  She is not at high risk.  Anticoagulation hold I agree with the pharmacist.  Finally for spinal delivered to the physicians to decide the time of hold.  Anticoagulation needs to be started as soon as possible when felt appropriate by her surgeon  -Dr. Tomie China   In regards to Eliquis hold:  Type of anesthesia is not mentioned. Typically orthopedic procedures request a 3 day hold because of the possibility of spinal anesthesia. Prefer a 2 day hold, but would need a 3 if spinal was used.   Will route this bundled recommendation to requesting provider via Epic fax function and remove from pre-op pool. Please call with questions.  Sharlene Dory, PA-C 01/12/2023, 11:45 AM

## 2023-01-13 ENCOUNTER — Other Ambulatory Visit: Payer: Self-pay

## 2023-01-13 DIAGNOSIS — R0609 Other forms of dyspnea: Secondary | ICD-10-CM

## 2023-01-13 DIAGNOSIS — J449 Chronic obstructive pulmonary disease, unspecified: Secondary | ICD-10-CM

## 2023-01-13 MED ORDER — ALBUTEROL SULFATE HFA 108 (90 BASE) MCG/ACT IN AERS
2.0000 | INHALATION_SPRAY | Freq: Four times a day (QID) | RESPIRATORY_TRACT | 0 refills | Status: DC | PRN
Start: 2023-01-13 — End: 2023-02-03

## 2023-01-13 NOTE — Telephone Encounter (Signed)
Copied from CRM [id number]. Topic: Clinical - Medication Refill >> Jan 13, 2023  2:04 PM Conni Elliot wrote: Most Recent Primary Care Visit:  Provider: Marianne Sofia  Department: [facility]  Visit Type: OFFICE VISIT  Date: 12/11/2022  Medication: Inhaler   Has the patient contacted their pharmacy? No (Agent: If no, request that the patient contact the pharmacy for the refill. If patient does not wish to contact the pharmacy document the reason why and proceed with request.) (Agent: If yes, when and what did the pharmacy advise?)  Is this the correct pharmacy for this prescription? Yes If no, delete pharmacy and type the correct one.  This is the patient's preferred pharmacy:  [facility] [address] [place] - [address] [facility] [place] [address] Phone: [phone] Fax: [phone]   Has the prescription been filled recently? Yes  Is the patient out of the medication? Yes  Has the patient been seen for an appointment in the last year OR does the patient have an upcoming appointment? Yes  Can we respond through MyChart? Yes  Agent: Please be advised that Rx refills may take up to 3 business days. We ask that you follow-up with your pharmacy.

## 2023-01-17 LAB — CUP PACEART REMOTE DEVICE CHECK
Date Time Interrogation Session: 20241130231417
Implantable Pulse Generator Implant Date: 20240610

## 2023-01-18 ENCOUNTER — Ambulatory Visit (INDEPENDENT_AMBULATORY_CARE_PROVIDER_SITE_OTHER): Payer: BC Managed Care – PPO

## 2023-01-18 DIAGNOSIS — I639 Cerebral infarction, unspecified: Secondary | ICD-10-CM | POA: Diagnosis not present

## 2023-01-20 NOTE — Telephone Encounter (Signed)
Mayford Knife from Dr. Aundria Rud office [facility] called me and asked what to do about a message that the anesthesiologist sent her. Per Lynnea Ferrier, the anesthesiologist is needing certain verbiage to be in the clearance notes.   Lynnea Ferrier has typed exactly what the anesthesiologist needs the verbiage to say:  Mary Franco 1965-11-19 anesthesia wants specifically to say that patient agreed to non surgical treatment of PFO and that she is okay for general anesthesia    I am going to forward this the pre op APP for any further notation needed.Marland Kitchen

## 2023-02-03 ENCOUNTER — Other Ambulatory Visit: Payer: Self-pay | Admitting: Physician Assistant

## 2023-02-03 DIAGNOSIS — R0609 Other forms of dyspnea: Secondary | ICD-10-CM

## 2023-02-03 DIAGNOSIS — J449 Chronic obstructive pulmonary disease, unspecified: Secondary | ICD-10-CM

## 2023-02-19 LAB — MAGNESIUM: Magnesium: 1.3 mg/dL — ABNORMAL LOW (ref 1.6–2.3)

## 2023-02-22 ENCOUNTER — Ambulatory Visit: Payer: BC Managed Care – PPO

## 2023-02-22 DIAGNOSIS — I639 Cerebral infarction, unspecified: Secondary | ICD-10-CM

## 2023-02-22 LAB — CUP PACEART REMOTE DEVICE CHECK
Date Time Interrogation Session: 20250105231923
Implantable Pulse Generator Implant Date: 20240610

## 2023-02-23 LAB — COMPREHENSIVE METABOLIC PANEL: EGFR: 65

## 2023-02-24 ENCOUNTER — Telehealth: Payer: Self-pay | Admitting: Cardiology

## 2023-02-24 NOTE — Telephone Encounter (Signed)
 Pt is requesting that her results be faxed [phone]) to pcp at [facility] [place]. She'd like a callback once its been done. Please advise

## 2023-02-24 NOTE — Telephone Encounter (Signed)
 Left message for the patient to call back.

## 2023-02-25 ENCOUNTER — Encounter: Payer: Self-pay | Admitting: Physician Assistant

## 2023-02-25 ENCOUNTER — Ambulatory Visit: Payer: BC Managed Care – PPO | Admitting: Physician Assistant

## 2023-02-25 ENCOUNTER — Other Ambulatory Visit: Payer: Self-pay | Admitting: Physician Assistant

## 2023-02-25 VITALS — BP 136/72 | HR 67 | Temp 97.4°F | Ht 63.0 in | Wt 165.0 lb

## 2023-02-25 DIAGNOSIS — R748 Abnormal levels of other serum enzymes: Secondary | ICD-10-CM | POA: Diagnosis not present

## 2023-02-25 DIAGNOSIS — E876 Hypokalemia: Secondary | ICD-10-CM

## 2023-02-25 DIAGNOSIS — R29898 Other symptoms and signs involving the musculoskeletal system: Secondary | ICD-10-CM

## 2023-02-25 DIAGNOSIS — R5383 Other fatigue: Secondary | ICD-10-CM

## 2023-02-25 LAB — CBC WITH DIFFERENTIAL/PLATELET
Basophils Absolute: 0 10*3/uL (ref 0.0–0.2)
Basos: 0 %
EOS (ABSOLUTE): 0.1 10*3/uL (ref 0.0–0.4)
Eos: 1 %
Hematocrit: 41.5 % (ref 34.0–46.6)
Hemoglobin: 13 g/dL (ref 11.1–15.9)
Immature Grans (Abs): 0 10*3/uL (ref 0.0–0.1)
Immature Granulocytes: 0 %
Lymphocytes Absolute: 2.4 10*3/uL (ref 0.7–3.1)
Lymphs: 30 %
MCH: 27 pg (ref 26.6–33.0)
MCHC: 31.3 g/dL — ABNORMAL LOW (ref 31.5–35.7)
MCV: 86 fL (ref 79–97)
Monocytes Absolute: 0.5 10*3/uL (ref 0.1–0.9)
Monocytes: 7 %
Neutrophils Absolute: 4.9 10*3/uL (ref 1.4–7.0)
Neutrophils: 62 %
Platelets: 393 10*3/uL (ref 150–450)
RBC: 4.81 x10E6/uL (ref 3.77–5.28)
RDW: 13.7 % (ref 11.7–15.4)
WBC: 8 10*3/uL (ref 3.4–10.8)

## 2023-02-25 LAB — COMPREHENSIVE METABOLIC PANEL
ALT: 122 [IU]/L — ABNORMAL HIGH (ref 0–32)
AST: 210 [IU]/L (ref 0–40)
Albumin: 4 g/dL (ref 3.8–4.9)
Alkaline Phosphatase: 95 [IU]/L (ref 44–121)
BUN/Creatinine Ratio: 9 (ref 9–23)
BUN: 9 mg/dL (ref 6–24)
Bilirubin Total: 0.4 mg/dL (ref 0.0–1.2)
CO2: 33 mmol/L — ABNORMAL HIGH (ref 20–29)
Calcium: 9 mg/dL (ref 8.7–10.2)
Chloride: 97 mmol/L (ref 96–106)
Creatinine, Ser: 1 mg/dL (ref 0.57–1.00)
Globulin, Total: 2.3 g/dL (ref 1.5–4.5)
Glucose: 169 mg/dL — ABNORMAL HIGH (ref 70–99)
Potassium: 2.6 mmol/L — ABNORMAL LOW (ref 3.5–5.2)
Sodium: 142 mmol/L (ref 134–144)
Total Protein: 6.3 g/dL (ref 6.0–8.5)
eGFR: 65 mL/min/{1.73_m2} (ref >59)

## 2023-02-25 LAB — ACUTE VIRAL HEPATITIS (HAV, HBV, HCV)
HCV Ab: NONREACTIVE
Hep A IgM: NEGATIVE
Hep B C IgM: NEGATIVE
Hepatitis B Surface Ag: NEGATIVE

## 2023-02-25 LAB — HCV INTERPRETATION

## 2023-02-25 LAB — MAGNESIUM: Magnesium: 1.5 mg/dL — ABNORMAL LOW (ref 1.6–2.3)

## 2023-02-25 LAB — TSH: TSH: 1.34 u[IU]/mL (ref 0.450–4.500)

## 2023-02-25 LAB — LAB REPORT - SCANNED: EGFR: 52

## 2023-02-25 MED ORDER — GABAPENTIN 300 MG PO CAPS: 300.0000 mg | ORAL_CAPSULE | Freq: Every day | ORAL | 1 refills | Status: AC

## 2023-02-25 NOTE — Telephone Encounter (Signed)
 Called patient and informed her that the results of her lab work was faxed to [facility]. Patient verbalized understanding and had no further questions at this time.

## 2023-02-25 NOTE — Progress Notes (Addendum)
 Subjective:  Patient ID: Mary Franco, female    DOB: January 11, 1966  Age: 58 y.o. MRN: [id number]  Chief Complaint  Patient presents with   Discuss lab results    HPI Pt here today for complaints of fatigue and abnormal labwork Pt was scheduled to have knee surgery in December and per her history as she was being prepped for surgery it was noted that her potassium was low and the surgery was cancelled.  She states she was supposed to call our office for follow up but never did that.  She states she started taking otc potassium supplements 2 po qd (however pt varies her history and says she takes daily and then maybe she has not taken daily because she forgets things a lot) Regardless she went back for preop labs on 02/18/23 and once again potassium was low at a level of 3 as well as her magnesium  low at 1.3 ---- she states she was not given any prescriptions and still taking otc potassium She had her labwork repeated with [facility] on 02/23/23 and potassium was noted to have gone lower to 2.6 EKG had been done on pt and has been normal Of note patient's liver enzymes were normal on 02/18/23 with AST at 39 and ALT at 23 but on 02/23/23 they increased to AST at 170 and ALT at 89 She denies abdominal pain, nausea or vomiting She actually had stopped her crestor  over a month ago.  She is taking tylenol  arthritis regularly and advised to stop that medication --- will repeat cmp and also get acute hepatitis panel     09/21/2022   11:27 AM 08/04/2022   10:53 AM 10/28/2021    7:45 AM 07/10/2020   10:01 AM  Depression screen PHQ 2/9  Decreased Interest 0 0 1 2  Down, Depressed, Hopeless 0 0 0 0  PHQ - 2 Score 0 0 1 2  Altered sleeping 0 2 0 3  Tired, decreased energy 0 3 3 1   Change in appetite 0 1 1 2   Feeling bad or failure about yourself  0  0 0  Trouble concentrating 0 0 0 0  Moving slowly or fidgety/restless 0 0 0 0  Suicidal thoughts 0 0 0 0  PHQ-9 Score 0 6 5 8   Difficult doing work/chores  Not difficult at all Somewhat difficult Somewhat difficult Not difficult at all        07/10/2020   10:01 AM 10/28/2021    7:44 AM 02/25/2022   10:59 AM 08/04/2022   10:53 AM 09/21/2022   11:27 AM  Fall Risk  Falls in the past year? 0 0  0 0  Was there an injury with Fall? 0 0  0 0  Fall Risk Category Calculator 0 0  0 0  Fall Risk Category (Retired) Low Low     (RETIRED) Patient Fall Risk Level Low fall risk Low fall risk Low fall risk    Patient at Risk for Falls Due to No Fall Risks No Fall Risks  No Fall Risks No Fall Risks  Fall risk Follow up Falls evaluation completed Falls evaluation completed  Falls evaluation completed Falls evaluation completed     ROS CONSTITUTIONAL: pt with fatigue E/N/T: Negative for ear pain, nasal congestion and sore throat.  CARDIOVASCULAR: Negative for chest pain, dizziness, palpitations and pedal edema.  RESPIRATORY: Negative for recent cough and dyspnea.  GASTROINTESTINAL: Negative for abdominal pain, acid reflux symptoms, constipation, diarrhea, nausea and vomiting.  MSK: has  myalgias and knee pain INTEGUMENTARY: Negative for rash.  NEUROLOGICAL: Negative for dizziness and headaches.     Current Outpatient Medications:    acetaminophen  (TYLENOL ) 500 MG tablet, Take 1,000 mg by mouth every 6 (six) hours as needed for moderate pain., Disp: , Rfl:    albuterol  (PROVENTIL ) (2.5 MG/3ML) 0.083% nebulizer solution, USE 1 VIAL IN NEBULIZER EVERY 4 TO 6 HOURS AS NEEDED FOR SHORTNESS OF BREATH FOR WHEEZING FOR 5 DAYS, Disp: , Rfl:    albuterol  (VENTOLIN  HFA) 108 (90 Base) MCG/ACT inhaler, INHALE 2 PUFFS BY MOUTH EVERY 6 HOURS AS NEEDED FOR WHEEZING OR SHORTNESS OF BREATH, Disp: 9 g, Rfl: 0   alprazolam  (XANAX ) 2 MG tablet, Take 2 mg by mouth at bedtime., Disp: , Rfl:    apixaban  (ELIQUIS ) 5 MG TABS tablet, Take 1 tablet (5 mg total) by mouth 2 (two) times daily., Disp: 60 tablet, Rfl: 3   bisoprolol  (ZEBETA ) 10 MG tablet, Take 2 tablets (20 mg total) by  mouth daily., Disp: 180 tablet, Rfl: 3   cetirizine  (ZYRTEC  ALLERGY) 10 MG tablet, Take 1 tablet (10 mg total) by mouth daily., Disp: 30 tablet, Rfl: 3   esomeprazole  (NEXIUM ) 40 MG capsule, Take 40 mg by mouth daily at 12 noon., Disp: , Rfl:    Fluticasone -Umeclidin-Vilant (TRELEGY ELLIPTA ) 100-62.5-25 MCG/ACT AEPB, Inhale 1 puff into the lungs daily in the afternoon., Disp: , Rfl:    ketoconazole (NIZORAL) 2 % shampoo, Apply 1 Application topically as needed for irritation., Disp: , Rfl:    losartan  (COZAAR ) 50 MG tablet, Take 1 tablet (50 mg total) by mouth daily., Disp: 90 tablet, Rfl: 3   ondansetron  (ZOFRAN ) 4 MG tablet, Take 1 tablet (4 mg total) by mouth every 8 (eight) hours as needed for nausea or vomiting., Disp: 30 tablet, Rfl: 1   gabapentin  (NEURONTIN ) 300 MG capsule, Take 1 capsule (300 mg total) by mouth at bedtime., Disp: 90 capsule, Rfl: 1  Past Medical History:  Diagnosis Date   Acute bronchitis 03/07/2018   Acute hypoxemic respiratory failure (HCC) 03/07/2018   Acute thromboembolic cerebrovascular accident [facility])    Alcohol abuse    Amphetamine use    Anxiety    Aortic atherosclerosis (HCC) 08/04/2022   ASD (atrial septal defect) 07/19/2018   Asthma    Atypical squamous cells of undetermined significance (ASCUS) on Papanicolaou smear of cervix 12/11/2019   NEG HR HPV, 12/17/2020 NEG HR HPV     POSITIVE HR HPV 09/03/2009 NEG HR HPV, 12/17/2020 NEG HR HPV     Atypical squamous cells of undetermined significance on cytologic smear of cervix (ASC-US ) 12/12/2019   Cervical strain 04/16/2014   Cervicogenic headache 04/16/2014   Chest pain, musculoskeletal 06/26/2019   Fell end of April 2021 > neg cxr 06/26/2019 with parasthesias > try zostrix    Chronic allergic rhinitis 08/04/2022   Chronic combined systolic and diastolic CHF (congestive heart failure) (HCC) 03/25/2018   Echo 02/2018: EF 40-45 // Echo 07/2018:  EF 55-60, trivial AI, small secundum ASD (L>R shunting).   Chronic  pain of left knee 10/09/2021   Chronic sinusitis 09/01/2018   Onset 2005   - augmentin  x 10 days 08/31/18    - 12/09/2018 referred to ENT  Carlie eval >   - Sinus CT p course of augmentin  started 05/24/2019 if not better   - Allergy profile 05/24/19  >  Eos 0.1/  IgE  2790    Cigarette smoker 03/07/2018   Colonization with MRSA (methicillin resistant  Staphylococcus aureus) 09/21/2022   Congestive heart failure (HCC) 02/15/2018   COPD (chronic obstructive pulmonary disease) (HCC)    COPD GOLD II/ active smoker  03/30/2018   Active smoker  - 03/30/2018   Walked RA  3  laps @  approx 288ft each @ avg pace  stopped due to end of study, mild sob and chest tight, no desats    - Spirometry 03/30/2018  FEV1 2.4  (94%)  Ratio 0.73 s physiologic curvature p > 4 h since last saba   - 03/30/2018    try trelegy sample to see if reduces need for saba    - 04/14/2018  After extensive coaching inhaler device,  effectiveness =    75% fr   COPD with acute exacerbation (HCC) 08/04/2022   CVA (cerebral vascular accident) [facility])    Essential hypertension    Fibromyalgia    GERD (gastroesophageal reflux disease)    History of CVA with residual deficit 02/13/2022   Hypercoagulable state due to paroxysmal atrial fibrillation (HCC) 12/16/2022   Hyperglycemia 08/04/2022   Hyperlipidemia    Hypokalemia    Hypomagnesemia    Irritable bowel syndrome 06/11/2022   Left ear hearing loss 12/29/2018   Low grade squamous intraepithelial lesion (LGSIL) on cervicovaginal cytologic smear 09/12/2018   Major depressive disorder 09/15/2011   Migraines    Mixed dyslipidemia 02/11/2022   NICM (nonischemic cardiomyopathy) (HCC) 03/25/2018   Old complete ACL tear, left 09/21/2022   Other acute recurrent sinusitis 10/10/2021   Palpitations 02/11/2022   Panic disorder without agoraphobia 09/15/2011   Paroxysmal atrial fibrillation (HCC) 12/16/2022   PFO (patent foramen ovale) 08/04/2022   Right leg weakness    Thromboembolic stroke (HCC)  02/11/2022   Tobacco abuse    Vitamin B12 deficiency    Vitamin D  deficiency    Objective:  PHYSICAL EXAM:   BP 136/72 (BP Location: Left Arm, Patient Position: Sitting)   Pulse 67   Temp (!) 97.4 F (36.3 C) (Temporal)   Ht 5' 3 (1.6 m)   Wt 165 lb (74.8 kg)   SpO2 95%   BMI 29.23 kg/m    GEN: Well nourished, well developed, in no acute distress   Cardiac: RRR; no murmurs, rubs, or gallops,no edema -  Respiratory:  normal respiratory rate and pattern with no distress - normal breath sounds with no rales, rhonchi, wheezes or rubs GI: normal bowel sounds, no masses or tenderness MS: no deformity or atrophy  Skin: warm and dry, no rash  Psych: euthymic mood, appropriate affect and demeanor  Assessment & Plan:    Other fatigue -     CBC with Differential/Platelet -     Comprehensive metabolic panel -     TSH -     Magnesium   Elevated liver enzymes -     Acute Viral Hepatitis (HAV, HBV, HCV)  Hypokalemia -     Comprehensive metabolic panel  Right leg weakness -     Gabapentin ; Take 1 capsule (300 mg total) by mouth at bedtime.  Dispense: 90 capsule; Refill: 1    ALL LABWORK ORDERED AS STAT AND WILL TREAT ACCORDING TO RESULTS STOP TYLENOL  ARTHRITIS Follow-up: Return in about 3 weeks (around 03/18/2023) for follow-up.  An After Visit Summary was printed and given to the patient.  CAMIE JONELLE NICHOLAUS DEVONNA [facility] [phone]

## 2023-02-27 LAB — LAB REPORT - SCANNED
Calcium: 8.3
EGFR: 74
HM Hepatitis Screen: NEGATIVE
PTH: 25

## 2023-02-28 ENCOUNTER — Other Ambulatory Visit: Payer: Self-pay | Admitting: Physician Assistant

## 2023-02-28 DIAGNOSIS — R0609 Other forms of dyspnea: Secondary | ICD-10-CM

## 2023-02-28 DIAGNOSIS — J449 Chronic obstructive pulmonary disease, unspecified: Secondary | ICD-10-CM

## 2023-03-04 ENCOUNTER — Ambulatory Visit: Payer: BC Managed Care – PPO

## 2023-03-04 DIAGNOSIS — R5383 Other fatigue: Secondary | ICD-10-CM

## 2023-03-04 DIAGNOSIS — Z09 Encounter for follow-up examination after completed treatment for conditions other than malignant neoplasm: Secondary | ICD-10-CM

## 2023-03-04 DIAGNOSIS — E876 Hypokalemia: Secondary | ICD-10-CM | POA: Diagnosis not present

## 2023-03-04 DIAGNOSIS — R7303 Prediabetes: Secondary | ICD-10-CM

## 2023-03-04 HISTORY — DX: Other fatigue: R53.83

## 2023-03-04 HISTORY — DX: Encounter for follow-up examination after completed treatment for conditions other than malignant neoplasm: Z09

## 2023-03-04 HISTORY — DX: Prediabetes: R73.03

## 2023-03-04 NOTE — Progress Notes (Signed)
Subjective:  Patient ID: Mary Franco, female    DOB: 04-11-1965  Age: 58 y.o. MRN: [id number]  Chief Complaint  Patient presents with   hosptial follow up    HPI   The patient presents with persistent weakness, fatigue, and generalized body aches, particularly in the legs and groin area. She describes the discomfort as soreness rather than cramping, and notes that it intensifies with physical activity such as walking. She also reports nocturnal leg cramps and suspects she may benefit from treatment for restless leg syndrome.  The patient has a history of low potassium and magnesium levels, which were identified during a recent visit with her primary care provider. She was subsequently admitted to the hospital FROM 02/26/23 TO 02/27/23 for intravenous replacement of these electrolytes and was discharged with oral supplements. Despite this intervention, the patient continues to experience weakness and fatigue.  The patient also reports frequent diarrhea, which has been a chronic issue for at least 20 years. She notes that consumption of certain foods, such as ice cream, can exacerbate this symptom. She has a history of undergoing multiple upper GI endoscopies and colonoscopies, with findings of a hernia and polyps on the colon.  The patient has a history of knee issues, with arthroscopic surgery performed on the left knee in the past. She is currently awaiting arthroscopic surgery on the right knee, which has been delayed due to low potassium levels. She reports that both knees are currently causing significant discomfort.  The patient has a history of alcohol use but reports cessation of drinking approximately two years ago. She has a family history of kidney disease and diabetes. She also reports a borderline diabetes diagnosis and a preference for sweet foods, particularly in the evenings.  The patient is a current smoker and has expressed interest in using Chantix to aid in smoking  cessation. She reports that her insurance may cover this medication if it is deemed medically necessary.  The patient's recent blood work showed normal liver function on January 2nd, but significantly elevated liver enzymes were noted on January 9th. The cause of this sudden change is unclear. The patient denies any symptoms related to liver or kidney disease.     03/04/2023    1:41 PM 09/21/2022   11:27 AM 08/04/2022   10:53 AM 10/28/2021    7:45 AM 07/10/2020   10:01 AM  Depression screen PHQ 2/9  Decreased Interest 2 0 0 1 2  Down, Depressed, Hopeless 2 0 0 0 0  PHQ - 2 Score 4 0 0 1 2  Altered sleeping 1 0 2 0 3  Tired, decreased energy 3 0 3 3 1   Change in appetite 0 0 1 1 2   Feeling bad or failure about yourself  0 0  0 0  Trouble concentrating 0 0 0 0 0  Moving slowly or fidgety/restless 3 0 0 0 0  Suicidal thoughts 0 0 0 0 0  PHQ-9 Score 11 0 6 5 8   Difficult doing work/chores Not difficult at all Not difficult at all Somewhat difficult Somewhat difficult Not difficult at all        03/04/2023    1:41 PM  Fall Risk   Falls in the past year? 1  Number falls in past yr: 0  Injury with Fall? 1    Patient Care Team: Marianne Sofia, Cordelia Poche as PCP - General (Physician Assistant) Revankar, Aundra Dubin, MD as PCP - Cardiology (Cardiology) Marcelle Overlie, MD as Consulting Physician (Obstetrics  and Gynecology) Nyoka Cowden, MD as Consulting Physician (Pulmonary Disease) Nahser, Deloris Ping, MD as Consulting Physician (Cardiology)   Review of Systems  Constitutional:  Positive for fatigue. Negative for chills and fever.  HENT:  Negative for congestion, ear pain and sore throat.   Respiratory:  Negative for cough and shortness of breath.   Cardiovascular:  Negative for chest pain.  Gastrointestinal:  Positive for diarrhea. Negative for abdominal pain, constipation, nausea and vomiting.  Genitourinary:  Negative for dysuria and frequency.  Musculoskeletal:  Positive for arthralgias and  myalgias.  Neurological:  Negative for dizziness and headaches.  Psychiatric/Behavioral:  Negative for dysphoric mood. The patient is not nervous/anxious.     Current Outpatient Medications on File Prior to Visit  Medication Sig Dispense Refill   acetaminophen (TYLENOL) 500 MG tablet Take 1,000 mg by mouth every 6 (six) hours as needed for moderate pain.     albuterol (PROVENTIL) (2.5 MG/3ML) 0.083% nebulizer solution USE 1 VIAL IN NEBULIZER EVERY 4 TO 6 HOURS AS NEEDED FOR SHORTNESS OF BREATH FOR WHEEZING FOR 5 DAYS     albuterol (VENTOLIN HFA) 108 (90 Base) MCG/ACT inhaler INHALE 2 PUFFS BY MOUTH EVERY 6 HOURS AS NEEDED FOR WHEEZING OR SHORTNESS OF BREATH 9 g 0   alprazolam (XANAX) 2 MG tablet Take 2 mg by mouth at bedtime.     apixaban (ELIQUIS) 5 MG TABS tablet Take 1 tablet (5 mg total) by mouth 2 (two) times daily. 60 tablet 3   bisoprolol (ZEBETA) 10 MG tablet Take 2 tablets (20 mg total) by mouth daily. 180 tablet 3   cetirizine (ZYRTEC ALLERGY) 10 MG tablet Take 1 tablet (10 mg total) by mouth daily. 30 tablet 3   esomeprazole (NEXIUM) 40 MG capsule Take 40 mg by mouth daily at 12 noon.     Fluticasone-Umeclidin-Vilant (TRELEGY ELLIPTA) 100-62.5-25 MCG/ACT AEPB Inhale 1 puff into the lungs daily in the afternoon.     gabapentin (NEURONTIN) 300 MG capsule Take 1 capsule (300 mg total) by mouth at bedtime. 90 capsule 1   ketoconazole (NIZORAL) 2 % shampoo Apply 1 Application topically as needed for irritation.     losartan (COZAAR) 50 MG tablet Take 1 tablet (50 mg total) by mouth daily. 90 tablet 3   MAGNESIUM-OXIDE 400 (240 Mg) MG tablet Take 1 tablet by mouth daily.     ondansetron (ZOFRAN) 4 MG tablet Take 1 tablet (4 mg total) by mouth every 8 (eight) hours as needed for nausea or vomiting. 30 tablet 1   potassium chloride SA (KLOR-CON M) 20 MEQ tablet Take 20 mEq by mouth daily.     rosuvastatin (CRESTOR) 40 MG tablet Take 1 tablet by mouth daily. (Patient not taking: Reported on  03/04/2023)     No current facility-administered medications on file prior to visit.   Past Medical History:  Diagnosis Date   Acute bronchitis 03/07/2018   Acute hypoxemic respiratory failure (HCC) 03/07/2018   Acute thromboembolic cerebrovascular accident [facility])    Alcohol abuse    Amphetamine use    Anxiety    Aortic atherosclerosis (HCC) 08/04/2022   ASD (atrial septal defect) 07/19/2018   Asthma    Atypical squamous cells of undetermined significance (ASCUS) on Papanicolaou smear of cervix 12/11/2019   NEG HR HPV, 12/17/2020 NEG HR HPV     POSITIVE HR HPV 09/03/2009 NEG HR HPV, 12/17/2020 NEG HR HPV     Atypical squamous cells of undetermined significance on cytologic smear of cervix (ASC-US) 12/12/2019  Cervical strain 04/16/2014   Cervicogenic headache 04/16/2014   Chest pain, musculoskeletal 06/26/2019   Larey Seat end of April 2021 > neg cxr 06/26/2019 with parasthesias > try zostrix    Chronic allergic rhinitis 08/04/2022   Chronic combined systolic and diastolic CHF (congestive heart failure) (HCC) 03/25/2018   Echo 02/2018: EF 40-45 // Echo 07/2018:  EF 55-60, trivial AI, small secundum ASD (L>R shunting).   Chronic pain of left knee 10/09/2021   Chronic sinusitis 09/01/2018   Onset 2005   - augmentin x 10 days 08/31/18    - 12/09/2018 referred to ENT  [place] eval >   - Sinus CT p course of augmentin started 05/24/2019 if not better   - Allergy profile 05/24/19  >  Eos 0.1/  IgE  2790    Cigarette smoker 03/07/2018   Colonization with MRSA (methicillin resistant Staphylococcus aureus) 09/21/2022   Congestive heart failure (HCC) 02/15/2018   COPD (chronic obstructive pulmonary disease) (HCC)    COPD GOLD II/ active smoker  03/30/2018   Active smoker  - 03/30/2018   Walked RA  3  laps @  approx 265ft each @ avg pace  stopped due to end of study, mild sob and chest tight, no desats    - Spirometry 03/30/2018  FEV1 2.4  (94%)  Ratio 0.73 s physiologic curvature p > 4 h since last saba   -  03/30/2018    try trelegy sample to see if reduces need for saba    - 04/14/2018  After extensive coaching inhaler device,  effectiveness =    75% fr   COPD with acute exacerbation (HCC) 08/04/2022   CVA (cerebral vascular accident) [facility])    Essential hypertension    Fibromyalgia    GERD (gastroesophageal reflux disease)    History of CVA with residual deficit 02/13/2022   Hypercoagulable state due to paroxysmal atrial fibrillation (HCC) 12/16/2022   Hyperglycemia 08/04/2022   Hyperlipidemia    Hypokalemia    Hypomagnesemia    Irritable bowel syndrome 06/11/2022   Left ear hearing loss 12/29/2018   Low grade squamous intraepithelial lesion (LGSIL) on cervicovaginal cytologic smear 09/12/2018   Major depressive disorder 09/15/2011   Migraines    Mixed dyslipidemia 02/11/2022   NICM (nonischemic cardiomyopathy) (HCC) 03/25/2018   Old complete ACL tear, left 09/21/2022   Other acute recurrent sinusitis 10/10/2021   Palpitations 02/11/2022   Panic disorder without agoraphobia 09/15/2011   Paroxysmal atrial fibrillation (HCC) 12/16/2022   PFO (patent foramen ovale) 08/04/2022   Right leg weakness    Thromboembolic stroke (HCC) 02/11/2022   Tobacco abuse    Vitamin B12 deficiency    Vitamin D deficiency    Past Surgical History:  Procedure Laterality Date   APPENDECTOMY     BUBBLE STUDY  02/25/2022   Procedure: BUBBLE STUDY;  Surgeon: Christell Constant, MD;  Location: [facility] ENDOSCOPY;  Service: Cardiovascular;;   KNEE SURGERY Left    RIGHT/LEFT HEART CATH AND CORONARY ANGIOGRAPHY N/A 03/08/2018   Procedure: RIGHT/LEFT HEART CATH AND CORONARY ANGIOGRAPHY;  Surgeon: Runell Gess, MD;  Location: [facility] INVASIVE CV LAB;  Service: Cardiovascular;  Laterality: N/A;   TEE WITHOUT CARDIOVERSION N/A 02/25/2022   Procedure: TRANSESOPHAGEAL ECHOCARDIOGRAM (TEE);  Surgeon: Christell Constant, MD;  Location: [facility] ENDOSCOPY;  Service: Cardiovascular;  Laterality: N/A;   TUBAL LIGATION       Family History  Problem Relation Age of Onset   Cancer Mother    Colitis Paternal Aunt  Ovarian cancer Maternal Grandmother    Social History   Socioeconomic History   Marital status: Divorced    Spouse name: Not on file   Number of children: 2   Years of education: Not on file   Highest education level: Not on file  Occupational History   Occupation: Textiles  Tobacco Use   Smoking status: Every Day    Current packs/day: 0.50    Average packs/day: 1 pack/day for 88.0 years (84.0 ttl pk-yrs)    Types: Cigarettes    Start date: 74   Smokeless tobacco: Never  Vaping Use   Vaping status: Never Used  Substance and Sexual Activity   Alcohol use: Yes    Comment: 2 x per week   Drug use: No   Sexual activity: Not Currently  Other Topics Concern   Not on file  Social History Narrative   ** Merged History Encounter **   Patient is right handed.   Patient drinks 3 cups caffeine daily.   Working on 2nd shift now at her job.  08-15-14          Social Drivers of [employer]: Low Risk  (09/21/2022)   Overall Financial Resource Strain (CARDIA)    Difficulty of Paying Living Expenses: Not hard at all  Food Insecurity: No Food Insecurity (09/21/2022)   Hunger Vital Sign    Worried About Running Out of Food in the Last Year: Never true    Ran Out of Food in the Last Year: Never true  Transportation Needs: No Transportation Needs (09/21/2022)   PRAPARE - Administrator, Civil Service (Medical): No    Lack of Transportation (Non-Medical): No  Physical Activity: Inactive (09/21/2022)   Exercise Vital Sign    Days of Exercise per Week: 0 days    Minutes of Exercise per Session: 0 min  Stress: Stress Concern Present (09/21/2022)   [employer] of Occupational Health - Occupational Stress Questionnaire    Feeling of Stress : To some extent  Social Connections: Socially Isolated (09/21/2022)   Social Connection and Isolation Panel [NHANES]     Frequency of Communication with Friends and Family: More than three times a week    Frequency of Social Gatherings with Friends and Family: More than three times a week    Attends Religious Services: Never    Database administrator or Organizations: No    Attends Engineer, structural: Never    Marital Status: Divorced    Objective:  BP 110/70   Pulse 66   Temp (!) 97.3 F (36.3 C)   Ht 5\' 3"  (1.6 m)   Wt 166 lb 9.6 oz (75.6 kg)   SpO2 98%   BMI 29.51 kg/m      03/04/2023    1:34 PM 02/25/2023    9:10 AM 12/30/2022    1:48 PM  BP/Weight  Systolic BP 110 136 126  Diastolic BP 70 72 68  Wt. (Lbs) 166.6 165 164.6  BMI 29.51 kg/m2 29.23 kg/m2 29.16 kg/m2    Physical Exam Vitals and nursing note reviewed.  Constitutional:      Appearance: Normal appearance.  HENT:     Head: Normocephalic and atraumatic.     Nose: Nose normal.     Mouth/Throat:     Mouth: Mucous membranes are moist.     Comments: Tongue coated Cardiovascular:     Rate and Rhythm: Normal rate and regular rhythm.  Pulmonary:  Effort: Pulmonary effort is normal.     Breath sounds: Normal breath sounds.  Musculoskeletal:        General: Normal range of motion.     Cervical back: Normal range of motion.  Skin:    General: Skin is warm.  Neurological:     General: No focal deficit present.     Mental Status: She is alert.  Psychiatric:        Mood and Affect: Mood normal.     Diabetic Foot Exam - Simple   No data filed      Lab Results  Component Value Date   WBC 8.0 02/25/2023   HGB 13.0 02/25/2023   HCT 41.5 02/25/2023   PLT 393 02/25/2023   GLUCOSE 169 (H) 02/25/2023   CHOL 136 12/11/2022   TRIG 165 (H) 12/11/2022   HDL 42 12/11/2022   LDLCALC 66 12/11/2022   ALT 122 (H) 02/25/2023   AST 210 (HH) 02/25/2023   NA 142 02/25/2023   K 2.6 (L) 02/25/2023   CL 97 02/25/2023   CREATININE 1.00 02/25/2023   BUN 9 02/25/2023   CO2 33 (H) 02/25/2023   TSH 1.340 02/25/2023    HGBA1C 6.4 (H) 12/11/2022      Assessment & Plan:    Hypomagnesemia Assessment & Plan: Replaced during this recent hospitalization Now on oral magnesium supplements  Will recheck levels Encourage electrolyte drink such as power gatorade zero   Orders: -     Magnesium -     Comprehensive metabolic panel -     CBC with Differential/Platelet  Hospital discharge follow-up Assessment & Plan: Chronic hypokalemia and hypomagnesemia with recent exacerbation. Symptoms include weakness, fatigue, and generalized body aches. History of recurrent hospitalizations for potassium drops over the past 15-20 years.  Unclear etiology, but cannot rule out possible malabsorption or chronic diarrhea contributing to electrolyte loss. Recent labs showed low potassium, magnesium, vitamin D, and B12 levels. Discussed risks of untreated imbalance (muscle weakness, cramps, cardiac issues) and benefits of treatment (improved energy, reduced muscle soreness). - Order repeat blood work for kidney function, liver enzymes, magnesium, and potassium levels - Continue oral potassium and magnesium supplements - Advise drinking electrolyte-rich fluids like Powerade Zero - Evaluate for potential malabsorption issues, consider referral to gastroenterology if indicated   Hypokalemia Assessment & Plan: Corrected with IV and then PO potassium supplementation. Will recheck levels as she still complains of myalgias and overall body pain  Orders: -     Magnesium -     Comprehensive metabolic panel -     CBC with Differential/Platelet  Other fatigue -     Magnesium -     Comprehensive metabolic panel -     CBC with Differential/Platelet  Borderline diabetes Assessment & Plan: Borderline diabetes, last A1C was 6.4 couple months ago.  Reduced alcohol intake significantly. Discussed importance of dietary modifications to manage blood glucose levels, risks of uncontrolled diabetes (cardiovascular issues, neuropathy),  and benefits of dietary changes (better blood sugar control, reduced complications). - Advise dietary modifications to reduce sugar intake - Monitor blood glucose levels regularly - Consider referral to a dietitian for personalized dietary advice    Smoking Cessation Desire to quit smoking, previously attempted Chantix. Insurance requires physician's demand. Discussed benefits of smoking cessation (reduced cardiovascular and respiratory disease risk) and risks of continued smoking (cancer, COPD). - Submit a demand to [employer] for Chantix coverage- can be done by pcp if not already done before - Provide smoking cessation  counseling and support  Diarrhea- Chronic diarrhea, recently exacerbated by ice cream,  UNSURE IF ANY  intolerance or other GI issues. Discussed dietary modifications to identify and avoid triggers, potential need for gastroenterology evaluation. Benefits include reduced symptoms and improved quality of life. Risks of untreated diarrhea include dehydration and electrolyte imbalance. - Advise dietary modifications to identify and avoid triggers   Knee Pain Bilateral knee pain with previous left knee arthroscopy. Significant pain and difficulty walking, possibly due to arthritis.  - Recommend over-the-counter pain relief such as acetaminophen or NSAIDs as needed  General Health Maintenance Vitamin D and B12 deficiencies, on supplements. No recent colonoscopy due to knee surgery but states she had one couple years ago and polyps were removed. NO diagnosis of celiac disease or any other malabsorption syndromes.  Discussed importance of regular screenings and maintaining vitamin levels. Benefits include early detection of potential issues and improved overall health. - Ensure continuation of vitamin D and B12 supplements - Schedule a colonoscopy post-knee surgery - Encourage regular physical activity and stretching to manage fibromyalgia symptoms  Follow-up - Follow  up after blood work results are available - Schedule follow-up appointment to discuss blood work results and next steps.  Orders: -     Hemoglobin A1c     No orders of the defined types were placed in this encounter.   Orders Placed This Encounter  Procedures   Magnesium   Comprehensive metabolic panel   CBC with Differential   Hemoglobin A1c     Follow-up: No follow-ups on file.  An After Visit Summary was printed and given to the patient.  Windell Moment, MD [facility] [phone]

## 2023-03-04 NOTE — Assessment & Plan Note (Signed)
Replaced during this recent hospitalization Now on oral magnesium supplements  Will recheck levels Encourage electrolyte drink such as power gatorade zero

## 2023-03-04 NOTE — Assessment & Plan Note (Signed)
Chronic hypokalemia and hypomagnesemia with recent exacerbation. Symptoms include weakness, fatigue, and generalized body aches. History of recurrent hospitalizations for potassium drops over the past 15-20 years.  Unclear etiology, but cannot rule out possible malabsorption or chronic diarrhea contributing to electrolyte loss. Recent labs showed low potassium, magnesium, vitamin D, and B12 levels. Discussed risks of untreated imbalance (muscle weakness, cramps, cardiac issues) and benefits of treatment (improved energy, reduced muscle soreness). - Order repeat blood work for kidney function, liver enzymes, magnesium, and potassium levels - Continue oral potassium and magnesium supplements - Advise drinking electrolyte-rich fluids like Powerade Zero - Evaluate for potential malabsorption issues, consider referral to gastroenterology if indicated

## 2023-03-04 NOTE — Assessment & Plan Note (Addendum)
Borderline diabetes, last A1C was 6.4 couple months ago.  Reduced alcohol intake significantly. Discussed importance of dietary modifications to manage blood glucose levels, risks of uncontrolled diabetes (cardiovascular issues, neuropathy), and benefits of dietary changes (better blood sugar control, reduced complications). - Advise dietary modifications to reduce sugar intake - Monitor blood glucose levels regularly - Consider referral to a dietitian for personalized dietary advice    Smoking Cessation Desire to quit smoking, previously attempted Chantix. Insurance requires physician's demand. Discussed benefits of smoking cessation (reduced cardiovascular and respiratory disease risk) and risks of continued smoking (cancer, COPD). - Submit a demand to [employer] for Chantix coverage- can be done by pcp if not already done before - Provide smoking cessation counseling and support  Diarrhea- Chronic diarrhea, recently exacerbated by ice cream,  UNSURE IF ANY  intolerance or other GI issues. Discussed dietary modifications to identify and avoid triggers, potential need for gastroenterology evaluation. Benefits include reduced symptoms and improved quality of life. Risks of untreated diarrhea include dehydration and electrolyte imbalance. - Advise dietary modifications to identify and avoid triggers   Knee Pain Bilateral knee pain with previous left knee arthroscopy. Significant pain and difficulty walking, possibly due to arthritis.  - Recommend over-the-counter pain relief such as acetaminophen or NSAIDs as needed  General Health Maintenance Vitamin D and B12 deficiencies, on supplements. No recent colonoscopy due to knee surgery but states she had one couple years ago and polyps were removed. NO diagnosis of celiac disease or any other malabsorption syndromes.  Discussed importance of regular screenings and maintaining vitamin levels. Benefits include early detection of potential  issues and improved overall health. - Ensure continuation of vitamin D and B12 supplements - Schedule a colonoscopy post-knee surgery - Encourage regular physical activity and stretching to manage fibromyalgia symptoms  Follow-up - Follow up after blood work results are available - Schedule follow-up appointment to discuss blood work results and next steps.

## 2023-03-04 NOTE — Assessment & Plan Note (Signed)
Corrected with IV and then PO potassium supplementation. Will recheck levels as she still complains of myalgias and overall body pain

## 2023-03-05 ENCOUNTER — Other Ambulatory Visit: Payer: Self-pay

## 2023-03-05 ENCOUNTER — Other Ambulatory Visit: Payer: Self-pay | Admitting: Physician Assistant

## 2023-03-05 DIAGNOSIS — I480 Paroxysmal atrial fibrillation: Secondary | ICD-10-CM

## 2023-03-05 DIAGNOSIS — R7401 Elevation of levels of liver transaminase levels: Secondary | ICD-10-CM

## 2023-03-05 LAB — CBC WITH DIFFERENTIAL/PLATELET
Basophils Absolute: 0 10*3/uL (ref 0.0–0.2)
Basos: 0 %
EOS (ABSOLUTE): 0.1 10*3/uL (ref 0.0–0.4)
Eos: 1 %
Hematocrit: 43.1 % (ref 34.0–46.6)
Hemoglobin: 13.2 g/dL (ref 11.1–15.9)
Immature Grans (Abs): 0 10*3/uL (ref 0.0–0.1)
Immature Granulocytes: 0 %
Lymphocytes Absolute: 2.2 10*3/uL (ref 0.7–3.1)
Lymphs: 25 %
MCH: 26.5 pg — ABNORMAL LOW (ref 26.6–33.0)
MCHC: 30.6 g/dL — ABNORMAL LOW (ref 31.5–35.7)
MCV: 87 fL (ref 79–97)
Monocytes Absolute: 0.6 10*3/uL (ref 0.1–0.9)
Monocytes: 6 %
Neutrophils Absolute: 6.1 10*3/uL (ref 1.4–7.0)
Neutrophils: 68 %
Platelets: 460 10*3/uL — ABNORMAL HIGH (ref 150–450)
RBC: 4.98 x10E6/uL (ref 3.77–5.28)
RDW: 14.2 % (ref 11.7–15.4)
WBC: 9 10*3/uL (ref 3.4–10.8)

## 2023-03-05 LAB — COMPREHENSIVE METABOLIC PANEL
ALT: 267 [IU]/L (ref 0–32)
AST: 361 [IU]/L (ref 0–40)
Albumin: 3.9 g/dL (ref 3.8–4.9)
Alkaline Phosphatase: 88 [IU]/L (ref 44–121)
BUN/Creatinine Ratio: 11 (ref 9–23)
BUN: 11 mg/dL (ref 6–24)
Bilirubin Total: 0.4 mg/dL (ref 0.0–1.2)
CO2: 26 mmol/L (ref 20–29)
Calcium: 9.3 mg/dL (ref 8.7–10.2)
Chloride: 96 mmol/L (ref 96–106)
Creatinine, Ser: 1.02 mg/dL — ABNORMAL HIGH (ref 0.57–1.00)
Globulin, Total: 2.3 g/dL (ref 1.5–4.5)
Glucose: 123 mg/dL — ABNORMAL HIGH (ref 70–99)
Potassium: 3.6 mmol/L (ref 3.5–5.2)
Sodium: 139 mmol/L (ref 134–144)
Total Protein: 6.2 g/dL (ref 6.0–8.5)
eGFR: 63 mL/min/{1.73_m2} (ref >59)

## 2023-03-05 LAB — MAGNESIUM: Magnesium: 1.8 mg/dL (ref 1.6–2.3)

## 2023-03-05 LAB — HEMOGLOBIN A1C
Est. average glucose Bld gHb Est-mCnc: 146 mg/dL
Hgb A1c MFr Bld: 6.7 % — ABNORMAL HIGH (ref 4.8–5.6)

## 2023-03-05 LAB — SPECIMEN STATUS REPORT

## 2023-03-05 MED ORDER — BISOPROLOL FUMARATE 10 MG PO TABS
20.0000 mg | ORAL_TABLET | Freq: Every day | ORAL | 3 refills | Status: DC
Start: 2023-03-05 — End: 2023-03-08

## 2023-03-05 NOTE — Progress Notes (Signed)
Can you please inform patient that her blood work showed normal potassium and magnesium levels which is good.  Her liver numbers have gone up.  Her blood did not show elevated white cell counts or anemia but her platelets are elevated possibly due to the elevated liver numbers or as a reaction to some infection.   Her hemoglobin A1c is now it is 6.7 and this is consistent with type 2 diabetes.  It was borderline previously.  I AM CONCERNED about her liver numbers. While it is reassuring that her Acute hepatitis panel is normal couple days ago, her AST and ALT are trending up.  Acute viral infections could cause liver elevation transiently.   I am going to order an ULTRASOUND OF HER LIVER to be done soon. Also will order liver function test to be done on Monday. If not feeling well over the weekend, recommend to go to the ED please.   INCLUDING [name] IN THE MESSAGE AS FYI.

## 2023-03-08 ENCOUNTER — Other Ambulatory Visit: Payer: Self-pay | Admitting: Physician Assistant

## 2023-03-08 ENCOUNTER — Telehealth: Payer: Self-pay

## 2023-03-08 DIAGNOSIS — I480 Paroxysmal atrial fibrillation: Secondary | ICD-10-CM

## 2023-03-08 MED ORDER — BISOPROLOL FUMARATE 10 MG PO TABS: 20.0000 mg | ORAL_TABLET | Freq: Every day | ORAL | 3 refills | Status: DC

## 2023-03-08 MED ORDER — TRELEGY ELLIPTA 100-62.5-25 MCG/ACT IN AEPB: 1.0000 | INHALATION_SPRAY | Freq: Every day | RESPIRATORY_TRACT | 0 refills | Status: DC

## 2023-03-08 NOTE — Telephone Encounter (Signed)
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 Patient stated she will come in one day this week.

## 2023-03-08 NOTE — Telephone Encounter (Signed)
Copied from CRM [id number]. Topic: General - Other >> Mar 08, 2023 12:02 PM Carlatta H wrote: Reason for CRM: Patient received a call with no message left//Please call back if needed//

## 2023-03-08 NOTE — Telephone Encounter (Signed)
Copied from CRM [id number]. Topic: Clinical - Medication Refill >> Mar 08, 2023 12:03 PM Carlatta H wrote: Most Recent Primary Care Visit:  Provider: Windell Moment  Department: [facility]  Visit Type: HOSPITAL FOLLOW UP  Date: 03/04/2023  Medication: bisoprolol (ZEBETA) 10 MG tablet [[id number]]  Has the patient contacted their pharmacy? No (Agent: If no, request that the patient contact the pharmacy for the refill. If patient does not wish to contact the pharmacy document the reason why and proceed with request.) (Agent: If yes, when and what did the pharmacy advise?)  Is this the correct pharmacy for this prescription? Yes If no, delete pharmacy and type the correct one.  This is the patient's preferred pharmacy:  [facility] [address] [place] - [address] [facility] [place] [address] Phone: [phone] Fax: [phone]     Has the prescription been filled recently? No  Is the patient out of the medication? Yes  Has the patient been seen for an appointment in the last year OR does the patient have an upcoming appointment? Yes  Can we respond through MyChart? No  Agent: Please be advised that Rx refills may take up to 3 business days. We ask that you follow-up with your pharmacy.

## 2023-03-09 ENCOUNTER — Ambulatory Visit: Payer: BC Managed Care – PPO

## 2023-03-09 DIAGNOSIS — R7401 Elevation of levels of liver transaminase levels: Secondary | ICD-10-CM

## 2023-03-10 LAB — CBC WITH DIFFERENTIAL/PLATELET
Basophils Absolute: 0 10*3/uL (ref 0.0–0.2)
Basos: 0 %
EOS (ABSOLUTE): 0.1 10*3/uL (ref 0.0–0.4)
Eos: 1 %
Hematocrit: 39.9 % (ref 34.0–46.6)
Hemoglobin: 12.5 g/dL (ref 11.1–15.9)
Immature Grans (Abs): 0 10*3/uL (ref 0.0–0.1)
Immature Granulocytes: 0 %
Lymphocytes Absolute: 2.3 10*3/uL (ref 0.7–3.1)
Lymphs: 27 %
MCH: 27.1 pg (ref 26.6–33.0)
MCHC: 31.3 g/dL — ABNORMAL LOW (ref 31.5–35.7)
MCV: 86 fL (ref 79–97)
Monocytes Absolute: 0.7 10*3/uL (ref 0.1–0.9)
Monocytes: 8 %
Neutrophils Absolute: 5.4 10*3/uL (ref 1.4–7.0)
Neutrophils: 64 %
Platelets: 401 10*3/uL (ref 150–450)
RBC: 4.62 x10E6/uL (ref 3.77–5.28)
RDW: 14.6 % (ref 11.7–15.4)
WBC: 8.5 10*3/uL (ref 3.4–10.8)

## 2023-03-10 LAB — COMPREHENSIVE METABOLIC PANEL
ALT: 143 [IU]/L — ABNORMAL HIGH (ref 0–32)
AST: 82 [IU]/L — ABNORMAL HIGH (ref 0–40)
Albumin: 3.8 g/dL (ref 3.8–4.9)
Alkaline Phosphatase: 86 [IU]/L (ref 44–121)
BUN/Creatinine Ratio: 12 (ref 9–23)
BUN: 9 mg/dL (ref 6–24)
Bilirubin Total: 0.5 mg/dL (ref 0.0–1.2)
CO2: 29 mmol/L (ref 20–29)
Calcium: 9 mg/dL (ref 8.7–10.2)
Chloride: 96 mmol/L (ref 96–106)
Creatinine, Ser: 0.78 mg/dL (ref 0.57–1.00)
Globulin, Total: 2.4 g/dL (ref 1.5–4.5)
Glucose: 122 mg/dL — ABNORMAL HIGH (ref 70–99)
Potassium: 3.4 mmol/L — ABNORMAL LOW (ref 3.5–5.2)
Sodium: 141 mmol/L (ref 134–144)
Total Protein: 6.2 g/dL (ref 6.0–8.5)
eGFR: 87 mL/min/{1.73_m2} (ref >59)

## 2023-03-11 ENCOUNTER — Other Ambulatory Visit: Payer: Self-pay | Admitting: Physician Assistant

## 2023-03-11 ENCOUNTER — Telehealth: Payer: Self-pay

## 2023-03-11 DIAGNOSIS — I10 Essential (primary) hypertension: Secondary | ICD-10-CM

## 2023-03-11 NOTE — Telephone Encounter (Signed)
EMERGEORTHO: PRE-OPERATIVE CLEARANCE: LEFT KNEE PARTIAL

## 2023-03-12 ENCOUNTER — Other Ambulatory Visit: Payer: Self-pay | Admitting: Physician Assistant

## 2023-03-15 ENCOUNTER — Ambulatory Visit (INDEPENDENT_AMBULATORY_CARE_PROVIDER_SITE_OTHER)
Admission: RE | Admit: 2023-03-15 | Discharge: 2023-03-15 | Disposition: A | Discharge status: Home / Self Care | Payer: BC Managed Care – PPO | Source: Ambulatory Visit

## 2023-03-15 DIAGNOSIS — R7401 Elevation of levels of liver transaminase levels: Secondary | ICD-10-CM | POA: Diagnosis not present

## 2023-03-18 ENCOUNTER — Encounter: Payer: Self-pay | Admitting: Physician Assistant

## 2023-03-18 ENCOUNTER — Ambulatory Visit (INDEPENDENT_AMBULATORY_CARE_PROVIDER_SITE_OTHER): Payer: BC Managed Care – PPO | Admitting: Physician Assistant

## 2023-03-18 DIAGNOSIS — E876 Hypokalemia: Secondary | ICD-10-CM | POA: Diagnosis not present

## 2023-03-18 DIAGNOSIS — J060 Acute laryngopharyngitis: Secondary | ICD-10-CM

## 2023-03-18 DIAGNOSIS — R748 Abnormal levels of other serum enzymes: Secondary | ICD-10-CM

## 2023-03-18 HISTORY — DX: Acute laryngopharyngitis: J06.0

## 2023-03-18 HISTORY — DX: Abnormal levels of other serum enzymes: R74.8

## 2023-03-18 MED ORDER — PROMETHAZINE-DM 6.25-15 MG/5ML PO SYRP: 5.0000 mL | ORAL_SOLUTION | Freq: Four times a day (QID) | ORAL | 0 refills | Status: DC | PRN

## 2023-03-18 MED ORDER — AMOXICILLIN 875 MG PO TABS: 875.0000 mg | ORAL_TABLET | Freq: Two times a day (BID) | ORAL | 0 refills | Status: AC

## 2023-03-18 NOTE — Progress Notes (Signed)
Subjective:  Patient ID: Mary Franco, female    DOB: Apr 29, 1965  Age: 58 y.o. MRN: [id number]  Chief Complaint  Patient presents with   Abnormal Lab    3 Week F/U    HPI  Pt in today for follow up of abnormal labs- elevated liver enzymes, hypokalemia and low magnesium Pt states overall she is feeling good today and voices no concerns or problems Once labwork stable she will be ready to get knee surgery that has been postponed multiple times  Pt with elevated liver enyzmes.  Acute hepatitis panel was done which was normal.  She is due to repeat labwork today She had ruq ultrasound yesterday and found to have steatosis otherwise no acute cholecystitis noted.  Per pt she already eats healthy diet.  She has established GI provider.  If enzymes still significantly elevated will recommend GI consult  Pt with low magnesium and low potassium.  She is taking daily supplements of both and due to recheck labwork  Pt complains of sinus pressure, pnd, cough and mild sore throat for the past week.  She is taking zyrtec daily but not helpful    03/04/2023    1:41 PM 09/21/2022   11:27 AM 08/04/2022   10:53 AM 10/28/2021    7:45 AM 07/10/2020   10:01 AM  Depression screen PHQ 2/9  Decreased Interest 2 0 0 1 2  Down, Depressed, Hopeless 2 0 0 0 0  PHQ - 2 Score 4 0 0 1 2  Altered sleeping 1 0 2 0 3  Tired, decreased energy 3 0 3 3 1   Change in appetite 0 0 1 1 2   Feeling bad or failure about yourself  0 0  0 0  Trouble concentrating 0 0 0 0 0  Moving slowly or fidgety/restless 3 0 0 0 0  Suicidal thoughts 0 0 0 0 0  PHQ-9 Score 11 0 6 5 8   Difficult doing work/chores Not difficult at all Not difficult at all Somewhat difficult Somewhat difficult Not difficult at all        10/28/2021    7:44 AM 02/25/2022   10:59 AM 08/04/2022   10:53 AM 09/21/2022   11:27 AM 03/04/2023    1:41 PM  Fall Risk  Falls in the past year? 0  0 0 1  Was there an injury with Fall? 0  0 0 1  Fall Risk Category  Calculator 0  0 0 2  Fall Risk Category (Retired) Low      (RETIRED) Patient Fall Risk Level Low fall risk Low fall risk     Patient at Risk for Falls Due to No Fall Risks  No Fall Risks No Fall Risks   Fall risk Follow up Falls evaluation completed  Falls evaluation completed Falls evaluation completed      ROS CONSTITUTIONAL: Negative for chills, fatigue, fever, unintentional weight gain and unintentional weight loss.  E/N/T: see HPI CARDIOVASCULAR: Negative for chest pain, dizziness, palpitations and pedal edema.  RESPIRATORY: see HPI GASTROINTESTINAL: Negative for abdominal pain, acid reflux symptoms, constipation, diarrhea, nausea and vomiting.  INTEGUMENTARY: Negative for rash.  NEUROLOGICAL: Negative for dizziness and headaches.  PSYCHIATRIC: Negative for sleep disturbance and to question depression screen.  Negative for depression, negative for anhedonia.    Current Outpatient Medications:    acetaminophen (TYLENOL) 500 MG tablet, Take 1,000 mg by mouth every 6 (six) hours as needed for moderate pain., Disp: , Rfl:    albuterol (PROVENTIL) (2.5 MG/3ML)  0.083% nebulizer solution, USE 1 VIAL IN NEBULIZER EVERY 4 TO 6 HOURS AS NEEDED FOR SHORTNESS OF BREATH FOR WHEEZING FOR 5 DAYS, Disp: , Rfl:    albuterol (VENTOLIN HFA) 108 (90 Base) MCG/ACT inhaler, INHALE 2 PUFFS BY MOUTH EVERY 6 HOURS AS NEEDED FOR WHEEZING OR SHORTNESS OF BREATH, Disp: 9 g, Rfl: 0   alprazolam (XANAX) 2 MG tablet, Take 2 mg by mouth at bedtime., Disp: , Rfl:    amoxicillin (AMOXIL) 875 MG tablet, Take 1 tablet (875 mg total) by mouth 2 (two) times daily for 10 days., Disp: 20 tablet, Rfl: 0   apixaban (ELIQUIS) 5 MG TABS tablet, Take 1 tablet (5 mg total) by mouth 2 (two) times daily., Disp: 60 tablet, Rfl: 3   bisoprolol (ZEBETA) 10 MG tablet, Take 2 tablets (20 mg total) by mouth daily., Disp: 180 tablet, Rfl: 3   cetirizine (ZYRTEC ALLERGY) 10 MG tablet, Take 1 tablet (10 mg total) by mouth daily., Disp: 30  tablet, Rfl: 3   esomeprazole (NEXIUM) 40 MG capsule, Take 40 mg by mouth daily at 12 noon., Disp: , Rfl:    Fluticasone-Umeclidin-Vilant (TRELEGY ELLIPTA) 100-62.5-25 MCG/ACT AEPB, Inhale 1 puff into the lungs daily in the afternoon., Disp: 60 each, Rfl: 0   gabapentin (NEURONTIN) 300 MG capsule, Take 1 capsule (300 mg total) by mouth at bedtime., Disp: 90 capsule, Rfl: 1   ketoconazole (NIZORAL) 2 % shampoo, Apply 1 Application topically as needed for irritation., Disp: , Rfl:    losartan (COZAAR) 50 MG tablet, Take 1 tablet (50 mg total) by mouth daily., Disp: 90 tablet, Rfl: 3   MAGNESIUM-OXIDE 400 (240 Mg) MG tablet, Take 1 tablet by mouth daily., Disp: , Rfl:    ondansetron (ZOFRAN) 4 MG tablet, Take 1 tablet (4 mg total) by mouth every 8 (eight) hours as needed for nausea or vomiting., Disp: 30 tablet, Rfl: 1   potassium chloride SA (KLOR-CON M) 20 MEQ tablet, Take 20 mEq by mouth daily., Disp: , Rfl:    promethazine-dextromethorphan (PROMETHAZINE-DM) 6.25-15 MG/5ML syrup, Take 5 mLs by mouth 4 (four) times daily as needed., Disp: 118 mL, Rfl: 0   rosuvastatin (CRESTOR) 40 MG tablet, Take 1 tablet by mouth daily., Disp: , Rfl:   Past Medical History:  Diagnosis Date   Acute bronchitis 03/07/2018   Acute hypoxemic respiratory failure (HCC) 03/07/2018   Acute thromboembolic cerebrovascular accident [facility])    Alcohol abuse    Amphetamine use    Anxiety    Aortic atherosclerosis (HCC) 08/04/2022   ASD (atrial septal defect) 07/19/2018   Asthma    Atypical squamous cells of undetermined significance (ASCUS) on Papanicolaou smear of cervix 12/11/2019   NEG HR HPV, 12/17/2020 NEG HR HPV     POSITIVE HR HPV 09/03/2009 NEG HR HPV, 12/17/2020 NEG HR HPV     Atypical squamous cells of undetermined significance on cytologic smear of cervix (ASC-US) 12/12/2019   Cervical strain 04/16/2014   Cervicogenic headache 04/16/2014   Chest pain, musculoskeletal 06/26/2019   Fell end of April 2021 > neg cxr  06/26/2019 with parasthesias > try zostrix    Chronic allergic rhinitis 08/04/2022   Chronic combined systolic and diastolic CHF (congestive heart failure) (HCC) 03/25/2018   Echo 02/2018: EF 40-45 // Echo 07/2018:  EF 55-60, trivial AI, small secundum ASD (L>R shunting).   Chronic pain of left knee 10/09/2021   Chronic sinusitis 09/01/2018   Onset 2005   - augmentin x 10 days 08/31/18    -  12/09/2018 referred to ENT  Jenne Pane eval >   - Sinus CT p course of augmentin started 05/24/2019 if not better   - Allergy profile 05/24/19  >  Eos 0.1/  IgE  2790    Cigarette smoker 03/07/2018   Colonization with MRSA (methicillin resistant Staphylococcus aureus) 09/21/2022   Congestive heart failure (HCC) 02/15/2018   COPD (chronic obstructive pulmonary disease) (HCC)    COPD GOLD II/ active smoker  03/30/2018   Active smoker  - 03/30/2018   Walked RA  3  laps @  approx 251ft each @ avg pace  stopped due to end of study, mild sob and chest tight, no desats    - Spirometry 03/30/2018  FEV1 2.4  (94%)  Ratio 0.73 s physiologic curvature p > 4 h since last saba   - 03/30/2018    try trelegy sample to see if reduces need for saba    - 04/14/2018  After extensive coaching inhaler device,  effectiveness =    75% fr   COPD with acute exacerbation (HCC) 08/04/2022   CVA (cerebral vascular accident) [facility])    Essential hypertension    Fibromyalgia    GERD (gastroesophageal reflux disease)    History of CVA with residual deficit 02/13/2022   Hypercoagulable state due to paroxysmal atrial fibrillation (HCC) 12/16/2022   Hyperglycemia 08/04/2022   Hyperlipidemia    Hypokalemia    Hypomagnesemia    Irritable bowel syndrome 06/11/2022   Left ear hearing loss 12/29/2018   Low grade squamous intraepithelial lesion (LGSIL) on cervicovaginal cytologic smear 09/12/2018   Major depressive disorder 09/15/2011   Migraines    Mixed dyslipidemia 02/11/2022   NICM (nonischemic cardiomyopathy) (HCC) 03/25/2018   Old complete ACL tear,  left 09/21/2022   Other acute recurrent sinusitis 10/10/2021   Palpitations 02/11/2022   Panic disorder without agoraphobia 09/15/2011   Paroxysmal atrial fibrillation (HCC) 12/16/2022   PFO (patent foramen ovale) 08/04/2022   Right leg weakness    Thromboembolic stroke (HCC) 02/11/2022   Tobacco abuse    Vitamin B12 deficiency    Vitamin D deficiency    Objective:  PHYSICAL EXAM:   BP 124/82 (BP Location: Left Arm, Patient Position: Sitting, Cuff Size: Normal)   Pulse 74   Temp (!) 97.3 F (36.3 C) (Temporal)   Resp 18   Ht 5\' 3"  (1.6 m)   Wt 165 lb 9.6 oz (75.1 kg)   SpO2 96%   BMI 29.33 kg/m    GEN: Well nourished, well developed, in no acute distress  HEENT: normal external ears and nose - normal external auditory canals and TMS -  - Lips, Teeth and Gums - normal  Oropharynx - erythema/pnd Cardiac: RRR; no murmurs, rubs, or gallops,no edema - Respiratory:  normal respiratory rate and pattern with no distress - normal breath sounds with no rales, rhonchi, wheezes or rubs GI: normal bowel sounds, no masses or tenderness Skin: warm and dry, no rash  Neuro:  Alert and Oriented x 3, - CN II-Xii grossly intact Psych: euthymic mood, appropriate affect and demeanor  Assessment & Plan:    Hypomagnesemia -     Magnesium  Hypokalemia -     Comprehensive metabolic panel  Elevated liver enzymes -     CBC with Differential/Platelet -     Comprehensive metabolic panel  Acute laryngopharyngitis -     Amoxicillin; Take 1 tablet (875 mg total) by mouth 2 (two) times daily for 10 days.  Dispense: 20 tablet; Refill: 0 -  Promethazine-DM; Take 5 mLs by mouth 4 (four) times daily as needed.  Dispense: 118 mL; Refill: 0     Follow-up: Return for as scheduled in April for chronic visit.  An After Visit Summary was printed and given to the patient.  Jettie Pagan [facility] [phone]

## 2023-03-19 ENCOUNTER — Telehealth: Payer: Self-pay

## 2023-03-19 LAB — CBC WITH DIFFERENTIAL/PLATELET
Basophils Absolute: 0 10*3/uL (ref 0.0–0.2)
Basos: 0 %
EOS (ABSOLUTE): 0.1 10*3/uL (ref 0.0–0.4)
Eos: 1 %
Hematocrit: 39.2 % (ref 34.0–46.6)
Hemoglobin: 12.2 g/dL (ref 11.1–15.9)
Immature Grans (Abs): 0 10*3/uL (ref 0.0–0.1)
Immature Granulocytes: 0 %
Lymphocytes Absolute: 2.3 10*3/uL (ref 0.7–3.1)
Lymphs: 24 %
MCH: 27 pg (ref 26.6–33.0)
MCHC: 31.1 g/dL — ABNORMAL LOW (ref 31.5–35.7)
MCV: 87 fL (ref 79–97)
Monocytes Absolute: 0.7 10*3/uL (ref 0.1–0.9)
Monocytes: 7 %
Neutrophils Absolute: 6.4 10*3/uL (ref 1.4–7.0)
Neutrophils: 68 %
Platelets: 399 10*3/uL (ref 150–450)
RBC: 4.52 x10E6/uL (ref 3.77–5.28)
RDW: 14.6 % (ref 11.7–15.4)
WBC: 9.5 10*3/uL (ref 3.4–10.8)

## 2023-03-19 LAB — LAB REPORT - SCANNED
Calcium: 7.7
EGFR: 85

## 2023-03-19 LAB — COMPREHENSIVE METABOLIC PANEL
ALT: 17 [IU]/L (ref 0–32)
AST: 16 [IU]/L (ref 0–40)
Albumin: 3.8 g/dL (ref 3.8–4.9)
Alkaline Phosphatase: 80 [IU]/L (ref 44–121)
BUN/Creatinine Ratio: 7 — ABNORMAL LOW (ref 9–23)
BUN: 8 mg/dL (ref 6–24)
Bilirubin Total: 0.3 mg/dL (ref 0.0–1.2)
CO2: 24 mmol/L (ref 20–29)
Calcium: 8 mg/dL — ABNORMAL LOW (ref 8.7–10.2)
Calcium: 7.8
Chloride: 101 mmol/L (ref 96–106)
Creatinine, Ser: 1.09 mg/dL — ABNORMAL HIGH (ref 0.57–1.00)
EGFR: 85
Globulin, Total: 2.2 g/dL (ref 1.5–4.5)
Glucose: 104 mg/dL — ABNORMAL HIGH (ref 70–99)
Potassium: 4 mmol/L (ref 3.5–5.2)
Sodium: 145 mmol/L — ABNORMAL HIGH (ref 134–144)
Total Protein: 6 g/dL (ref 6.0–8.5)
eGFR: 59 mL/min/{1.73_m2} — ABNORMAL LOW (ref >59)

## 2023-03-19 LAB — MAGNESIUM: Magnesium: 0.9 mg/dL — CL (ref 1.6–2.3)

## 2023-03-19 NOTE — Telephone Encounter (Signed)
Copied from CRM [id number]. Topic: Clinical - Lab/Test Results >> Mar 19, 2023  9:46 AM Prudencio Pair wrote: Reason for CRM: Corrie Dandy, with [facility], calling to give lab alert. States magnesium is at 0.9. She states she is also sending over a fax as well to clinic. Provided her with fax number to clinic.

## 2023-03-19 NOTE — Telephone Encounter (Signed)
Pt notified and referred to ED because she is symptomatic

## 2023-03-23 ENCOUNTER — Other Ambulatory Visit: Payer: Self-pay

## 2023-03-23 MED ORDER — POTASSIUM CHLORIDE CRYS ER 20 MEQ PO TBCR: 20.0000 meq | EXTENDED_RELEASE_TABLET | Freq: Every day | ORAL | 1 refills | Status: AC

## 2023-03-23 NOTE — Telephone Encounter (Signed)
Called patient and left voice for patient to call office back.

## 2023-03-23 NOTE — Telephone Encounter (Addendum)
 Patient stated that she was wanting to know if she needed a referral to GI doctor, she was seen at [facility] over the weekend and was told she needed to see GI before she has her knee surgery due to her potassium being low.  Per Camie Moats PA-C: patient needs to schedule hospital follow up for the end of next week, at that appointment she will discuss with patient if she needs a referral to GI, and will also recheck patient labs at this visit.

## 2023-03-23 NOTE — Telephone Encounter (Signed)
 Copied from CRM [id number]. Topic: Clinical - Medical Advice >> Mar 23, 2023 12:39 PM Farrel B wrote: Reason for CRM: [id number], patient is requesting someone to call her in regards of having several procedures via the GI doctor, patient is concerned about current health conditions, and need to know the recommendations with going through with the colonoscopy, and also a knee surgery has been recommended but was told she can't have the knee surgery until she has the colonoscopy to determine where all the depletion of her vitamins are going

## 2023-03-23 NOTE — Addendum Note (Signed)
Addended by: Jomarie Longs on: 03/23/2023 04:21 PM   Modules accepted: Orders

## 2023-03-25 ENCOUNTER — Inpatient Hospital Stay: Payer: BC Managed Care – PPO | Admitting: Physician Assistant

## 2023-03-29 ENCOUNTER — Ambulatory Visit (INDEPENDENT_AMBULATORY_CARE_PROVIDER_SITE_OTHER): Payer: BC Managed Care – PPO

## 2023-03-29 DIAGNOSIS — I639 Cerebral infarction, unspecified: Secondary | ICD-10-CM | POA: Diagnosis not present

## 2023-03-29 LAB — COMPREHENSIVE METABOLIC PANEL
ALT: 23 [IU]/L (ref 0–32)
AST: 39 [IU]/L (ref 0–40)
Albumin: 4 g/dL (ref 3.8–4.9)
Alkaline Phosphatase: 118 [IU]/L (ref 44–121)
BUN/Creatinine Ratio: 9 (ref 9–23)
BUN: 9 mg/dL (ref 6–24)
Bilirubin Total: <0.2 mg/dL (ref 0.0–1.2)
CO2: 27 mmol/L (ref 20–29)
Calcium: 8.9 mg/dL (ref 8.7–10.2)
Chloride: 99 mmol/L (ref 96–106)
Creatinine, Ser: 1 mg/dL (ref 0.57–1.00)
Globulin, Total: 2.4 g/dL (ref 1.5–4.5)
Glucose: 142 mg/dL — ABNORMAL HIGH (ref 70–99)
Potassium: 3 mmol/L — ABNORMAL LOW (ref 3.5–5.2)
Sodium: 143 mmol/L (ref 134–144)
Total Protein: 6.4 g/dL (ref 6.0–8.5)
eGFR: 65 mL/min/{1.73_m2} (ref >59)

## 2023-03-29 LAB — CUP PACEART REMOTE DEVICE CHECK
Date Time Interrogation Session: 20250209232218
Implantable Pulse Generator Implant Date: 20240610

## 2023-03-29 LAB — SPECIMEN STATUS REPORT

## 2023-03-31 ENCOUNTER — Other Ambulatory Visit: Payer: Self-pay | Admitting: Physician Assistant

## 2023-03-31 MED ORDER — MAGNESIUM-OXIDE 400 (240 MG) MG PO TABS: 1.0000 | ORAL_TABLET | Freq: Every day | ORAL | 3 refills | Status: DC

## 2023-03-31 NOTE — Telephone Encounter (Signed)
Last Fill: 02/27/23  Last OV: 03/18/23 Next OV: 04/05/23  Routing to provider for review/authorization.

## 2023-03-31 NOTE — Telephone Encounter (Signed)
Copied from CRM [id number]. Topic: Clinical - Medication Refill >> Mar 31, 2023 10:04 AM Antony Haste wrote: Most Recent Primary Care Visit:  Provider: Marianne Sofia  Department: [facility]  Visit Type: OFFICE VISIT  Date: 03/18/2023  Medication: MAGNESIUM-OXIDE 400 (240 Mg) MG tablet  Has the patient contacted their pharmacy? Yes (Agent: If no, request that the patient contact the pharmacy for the refill. If patient does not wish to contact the pharmacy document the reason why and proceed with request.) (Agent: If yes, when and what did the pharmacy advise?)  Is this the correct pharmacy for this prescription? Yes If no, delete pharmacy and type the correct one.  This is the patient's preferred pharmacy:   [facility] [address] [place] - [address] [facility] [place] [address] Phone: [phone] Fax: [phone]     Has the prescription been filled recently? No  Is the patient out of the medication? Yes  Has the patient been seen for an appointment in the last year OR does the patient have an upcoming appointment? Yes  Can we respond through MyChart? No  Agent: Please be advised that Rx refills may take up to 3 business days. We ask that you follow-up with your pharmacy.

## 2023-04-02 NOTE — Progress Notes (Signed)
[facility]link Summary Report / Loop Recorder

## 2023-04-05 ENCOUNTER — Encounter: Payer: Self-pay | Admitting: Physician Assistant

## 2023-04-05 ENCOUNTER — Ambulatory Visit (INDEPENDENT_AMBULATORY_CARE_PROVIDER_SITE_OTHER): Payer: BC Managed Care – PPO | Admitting: Physician Assistant

## 2023-04-05 DIAGNOSIS — R5383 Other fatigue: Secondary | ICD-10-CM

## 2023-04-05 DIAGNOSIS — E876 Hypokalemia: Secondary | ICD-10-CM

## 2023-04-05 DIAGNOSIS — E559 Vitamin D deficiency, unspecified: Secondary | ICD-10-CM

## 2023-04-05 DIAGNOSIS — Z1211 Encounter for screening for malignant neoplasm of colon: Secondary | ICD-10-CM

## 2023-04-05 DIAGNOSIS — K529 Noninfective gastroenteritis and colitis, unspecified: Secondary | ICD-10-CM

## 2023-04-05 NOTE — Progress Notes (Signed)
Subjective:  Patient ID: Mary Franco, female    DOB: 1965/07/29  Age: 58 y.o. MRN: [id number]  Chief Complaint  Patient presents with   Hospitalization Follow-up    HPI  Pt in today for follow up of abnormal labs-  hypokalemia , hypomagnesium and hypercalcemia For the past several months her labwork continues to show abnormalities in these levels despite potassium and magnesium supplements She at one time had extremely elevated liver enzymes as well with negative hepatitis panel and RUQ ultrasound showed hepatic steatosis otherwise normal  -- did have GI consult scheduled but pt deferred appt - she would like to set up appt with Dr Jennye Boroughs States she is now having daily loose bms with intermittent nausea Says she has a good appetite  Denies ETOH use but used to be heavy drinker and stopped 15 years ago Pt states overall she feels fatigued and achey     03/04/2023    1:41 PM 09/21/2022   11:27 AM 08/04/2022   10:53 AM 10/28/2021    7:45 AM 07/10/2020   10:01 AM  Depression screen PHQ 2/9  Decreased Interest 2 0 0 1 2  Down, Depressed, Hopeless 2 0 0 0 0  PHQ - 2 Score 4 0 0 1 2  Altered sleeping 1 0 2 0 3  Tired, decreased energy 3 0 3 3 1   Change in appetite 0 0 1 1 2   Feeling bad or failure about yourself  0 0  0 0  Trouble concentrating 0 0 0 0 0  Moving slowly or fidgety/restless 3 0 0 0 0  Suicidal thoughts 0 0 0 0 0  PHQ-9 Score 11 0 6 5 8   Difficult doing work/chores Not difficult at all Not difficult at all Somewhat difficult Somewhat difficult Not difficult at all        10/28/2021    7:44 AM 02/25/2022   10:59 AM 08/04/2022   10:53 AM 09/21/2022   11:27 AM 03/04/2023    1:41 PM  Fall Risk  Falls in the past year? 0  0 0 1  Was there an injury with Fall? 0  0 0 1  Fall Risk Category Calculator 0  0 0 2  Fall Risk Category (Retired) Low      (RETIRED) Patient Fall Risk Level Low fall risk Low fall risk     Patient at Risk for Falls Due to No Fall Risks  No  Fall Risks No Fall Risks   Fall risk Follow up Falls evaluation completed  Falls evaluation completed Falls evaluation completed     CONSTITUTIONAL: see HPI E/N/T: Negative for ear pain, nasal congestion and sore throat.  CARDIOVASCULAR: Negative for chest pain, dizziness, palpitations and pedal edema.  RESPIRATORY: Negative for recent cough and dyspnea.  GASTROINTESTINAL: see HPI MSK: Negative for arthralgias and myalgias.  INTEGUMENTARY: Negative for rash.  NEUROLOGICAL: Negative for dizziness and headaches.  PSYCHIATRIC: Negative for sleep disturbance and to question depression screen.  Negative for depression, negative for anhedonia.       Current Outpatient Medications:    acetaminophen (TYLENOL) 500 MG tablet, Take 1,000 mg by mouth every 6 (six) hours as needed for moderate pain., Disp: , Rfl:    albuterol (PROVENTIL) (2.5 MG/3ML) 0.083% nebulizer solution, USE 1 VIAL IN NEBULIZER EVERY 4 TO 6 HOURS AS NEEDED FOR SHORTNESS OF BREATH FOR WHEEZING FOR 5 DAYS, Disp: , Rfl:    albuterol (VENTOLIN HFA) 108 (90 Base) MCG/ACT inhaler, INHALE 2 PUFFS BY  MOUTH EVERY 6 HOURS AS NEEDED FOR WHEEZING OR SHORTNESS OF BREATH, Disp: 9 g, Rfl: 0   alprazolam (XANAX) 2 MG tablet, Take 2 mg by mouth at bedtime., Disp: , Rfl:    apixaban (ELIQUIS) 5 MG TABS tablet, Take 1 tablet (5 mg total) by mouth 2 (two) times daily., Disp: 60 tablet, Rfl: 3   bisoprolol (ZEBETA) 10 MG tablet, Take 2 tablets (20 mg total) by mouth daily., Disp: 180 tablet, Rfl: 3   cetirizine (ZYRTEC ALLERGY) 10 MG tablet, Take 1 tablet (10 mg total) by mouth daily., Disp: 30 tablet, Rfl: 3   esomeprazole (NEXIUM) 40 MG capsule, Take 40 mg by mouth daily at 12 noon., Disp: , Rfl:    Fluticasone-Umeclidin-Vilant (TRELEGY ELLIPTA) 100-62.5-25 MCG/ACT AEPB, Inhale 1 puff into the lungs daily in the afternoon., Disp: 60 each, Rfl: 0   gabapentin (NEURONTIN) 300 MG capsule, Take 1 capsule (300 mg total) by mouth at bedtime., Disp: 90  capsule, Rfl: 1   ketoconazole (NIZORAL) 2 % shampoo, Apply 1 Application topically as needed for irritation., Disp: , Rfl:    losartan (COZAAR) 50 MG tablet, Take 1 tablet (50 mg total) by mouth daily., Disp: 90 tablet, Rfl: 3   MAGNESIUM-OXIDE 400 (240 Mg) MG tablet, Take 1 tablet (400 mg total) by mouth daily., Disp: 30 tablet, Rfl: 3   ondansetron (ZOFRAN) 4 MG tablet, Take 1 tablet (4 mg total) by mouth every 8 (eight) hours as needed for nausea or vomiting., Disp: 30 tablet, Rfl: 1   potassium chloride SA (KLOR-CON M) 20 MEQ tablet, Take 1 tablet (20 mEq total) by mouth daily., Disp: 90 tablet, Rfl: 1  Past Medical History:  Diagnosis Date   Acute bronchitis 03/07/2018   Acute hypoxemic respiratory failure (HCC) 03/07/2018   Acute thromboembolic cerebrovascular accident [facility])    Alcohol abuse    Amphetamine use    Anxiety    Aortic atherosclerosis (HCC) 08/04/2022   ASD (atrial septal defect) 07/19/2018   Asthma    Atypical squamous cells of undetermined significance (ASCUS) on Papanicolaou smear of cervix 12/11/2019   NEG HR HPV, 12/17/2020 NEG HR HPV     POSITIVE HR HPV 09/03/2009 NEG HR HPV, 12/17/2020 NEG HR HPV     Atypical squamous cells of undetermined significance on cytologic smear of cervix (ASC-US) 12/12/2019   Cervical strain 04/16/2014   Cervicogenic headache 04/16/2014   Chest pain, musculoskeletal 06/26/2019   Fell end of April 2021 > neg cxr 06/26/2019 with parasthesias > try zostrix    Chronic allergic rhinitis 08/04/2022   Chronic combined systolic and diastolic CHF (congestive heart failure) (HCC) 03/25/2018   Echo 02/2018: EF 40-45 // Echo 07/2018:  EF 55-60, trivial AI, small secundum ASD (L>R shunting).   Chronic pain of left knee 10/09/2021   Chronic sinusitis 09/01/2018   Onset 2005   - augmentin x 10 days 08/31/18    - 12/09/2018 referred to ENT  Jenne Pane eval >   - Sinus CT p course of augmentin started 05/24/2019 if not better   - Allergy profile 05/24/19  >  Eos 0.1/   IgE  2790    Cigarette smoker 03/07/2018   Colonization with MRSA (methicillin resistant Staphylococcus aureus) 09/21/2022   Congestive heart failure (HCC) 02/15/2018   COPD (chronic obstructive pulmonary disease) (HCC)    COPD GOLD II/ active smoker  03/30/2018   Active smoker  - 03/30/2018   Walked RA  3  laps @  approx 2105ft each @  avg pace  stopped due to end of study, mild sob and chest tight, no desats    - Spirometry 03/30/2018  FEV1 2.4  (94%)  Ratio 0.73 s physiologic curvature p > 4 h since last saba   - 03/30/2018    try trelegy sample to see if reduces need for saba    - 04/14/2018  After extensive coaching inhaler device,  effectiveness =    75% fr   COPD with acute exacerbation (HCC) 08/04/2022   CVA (cerebral vascular accident) [facility])    Essential hypertension    Fibromyalgia    GERD (gastroesophageal reflux disease)    History of CVA with residual deficit 02/13/2022   Hypercoagulable state due to paroxysmal atrial fibrillation (HCC) 12/16/2022   Hyperglycemia 08/04/2022   Hyperlipidemia    Hypokalemia    Hypomagnesemia    Irritable bowel syndrome 06/11/2022   Left ear hearing loss 12/29/2018   Low grade squamous intraepithelial lesion (LGSIL) on cervicovaginal cytologic smear 09/12/2018   Major depressive disorder 09/15/2011   Migraines    Mixed dyslipidemia 02/11/2022   NICM (nonischemic cardiomyopathy) (HCC) 03/25/2018   Old complete ACL tear, left 09/21/2022   Other acute recurrent sinusitis 10/10/2021   Palpitations 02/11/2022   Panic disorder without agoraphobia 09/15/2011   Paroxysmal atrial fibrillation (HCC) 12/16/2022   PFO (patent foramen ovale) 08/04/2022   Right leg weakness    Thromboembolic stroke (HCC) 02/11/2022   Tobacco abuse    Vitamin B12 deficiency    Vitamin D deficiency    Objective:  PHYSICAL EXAM:   VS: BP 100/64 (BP Location: Right Arm, Patient Position: Sitting, Cuff Size: Normal)   Pulse 84   Temp 97.9 F (36.6 C) (Temporal)   Resp  18   Ht 5\' 3"  (1.6 m)   Wt 167 lb (75.8 kg)   SpO2 92%   BMI 29.58 kg/m   GEN: Well nourished, well developed, in no acute distress  Cardiac: RRR; no murmurs, rubs, or gallops,no edema - Respiratory:  normal respiratory rate and pattern with no distress - normal breath sounds with no rales, rhonchi, wheezes or rubs GI: normal bowel sounds, no masses or tenderness  Skin: warm and dry, no rash  Neuro:  Alert and Oriented x 3, - CN II-Xii grossly intact Psych: euthymic mood, appropriate affect and demeanor   Assessment & Plan:    Hypomagnesemia -     Magnesium -     Ambulatory referral to Gastroenterology -     Ambulatory referral to Nephrology Continue daily supplement Hypokalemia -     Comprehensive metabolic panel -     Ambulatory referral to Gastroenterology -     Ambulatory referral to Nephrology Continue daily supplement Other fatigue -     CBC with Differential/Platelet -     Comprehensive metabolic panel -     TSH  Vitamin D deficiency -     VITAMIN D 25 Hydroxy (Vit-D Deficiency, Fractures)  Hypercalcemia -     PTH, intact and calcium -     Ambulatory referral to Nephrology  Colon cancer screening -     Ambulatory referral to Gastroenterology  Chronic diarrhea -     Ambulatory referral to Gastroenterology     Follow-up: Return for as scheduled for next chronic visit.  An After Visit Summary was printed and given to the patient.  Jettie Pagan [facility] [phone]

## 2023-04-06 ENCOUNTER — Other Ambulatory Visit: Payer: Self-pay | Admitting: Physician Assistant

## 2023-04-06 DIAGNOSIS — E876 Hypokalemia: Secondary | ICD-10-CM

## 2023-04-06 LAB — COMPREHENSIVE METABOLIC PANEL
ALT: 9 [IU]/L (ref 0–32)
AST: 12 [IU]/L (ref 0–40)
Albumin: 4.1 g/dL (ref 3.8–4.9)
Alkaline Phosphatase: 96 [IU]/L (ref 44–121)
BUN/Creatinine Ratio: 11 (ref 9–23)
BUN: 8 mg/dL (ref 6–24)
Bilirubin Total: 0.3 mg/dL (ref 0.0–1.2)
CO2: 22 mmol/L (ref 20–29)
Calcium: 8.6 mg/dL — ABNORMAL LOW (ref 8.7–10.2)
Chloride: 100 mmol/L (ref 96–106)
Creatinine, Ser: 0.74 mg/dL (ref 0.57–1.00)
Globulin, Total: 2.3 g/dL (ref 1.5–4.5)
Glucose: 144 mg/dL — ABNORMAL HIGH (ref 70–99)
Potassium: 3.8 mmol/L (ref 3.5–5.2)
Sodium: 136 mmol/L (ref 134–144)
Total Protein: 6.4 g/dL (ref 6.0–8.5)
eGFR: 93 mL/min/{1.73_m2} (ref >59)

## 2023-04-06 LAB — CBC WITH DIFFERENTIAL/PLATELET
Basophils Absolute: 0 10*3/uL (ref 0.0–0.2)
Basos: 0 %
EOS (ABSOLUTE): 0 10*3/uL (ref 0.0–0.4)
Eos: 1 %
Hematocrit: 41.6 % (ref 34.0–46.6)
Hemoglobin: 12.9 g/dL (ref 11.1–15.9)
Immature Grans (Abs): 0 10*3/uL (ref 0.0–0.1)
Immature Granulocytes: 0 %
Lymphocytes Absolute: 1.6 10*3/uL (ref 0.7–3.1)
Lymphs: 24 %
MCH: 27.2 pg (ref 26.6–33.0)
MCHC: 31 g/dL — ABNORMAL LOW (ref 31.5–35.7)
MCV: 88 fL (ref 79–97)
Monocytes Absolute: 0.7 10*3/uL (ref 0.1–0.9)
Monocytes: 10 %
Neutrophils Absolute: 4.4 10*3/uL (ref 1.4–7.0)
Neutrophils: 65 %
Platelets: 330 10*3/uL (ref 150–450)
RBC: 4.74 x10E6/uL (ref 3.77–5.28)
RDW: 15.2 % (ref 11.7–15.4)
WBC: 6.7 10*3/uL (ref 3.4–10.8)

## 2023-04-06 LAB — TSH: TSH: 0.481 u[IU]/mL (ref 0.450–4.500)

## 2023-04-06 LAB — PTH, INTACT AND CALCIUM: PTH: 29 pg/mL (ref 15–65)

## 2023-04-06 LAB — MAGNESIUM: Magnesium: 1.2 mg/dL — ABNORMAL LOW (ref 1.6–2.3)

## 2023-04-06 LAB — VITAMIN D 25 HYDROXY (VIT D DEFICIENCY, FRACTURES): Vit D, 25-Hydroxy: 40.3 ng/mL (ref 30.0–100.0)

## 2023-04-10 ENCOUNTER — Other Ambulatory Visit: Payer: Self-pay | Admitting: Physician Assistant

## 2023-04-19 ENCOUNTER — Ambulatory Visit

## 2023-04-19 ENCOUNTER — Other Ambulatory Visit: Payer: Self-pay

## 2023-04-19 DIAGNOSIS — E876 Hypokalemia: Secondary | ICD-10-CM

## 2023-04-20 LAB — COMPREHENSIVE METABOLIC PANEL
ALT: 9 IU/L (ref 0–32)
AST: 16 IU/L (ref 0–40)
Albumin: 4 g/dL (ref 3.8–4.9)
Alkaline Phosphatase: 93 IU/L (ref 44–121)
BUN/Creatinine Ratio: 10 (ref 9–23)
BUN: 9 mg/dL (ref 6–24)
Bilirubin Total: 0.2 mg/dL (ref 0.0–1.2)
CO2: 25 mmol/L (ref 20–29)
Calcium: 8.4 mg/dL — ABNORMAL LOW (ref 8.7–10.2)
Chloride: 99 mmol/L (ref 96–106)
Creatinine, Ser: 0.88 mg/dL (ref 0.57–1.00)
Globulin, Total: 2.2 g/dL (ref 1.5–4.5)
Glucose: 116 mg/dL — ABNORMAL HIGH (ref 70–99)
Potassium: 3.8 mmol/L (ref 3.5–5.2)
Sodium: 143 mmol/L (ref 134–144)
Total Protein: 6.2 g/dL (ref 6.0–8.5)
eGFR: 76 mL/min/{1.73_m2} (ref >59)

## 2023-04-20 LAB — MAGNESIUM: Magnesium: 1 mg/dL — ABNORMAL LOW (ref 1.6–2.3)

## 2023-04-21 ENCOUNTER — Ambulatory Visit: Payer: BC Managed Care – PPO

## 2023-05-03 ENCOUNTER — Ambulatory Visit (INDEPENDENT_AMBULATORY_CARE_PROVIDER_SITE_OTHER): Payer: BC Managed Care – PPO

## 2023-05-03 ENCOUNTER — Telehealth: Payer: Self-pay | Admitting: Cardiology

## 2023-05-03 ENCOUNTER — Telehealth: Payer: Self-pay

## 2023-05-03 ENCOUNTER — Other Ambulatory Visit (HOSPITAL_COMMUNITY): Payer: Self-pay

## 2023-05-03 DIAGNOSIS — I639 Cerebral infarction, unspecified: Secondary | ICD-10-CM

## 2023-05-03 NOTE — Telephone Encounter (Signed)
 Copied from CRM [id number]. Topic: General - Other >> Apr 30, 2023  8:42 AM Dimitri Ped wrote: Reason for CRM: patient is calling cause she been out of work since April of last year . My body not keeping vitamins and the only doctor I seen is dr Earlene Plater .  [facility] have faxed papers for the doctor to sign and . Need the doctor to send any paperwork from me been seen by the doctor back to [facility] . From December until recent . Because they are no longer willing to hold on . The purpose of me not being able to get the knee surgery because I can't retain vitamins right now and I need this explained to them in detail so I can continue to be on leave .  [phone] patient call back number

## 2023-05-03 NOTE — Telephone Encounter (Signed)
 Enrolled in copay card, see separate encounter

## 2023-05-03 NOTE — Telephone Encounter (Signed)
 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 Enrolled patient in copay card. Copay card info has been faxed to [facility] pharmacy and they will contact patient once copay cost is adjusted.  Pharmacy Card Information RXBIN: [id number] PCN: [id number]: [id number] ID: [id number]

## 2023-05-03 NOTE — Telephone Encounter (Signed)
 Called patient back, made her aware we have not got any paperwork from her job, and if she can get a fax number we can send provider notes to them of why she has not been able to have the surgery due to her labs.

## 2023-05-03 NOTE — Telephone Encounter (Signed)
 Pt c/o medication issue:  1. Name of Medication:  apixaban (ELIQUIS) 5 MG TABS tablet  2. How are you currently taking this medication (dosage and times per day)?   3. Are you having a reaction (difficulty breathing--STAT)?   4. What is your medication issue?   Patient states she doesn't have any income right now and is unable to afford Eliquis. She would like to discuss other options if possible.

## 2023-05-03 NOTE — Telephone Encounter (Signed)
 Since orthopedics is who originally kept you out of work they will be the ones that will have to continue to pursue this  We can send the notes we have supporting the fact you have not been cleared

## 2023-05-04 LAB — CUP PACEART REMOTE DEVICE CHECK
Date Time Interrogation Session: 20250316231813
Implantable Pulse Generator Implant Date: 20240610

## 2023-05-04 NOTE — Progress Notes (Signed)
 Carelink Summary Report / Loop Recorder

## 2023-05-17 ENCOUNTER — Ambulatory Visit: Admitting: Physician Assistant

## 2023-05-17 ENCOUNTER — Encounter: Payer: Self-pay | Admitting: Physician Assistant

## 2023-05-17 VITALS — BP 138/86 | HR 86 | Temp 98.5°F | Resp 18 | Ht 63.0 in | Wt 162.6 lb

## 2023-05-17 DIAGNOSIS — M25562 Pain in left knee: Secondary | ICD-10-CM | POA: Diagnosis not present

## 2023-05-17 DIAGNOSIS — G8929 Other chronic pain: Secondary | ICD-10-CM

## 2023-05-17 DIAGNOSIS — E876 Hypokalemia: Secondary | ICD-10-CM

## 2023-05-17 HISTORY — DX: Hypercalcemia: E83.52

## 2023-05-17 MED ORDER — PROMETHAZINE HCL 25 MG PO TABS: 25.0000 mg | ORAL_TABLET | Freq: Four times a day (QID) | ORAL | 0 refills | Status: DC | PRN

## 2023-05-17 NOTE — Progress Notes (Signed)
 Subjective:  Patient ID: Mary Franco, female    DOB: [birth year]  Age: 58 y.o. MRN: [id number]  Chief Complaint  Patient presents with   Knee Pain    Left    HPI Pt in today to have paperwork filled out for disability - she has missed work for almost a year.  It stems from the fact she needs left total knee replacement.  This has been postponed a few times because of electrolyte abnormalities.  She has recurrently had low magnesium, potassium and calcium despite being on supplements.  Has been to ED multiple times for treatment as well.  She will be seeing GI and nephrology for these issues but appointments are not for a few months. She denies any symptoms today besides knee pain Pt also with history of stroke in the past and she states that has also 'slowed her down ' and impaired her ability to work  Pt asks for refill of phenergan which she uses as needed for nausea       03/04/2023    1:41 PM 09/21/2022   11:27 AM 08/04/2022   10:53 AM 10/28/2021    7:45 AM 07/10/2020   10:01 AM  Depression screen PHQ 2/9  Decreased Interest 2 0 0 1 2  Down, Depressed, Hopeless 2 0 0 0 0  PHQ - 2 Score 4 0 0 1 2  Altered sleeping 1 0 2 0 3  Tired, decreased energy 3 0 3 3 1   Change in appetite 0 0 1 1 2   Feeling bad or failure about yourself  0 0  0 0  Trouble concentrating 0 0 0 0 0  Moving slowly or fidgety/restless 3 0 0 0 0  Suicidal thoughts 0 0 0 0 0  PHQ-9 Score 11 0 6 5 8   Difficult doing work/chores Not difficult at all Not difficult at all Somewhat difficult Somewhat difficult Not difficult at all        10/28/2021    7:44 AM 02/25/2022   10:59 AM 08/04/2022   10:53 AM 09/21/2022   11:27 AM 03/04/2023    1:41 PM  Fall Risk  Falls in the past year? 0  0 0 1  Was there an injury with Fall? 0  0 0 1  Fall Risk Category Calculator 0  0 0 2  Fall Risk Category (Retired) Low      (RETIRED) Patient Fall Risk Level Low fall risk Low fall risk     Patient at Risk for Falls Due to  No Fall Risks  No Fall Risks No Fall Risks   Fall risk Follow up Falls evaluation completed  Falls evaluation completed Falls evaluation completed      ROS CONSTITUTIONAL: Negative for chills, fatigue, fe E/N/T: Negative for ear pain, nasal congestion and sore throat.  CARDIOVASCULAR: Negative for chest pain, dizziness, palpitations and pedal edema.  RESPIRATORY: Negative for recent cough and dyspnea.  GASTROINTESTINAL: Negative for abdominal pain, acid reflux symptoms, constipation, diarrhea, nausea and vomiting.  MSK: see HPI INTEGUMENTARY: Negative for rash.  PSYCHIATRIC: Negative for sleep disturbance and to question depression screen.  Negative for depression, negative for anhedonia.    Current Outpatient Medications:    acetaminophen (TYLENOL) 500 MG tablet, Take 1,000 mg by mouth every 6 (six) hours as needed for moderate pain., Disp: , Rfl:    albuterol (PROVENTIL) (2.5 MG/3ML) 0.083% nebulizer solution, USE 1 VIAL IN NEBULIZER EVERY 4 TO 6 HOURS AS NEEDED FOR SHORTNESS OF BREATH FOR  WHEEZING FOR 5 DAYS, Disp: , Rfl:    albuterol (VENTOLIN HFA) 108 (90 Base) MCG/ACT inhaler, INHALE 2 PUFFS BY MOUTH EVERY 6 HOURS AS NEEDED FOR WHEEZING OR SHORTNESS OF BREATH, Disp: 9 g, Rfl: 0   alprazolam (XANAX) 2 MG tablet, Take 2 mg by mouth at bedtime., Disp: , Rfl:    apixaban (ELIQUIS) 5 MG TABS tablet, Take 1 tablet (5 mg total) by mouth 2 (two) times daily., Disp: 60 tablet, Rfl: 3   B Complex Vitamins (VITAMIN B COMPLEX PO), Take by mouth., Disp: , Rfl:    bisoprolol (ZEBETA) 10 MG tablet, Take 2 tablets (20 mg total) by mouth daily., Disp: 180 tablet, Rfl: 3   cetirizine (ZYRTEC ALLERGY) 10 MG tablet, Take 1 tablet (10 mg total) by mouth daily., Disp: 30 tablet, Rfl: 3   cholecalciferol (VITAMIN D3) 25 MCG (1000 UNIT) tablet, Take 1,000 Units by mouth daily., Disp: , Rfl:    esomeprazole (NEXIUM) 40 MG capsule, Take 40 mg by mouth daily at 12 noon., Disp: , Rfl:     Fluticasone-Umeclidin-Vilant (TRELEGY ELLIPTA) 100-62.5-25 MCG/ACT AEPB, INHALE 1 PUFF INTO LUNGS ONCE DAILY IN  THE  AFTERNOON, Disp: 1 each, Rfl: 2   gabapentin (NEURONTIN) 300 MG capsule, Take 1 capsule (300 mg total) by mouth at bedtime., Disp: 90 capsule, Rfl: 1   ketoconazole (NIZORAL) 2 % shampoo, Apply 1 Application topically as needed for irritation., Disp: , Rfl:    losartan (COZAAR) 50 MG tablet, Take 1 tablet (50 mg total) by mouth daily., Disp: 90 tablet, Rfl: 3   MAGNESIUM-OXIDE 400 (240 Mg) MG tablet, Take 1 tablet (400 mg total) by mouth daily., Disp: 30 tablet, Rfl: 3   ondansetron (ZOFRAN) 4 MG tablet, Take 1 tablet (4 mg total) by mouth every 8 (eight) hours as needed for nausea or vomiting., Disp: 30 tablet, Rfl: 1   potassium chloride SA (KLOR-CON M) 20 MEQ tablet, Take 1 tablet (20 mEq total) by mouth daily., Disp: 90 tablet, Rfl: 1   promethazine (PHENERGAN) 25 MG tablet, Take 1 tablet (25 mg total) by mouth every 6 (six) hours as needed for nausea or vomiting., Disp: 30 tablet, Rfl: 0  Past Medical History:  Diagnosis Date   Acute bronchitis 03/07/2018   Acute hypoxemic respiratory failure (HCC) 03/07/2018   Acute thromboembolic cerebrovascular accident [facility])    Alcohol abuse    Amphetamine use    Anxiety    Aortic atherosclerosis (HCC) 08/04/2022   ASD (atrial septal defect) 07/19/2018   Asthma    Atypical squamous cells of undetermined significance (ASCUS) on Papanicolaou smear of cervix 12/11/2019   NEG HR HPV, 12/17/2020 NEG HR HPV     POSITIVE HR HPV 09/03/2009 NEG HR HPV, 12/17/2020 NEG HR HPV     Atypical squamous cells of undetermined significance on cytologic smear of cervix (ASC-US) 12/12/2019   Cervical strain 04/16/2014   Cervicogenic headache 04/16/2014   Chest pain, musculoskeletal 06/26/2019   Fell end of April 2021 > neg cxr 06/26/2019 with parasthesias > try zostrix    Chronic allergic rhinitis 08/04/2022   Chronic combined systolic and diastolic CHF  (congestive heart failure) (HCC) 03/25/2018   Echo 02/2018: EF 40-45 // Echo 07/2018:  EF 55-60, trivial AI, small secundum ASD (L>R shunting).   Chronic pain of left knee 10/09/2021   Chronic sinusitis 09/01/2018   Onset 2005   - augmentin x 10 days 08/31/18    - 12/09/2018 referred to ENT  Jenne Pane eval >   -  Sinus CT p course of augmentin started 05/24/2019 if not better   - Allergy profile 05/24/19  >  Eos 0.1/  IgE  2790    Cigarette smoker 03/07/2018   Colonization with MRSA (methicillin resistant Staphylococcus aureus) 09/21/2022   Congestive heart failure (HCC) 02/15/2018   COPD (chronic obstructive pulmonary disease) (HCC)    COPD GOLD II/ active smoker  03/30/2018   Active smoker  - 03/30/2018   Walked RA  3  laps @  approx 231ft each @ avg pace  stopped due to end of study, mild sob and chest tight, no desats    - Spirometry 03/30/2018  FEV1 2.4  (94%)  Ratio 0.73 s physiologic curvature p > 4 h since last saba   - 03/30/2018    try trelegy sample to see if reduces need for saba    - 04/14/2018  After extensive coaching inhaler device,  effectiveness =    75% fr   COPD with acute exacerbation (HCC) 08/04/2022   CVA (cerebral vascular accident) [facility])    Essential hypertension    Fibromyalgia    GERD (gastroesophageal reflux disease)    History of CVA with residual deficit 02/13/2022   Hypercoagulable state due to paroxysmal atrial fibrillation (HCC) 12/16/2022   Hyperglycemia 08/04/2022   Hyperlipidemia    Hypokalemia    Hypomagnesemia    Irritable bowel syndrome 06/11/2022   Left ear hearing loss 12/29/2018   Low grade squamous intraepithelial lesion (LGSIL) on cervicovaginal cytologic smear 09/12/2018   Major depressive disorder 09/15/2011   Migraines    Mixed dyslipidemia 02/11/2022   NICM (nonischemic cardiomyopathy) (HCC) 03/25/2018   Old complete ACL tear, left 09/21/2022   Other acute recurrent sinusitis 10/10/2021   Palpitations 02/11/2022   Panic disorder without agoraphobia  09/15/2011   Paroxysmal atrial fibrillation (HCC) 12/16/2022   PFO (patent foramen ovale) 08/04/2022   Right leg weakness    Thromboembolic stroke (HCC) 02/11/2022   Tobacco abuse    Vitamin B12 deficiency    Vitamin D deficiency    Objective:  PHYSICAL EXAM:   BP 138/86   Pulse 86   Temp 98.5 F (36.9 C) (Temporal)   Resp 18   Ht 5\' 3"  (1.6 m)   Wt 162 lb 9.6 oz (73.8 kg)   SpO2 99%   BMI 28.80 kg/m    GEN: Well nourished, well developed, in no acute distress  Cardiac: RRR; no murmurs, rubs,  Respiratory:  normal respiratory rate and pattern with no distress - normal breath sounds with no rales, rhonchi, wheezes or rubs  MS: no deformity or atrophy - has brace on left knee Skin: warm and dry, no rash   Psych: euthymic mood, appropriate affect and demeanor  Assessment & Plan:    Hypokalemia -     CBC with Differential/Platelet -     Comprehensive metabolic panel with GFR  Hypomagnesemia -     Magnesium  Hypercalcemia -     CBC with Differential/Platelet -     Comprehensive metabolic panel with GFR  Chronic pain of left knee  Follow up with specialists as directed  Follow-up: Return in about 6 weeks (around 06/28/2023) for chronic fasting follow-up - 40 min.  An After Visit Summary was printed and given to the patient.  Jettie Pagan [facility] [phone]

## 2023-05-18 LAB — COMPREHENSIVE METABOLIC PANEL WITH GFR
ALT: 8 IU/L (ref 0–32)
AST: 15 IU/L (ref 0–40)
Albumin: 4.4 g/dL (ref 3.8–4.9)
Alkaline Phosphatase: 104 IU/L (ref 44–121)
BUN/Creatinine Ratio: 15 (ref 9–23)
BUN: 14 mg/dL (ref 6–24)
Bilirubin Total: 0.3 mg/dL (ref 0.0–1.2)
CO2: 24 mmol/L (ref 20–29)
Calcium: 9.4 mg/dL (ref 8.7–10.2)
Chloride: 101 mmol/L (ref 96–106)
Creatinine, Ser: 0.93 mg/dL (ref 0.57–1.00)
Globulin, Total: 2.5 g/dL (ref 1.5–4.5)
Glucose: 118 mg/dL — ABNORMAL HIGH (ref 70–99)
Potassium: 3.9 mmol/L (ref 3.5–5.2)
Sodium: 141 mmol/L (ref 134–144)
Total Protein: 6.9 g/dL (ref 6.0–8.5)
eGFR: 71 mL/min/{1.73_m2} (ref >59)

## 2023-05-18 LAB — CBC WITH DIFFERENTIAL/PLATELET
Basophils Absolute: 0 10*3/uL (ref 0.0–0.2)
Basos: 1 %
EOS (ABSOLUTE): 0.1 10*3/uL (ref 0.0–0.4)
Eos: 1 %
Hematocrit: 41.1 % (ref 34.0–46.6)
Hemoglobin: 12.6 g/dL (ref 11.1–15.9)
Immature Grans (Abs): 0 10*3/uL (ref 0.0–0.1)
Immature Granulocytes: 0 %
Lymphocytes Absolute: 2.8 10*3/uL (ref 0.7–3.1)
Lymphs: 38 %
MCH: 26.9 pg (ref 26.6–33.0)
MCHC: 30.7 g/dL — ABNORMAL LOW (ref 31.5–35.7)
MCV: 88 fL (ref 79–97)
Monocytes Absolute: 0.6 10*3/uL (ref 0.1–0.9)
Monocytes: 9 %
Neutrophils Absolute: 3.8 10*3/uL (ref 1.4–7.0)
Neutrophils: 51 %
Platelets: 404 10*3/uL (ref 150–450)
RBC: 4.68 x10E6/uL (ref 3.77–5.28)
RDW: 15.2 % (ref 11.7–15.4)
WBC: 7.3 10*3/uL (ref 3.4–10.8)

## 2023-05-18 LAB — MAGNESIUM: Magnesium: 1.3 mg/dL — ABNORMAL LOW (ref 1.6–2.3)

## 2023-05-21 ENCOUNTER — Telehealth: Payer: Self-pay | Admitting: Physician Assistant

## 2023-05-21 NOTE — Telephone Encounter (Signed)
 FYI (this message was sent to [place] at [facility] and patient is not seen here)  Copied from CRM [id number]. Topic: Medical Record Request - Other >> May 21, 2023 11:21 AM Mayer Masker wrote: Reason for CRM: Patient is requesting if form from [employer] can be filled that was sent on Tuesday please follow up with patient

## 2023-05-21 NOTE — Telephone Encounter (Signed)
 I have not received any forms this week from [facility]

## 2023-05-25 NOTE — Telephone Encounter (Signed)
I left detailed message on voicemail. ?

## 2023-06-07 ENCOUNTER — Ambulatory Visit (INDEPENDENT_AMBULATORY_CARE_PROVIDER_SITE_OTHER): Payer: BC Managed Care – PPO

## 2023-06-07 DIAGNOSIS — I639 Cerebral infarction, unspecified: Secondary | ICD-10-CM | POA: Diagnosis not present

## 2023-06-08 LAB — CUP PACEART REMOTE DEVICE CHECK
Date Time Interrogation Session: 20250420232120
Implantable Pulse Generator Implant Date: 20240610

## 2023-06-15 ENCOUNTER — Ambulatory Visit: Payer: BC Managed Care – PPO | Admitting: Physician Assistant

## 2023-06-15 ENCOUNTER — Encounter: Payer: Self-pay | Admitting: Physician Assistant

## 2023-06-15 ENCOUNTER — Ambulatory Visit (INDEPENDENT_AMBULATORY_CARE_PROVIDER_SITE_OTHER): Admitting: Physician Assistant

## 2023-06-15 VITALS — BP 118/70 | HR 71 | Temp 98.3°F | Resp 18 | Ht 63.0 in | Wt 160.2 lb

## 2023-06-15 DIAGNOSIS — R7303 Prediabetes: Secondary | ICD-10-CM

## 2023-06-15 DIAGNOSIS — E876 Hypokalemia: Secondary | ICD-10-CM

## 2023-06-15 DIAGNOSIS — G8929 Other chronic pain: Secondary | ICD-10-CM

## 2023-06-15 DIAGNOSIS — E782 Mixed hyperlipidemia: Secondary | ICD-10-CM

## 2023-06-15 DIAGNOSIS — I10 Essential (primary) hypertension: Secondary | ICD-10-CM

## 2023-06-15 DIAGNOSIS — Z720 Tobacco use: Secondary | ICD-10-CM

## 2023-06-15 DIAGNOSIS — E559 Vitamin D deficiency, unspecified: Secondary | ICD-10-CM

## 2023-06-15 DIAGNOSIS — M25562 Pain in left knee: Secondary | ICD-10-CM | POA: Diagnosis not present

## 2023-06-15 DIAGNOSIS — B370 Candidal stomatitis: Secondary | ICD-10-CM

## 2023-06-15 MED ORDER — NYSTATIN 100000 UNIT/ML MT SUSP
5.0000 mL | Freq: Four times a day (QID) | OROMUCOSAL | 0 refills | Status: DC
Start: 2023-06-15 — End: 2023-07-06

## 2023-06-15 NOTE — Progress Notes (Signed)
 Subjective:  Patient ID: Mary Franco, female    DOB: [birth year]  Age: 58 y.o. MRN: [id number]  Chief Complaint  Patient presents with   Medical Management of Chronic Issues   HPI Pt with history of hypertension and history of cryptogenic stroke in 12/23.  She is currently following with cardiology but unsure when she has a follow up appt  She has known PFO and has seen Dr Lawana Pray and had ILR placed  She is currently taking  bisoprolol  10 mg, and losartan  50mg  as well as Eliquis  5mg  Bp is elevated today but she feels stressed and drinking energy drink/coffee this am and did not take meds this morning Denies chest pain or shortness of breath Pt advised to call and set up cardiology follow up appt  Pt also follows with neurology (Dr Janett Medin) for history of stroke. With next appt in May.  He is the provider that set up for ILR  Pt was diagnosed with aortic atherosclerosis at same time of her stroke and started on crestor  40mg  and zetia  10mg  but has stopped those medications Unsure how long she has been off meds - will get labwork and advise med treatment after results  Pt with history of copd/emphysema  Pt does continue to smoke about 3/4ppd - She currently uses ventolin  prn and trelegy Is agreeable to set up lung cancer screening  Pt with history of GERD - stable on Nexium  40mg   Pt with history of anxiety - takes xanax  2mg  prn  Pt with vit D def - taking daily supplement - is due for labwork  Pt with history of low magnesium  and potassium - is currently on supplements and due for labwork  Pt recently treated for uri and on augmentin  - complains of thrush    06/15/2023    1:45 PM 03/04/2023    1:41 PM 09/21/2022   11:27 AM 08/04/2022   10:53 AM 10/28/2021    7:45 AM  Depression screen PHQ 2/9  Decreased Interest 2 2 0 0 1  Down, Depressed, Hopeless 2 2 0 0 0  PHQ - 2 Score 4 4 0 0 1  Altered sleeping 0 1 0 2 0  Tired, decreased energy 3 3 0 3 3  Change in appetite 0 0 0 1 1   Feeling bad or failure about yourself  1 0 0  0  Trouble concentrating 1 0 0 0 0  Moving slowly or fidgety/restless 1 3 0 0 0  Suicidal thoughts 0 0 0 0 0  PHQ-9 Score 10 11 0 6 5  Difficult doing work/chores Somewhat difficult Not difficult at all Not difficult at all Somewhat difficult Somewhat difficult        02/25/2022   10:59 AM 08/04/2022   10:53 AM 09/21/2022   11:27 AM 03/04/2023    1:41 PM 06/15/2023    1:45 PM  Fall Risk  Falls in the past year?  0 0 1 0  Was there an injury with Fall?  0 0 1 0  Fall Risk Category Calculator  0 0 2 0  (RETIRED) Patient Fall Risk Level Low fall risk      Patient at Risk for Falls Due to  No Fall Risks No Fall Risks  No Fall Risks  Fall risk Follow up  Falls evaluation completed Falls evaluation completed  Falls evaluation completed    CONSTITUTIONAL: Negative for chills, fatigue, fever, unintentional weight gain and unintentional weight loss.  E/N/T: see HPI CARDIOVASCULAR: Negative for  chest pain, dizziness, palpitations and pedal edema.  RESPIRATORY: Negative for recent cough and dyspnea.  GASTROINTESTINAL: Negative for abdominal pain, acid reflux symptoms, constipation, diarrhea, nausea and vomiting.  ZOX:WRUEAVW left knee pain INTEGUMENTARY: Negative for rash.  NEUROLOGICAL: Negative for dizziness and headaches.  PSYCHIATRIC: Negative for sleep disturbance and to question depression screen.  Negative for depression, negative for anhedonia.        Current Outpatient Medications:    acetaminophen  (TYLENOL ) 500 MG tablet, Take 1,000 mg by mouth every 6 (six) hours as needed for moderate pain., Disp: , Rfl:    albuterol  (PROVENTIL ) (2.5 MG/3ML) 0.083% nebulizer solution, USE 1 VIAL IN NEBULIZER EVERY 4 TO 6 HOURS AS NEEDED FOR SHORTNESS OF BREATH FOR WHEEZING FOR 5 DAYS, Disp: , Rfl:    albuterol  (VENTOLIN  HFA) 108 (90 Base) MCG/ACT inhaler, INHALE 2 PUFFS BY MOUTH EVERY 6 HOURS AS NEEDED FOR WHEEZING OR SHORTNESS OF BREATH, Disp: 9 g,  Rfl: 0   alprazolam  (XANAX ) 2 MG tablet, Take 2 mg by mouth at bedtime., Disp: , Rfl:    amoxicillin -clavulanate (AUGMENTIN ) 875-125 MG tablet, Take 1 tablet by mouth 2 (two) times daily., Disp: , Rfl:    apixaban  (ELIQUIS ) 5 MG TABS tablet, Take 1 tablet (5 mg total) by mouth 2 (two) times daily., Disp: 60 tablet, Rfl: 3   B Complex Vitamins (VITAMIN B COMPLEX PO), Take by mouth., Disp: , Rfl:    bisoprolol  (ZEBETA ) 10 MG tablet, Take 2 tablets (20 mg total) by mouth daily., Disp: 180 tablet, Rfl: 3   cetirizine  (ZYRTEC  ALLERGY) 10 MG tablet, Take 1 tablet (10 mg total) by mouth daily., Disp: 30 tablet, Rfl: 3   cholecalciferol (VITAMIN D3) 25 MCG (1000 UNIT) tablet, Take 1,000 Units by mouth daily., Disp: , Rfl:    esomeprazole  (NEXIUM ) 40 MG capsule, Take 40 mg by mouth daily at 12 noon., Disp: , Rfl:    Fluticasone -Umeclidin-Vilant (TRELEGY ELLIPTA ) 100-62.5-25 MCG/ACT AEPB, INHALE 1 PUFF INTO LUNGS ONCE DAILY IN  THE  AFTERNOON, Disp: 1 each, Rfl: 2   gabapentin  (NEURONTIN ) 300 MG capsule, Take 1 capsule (300 mg total) by mouth at bedtime., Disp: 90 capsule, Rfl: 1   ketoconazole (NIZORAL) 2 % shampoo, Apply 1 Application topically as needed for irritation., Disp: , Rfl:    losartan  (COZAAR ) 50 MG tablet, Take 1 tablet (50 mg total) by mouth daily., Disp: 90 tablet, Rfl: 3   MAGNESIUM -OXIDE 400 (240 Mg) MG tablet, Take 1 tablet (400 mg total) by mouth daily., Disp: 30 tablet, Rfl: 3   nystatin  (MYCOSTATIN ) 100000 UNIT/ML suspension, Take 5 mLs (500,000 Units total) by mouth 4 (four) times daily., Disp: 60 mL, Rfl: 0   ondansetron  (ZOFRAN ) 4 MG tablet, Take 1 tablet (4 mg total) by mouth every 8 (eight) hours as needed for nausea or vomiting., Disp: 30 tablet, Rfl: 1   potassium chloride  SA (KLOR-CON  M) 20 MEQ tablet, Take 1 tablet (20 mEq total) by mouth daily., Disp: 90 tablet, Rfl: 1  Past Medical History:  Diagnosis Date   Acute bronchitis 03/07/2018   Acute hypoxemic respiratory failure  (HCC) 03/07/2018   Acute thromboembolic cerebrovascular accident [facility])    Alcohol abuse    Amphetamine use    Anxiety    Aortic atherosclerosis (HCC) 08/04/2022   ASD (atrial septal defect) 07/19/2018   Asthma    Atypical squamous cells of undetermined significance (ASCUS) on Papanicolaou smear of cervix 12/11/2019   NEG HR HPV, 12/17/2020 NEG HR HPV  POSITIVE HR HPV 09/03/2009 NEG HR HPV, 12/17/2020 NEG HR HPV     Atypical squamous cells of undetermined significance on cytologic smear of cervix (ASC-US ) 12/12/2019   Cervical strain 04/16/2014   Cervicogenic headache 04/16/2014   Chest pain, musculoskeletal 06/26/2019   Marvell Slider end of April 2021 > neg cxr 06/26/2019 with parasthesias > try zostrix    Chronic allergic rhinitis 08/04/2022   Chronic combined systolic and diastolic CHF (congestive heart failure) (HCC) 03/25/2018   Echo 02/2018: EF 40-45 // Echo 07/2018:  EF 55-60, trivial AI, small secundum ASD (L>R shunting).   Chronic pain of left knee 10/09/2021   Chronic sinusitis 09/01/2018   Onset 2005   - augmentin  x 10 days 08/31/18    - 12/09/2018 referred to ENT  Tellis Feathers eval >   - Sinus CT p course of augmentin  started 05/24/2019 if not better   - Allergy profile 05/24/19  >  Eos 0.1/  IgE  2790    Cigarette smoker 03/07/2018   Colonization with MRSA (methicillin resistant Staphylococcus aureus) 09/21/2022   Congestive heart failure (HCC) 02/15/2018   COPD (chronic obstructive pulmonary disease) (HCC)    COPD GOLD II/ active smoker  03/30/2018   Active smoker  - 03/30/2018   Walked RA  3  laps @  approx 274ft each @ avg pace  stopped due to end of study, mild sob and chest tight, no desats    - Spirometry 03/30/2018  FEV1 2.4  (94%)  Ratio 0.73 s physiologic curvature p > 4 h since last saba   - 03/30/2018    try trelegy sample to see if reduces need for saba    - 04/14/2018  After extensive coaching inhaler device,  effectiveness =    75% fr   COPD with acute exacerbation (HCC) 08/04/2022   CVA  (cerebral vascular accident) [facility])    Essential hypertension    Fibromyalgia    GERD (gastroesophageal reflux disease)    History of CVA with residual deficit 02/13/2022   Hypercoagulable state due to paroxysmal atrial fibrillation (HCC) 12/16/2022   Hyperglycemia 08/04/2022   Hyperlipidemia    Hypokalemia    Hypomagnesemia    Irritable bowel syndrome 06/11/2022   Left ear hearing loss 12/29/2018   Low grade squamous intraepithelial lesion (LGSIL) on cervicovaginal cytologic smear 09/12/2018   Major depressive disorder 09/15/2011   Migraines    Mixed dyslipidemia 02/11/2022   NICM (nonischemic cardiomyopathy) (HCC) 03/25/2018   Old complete ACL tear, left 09/21/2022   Other acute recurrent sinusitis 10/10/2021   Palpitations 02/11/2022   Panic disorder without agoraphobia 09/15/2011   Paroxysmal atrial fibrillation (HCC) 12/16/2022   PFO (patent foramen ovale) 08/04/2022   Right leg weakness    Thromboembolic stroke (HCC) 02/11/2022   Tobacco abuse    Vitamin B12 deficiency    Vitamin D  deficiency    Objective:  PHYSICAL EXAM:   VS: BP 118/70   Pulse 71   Temp 98.3 F (36.8 C) (Temporal)   Resp 18   Ht 5\' 3"  (1.6 m)   Wt 160 lb 3.2 oz (72.7 kg)   SpO2 98%   BMI 28.38 kg/m   GEN: Well nourished, well developed, in no acute distress  Oropharynx - thrush noted  Cardiac: RRR; no murmurs, rubs, or gallops,no edema -  Respiratory:  normal respiratory rate and pattern with no distress - normal breath sounds with no rales, rhonchi, wheezes or rubs  MS: has on knee brace Skin: warm and dry, no  rash  Neuro:  Alert and Oriented x 3, - CN II-Xii grossly intact Psych: euthymic mood, appropriate affect and demeanor   Assessment & Plan:    Essential hypertension -     CBC with Differential/Platelet -     Comprehensive metabolic panel -     TSH Continue current meds - History of CVA with residual deficit Follow up with cardiology and neurology as directed Aortic  atherosclerosis (HCC) -     Lipid panel Will restart meds according to results COPD Continue albuterol  and trelegy Recommend stop smoking Low dose chest CT for lung cancer screening  Vitamin D  deficiency -     VITAMIN D  25 Hydroxy (Vit-D Deficiency, Fractures)  Tobacco abuse Recommend stop smoking Low dose chest CT Hyperglycemia -     Hemoglobin A1c  PFO (patent foramen ovale) Chronic a fib Follow up with cardiology as directed- pt to make appt  CHRONIC left knee pain Follow up with ortho as scheduled  Oral thrush Rx nystatin   Follow-up: Return in about 4 months (around 10/15/2023) for chronic fasting follow-up.  An After Visit Summary was printed and given to the patient.  Anthonette Bastos [facility] [phone]

## 2023-06-16 ENCOUNTER — Other Ambulatory Visit: Payer: Self-pay | Admitting: Physician Assistant

## 2023-06-16 ENCOUNTER — Encounter: Payer: Self-pay | Admitting: Internal Medicine

## 2023-06-16 DIAGNOSIS — E782 Mixed hyperlipidemia: Secondary | ICD-10-CM

## 2023-06-16 LAB — LIPID PANEL
Chol/HDL Ratio: 4 ratio (ref 0.0–4.4)
Cholesterol, Total: 137 mg/dL (ref 100–199)
HDL: 34 mg/dL — ABNORMAL LOW (ref >39)
LDL Chol Calc (NIH): 77 mg/dL (ref 0–99)
Triglycerides: 146 mg/dL (ref 0–149)
VLDL Cholesterol Cal: 26 mg/dL (ref 5–40)

## 2023-06-16 LAB — COMPREHENSIVE METABOLIC PANEL WITH GFR
ALT: 5 IU/L (ref 0–32)
AST: 10 IU/L (ref 0–40)
Albumin: 4 g/dL (ref 3.8–4.9)
Alkaline Phosphatase: 97 IU/L (ref 44–121)
BUN/Creatinine Ratio: 17 (ref 9–23)
BUN: 14 mg/dL (ref 6–24)
Bilirubin Total: 0.3 mg/dL (ref 0.0–1.2)
CO2: 25 mmol/L (ref 20–29)
Calcium: 8.1 mg/dL — ABNORMAL LOW (ref 8.7–10.2)
Chloride: 97 mmol/L (ref 96–106)
Creatinine, Ser: 0.82 mg/dL (ref 0.57–1.00)
Globulin, Total: 2.4 g/dL (ref 1.5–4.5)
Glucose: 111 mg/dL — ABNORMAL HIGH (ref 70–99)
Potassium: 2.9 mmol/L — ABNORMAL LOW (ref 3.5–5.2)
Sodium: 142 mmol/L (ref 134–144)
Total Protein: 6.4 g/dL (ref 6.0–8.5)
eGFR: 82 mL/min/{1.73_m2} (ref >59)

## 2023-06-16 LAB — CBC WITH DIFFERENTIAL/PLATELET
Basophils Absolute: 0 10*3/uL (ref 0.0–0.2)
Basos: 0 %
EOS (ABSOLUTE): 0.1 10*3/uL (ref 0.0–0.4)
Eos: 1 %
Hematocrit: 39.4 % (ref 34.0–46.6)
Hemoglobin: 12.2 g/dL (ref 11.1–15.9)
Immature Grans (Abs): 0 10*3/uL (ref 0.0–0.1)
Immature Granulocytes: 0 %
Lymphocytes Absolute: 2.5 10*3/uL (ref 0.7–3.1)
Lymphs: 27 %
MCH: 27.1 pg (ref 26.6–33.0)
MCHC: 31 g/dL — ABNORMAL LOW (ref 31.5–35.7)
MCV: 88 fL (ref 79–97)
Monocytes Absolute: 0.6 10*3/uL (ref 0.1–0.9)
Monocytes: 7 %
Neutrophils Absolute: 6 10*3/uL (ref 1.4–7.0)
Neutrophils: 65 %
Platelets: 464 10*3/uL — ABNORMAL HIGH (ref 150–450)
RBC: 4.5 x10E6/uL (ref 3.77–5.28)
RDW: 14 % (ref 11.7–15.4)
WBC: 9.2 10*3/uL (ref 3.4–10.8)

## 2023-06-16 LAB — HEMOGLOBIN A1C
Est. average glucose Bld gHb Est-mCnc: 146 mg/dL
Hgb A1c MFr Bld: 6.7 % — ABNORMAL HIGH (ref 4.8–5.6)

## 2023-06-16 LAB — TSH: TSH: 1.52 u[IU]/mL (ref 0.450–4.500)

## 2023-06-16 LAB — MAGNESIUM: Magnesium: 1.1 mg/dL — ABNORMAL LOW (ref 1.6–2.3)

## 2023-06-16 LAB — VITAMIN D 25 HYDROXY (VIT D DEFICIENCY, FRACTURES): Vit D, 25-Hydroxy: 54.2 ng/mL (ref 30.0–100.0)

## 2023-06-16 MED ORDER — ROSUVASTATIN CALCIUM 20 MG PO TABS: 20.0000 mg | ORAL_TABLET | Freq: Every day | ORAL | 0 refills | Status: DC

## 2023-06-18 NOTE — Addendum Note (Signed)
 Addended by: Edra Govern D on: 06/18/2023 04:05 PM   Modules accepted: Orders

## 2023-06-18 NOTE — Progress Notes (Signed)
 Carelink Summary Report / Loop Recorder

## 2023-06-23 ENCOUNTER — Telehealth: Payer: Self-pay

## 2023-06-23 NOTE — Telephone Encounter (Signed)
   Pre-operative Risk Assessment    Patient Name: VIDYA KRUPSKI  DOB: October 19, 1965 MRN: [id number]   Date of last office visit: 12/30/22 Date of next office visit: N/A   Request for Surgical Clearance    Procedure:   colonoscopy  Date of Surgery:  Clearance TBD                                Surgeon:  Dr. Micki Alas Surgeon's Group or Practice Name:  [facility] Phone number:  [phone] Fax number:  [phone]   Type of Clearance Requested:   - Pharmacy:  Hold Aspirin  and Apixaban  (Eliquis ) Aspirin  for 5 days prior and Eliquis  2 days prior   Type of Anesthesia:  Not Indicated   Additional requests/questions:    Gib Kurk   06/23/2023, 3:20 PM

## 2023-06-25 ENCOUNTER — Ambulatory Visit (INDEPENDENT_AMBULATORY_CARE_PROVIDER_SITE_OTHER)
Admission: RE | Admit: 2023-06-25 | Discharge: 2023-06-25 | Disposition: A | Discharge status: Home / Self Care | Source: Ambulatory Visit | Attending: Physician Assistant | Admitting: Physician Assistant

## 2023-06-25 DIAGNOSIS — F17210 Nicotine dependence, cigarettes, uncomplicated: Secondary | ICD-10-CM | POA: Diagnosis not present

## 2023-06-25 DIAGNOSIS — Z720 Tobacco use: Secondary | ICD-10-CM

## 2023-06-25 NOTE — Telephone Encounter (Signed)
   Name: Mary Franco  DOB: 24-Jun-1965  MRN: [id number]  Primary Cardiologist: Nelia Balzarine, MD   Preoperative team, please contact this patient and set up a phone call appointment for further preoperative risk assessment. Please obtain consent and complete medication review. Thank you for your help.  I confirm that guidance regarding antiplatelet and oral anticoagulation therapy has been completed and, if necessary, noted below.  Per Pharm D, patient may hold Eliquis  for 2 days prior to procedure.    I also confirmed the patient resides in the state of [place] . As per [facility] Board telemedicine laws, the patient must reside in the state in which the provider is licensed.   Morey Ar, NP 06/25/2023, 5:02 PM [facility]

## 2023-06-25 NOTE — Telephone Encounter (Signed)
 Patient with diagnosis of afib on Eliquis  for anticoagulation.    Procedure: colonoscopy Date of procedure: TBD   CHA2DS2-VASc Score = 6   This indicates a 9.7% annual risk of stroke. The patient's score is based upon: CHF History: 1 HTN History: 1 Diabetes History: 0 Stroke History: 2 Vascular Disease History: 1 Age Score: 0 Gender Score: 1      CrCl 70 ml/min Platelet count 464  Patient has not had an Afib/aflutter ablation within the last 3 months or DCCV within the last 30 days  Per office protocol, patient can hold Eliquis  for 2 days prior to procedure.    **This guidance is not considered finalized until pre-operative APP has relayed final recommendations.**

## 2023-06-28 ENCOUNTER — Telehealth: Payer: Self-pay

## 2023-06-28 NOTE — Telephone Encounter (Signed)
 S/W pt and TELE Preop appt is scheduled for 06/30/23

## 2023-06-28 NOTE — Telephone Encounter (Signed)
 med rec and consent done

## 2023-06-28 NOTE — Telephone Encounter (Signed)
 med rec and consent done     Patient Consent for Virtual Visit        Mary Franco has provided verbal consent on 06/28/2023 for a virtual visit (video or telephone).   CONSENT FOR VIRTUAL VISIT FOR:  Mary Franco  By participating in this virtual visit I agree to the following:  I hereby voluntarily request, consent and authorize [facility] and its employed or contracted physicians, physician assistants, nurse practitioners or other licensed health care professionals (the Practitioner), to provide me with telemedicine health care services (the "Services") as deemed necessary by the treating Practitioner. I acknowledge and consent to receive the Services by the Practitioner via telemedicine. I understand that the telemedicine visit will involve communicating with the Practitioner through live audiovisual communication technology and the disclosure of certain medical information by electronic transmission. I acknowledge that I have been given the opportunity to request an in-person assessment or other available alternative prior to the telemedicine visit and am voluntarily participating in the telemedicine visit.  I understand that I have the right to withhold or withdraw my consent to the use of telemedicine in the course of my care at any time, without affecting my right to future care or treatment, and that the Practitioner or I may terminate the telemedicine visit at any time. I understand that I have the right to inspect all information obtained and/or recorded in the course of the telemedicine visit and may receive copies of available information for a reasonable fee.  I understand that some of the potential risks of receiving the Services via telemedicine include:  Delay or interruption in medical evaluation due to technological equipment failure or disruption; Information transmitted may not be sufficient (e.g. poor resolution of images) to allow for appropriate medical  decision making by the Practitioner; and/or  In rare instances, security protocols could fail, causing a breach of personal health information.  Furthermore, I acknowledge that it is my responsibility to provide information about my medical history, conditions and care that is complete and accurate to the best of my ability. I acknowledge that Practitioner's advice, recommendations, and/or decision may be based on factors not within their control, such as incomplete or inaccurate data provided by me or distortions of diagnostic images or specimens that may result from electronic transmissions. I understand that the practice of medicine is not an exact science and that Practitioner makes no warranties or guarantees regarding treatment outcomes. I acknowledge that a copy of this consent can be made available to me via my patient portal [facility] MyChart), or I can request a printed copy by calling the office of [facility].    I understand that my insurance will be billed for this visit.   I have read or had this consent read to me. I understand the contents of this consent, which adequately explains the benefits and risks of the Services being provided via telemedicine.  I have been provided ample opportunity to ask questions regarding this consent and the Services and have had my questions answered to my satisfaction. I give my informed consent for the services to be provided through the use of telemedicine in my medical care

## 2023-06-29 ENCOUNTER — Ambulatory Visit: Payer: BC Managed Care – PPO | Admitting: Neurology

## 2023-06-29 ENCOUNTER — Encounter: Payer: Self-pay | Admitting: Neurology

## 2023-06-30 ENCOUNTER — Ambulatory Visit: Attending: Cardiology | Admitting: Emergency Medicine

## 2023-06-30 ENCOUNTER — Other Ambulatory Visit: Payer: Self-pay

## 2023-06-30 DIAGNOSIS — Z01810 Encounter for preprocedural cardiovascular examination: Secondary | ICD-10-CM

## 2023-06-30 NOTE — Progress Notes (Signed)
 Virtual Visit via Telephone Note   Because of [name] co-morbid illnesses, she is at least at moderate risk for complications without adequate follow up.  This format is felt to be most appropriate for this patient at this time.  Due to technical limitations with video connection (technology), today's appointment will be conducted as an audio only telehealth visit, and Mary Franco verbally agreed to proceed in this manner.   All issues noted in this document were discussed and addressed.  No physical exam could be performed with this format.  Evaluation Performed:  Preoperative cardiovascular risk assessment _____________   Date:  06/30/2023   Patient ID:  Mary Franco, DOB 1965-05-07, MRN [id number] Patient Location:  Home Provider location:   Office  Primary Care Provider:  Cyndi Drain, PA-C Primary Cardiologist:  Nelia Balzarine, MD  Chief Complaint / Patient Profile   58 y.o. y/o female with a h/o atrial septal defect, cryptogenic stroke, paroxysmal atrial fibrillation, hypertension, COPD, hyperlipidemia, tobacco abuse who is pending colonoscopy on date TBD with [facility] health [place] digestive disease and presents today for telephonic preoperative cardiovascular risk assessment.  History of Present Illness    Mary Franco is a 58 y.o. female who presents via audio/video conferencing for a telehealth visit today.  Pt was last seen in cardiology clinic on 12/30/2022 by Dr. Lafayette Pierre.  At that time Mary Franco was doing well.  The patient is now pending procedure as outlined above. Since her last visit, she denies chest pain, lower extremity edema, fatigue, palpitations, melena, hematuria, hemoptysis, diaphoresis, weakness, presyncope, syncope, orthopnea, and PND.  Today patient is doing well overall.  She denies any acute cardiovascular concerns or complaints.  She notes that chronic shortness of breath is stable, no recent exacerbations.  She stays  relatively active without any exertional symptoms.  She is able to complete greater than 4 METS. Past Medical History    Past Medical History:  Diagnosis Date   Acute bronchitis 03/07/2018   Acute hypoxemic respiratory failure (HCC) 03/07/2018   Acute thromboembolic cerebrovascular accident [facility])    Alcohol abuse    Amphetamine use    Anxiety    Aortic atherosclerosis (HCC) 08/04/2022   ASD (atrial septal defect) 07/19/2018   Asthma    Atypical squamous cells of undetermined significance (ASCUS) on Papanicolaou smear of cervix 12/11/2019   NEG HR HPV, 12/17/2020 NEG HR HPV     POSITIVE HR HPV 09/03/2009 NEG HR HPV, 12/17/2020 NEG HR HPV     Atypical squamous cells of undetermined significance on cytologic smear of cervix (ASC-US ) 12/12/2019   Cervical strain 04/16/2014   Cervicogenic headache 04/16/2014   Chest pain, musculoskeletal 06/26/2019   Fell end of April 2021 > neg cxr 06/26/2019 with parasthesias > try zostrix    Chronic allergic rhinitis 08/04/2022   Chronic combined systolic and diastolic CHF (congestive heart failure) (HCC) 03/25/2018   Echo 02/2018: EF 40-45 // Echo 07/2018:  EF 55-60, trivial AI, small secundum ASD (L>R shunting).   Chronic pain of left knee 10/09/2021   Chronic sinusitis 09/01/2018   Onset 2005   - augmentin  x 10 days 08/31/18    - 12/09/2018 referred to ENT  Mary Franco eval >   - Sinus CT p course of augmentin  started 05/24/2019 if not better   - Allergy profile 05/24/19  >  Eos 0.1/  IgE  2790    Cigarette smoker 03/07/2018   Colonization with MRSA (methicillin resistant Staphylococcus aureus) 09/21/2022  Congestive heart failure (HCC) 02/15/2018   COPD (chronic obstructive pulmonary disease) (HCC)    COPD GOLD II/ active smoker  03/30/2018   Active smoker  - 03/30/2018   Walked RA  3  laps @  approx 263ft each @ avg pace  stopped due to end of study, mild sob and chest tight, no desats    - Spirometry 03/30/2018  FEV1 2.4  (94%)  Ratio 0.73 s physiologic curvature p  > 4 h since last saba   - 03/30/2018    try trelegy sample to see if reduces need for saba    - 04/14/2018  After extensive coaching inhaler device,  effectiveness =    75% fr   COPD with acute exacerbation (HCC) 08/04/2022   CVA (cerebral vascular accident) [facility])    Essential hypertension    Fibromyalgia    GERD (gastroesophageal reflux disease)    History of CVA with residual deficit 02/13/2022   Hypercoagulable state due to paroxysmal atrial fibrillation (HCC) 12/16/2022   Hyperglycemia 08/04/2022   Hyperlipidemia    Hypokalemia    Hypomagnesemia    Irritable bowel syndrome 06/11/2022   Left ear hearing loss 12/29/2018   Low grade squamous intraepithelial lesion (LGSIL) on cervicovaginal cytologic smear 09/12/2018   Major depressive disorder 09/15/2011   Migraines    Mixed dyslipidemia 02/11/2022   NICM (nonischemic cardiomyopathy) (HCC) 03/25/2018   Old complete ACL tear, left 09/21/2022   Other acute recurrent sinusitis 10/10/2021   Palpitations 02/11/2022   Panic disorder without agoraphobia 09/15/2011   Paroxysmal atrial fibrillation (HCC) 12/16/2022   PFO (patent foramen ovale) 08/04/2022   Right leg weakness    Thromboembolic stroke (HCC) 02/11/2022   Tobacco abuse    Vitamin B12 deficiency    Vitamin D  deficiency    Past Surgical History:  Procedure Laterality Date   APPENDECTOMY     BUBBLE STUDY  02/25/2022   Procedure: BUBBLE STUDY;  Surgeon: Jann Melody, MD;  Location: [facility] ENDOSCOPY;  Service: Cardiovascular;;   KNEE SURGERY Left    RIGHT/LEFT HEART CATH AND CORONARY ANGIOGRAPHY N/A 03/08/2018   Procedure: RIGHT/LEFT HEART CATH AND CORONARY ANGIOGRAPHY;  Surgeon: Avanell Leigh, MD;  Location: [facility] INVASIVE CV LAB;  Service: Cardiovascular;  Laterality: N/A;   TEE WITHOUT CARDIOVERSION N/A 02/25/2022   Procedure: TRANSESOPHAGEAL ECHOCARDIOGRAM (TEE);  Surgeon: Jann Melody, MD;  [facility] ENDOSCOPY;  Service: Cardiovascular;  Laterality: N/A;    TUBAL LIGATION      Allergies  Allergies  Allergen Reactions   Codeine Itching   Sulfa Antibiotics Itching   Pholcodine Hives and Itching   Spironolactone      Electrolyte imbalance   Zetia  [Ezetimibe ] Swelling    Home Medications    Prior to Admission medications   Medication Sig Start Date End Date Taking? Authorizing Provider  acetaminophen  (TYLENOL ) 500 MG tablet Take 1,000 mg by mouth every 6 (six) hours as needed for moderate pain.    [provider]  albuterol  (PROVENTIL ) (2.5 MG/3ML) 0.083% nebulizer solution USE 1 VIAL IN NEBULIZER EVERY 4 TO 6 HOURS AS NEEDED FOR SHORTNESS OF BREATH FOR WHEEZING FOR 5 DAYS 08/14/22   [provider]  albuterol  (VENTOLIN  HFA) 108 (90 Base) MCG/ACT inhaler INHALE 2 PUFFS BY MOUTH EVERY 6 HOURS AS NEEDED FOR WHEEZING OR SHORTNESS OF BREATH 02/28/23   Cyndi Drain, PA-C  alprazolam  (XANAX ) 2 MG tablet Take 2 mg by mouth at bedtime.    [provider]  amoxicillin -clavulanate (AUGMENTIN ) 875-125 MG  tablet Take 1 tablet by mouth 2 (two) times daily. 06/10/23   [provider]  apixaban  (ELIQUIS ) 5 MG TABS tablet Take 1 tablet (5 mg total) by mouth 2 (two) times daily. 12/16/22   Fenton, Clint R, PA  B Complex Vitamins (VITAMIN B COMPLEX PO) Take by mouth.    [provider]  bisoprolol  (ZEBETA ) 10 MG tablet Take 2 tablets (20 mg total) by mouth daily. 03/08/23   Cyndi Drain, PA-C  cetirizine  (ZYRTEC  ALLERGY) 10 MG tablet Take 1 tablet (10 mg total) by mouth daily. 08/04/22   Cyndi Drain, PA-C  cholecalciferol (VITAMIN D3) 25 MCG (1000 UNIT) tablet Take 1,000 Units by mouth daily.    [provider]  esomeprazole  (NEXIUM ) 40 MG capsule Take 40 mg by mouth daily at 12 noon.    [provider]  Fluticasone -Umeclidin-Vilant (TRELEGY ELLIPTA ) 100-62.5-25 MCG/ACT AEPB INHALE 1 PUFF INTO LUNGS ONCE DAILY IN  THE  AFTERNOON 04/12/23   Cyndi Drain, PA-C  gabapentin  (NEURONTIN ) 300 MG capsule Take 1  capsule (300 mg total) by mouth at bedtime. 02/25/23   Cyndi Drain, PA-C  ketoconazole (NIZORAL) 2 % shampoo Apply 1 Application topically as needed for irritation. 04/11/22   [provider]  losartan  (COZAAR ) 50 MG tablet Take 1 tablet (50 mg total) by mouth daily. 11/02/22   Revankar, Micael Adas, MD  MAGNESIUM -OXIDE 400 (240 Mg) MG tablet Take 1 tablet (400 mg total) by mouth daily. 03/31/23   Cyndi Drain, PA-C  nystatin  (MYCOSTATIN ) 100000 UNIT/ML suspension Take 5 mLs (500,000 Units total) by mouth 4 (four) times daily. 06/15/23   Cyndi Drain, PA-C  ondansetron  (ZOFRAN ) 4 MG tablet Take 1 tablet (4 mg total) by mouth every 8 (eight) hours as needed for nausea or vomiting. 04/29/21   Allegra Arch, NP  potassium chloride  SA (KLOR-CON  M) 20 MEQ tablet Take 1 tablet (20 mEq total) by mouth daily. 03/23/23   Cyndi Drain, PA-C  rosuvastatin  (CRESTOR ) 20 MG tablet Take 1 tablet (20 mg total) by mouth daily. 06/16/23   Cyndi Drain, PA-C    Physical Exam    Vital Signs:  Mary Franco does not have vital signs available for review today.  Given telephonic nature of communication, physical exam is limited. AAOx3. NAD. Normal affect.  Speech and respirations are unlabored.  Accessory Clinical Findings    None  Assessment & Plan    1.  Preoperative Cardiovascular Risk Assessment: According to the Revised Cardiac Risk Index (RCRI), her Perioperative Risk of Major Cardiac Event is (%): 0.9. Her Functional Capacity in METs is: 5.62 according to the Duke Activity Status Index (DASI). Therefore, based on ACC/AHA guidelines, patient would be at acceptable risk for the planned procedure without further cardiovascular testing.   The patient was advised that if she develops new symptoms prior to surgery to contact our office to arrange for a follow-up visit, and she verbalized understanding.  Per office protocol, patient can hold Eliquis  for 2 days prior to procedure.    A copy of this note will be  routed to requesting surgeon.  Time:   Today, I have spent 9 minutes with the patient with telehealth technology discussing medical history, symptoms, and management plan.     Ava Boatman, NP  06/30/2023, 3:15 PM

## 2023-07-01 MED ORDER — APIXABAN 5 MG PO TABS: 5.0000 mg | ORAL_TABLET | Freq: Two times a day (BID) | ORAL | 3 refills | Status: AC

## 2023-07-05 ENCOUNTER — Telehealth: Payer: Self-pay | Admitting: Adult Health

## 2023-07-05 NOTE — Telephone Encounter (Signed)
 Pt called stating that she forgot her appt with doctor Janett Medin and was wanting to r/s with NP to be seen sooner.

## 2023-07-05 NOTE — Telephone Encounter (Signed)
 Appears the patient is scheduled for 07/06/23.

## 2023-07-06 ENCOUNTER — Encounter: Payer: Self-pay | Admitting: Adult Health

## 2023-07-06 ENCOUNTER — Ambulatory Visit: Admitting: Adult Health

## 2023-07-06 VITALS — BP 141/84 | HR 88 | Ht 63.0 in | Wt 162.0 lb

## 2023-07-06 DIAGNOSIS — G44209 Tension-type headache, unspecified, not intractable: Secondary | ICD-10-CM | POA: Diagnosis not present

## 2023-07-06 DIAGNOSIS — I480 Paroxysmal atrial fibrillation: Secondary | ICD-10-CM | POA: Diagnosis not present

## 2023-07-06 DIAGNOSIS — I639 Cerebral infarction, unspecified: Secondary | ICD-10-CM

## 2023-07-06 MED ORDER — AMITRIPTYLINE HCL 25 MG PO TABS: 25.0000 mg | ORAL_TABLET | Freq: Every day | ORAL | 5 refills | Status: DC

## 2023-07-06 NOTE — Patient Instructions (Addendum)
 Your Plan:  Start amitriptyline 25mg  nightly for headache prevention   Please call after 2-3 weeks if no benefit for dosage increase or sooner if difficulty tolerating   Limit use of tylenol , Excedrin or any other over the counter pain relievers as this can cause rebound headaches   Continue to follow with your PCP and cardiology for stroke risk factor management      Follow up in 7 months or call earlier if needed     Thank you for coming to see us  at [facility]. I hope we have been able to provide you high quality care today.  You may receive a patient satisfaction survey over the next few weeks. We would appreciate your feedback and comments so that we may continue to improve ourselves and the health of our patients.    Amitriptyline Tablets What is this medication? AMITRIPTYLINE (a mee TRIP ti leen) treats depression. It increases the amount of serotonin and norepinephrine in the brain, hormones that help regulate mood. It belongs to a group of medications called tricyclic antidepressants (TCAs). This medicine may be used for other purposes; ask your health care provider or pharmacist if you have questions. COMMON BRAND NAME(S): Elavil, Vanatrip What should I tell my care team before I take this medication? They need to know if you have any of these conditions: Asthma, trouble breathing Bipolar disorder or schizophrenia Difficulty passing urine, prostate trouble Frequently drink alcohol Glaucoma Heart disease or previous heart attack Liver disease Seizures Suicidal thoughts, plans, or attempt by you or a family member Thyroid disease An unusual or allergic reaction to amitriptyline, other medications, foods, dyes, or preservatives Pregnant or trying to get pregnant Breastfeeding How should I use this medication? Take this medication by mouth with a drink of water. Follow the directions on the prescription label. You can take the tablets with or  without food. Take your medication at regular intervals. Do not take it more often than directed. Do not stop taking this medication suddenly except upon the advice of your care team. Stopping this medication too quickly may cause serious side effects or your condition may worsen. A special MedGuide will be given to you by the pharmacist with each prescription and refill. Be sure to read this information carefully each time. Talk to your care team regarding the use of this medication in children. Special care may be needed. Overdosage: If you think you have taken too much of this medicine contact a poison control center or emergency room at once. NOTE: This medicine is only for you. Do not share this medicine with others. What if I miss a dose? If you miss a dose, take it as soon as you can. If it is almost time for your next dose, take only that dose. Do not take double or extra doses. What may interact with this medication? Do not take this medication with any of the following: Arsenic trioxide Certain medications used to regulate abnormal heartbeat or to treat other heart conditions Cisapride Droperidol Halofantrine Linezolid MAOIs like Carbex, Eldepryl, Marplan, Nardil, and Parnate Methylene blue Other medications for mental depression Phenothiazines like perphenazine, thioridazine and chlorpromazine Pimozide Probucol Procarbazine Sparfloxacin St. John's Wort This medication may also interact with the following: Atropine and related medications like hyoscyamine, scopolamine, tolterodine and others Barbiturate medications for inducing sleep or treating seizures, like phenobarbital Cimetidine Disulfiram Ethchlorvynol Thyroid hormones such as levothyroxine Ziprasidone This list may not describe all possible interactions. Give your health care provider a list of  all the medicines, herbs, non-prescription drugs, or dietary supplements you use. Also tell them if you smoke, drink  alcohol, or use illegal drugs. Some items may interact with your medicine. What should I watch for while using this medication? Visit your care team for regular checks on your progress. It may take several weeks to see the full effects of this medication, and it is important to continue your treatment as prescribed by your care team. Tell your care team if your symptoms do not get better or if they get worse. Patients and their families should watch out for new or worsening thoughts of suicide or depression. Also watch out for sudden changes in feelings such as feeling anxious, agitated, panicky, irritable, hostile, aggressive, impulsive, severely restless, overly excited and hyperactive, or not being able to sleep. If this happens, especially at the beginning of treatment or after a change in dose, call your care team. This medication may affect your coordination, reaction time, or judgment. Do not drive or operate machinery until you know how this medication affects you. Sit up or stand slowly to reduce the risk of dizzy or fainting spells. Drinking alcohol with this medication can increase the risk of these side effects. Do not treat yourself for coughs, colds, or allergies while you are taking this medication without asking your care team for advice. Some ingredients can increase possible side effects. Your mouth may get dry. Chewing sugarless gum or sucking hard candy and drinking plenty of water may help. Contact your care team if the problem does not go away or is severe. This medication may cause dry eyes and blurred vision. If you wear contact lenses, you may feel some discomfort. Lubricating eye drops may help. See your care team if the problem does not go away or is severe. This medication can cause constipation. If you do not have a bowel movement for 3 days, call your care team. This medication can make you more sensitive to the sun. Keep out of the sun. If you cannot avoid being in the sun,  wear protective clothing and sunscreen. Do not use sun lamps, tanning beds, or tanning booths. What side effects may I notice from receiving this medication? Side effects that you should report to your care team as soon as possible: Allergic reactions--skin rash, itching, hives, swelling of the face, lips, tongue, or throat Heart rhythm changes-- fast or irregular heartbeat, dizziness, feeling faint or lightheaded, chest pain, trouble breathing Serotonin syndrome--irritability, confusion, fast or irregular heartbeat, muscle stiffness, twitching muscles, sweating, high fever, seizure, chills, vomiting, diarrhea Sudden eye pain or change in vision such as blurry vision, seeing halos around lights, vision loss Thoughts of suicide or self-harm, worsening mood, feelings of depression Side effects that usually do not require medical attention (report to your care team if they continue or are bothersome): Change in appetite or weight Change in sex drive or performance Constipation Dizziness Drowsiness Dry mouth Tremors This list may not describe all possible side effects. Call your doctor for medical advice about side effects. You may report side effects to FDA at 1-800-FDA-1088. Where should I keep my medication? Keep out of the reach of children and pets. Store at room temperature between 20 and 25 degrees C (68 and 77 degrees F). Throw away any unused medication after the expiration date. NOTE: This sheet is a summary. It may not cover all possible information. If you have questions about this medicine, talk to your doctor, pharmacist, or health care provider.  2024  Elsevier/Gold Standard (2021-10-16 00:00:00)

## 2023-07-06 NOTE — Progress Notes (Signed)
 [facility] [address] [place]. [place] [address] [phone]       OFFICE FOLLOW-UP VISIT NOTE  Ms. ANYSSA SHARPLESS Date of Birth:  60-Jan-1967 Medical Record Number:  [id number]   Primary neurologist: Dr. Janett Medin Reason for visit: stroke follow up   Chief Complaint  Patient presents with   Cerebrovascular Accident    Rm 3 alone Pt is well, reports she is having a increase in daily headaches. No other concerns.       HPI:   Update 07/06/2023 JM: Patient returns for follow-up visit after prior visit with Dr. Janett Medin 7 months ago.  Continues to do well from stroke standpoint without new or reoccurring stroke/TIA symptoms. Does report increased headaches over the past several months, bitemporal with dull pressure sensation, present daily, can be present when waking up or occur mid day. She has been taking tylenol  daily over the past several months and more recently taking Excedrin as she is out of Tylenol .  Denies photophobia, phonophobia or N/V.  No visual changes.  She does endorse increased stress especially as she has not worked over the past year and has persistent knee pain which she feels could be contributing to headaches.  She is currently on mag oxide for magnesium  deficiency.  She also mentions mild short-term memory difficulties such as remembering appointments which has been present since her stroke. She was found to have new onset A-fib per ILR on 12/11/2022 and Eliquis  initiated.  Remains on Eliquis  and Crestor  without side effects.  Routinely follows with PCP and cardiology.  No further questions or concerns at this time.    History provided for reference purposes only Update 11/19/2022 Dr. Janett Medin: She returns for follow-up after last visit 5 months ago.  She is doing well from stroke standpoint without recurrent stroke or TIA symptoms.  She is most bothered today by her left knee pain.  She had left ACL tear and had scheduled surgery earlier but her  insurance has changed and now needs to reschedule.  She had cut back smoking cigarettes to 2 cigarettes/day but now because of the stress she is gone up smoking again but is not smoking as much as she used to.  She had a loop recorder inserted and so far paroxysmal A-fib has not yet been found.  Lab work on 08/04/2022 showed hemoglobin A1c to be 6.2 and LDL cholesterol to be 71 mg percent.  She was seen by interventional cardiologist Dr. Caffie Castilla on 11/02/2022 patient has not yet decided whether she wants PFO closure.  Explained to her that her risk is at the most medium but she also needs to focus on controlling her modifiable risk factors  like smoking cessation and being compliant with her medications controlling blood pressure sugar and cholesterol and losing weight and eating healthy.  Initial visit 06/16/2022 Dr. Janett Medin: Ms. Vangieson is a 58 year old Caucasian lady seen today for initial office consultation visit for stroke.  History is obtained from the patient and review of electronic medical records I personally reviewed the available pertinent imaging films in PACS.  She has past medical history of DM, alcohol and amphetamine abuse, hyperlipidemia, hypertension, nonischemic cardiomyopathy, fibromyalgia and gastroesophageal reflux disease.  Patient woke up on 02/08/2022 and noticed heaviness weakness and numbness in the right leg.  Much of it was as the day went on she noticed worsening of her speech as well and increased leg weakness.  She was not able to continue at work and went home.  Family  convinced her to go to the ER that night and use went to [facility] where she was admitted for 4 days.  MRI scan of the brain small embolic infarct involving left insular and parietal lobes.  CT angiogram of brain and neck showed 50% bilateral carotid bifurcation stenosis with no large vessel occlusion.  2D echo showed normal ejection fraction.  Dilated with an outpatient on 02/25/2022 showed ejection fraction 55  to 60%.  No clots.  There was evidence of a small right to left.  LDL cholesterol was 77 mg percent.  Patient did wear a 30-day heart monitor which was done in January but I did not find the results patient is unaware of the results-patient has since been taking aspirin  and Plavix  and tolerating well with only minor bruising and no bleeding.  She said right leg weakness has recovered completely.  She is more bothered by left knee which is chronic due to her torn ACL and the knee brace.  She plans to have left knee surgery soon but prior to that she needs dental extraction he is here today asking for neurological clearance for both procedures.  She denies any prior history of strokes TIAs or significant neurological problems..There is family history of stroke in her grandfather.  Patient smokes and knows that she needs to quit.  Will refer to cardiologist Dr Lorie Rook who wants an opinion about possible endovascular PFO closure.  She denies any prior Palpitations, atrial fibrillation, syncope or significant coronary artery disease     ROS:   14 system review of systems is positive for those listed in HPI and all other systems negative  PMH:  Past Medical History:  Diagnosis Date   Acute bronchitis 03/07/2018   Acute hypoxemic respiratory failure (HCC) 03/07/2018   Acute thromboembolic cerebrovascular accident [facility])    Alcohol abuse    Amphetamine use    Anxiety    Aortic atherosclerosis (HCC) 08/04/2022   ASD (atrial septal defect) 07/19/2018   Asthma    Atypical squamous cells of undetermined significance (ASCUS) on Papanicolaou smear of cervix 12/11/2019   NEG HR HPV, 12/17/2020 NEG HR HPV     POSITIVE HR HPV 09/03/2009 NEG HR HPV, 12/17/2020 NEG HR HPV     Atypical squamous cells of undetermined significance on cytologic smear of cervix (ASC-US ) 12/12/2019   Cervical strain 04/16/2014   Cervicogenic headache 04/16/2014   Chest pain, musculoskeletal 06/26/2019   Fell end of April 2021 > neg  cxr 06/26/2019 with parasthesias > try zostrix    Chronic allergic rhinitis 08/04/2022   Chronic combined systolic and diastolic CHF (congestive heart failure) (HCC) 03/25/2018   Echo 02/2018: EF 40-45 // Echo 07/2018:  EF 55-60, trivial AI, small secundum ASD (L>R shunting).   Chronic pain of left knee 10/09/2021   Chronic sinusitis 09/01/2018   Onset 2005   - augmentin  x 10 days 08/31/18    - 12/09/2018 referred to ENT  Tellis Feathers eval >   - Sinus CT p course of augmentin  started 05/24/2019 if not better   - Allergy profile 05/24/19  >  Eos 0.1/  IgE  2790    Cigarette smoker 03/07/2018   Colonization with MRSA (methicillin resistant Staphylococcus aureus) 09/21/2022   Congestive heart failure (HCC) 02/15/2018   COPD (chronic obstructive pulmonary disease) (HCC)    COPD GOLD II/ active smoker  03/30/2018   Active smoker  - 03/30/2018   Walked RA  3  laps @  approx 284ft each @ avg pace  stopped due to end of study, mild sob and chest tight, no desats    - Spirometry 03/30/2018  FEV1 2.4  (94%)  Ratio 0.73 s physiologic curvature p > 4 h since last saba   - 03/30/2018    try trelegy sample to see if reduces need for saba    - 04/14/2018  After extensive coaching inhaler device,  effectiveness =    75% fr   COPD with acute exacerbation (HCC) 08/04/2022   CVA (cerebral vascular accident) [facility])    Essential hypertension    Fibromyalgia    GERD (gastroesophageal reflux disease)    History of CVA with residual deficit 02/13/2022   Hypercoagulable state due to paroxysmal atrial fibrillation (HCC) 12/16/2022   Hyperglycemia 08/04/2022   Hyperlipidemia    Hypokalemia    Hypomagnesemia    Irritable bowel syndrome 06/11/2022   Left ear hearing loss 12/29/2018   Low grade squamous intraepithelial lesion (LGSIL) on cervicovaginal cytologic smear 09/12/2018   Major depressive disorder 09/15/2011   Migraines    Mixed dyslipidemia 02/11/2022   NICM (nonischemic cardiomyopathy) (HCC) 03/25/2018   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, left 09/21/2022   Other acute recurrent sinusitis 10/10/2021   Palpitations 02/11/2022   Panic disorder without agoraphobia 09/15/2011   Paroxysmal atrial fibrillation (HCC) 12/16/2022   PFO (patent foramen ovale) 08/04/2022   Right leg weakness    Thromboembolic stroke (HCC) 02/11/2022   Tobacco abuse    Vitamin B12 deficiency    Vitamin D  deficiency     Social History:  Social History   Socioeconomic History   Marital status: Divorced    Spouse name: Not on file   Number of children: 2   Years of education: Not on file   Highest education level: Not on file  Occupational History   Occupation: Textiles  Tobacco Use   Smoking status: Every Day    Current packs/day: 0.50    Average packs/day: 1 pack/day for 88.4 years (84.2 ttl pk-yrs)    Types: Cigarettes    Start date: 1977   Smokeless tobacco: Never  Vaping Use   Vaping status: Never Used  Substance and Sexual Activity   Alcohol use: Yes    Comment: 2 x per week   Drug use: No   Sexual activity: Not Currently  Other Topics Concern   Not on file  Social History Narrative   ** Merged History Encounter **   Patient is right handed.   Patient drinks 3 cups caffeine daily.   Working on 2nd shift now at her job.  08-15-14          Social Drivers of [employer]: Low Risk  (09/21/2022)   Overall Financial Resource Strain (CARDIA)    Difficulty of Paying Living Expenses: Not hard at all  Food Insecurity: No Food Insecurity (09/21/2022)   Hunger Vital Sign    Worried About Running Out of Food in the Last Year: Never true    Ran Out of Food in the Last Year: Never true  Transportation Needs: No Transportation Needs (09/21/2022)   PRAPARE - Administrator, Civil Service (Medical): No    Lack of Transportation (Non-Medical): No  Physical Activity: Inactive (09/21/2022)   Exercise Vital Sign    Days of Exercise per Week: 0 days    Minutes of Exercise per Session: 0 min  Stress:  Stress Concern Present (09/21/2022)   [employer] of Occupational Health - Occupational Stress Questionnaire  Feeling of Stress : To some extent  Social Connections: Socially Isolated (09/21/2022)   Social Connection and Isolation Panel [NHANES]    Frequency of Communication with Friends and Family: More than three times a week    Frequency of Social Gatherings with Friends and Family: More than three times a week    Attends Religious Services: Never    Database administrator or Organizations: No    Attends Banker Meetings: Never    Marital Status: Divorced  Catering manager Violence: Not At Risk (09/21/2022)   Humiliation, Afraid, Rape, and Kick questionnaire    Fear of Current or Ex-Partner: No    Emotionally Abused: No    Physically Abused: No    Sexually Abused: No    Medications:   Current Outpatient Medications on File Prior to Visit  Medication Sig Dispense Refill   acetaminophen  (TYLENOL ) 500 MG tablet Take 1,000 mg by mouth every 6 (six) hours as needed for moderate pain.     albuterol  (PROVENTIL ) (2.5 MG/3ML) 0.083% nebulizer solution USE 1 VIAL IN NEBULIZER EVERY 4 TO 6 HOURS AS NEEDED FOR SHORTNESS OF BREATH FOR WHEEZING FOR 5 DAYS     albuterol  (VENTOLIN  HFA) 108 (90 Base) MCG/ACT inhaler INHALE 2 PUFFS BY MOUTH EVERY 6 HOURS AS NEEDED FOR WHEEZING OR SHORTNESS OF BREATH 9 g 0   alprazolam  (XANAX ) 2 MG tablet Take 2 mg by mouth at bedtime.     apixaban  (ELIQUIS ) 5 MG TABS tablet Take 1 tablet (5 mg total) by mouth 2 (two) times daily. 60 tablet 3   B Complex Vitamins (VITAMIN B COMPLEX PO) Take by mouth.     bisoprolol  (ZEBETA ) 10 MG tablet Take 2 tablets (20 mg total) by mouth daily. 180 tablet 3   cetirizine  (ZYRTEC  ALLERGY) 10 MG tablet Take 1 tablet (10 mg total) by mouth daily. 30 tablet 3   cholecalciferol (VITAMIN D3) 25 MCG (1000 UNIT) tablet Take 1,000 Units by mouth daily.     esomeprazole  (NEXIUM ) 40 MG capsule Take 40 mg by mouth daily at 12  noon.     Fluticasone -Umeclidin-Vilant (TRELEGY ELLIPTA ) 100-62.5-25 MCG/ACT AEPB INHALE 1 PUFF INTO LUNGS ONCE DAILY IN  THE  AFTERNOON 1 each 2   gabapentin  (NEURONTIN ) 300 MG capsule Take 1 capsule (300 mg total) by mouth at bedtime. 90 capsule 1   losartan  (COZAAR ) 50 MG tablet Take 1 tablet (50 mg total) by mouth daily. 90 tablet 3   MAGNESIUM -OXIDE 400 (240 Mg) MG tablet Take 1 tablet (400 mg total) by mouth daily. 30 tablet 3   ondansetron  (ZOFRAN ) 4 MG tablet Take 1 tablet (4 mg total) by mouth every 8 (eight) hours as needed for nausea or vomiting. 30 tablet 1   potassium chloride  SA (KLOR-CON  M) 20 MEQ tablet Take 1 tablet (20 mEq total) by mouth daily. 90 tablet 1   rosuvastatin  (CRESTOR ) 20 MG tablet Take 1 tablet (20 mg total) by mouth daily. 90 tablet 0   No current facility-administered medications on file prior to visit.    Allergies:   Allergies  Allergen Reactions   Codeine Itching   Sulfa Antibiotics Itching   Pholcodine Hives and Itching   Spironolactone      Electrolyte imbalance   Zetia  [Ezetimibe ] Swelling      Physical Exam Today's Vitals   07/06/23 1516  BP: (!) 141/84  Pulse: 88  Weight: 162 lb (73.5 kg)  Height: 5\' 3"  (1.6 m)   Body mass index is  28.7 kg/m.  General: well developed, well nourished pleasant middle-age Caucasian lady, seated, in no evident distress Head: head normocephalic and atraumatic.   Neck: supple with no carotid or supraclavicular bruits Cardiovascular: regular rate and rhythm, no murmurs Musculoskeletal: no deformity Skin:  no rash/petichiae Vascular:  Normal pulses all extremities  Neurologic Exam Mental Status: Awake and fully alert.  Fluent speech and language.  Oriented to place and time. Recent memory mildly impaired and remote memory intact. Attention span, concentration and fund of knowledge appropriate. Mood and affect appropriate.  Cranial Nerves: Pupils equal, briskly reactive to light. Extraocular movements full  without nystagmus. Visual fields full to confrontation. Hearing intact. Facial sensation intact. Face, tongue, palate moves normally and symmetrically.  Motor: Normal bulk and tone. Normal strength in all tested extremity muscles. Sensory.: intact to touch , pinprick , position and vibratory sensation.  Coordination: Rapid alternating movements normal in all extremities. Finger-to-nose and heel-to-shin performed accurately bilaterally. Gait and Station: Arises from chair without difficulty. Stance is normal. Gait demonstrates antalgic gait with mild favoring of LLE due to knee pain Reflexes: 1+ and symmetric. Toes downgoing.        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  Vascular risk factors of atrial fibrillation, mild hyperlipidemia, extracranial atherosclerosis, ongoing tobacco abuse and remote substance abuse and small PFO.  Complains of tension type headaches over the past 3 to 4 months, suspect component of rebound headache with daily OTC use.      PLAN:  - start amitriptyline 25mg  nightly for tension type headaches, also discussed limiting tylenol  use as suspect component of analgesic rebound headache.  No red flag symptoms and neuroexam intact, do not feel imaging warranted at this time. - Continue Eliquis  5 mg twice daily and Crestor  20 mg daily managed/prescribed by PCP/cardiology - Continue routine cardiology follow-up for atrial fibrillation and Eliquis  management - Continue close PCP follow-up for aggressive stroke risk factor management - Discussed importance of complete tobacco cessation and risks of continued use and to follow-up with PCP if assistance is needed    Follow-up in 7 months or call earlier if needed     I spent 30 minutes of face-to-face and non-face-to-face time with patient.  This included previsit  chart review, lab review, study review, order entry, electronic health record documentation, patient education and discussion regarding above diagnoses and treatment plan and answered all other questions to patient's satisfaction  Johny Nap, [facility]  [facility] [address] [place], [place] [address]  Phone [phone] Fax [phone] Note: This document was prepared with digital dictation and possible smart phrase technology. Any transcriptional errors that result from this process are unintentional.

## 2023-07-13 ENCOUNTER — Ambulatory Visit (INDEPENDENT_AMBULATORY_CARE_PROVIDER_SITE_OTHER): Payer: BC Managed Care – PPO

## 2023-07-13 DIAGNOSIS — I639 Cerebral infarction, unspecified: Secondary | ICD-10-CM

## 2023-07-13 LAB — CUP PACEART REMOTE DEVICE CHECK
Date Time Interrogation Session: 20250526233515
Implantable Pulse Generator Implant Date: 20240610

## 2023-07-14 ENCOUNTER — Ambulatory Visit: Payer: Self-pay | Admitting: Cardiology

## 2023-07-15 ENCOUNTER — Ambulatory Visit: Payer: Self-pay | Admitting: Family Medicine

## 2023-07-19 LAB — LAB REPORT - SCANNED: EGFR: 74

## 2023-07-21 ENCOUNTER — Telehealth: Payer: Self-pay

## 2023-07-21 NOTE — Telephone Encounter (Signed)
 Copied from CRM [id number]. Topic: Clinical - Lab/Test Results >> Jul 21, 2023  1:48 PM Sophia H wrote: Reason for CRM: Pt returning missed call. [facility] per note in chart "Lung ct scan: no evidence of lung cancer.  Shows cholesterol build up in coronary arteries and the aorta. Recommend increase crestor  to 40 mg before bed.  Emphysema (copd) - on treatment. Repeat in one year." Patient states she has been in the hospital since Monday, has pneumonia and they are wanting to do a biopsy on her lung. States she had been passing out at home and did not realize it. She was told she will likely be there for the next two weeks, [facility] Just wanted pcp to be aware.

## 2023-07-25 ENCOUNTER — Other Ambulatory Visit: Payer: Self-pay

## 2023-07-25 ENCOUNTER — Emergency Department (HOSPITAL_COMMUNITY)

## 2023-07-25 ENCOUNTER — Encounter (HOSPITAL_COMMUNITY): Payer: Self-pay | Admitting: *Deleted

## 2023-07-25 ENCOUNTER — Emergency Department (HOSPITAL_COMMUNITY)
Admission: EM | Admit: 2023-07-25 | Discharge: 2023-07-25 | Disposition: A | Discharge status: Home / Self Care | Attending: Emergency Medicine | Admitting: Emergency Medicine

## 2023-07-25 DIAGNOSIS — I110 Hypertensive heart disease with heart failure: Secondary | ICD-10-CM | POA: Insufficient documentation

## 2023-07-25 DIAGNOSIS — I509 Heart failure, unspecified: Secondary | ICD-10-CM | POA: Insufficient documentation

## 2023-07-25 DIAGNOSIS — Z7901 Long term (current) use of anticoagulants: Secondary | ICD-10-CM | POA: Diagnosis not present

## 2023-07-25 DIAGNOSIS — J449 Chronic obstructive pulmonary disease, unspecified: Secondary | ICD-10-CM | POA: Insufficient documentation

## 2023-07-25 DIAGNOSIS — Z8673 Personal history of transient ischemic attack (TIA), and cerebral infarction without residual deficits: Secondary | ICD-10-CM | POA: Insufficient documentation

## 2023-07-25 DIAGNOSIS — J189 Pneumonia, unspecified organism: Secondary | ICD-10-CM | POA: Insufficient documentation

## 2023-07-25 DIAGNOSIS — Z79899 Other long term (current) drug therapy: Secondary | ICD-10-CM | POA: Insufficient documentation

## 2023-07-25 DIAGNOSIS — E876 Hypokalemia: Secondary | ICD-10-CM | POA: Insufficient documentation

## 2023-07-25 DIAGNOSIS — R4182 Altered mental status, unspecified: Secondary | ICD-10-CM | POA: Diagnosis present

## 2023-07-25 LAB — CBC
HCT: 29.6 % — ABNORMAL LOW (ref 36.0–46.0)
Hemoglobin: 9.1 g/dL — ABNORMAL LOW (ref 12.0–15.0)
MCH: 27.7 pg (ref 26.0–34.0)
MCHC: 30.7 g/dL (ref 30.0–36.0)
MCV: 90.2 fL (ref 80.0–100.0)
Platelets: 562 10*3/uL — ABNORMAL HIGH (ref 150–400)
RBC: 3.28 MIL/uL — ABNORMAL LOW (ref 3.87–5.11)
RDW: 16 % — ABNORMAL HIGH (ref 11.5–15.5)
WBC: 9.6 10*3/uL (ref 4.0–10.5)
nRBC: 0 % (ref 0.0–0.2)

## 2023-07-25 LAB — BASIC METABOLIC PANEL WITH GFR
Anion gap: 10 (ref 5–15)
BUN: 6 mg/dL (ref 6–20)
CO2: 30 mmol/L (ref 22–32)
Calcium: 8.3 mg/dL — ABNORMAL LOW (ref 8.9–10.3)
Chloride: 99 mmol/L (ref 98–111)
Creatinine, Ser: 0.85 mg/dL (ref 0.44–1.00)
GFR, Estimated: >60 mL/min (ref >60)
Glucose, Bld: 127 mg/dL — ABNORMAL HIGH (ref 70–99)
Potassium: 2.8 mmol/L — ABNORMAL LOW (ref 3.5–5.1)
Sodium: 139 mmol/L (ref 135–145)

## 2023-07-25 LAB — TROPONIN I (HIGH SENSITIVITY)
Troponin I (High Sensitivity): 11 ng/L (ref <18)
Troponin I (High Sensitivity): 11 ng/L (ref <18)

## 2023-07-25 MED ORDER — FLUCONAZOLE 150 MG PO TABS: 150.0000 mg | ORAL_TABLET | Freq: Every day | ORAL | 0 refills | Status: DC

## 2023-07-25 MED ORDER — POTASSIUM CHLORIDE CRYS ER 20 MEQ PO TBCR
40.0000 meq | EXTENDED_RELEASE_TABLET | Freq: Once | ORAL | Status: AC
  Administered 2023-07-25: 40 meq via ORAL
  Filled 2023-07-25: qty 2

## 2023-07-25 MED ORDER — AMOXICILLIN-POT CLAVULANATE 875-125 MG PO TABS: 1.0000 | ORAL_TABLET | Freq: Two times a day (BID) | ORAL | 0 refills | Status: DC

## 2023-07-25 NOTE — ED Provider Notes (Signed)
 [facility] EMERG[facility]Y DEPARTMENT AT [facility] Provider Note   CSN: [id number] Arrival date & time: 07/25/23  0053     History  Chief Complaint  Patient presents with   Pneumonia    Mary Franco is a 58 y.o. female.   Pneumonia     Patient has a history of migraines COPD fibromyalgia CHF, hypertension, prior stroke.  Patient states she was recently admitted to [facility] for pneumonia.  Those records are not available.  From her description of her presentation it sounds like she was admitted to the intensive care unit.  Patient had altered mental status.  Patient was being treated with IV antibiotics.  She also had to get medications for agitation as she said they gave her Seroquel which does not agree with her.  Patient ultimately left AMA before completing her care.  Patient states she was having issues with frequent blood draws.  She was developing bruising and hematomas from the blood draw sites.  Patient states she was told that she is still needed to get IV antibiotics and they were going to have a bronchoscopy performed.Patient left the hospital couple days ago.  She has not had any fevers since.  She is not have any difficulty breathing.  She came to the ED today to get reevaluated  Home Medications Prior to Admission medications   Medication Sig Start Date End Date Taking? Authorizing Provider  amoxicillin -clavulanate (AUGMENTIN ) 875-125 MG tablet Take 1 tablet by mouth every 12 (twelve) hours. 07/25/23  Yes Trish Furl, MD  fluconazole  (DIFLUCAN ) 150 MG tablet Take 1 tablet (150 mg total) by mouth daily. 07/25/23  Yes Trish Furl, MD  acetaminophen  (TYLENOL ) 500 MG tablet Take 1,000 mg by mouth every 6 (six) hours as needed for moderate pain.    [provider]  albuterol  (PROVENTIL ) (2.5 MG/3ML) 0.083% nebulizer solution USE 1 VIAL IN NEBULIZER EVERY 4 TO 6 HOURS AS NEEDED FOR SHORTNESS OF BREATH FOR WHEEZING FOR 5 DAYS 08/14/22   [provider]  albuterol  (VENTOLIN  HFA) 108 (90 Base) MCG/ACT inhaler INHALE 2 PUFFS BY MOUTH EVERY 6 HOURS AS NEEDED FOR WHEEZING OR SHORTNESS OF BREATH 02/28/23   Cyndi Drain, PA-C  alprazolam  (XANAX ) 2 MG tablet Take 2 mg by mouth at bedtime.    [provider]  amitriptyline  (ELAVIL ) 25 MG tablet Take 1 tablet (25 mg total) by mouth at bedtime. 07/06/23   Johny Nap, NP  apixaban  (ELIQUIS ) 5 MG TABS tablet Take 1 tablet (5 mg total) by mouth 2 (two) times daily. 07/01/23   Cyndi Drain, PA-C  B Complex Vitamins (VITAMIN B COMPLEX PO) Take by mouth.    [provider]  bisoprolol  (ZEBETA ) 10 MG tablet Take 2 tablets (20 mg total) by mouth daily. 03/08/23   Cyndi Drain, PA-C  cetirizine  (ZYRTEC  ALLERGY) 10 MG tablet Take 1 tablet (10 mg total) by mouth daily. 08/04/22   Cyndi Drain, PA-C  cholecalciferol (VITAMIN D3) 25 MCG (1000 UNIT) tablet Take 1,000 Units by mouth daily.    [provider]  esomeprazole  (NEXIUM ) 40 MG capsule Take 40 mg by mouth daily at 12 noon.    [provider]  Fluticasone -Umeclidin-Vilant (TRELEGY ELLIPTA ) 100-62.5-25 MCG/ACT AEPB INHALE 1 PUFF INTO LUNGS ONCE DAILY IN  THE  AFTERNOON 04/12/23   Cyndi Drain, PA-C  gabapentin  (NEURONTIN ) 300 MG capsule Take 1 capsule (300 mg total) by mouth at bedtime. 02/25/23   Cyndi Drain, PA-C  losartan  (COZAAR ) 50 MG tablet  Take 1 tablet (50 mg total) by mouth daily. 11/02/22   Revankar, Micael Adas, MD  MAGNESIUM -OXIDE 400 (240 Mg) MG tablet Take 1 tablet (400 mg total) by mouth daily. 03/31/23   Cyndi Drain, PA-C  ondansetron  (ZOFRAN ) 4 MG tablet Take 1 tablet (4 mg total) by mouth every 8 (eight) hours as needed for nausea or vomiting. 04/29/21   Allegra Arch, NP  potassium chloride  SA (KLOR-CON  M) 20 MEQ tablet Take 1 tablet (20 mEq total) by mouth daily. 03/23/23   Cyndi Drain, PA-C  rosuvastatin  (CRESTOR ) 20 MG tablet Take 1 tablet (20 mg total) by mouth daily. 06/16/23   Cyndi Drain, PA-C      Allergies     Codeine, Sulfa antibiotics, Pholcodine, Spironolactone , and Zetia  [ezetimibe ]    Review of Systems   Review of Systems  Physical Exam Updated Vital Signs BP (!) 142/75   Pulse 71   Temp 97.9 F (36.6 C)   Resp 16   Ht 1.6 m (5\' 3" )   Wt 73.5 kg   SpO2 93%   BMI 28.70 kg/m  Physical Exam Vitals and nursing note reviewed.  Constitutional:      General: She is not in acute distress.    Appearance: She is well-developed.  HENT:     Head: Normocephalic and atraumatic.     Right Ear: External ear normal.     Left Ear: External ear normal.  Eyes:     General: No scleral icterus.       Right eye: No discharge.        Left eye: No discharge.     Conjunctiva/sclera: Conjunctivae normal.  Neck:     Trachea: No tracheal deviation.  Cardiovascular:     Rate and Rhythm: Normal rate and regular rhythm.  Pulmonary:     Effort: Pulmonary effort is normal. No respiratory distress.     Breath sounds: Normal breath sounds. No stridor. No wheezing or rales.  Abdominal:     General: Bowel sounds are normal. There is no distension.     Palpations: Abdomen is soft.     Tenderness: There is no abdominal tenderness. There is no guarding or rebound.  Musculoskeletal:        General: No tenderness or deformity.     Cervical back: Neck supple.  Skin:    General: Skin is warm and dry.     Findings: No rash.  Neurological:     General: No focal deficit present.     Mental Status: She is alert.     Cranial Nerves: No cranial nerve deficit, dysarthria or facial asymmetry.     Sensory: No sensory deficit.     Motor: No abnormal muscle tone or seizure activity.     Coordination: Coordination normal.  Psychiatric:        Mood and Affect: Mood normal.     ED Results / Procedures / Treatments   Labs (all labs ordered are listed, but only abnormal results are displayed) Labs Reviewed  BASIC METABOLIC PANEL WITH GFR - Abnormal; Notable for the following components:      Result Value    Potassium 2.8 (*)    Glucose, Bld 127 (*)    Calcium  8.3 (*)    All other components within normal limits  CBC - Abnormal; Notable for the following components:   RBC 3.28 (*)    Hemoglobin 9.1 (*)    HCT 29.6 (*)    RDW 16.0 (*)    Platelets  562 (*)    All other components within normal limits  TROPONIN I (HIGH SENSITIVITY)  TROPONIN I (HIGH SENSITIVITY)    EKG None  Radiology DG Chest 2 View Result Date: 07/25/2023 CLINICAL DATA:  Chest pain recent diagnosis of pneumonia EXAM: CHEST - 2 VIEW COMPARISON:  07/21/2023 FINDINGS: Stable cardiomediastinal silhouette. Aortic atherosclerotic calcification. Loop recorder. Similar hazy airspace and interstitial opacities in the right upper lobe. The left lung is clear. No pleural effusion or pneumothorax. No displaced rib fracture. IMPRESSION: Right upper lobe airspace opacities similar to prior x-ray and CT. Differential considerations are pneumonia or neoplastic process. Follow-up to resolution is recommended. Electronically Signed   By: Rozell Cornet M.D.   On: 07/25/2023 02:00    Procedures Procedures    Medications Ordered in ED Medications  potassium chloride  SA (KLOR-CON  M) CR tablet 40 mEq (has no administration in time range)    ED Course/ Medical Decision Making/ A&P                                 Medical Decision Making Risk Prescription drug management.   Patient was recently admitted to the hospital for pneumonia per her report.  I do not have access to those records.  Patient is currently afebrile and nontoxic-appearing her chest x-ray shows an infiltrate in the right upper lobe that is reportedly similar to prior x-ray and CT that was performed at [facility].  With her recent pneumonia diagnosis I would not expect the x-ray to completely clear at this time.  Patient does not have any difficulty breathing.  She is afebrile and nontoxic.  I do not think she requires readmission to the hospital.  Patient will require  follow-up on this infiltrate to make sure it clears and there is no malignancy.  She had a lung cancer screening CT scan performed on May 9 and at that time there were no nodules measuring more than 5.6 mm.  I will give her a course of antibiotics.  Discussed her hypokalemia and we will give her a dose of potassium here.  Patient states she does not need a prescription because she has a potassium prescription to take.  I will also provide her the name of the [place] [facility] for follow-up.  Patient however think she will be able to follow-up with the lung doctor that she saw in the hospital at [facility]        Final Clinical Impression(s) / ED Diagnoses Final diagnoses:  Community acquired pneumonia of right upper lobe of lung    Rx / DC Orders ED Discharge Orders          Ordered    amoxicillin -clavulanate (AUGMENTIN ) 875-125 MG tablet  Every 12 hours        07/25/23 0903    fluconazole  (DIFLUCAN ) 150 MG tablet  Daily        07/25/23 0903              Trish Furl, MD 07/25/23 [phone]

## 2023-07-25 NOTE — Discharge Instructions (Addendum)
 Finish the course of oral antibiotics.  Follow-up with your doctor to recheck your potassium level next week  Follow-up with a pulmonary/lung doctor as we discussed for further evaluation of the infiltrate in your right lung.  Call the doctors here in [place] or follow up with the lung doctor that saw you while you were at [facility]

## 2023-07-25 NOTE — ED Triage Notes (Signed)
 The pt just spent 10 days in [place] hospital and  June 5th she signed herself out ama.  She still has chest discomfort dx pneumonia

## 2023-07-26 NOTE — Addendum Note (Signed)
 Addended by: Edra Govern D on: 07/26/2023 02:40 PM   Modules accepted: Orders

## 2023-07-26 NOTE — Progress Notes (Signed)
 Carelink Summary Report / Loop Recorder

## 2023-08-02 ENCOUNTER — Other Ambulatory Visit: Payer: Self-pay | Admitting: Physician Assistant

## 2023-08-02 ENCOUNTER — Encounter: Payer: Self-pay | Admitting: Physician Assistant

## 2023-08-02 ENCOUNTER — Ambulatory Visit: Payer: Self-pay

## 2023-08-02 ENCOUNTER — Ambulatory Visit: Admitting: Physician Assistant

## 2023-08-02 VITALS — BP 152/98 | HR 95 | Temp 97.5°F | Resp 20 | Ht 63.0 in | Wt 158.0 lb

## 2023-08-02 DIAGNOSIS — E876 Hypokalemia: Secondary | ICD-10-CM | POA: Diagnosis not present

## 2023-08-02 DIAGNOSIS — R410 Disorientation, unspecified: Secondary | ICD-10-CM

## 2023-08-02 DIAGNOSIS — J189 Pneumonia, unspecified organism: Secondary | ICD-10-CM | POA: Diagnosis not present

## 2023-08-02 DIAGNOSIS — I480 Paroxysmal atrial fibrillation: Secondary | ICD-10-CM

## 2023-08-02 DIAGNOSIS — J449 Chronic obstructive pulmonary disease, unspecified: Secondary | ICD-10-CM

## 2023-08-02 MED ORDER — TRELEGY ELLIPTA 100-62.5-25 MCG/ACT IN AEPB: 1.0000 | INHALATION_SPRAY | Freq: Every day | RESPIRATORY_TRACT | 2 refills | Status: DC

## 2023-08-02 MED ORDER — BISOPROLOL FUMARATE 10 MG PO TABS: 20.0000 mg | ORAL_TABLET | Freq: Every day | ORAL | 0 refills | Status: DC

## 2023-08-02 MED ORDER — BISOPROLOL FUMARATE 10 MG PO TABS: 20.0000 mg | ORAL_TABLET | Freq: Every day | ORAL | 1 refills | Status: AC

## 2023-08-02 MED ORDER — PROMETHAZINE-DM 6.25-15 MG/5ML PO SYRP: 5.0000 mL | ORAL_SOLUTION | Freq: Four times a day (QID) | ORAL | 0 refills | Status: DC | PRN

## 2023-08-02 NOTE — Progress Notes (Signed)
 Acute Office Visit  Subjective:    Patient ID: Mary Franco, female    DOB: 16-Feb-1966, 58 y.o.   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  Pt denies having this and then states she did not have a 'meth pipe on her' I did also address a finding of testing positive to cocaine in ED in 6/24 (which I was made aware of this past month) and she denies ever having cocaine She is agreeable for drug screen today  Pt was seen at [facility] ED on 07/25/23 and treated with Augmentin .  Since that time she states she is feeling better but still has residual cough.  Chest xray at that time still showed RLL pneumonia but with no new changes.  She denies fever or shortness of breath She was supposed to make a follow up appt with pulmonologist after her stay at [facility] but has not done that and requests our office to schedule with provider here in town.  She was a pt of Dr Waymond Hailey in [place] but has not been there in several years Pt has Trelegy to use for COPD but currently out and requests refill of med  Pt due for repeat labwork - has been taking supplements for her calcium , magnesium  and potassium   Current Outpatient Medications:    acetaminophen  (TYLENOL ) 500 MG tablet, Take 1,000 mg by mouth every 6 (six) hours as needed for moderate pain., Disp: , Rfl:    albuterol  (PROVENTIL ) (2.5 MG/3ML) 0.083% nebulizer solution, USE 1 VIAL IN NEBULIZER EVERY 4 TO 6 HOURS AS NEEDED FOR SHORTNESS OF BREATH FOR WHEEZING FOR 5 DAYS, Disp: , Rfl:    albuterol  (VENTOLIN  HFA) 108 (90 Base) MCG/ACT inhaler, INHALE 2 PUFFS BY MOUTH EVERY 6 HOURS AS NEEDED FOR WHEEZING OR SHORTNESS OF BREATH,  Disp: 9 g, Rfl: 0   alprazolam  (XANAX ) 2 MG tablet, Take 2 mg by mouth at bedtime., Disp: , Rfl:    amitriptyline  (ELAVIL ) 25 MG tablet, Take 1 tablet (25 mg total) by mouth at bedtime., Disp: 30 tablet, Rfl: 5   apixaban  (ELIQUIS ) 5 MG TABS tablet, Take 1 tablet (5 mg total) by mouth 2 (two) times daily., Disp: 60 tablet, Rfl: 3   B Complex Vitamins (VITAMIN B COMPLEX PO), Take by mouth., Disp: , Rfl:    cetirizine  (ZYRTEC  ALLERGY) 10 MG tablet, Take 1 tablet (10 mg total) by mouth daily., Disp: 30 tablet, Rfl: 3   cholecalciferol (VITAMIN D3) 25 MCG (1000 UNIT) tablet, Take 1,000 Units by mouth daily., Disp: , Rfl:    esomeprazole  (NEXIUM ) 40 MG capsule, Take 40 mg by mouth daily at 12 noon., Disp: , Rfl:    fluconazole  (DIFLUCAN ) 150 MG tablet, Take 1 tablet (150 mg total) by mouth daily., Disp: 1 tablet, Rfl: 0   gabapentin  (NEURONTIN ) 300 MG capsule, Take 1 capsule (300 mg total) by mouth at bedtime., Disp: 90 capsule, Rfl: 1   losartan  (COZAAR ) 50 MG tablet, Take 1 tablet (50 mg total) by mouth daily., Disp: 90 tablet, Rfl: 3   MAGNESIUM -OXIDE 400 (240 Mg) MG tablet, Take 1 tablet (400 mg total) by mouth daily., Disp: 30  tablet, Rfl: 3   ondansetron  (ZOFRAN ) 4 MG tablet, Take 1 tablet (4 mg total) by mouth every 8 (eight) hours as needed for nausea or vomiting., Disp: 30 tablet, Rfl: 1   potassium chloride  SA (KLOR-CON  M) 20 MEQ tablet, Take 1 tablet (20 mEq total) by mouth daily., Disp: 90 tablet, Rfl: 1   promethazine -dextromethorphan (PROMETHAZINE -DM) 6.25-15 MG/5ML syrup, Take 5 mLs by mouth 4 (four) times daily as needed., Disp: 118 mL, Rfl: 0   rosuvastatin  (CRESTOR ) 20 MG tablet, Take 1 tablet (20 mg total) by mouth daily., Disp: 90 tablet, Rfl: 0   bisoprolol  (ZEBETA ) 10 MG tablet, Take 2 tablets (20 mg total) by mouth daily., Disp: 60 tablet, Rfl: 1   Fluticasone -Umeclidin-Vilant (TRELEGY ELLIPTA ) 100-62.5-25 MCG/ACT AEPB, Take 1 Inhalation by mouth daily., Disp: 1 each, Rfl:  2  Allergies  Allergen Reactions   Codeine Itching   Sulfa Antibiotics Itching   Pholcodine Hives and Itching   Spironolactone      Electrolyte imbalance   Zetia  [Ezetimibe ] Swelling    ROS CONSTITUTIONAL: Negative for chills, fatigue, fever,  E/N/T: Negative for ear pain, nasal congestion and sore throat.  CARDIOVASCULAR: Negative for chest pain, dizziness, palpitations and pedal edema.  RESPIRATORY:see HPI GASTROINTESTINAL: Negative for abdominal pain, acid reflux symptoms, constipation, diarrhea, nausea and vomiting.  NEUROLOGICAL: Negative for dizziness and headaches.  PSYCHIATRIC: Negative for sleep disturbance and to question depression screen.  Negative for depression, negative for anhedonia.      Objective:    PHYSICAL EXAM:   BP (!) 152/98   Pulse 95   Temp (!) 97.5 F (36.4 C) (Temporal)   Resp 20   Ht 5' 3 (1.6 m)   Wt 158 lb (71.7 kg)   SpO2 96%   BMI 27.99 kg/m    GEN: Well nourished, well developed, in no acute distress  HEENT: normal external ears and nose - normal external auditory canals and TMS -- Lips, Teeth and Gums - normal  Oropharynx - normal mucosa, palate, and posterior pharynx Cardiac: RRR; no murmurs, rubs, or gallops,no edema - Respiratory:  normal respiratory rate and pattern with no distress - normal breath sounds with no rales, rhonchi, wheezes or rubs  Psych: euthymic mood, appropriate affect and demeanor     Assessment & Plan:    Hypokalemia -     Comprehensive metabolic panel with GFR  Hypomagnesemia -     Magnesium   Hypercalcemia -     Comprehensive metabolic panel with GFR  Pneumonia of right lower lobe due to infectious organism -     CBC with Differential/Platelet  Mental confusion -     Drug Screen 10 W/Conf, Serum   Chronic obstructive pulmonary disease, unspecified COPD type (HCC) -     Trelegy Ellipta ; Take 1 Inhalation by mouth daily.  Dispense: 1 each; Refill: 2 -     Pulmonary Visit - referral to  pulmonology  Other orders -     Promethazine -DM; Take 5 mLs by mouth 4 (four) times daily as needed.  Dispense: 118 mL; Refill: 0     Follow-up: Return for as scheduled for chronic visit.  An After Visit Summary was printed and given to the patient.  Anthonette Bastos [facility] [phone]

## 2023-08-02 NOTE — Telephone Encounter (Signed)
 FYI Only or Action Required?: FYI only for provider  Patient was last seen in primary care on 06/15/2023 by Cyndi Drain, PA-C. Called Nurse Triage reporting Cough. Symptoms began several weeks ago. Interventions attempted: Nothing. Symptoms are: stable.  Triage Disposition: No disposition on file.  Patient/caregiver understands and will follow disposition?:     Copied from CRM [id number]. Topic: Clinical - Red Word Triage >> Aug 02, 2023 10:35 AM Mary Franco wrote: Red Word that prompted transfer to Nurse Triage: chest hurts, had been admitted with pneumonia but left AMA, just finished antibiotics, still coughing and short of breath Answer Assessment - Initial Assessment Questions 1. SYMPTOM: What's the main symptom you're concerned about? (e.g., breathing difficulty, fever, weakness)     -------- Cough,    2. ONSET: When did the  start?     -------- a few weeks ago  3. BETTER-SAME-WORSE: Are you getting better, staying the same, or getting worse compared to the day you were discharged?     ------ Same symptoms     4. HOSPITALIZATION: How long were you hospitalized? (e.g., days)      -------------------- 07/25/23    5. DISCHARGE DATE: What date were you discharged from the hospital?     -----------------------   6. DISCHARGE DOCTOR: Who is the main doctor taking care of you now?     -----See chart     7. DISCHARGE APPOINTMENT: Have you scheduled (or already had) a follow-up discharge appointment with your doctor?    ----- Denies       9. DISCHARGE ANTIBIOTICS: Are you taking any antibiotic medicine now?      --- Amox,    10. BREATHING DIFFICULTY: Are you having any difficulty breathing? If Yes, ask: How bad is it?  (e.g., none, mild, moderate, severe)   - MILD: No SOB at rest, mild SOB with walking, speaks normally in sentences, can lie down, no retractions, pulse < 100.    - MODERATE: SOB at rest, SOB with minimal exertion and prefers to sit, cannot lie  down flat, speaks in phrases, mild retractions, audible wheezing, pulse 100-120.    - SEVERE: Very SOB at rest, speaks in single words, struggling to breathe, sitting hunched forward, retractions, pulse > 120         --- Mild: SOB is chronic and not worsened since ED discharge Patient able to articulate needs fully during the call/with full sentences.     11. FEVER: Do you have a fever? If Yes, ask: What is it, how was it measured and when did it start?      -------------- Denies    12. OTHER SYMPTOMS: Do you have any other symptoms? (e.g., weakness, confusion, pain)       ----------- Intermittent Cough and R. Chest pain ( intermittent, last )  - Neither are acute, same symptoms that brought her to the ED. Pt confirms none of her symptoms have worsened, they are all the same ones I had when I went to the ED    Additional information:  Patient was recently seen in the ED and DX with Community Acquired PNA.  Patient is calling to schedule an Office visit, post discharge. Patient confirms she still has an intermittent cough and intermittent chest pain, and none of her symptoms have worsened.  Patient was d/c with antibiotics and has completed them.   Patient appointment scheduled appropriately.  Protocols used: Pneumonia on Antibiotic Post-Hospitalization Follow-up Call-A-AH

## 2023-08-02 NOTE — Telephone Encounter (Signed)
 Copied from CRM [id number]. Topic: Clinical - Medication Refill >> Aug 02, 2023 10:30 AM Star East wrote: Medication: bisoprolol  (ZEBETA ) 10 MG tablet  Has the patient contacted their pharmacy? Yes (Agent: If no, request that the patient contact the pharmacy for the refill. If patient does not wish to contact the pharmacy document the reason why and proceed with request.) (Agent: If yes, when and what did the pharmacy advise?)  This is the patient's preferred pharmacy:  [facility] [address], [place] - [address] [facility] [place] [address] Phone: [phone] Fax: [phone]   Is this the correct pharmacy for this prescription? Yes If no, delete pharmacy and type the correct one.   Has the prescription been filled recently? Yes  Is the patient out of the medication? Yes  Has the patient been seen for an appointment in the last year OR does the patient have an upcoming appointment? Yes  Can we respond through MyChart? Yes  Agent: Please be advised that Rx refills may take up to 3 business days. We ask that you follow-up with your pharmacy.

## 2023-08-03 ENCOUNTER — Telehealth: Payer: Self-pay

## 2023-08-03 ENCOUNTER — Other Ambulatory Visit: Payer: Self-pay

## 2023-08-03 ENCOUNTER — Other Ambulatory Visit: Payer: Self-pay | Admitting: Physician Assistant

## 2023-08-03 DIAGNOSIS — J449 Chronic obstructive pulmonary disease, unspecified: Secondary | ICD-10-CM

## 2023-08-03 NOTE — Telephone Encounter (Signed)
 Patient has been approved for Bisoprolol  Fumarate 10 mg through 06.17.2026.

## 2023-08-03 NOTE — Telephone Encounter (Signed)
 Please notify pt magnesium  level low at 0.9 If she is having weakness, lethargy , severe muscle pains or chest pains  recommend to go to ED for further evaluation (this is chronic finding for her and she was in office yesterday without these symptoms) Is she taking her magnesium  supplement as directed? - how is she taking it?? Also I referred her to GI and kidney specialist for this issue several weeks ago --- has she seen either provider yet or has upcoming appt? Recommend to repeat cmp tomorrow if not feeling bad enough to go to ED

## 2023-08-03 NOTE — Telephone Encounter (Signed)
 Copied from CRM [id number]. Topic: General - Other >> Aug 03, 2023 11:55 AM Emylou G wrote: Reason for CRM: Abraham Hoffmann w/[facility] Abnormal Lab results - Alert. Magnesium  0.9 Released details for me to send

## 2023-08-03 NOTE — Telephone Encounter (Signed)
 Provider called patient left voicemail that she spoke with patient pharmacy and they stated patient is over due to pick up her magnesium  and patient needs to go pick up medication and start taking it due to her levels being low, a lot for patient to call office back, and schedule lab visit to check magnesium  next week.

## 2023-08-04 ENCOUNTER — Telehealth: Payer: Self-pay

## 2023-08-04 NOTE — Telephone Encounter (Unsigned)
 Copied from CRM [id number]. Topic: Clinical - Lab/Test Results >> Aug 03, 2023  5:02 PM Santiya F wrote: Reason for CRM: Patient is calling in because she received a message from her provider with her results. Patient says she is taking her magnesium  and she hasn't picked up the ones from the pharmacy because she still has some left and was waiting for her other medications. Patient also has questions regarding her vitamin D .

## 2023-08-04 NOTE — Telephone Encounter (Signed)
 Please call pt and see what questions she has Have her come in first of week to repeat labwork if she is asymptomatic

## 2023-08-04 NOTE — Telephone Encounter (Signed)
Called left message to call office back

## 2023-08-09 ENCOUNTER — Other Ambulatory Visit

## 2023-08-09 ENCOUNTER — Ambulatory Visit: Payer: Self-pay | Admitting: Family Medicine

## 2023-08-09 LAB — BENZODIAZEPINES,MS,WB/SP RFX
7-Aminoclonazepam: NEGATIVE ng/mL
Alprazolam: 61.2 ng/mL
Benzodiazepines Confirm: POSITIVE
Chlordiazepoxide: NEGATIVE
Clonazepam: NEGATIVE ng/mL
Desalkylflurazepam: NEGATIVE ng/mL
Desmethylchlordiazepoxide: NEGATIVE
Desmethyldiazepam: 23 ng/mL
Diazepam: NEGATIVE ng/mL
Flurazepam: NEGATIVE ng/mL
Lorazepam: NEGATIVE ng/mL
Midazolam: NEGATIVE ng/mL
Oxazepam: NEGATIVE ng/mL
Temazepam: NEGATIVE ng/mL
Triazolam: NEGATIVE ng/mL

## 2023-08-09 LAB — COMPREHENSIVE METABOLIC PANEL WITH GFR
ALT: 12 IU/L (ref 0–32)
AST: 19 IU/L (ref 0–40)
Albumin: 3.8 g/dL (ref 3.8–4.9)
Alkaline Phosphatase: 98 IU/L (ref 44–121)
BUN/Creatinine Ratio: 10 (ref 9–23)
BUN: 9 mg/dL (ref 6–24)
Bilirubin Total: 0.2 mg/dL (ref 0.0–1.2)
CO2: 24 mmol/L (ref 20–29)
Calcium: 7.7 mg/dL — ABNORMAL LOW (ref 8.7–10.2)
Chloride: 98 mmol/L (ref 96–106)
Creatinine, Ser: 0.88 mg/dL (ref 0.57–1.00)
Globulin, Total: 3.2 g/dL (ref 1.5–4.5)
Glucose: 103 mg/dL — ABNORMAL HIGH (ref 70–99)
Potassium: 3.5 mmol/L (ref 3.5–5.2)
Sodium: 141 mmol/L (ref 134–144)
Total Protein: 7 g/dL (ref 6.0–8.5)
eGFR: 76 mL/min/{1.73_m2} (ref >59)

## 2023-08-09 LAB — DRUG SCREEN 10 W/CONF, SERUM
Amphetamines, IA: POSITIVE ng/mL — AB
Barbiturates, IA: NEGATIVE ug/mL
Benzodiazepines, IA: POSITIVE ng/mL — AB
Cocaine & Metabolite, IA: NEGATIVE ng/mL
Methadone, IA: NEGATIVE ng/mL
Opiates, IA: NEGATIVE ng/mL
Oxycodones, IA: NEGATIVE ng/mL
Phencyclidine, IA: NEGATIVE ng/mL
Propoxyphene, IA: NEGATIVE ng/mL
THC(Marijuana) Metabolite, IA: NEGATIVE ng/mL

## 2023-08-09 LAB — CBC WITH DIFFERENTIAL/PLATELET
Basophils Absolute: 0 10*3/uL (ref 0.0–0.2)
Basos: 1 %
EOS (ABSOLUTE): 0.1 10*3/uL (ref 0.0–0.4)
Eos: 1 %
Hematocrit: 33.6 % — ABNORMAL LOW (ref 34.0–46.6)
Hemoglobin: 10.3 g/dL — ABNORMAL LOW (ref 11.1–15.9)
Immature Grans (Abs): 0 10*3/uL (ref 0.0–0.1)
Immature Granulocytes: 0 %
Lymphocytes Absolute: 2.2 10*3/uL (ref 0.7–3.1)
Lymphs: 33 %
MCH: 27.5 pg (ref 26.6–33.0)
MCHC: 30.7 g/dL — ABNORMAL LOW (ref 31.5–35.7)
MCV: 90 fL (ref 79–97)
Monocytes Absolute: 0.5 10*3/uL (ref 0.1–0.9)
Monocytes: 8 %
Neutrophils Absolute: 3.8 10*3/uL (ref 1.4–7.0)
Neutrophils: 56 %
Platelets: 545 10*3/uL — ABNORMAL HIGH (ref 150–450)
RBC: 3.74 x10E6/uL — ABNORMAL LOW (ref 3.77–5.28)
RDW: 15.2 % (ref 11.7–15.4)
WBC: 6.7 10*3/uL (ref 3.4–10.8)

## 2023-08-09 LAB — AMPHETAMINES/MDMA,MS,WB/SP RFX
Amphetamine: 19 ng/mL
Amphetamines Confirmation: POSITIVE
MDA: NEGATIVE ng/mL
MDEA: NEGATIVE ng/mL
MDMA: NEGATIVE ng/mL
Methamphetamine: 290 ng/mL

## 2023-08-09 LAB — MAGNESIUM: Magnesium: 0.9 mg/dL — CL (ref 1.6–2.3)

## 2023-08-10 ENCOUNTER — Encounter: Payer: Self-pay | Admitting: Physician Assistant

## 2023-08-10 ENCOUNTER — Other Ambulatory Visit

## 2023-08-10 NOTE — Telephone Encounter (Signed)
Dismissal letter has been completed.

## 2023-08-11 LAB — MAGNESIUM: Magnesium: 1.1 mg/dL — ABNORMAL LOW (ref 1.6–2.3)

## 2023-08-12 ENCOUNTER — Ambulatory Visit (INDEPENDENT_AMBULATORY_CARE_PROVIDER_SITE_OTHER): Payer: Self-pay

## 2023-08-12 ENCOUNTER — Ambulatory Visit: Payer: Self-pay | Admitting: Physician Assistant

## 2023-08-12 DIAGNOSIS — I639 Cerebral infarction, unspecified: Secondary | ICD-10-CM

## 2023-08-12 LAB — CUP PACEART REMOTE DEVICE CHECK
Date Time Interrogation Session: 20250625234413
Implantable Pulse Generator Implant Date: 20240610

## 2023-08-15 ENCOUNTER — Ambulatory Visit: Payer: Self-pay | Admitting: Cardiology

## 2023-08-16 ENCOUNTER — Inpatient Hospital Stay: Admitting: Physician Assistant

## 2023-08-24 ENCOUNTER — Other Ambulatory Visit: Payer: Self-pay | Admitting: Physician Assistant

## 2023-08-24 DIAGNOSIS — J449 Chronic obstructive pulmonary disease, unspecified: Secondary | ICD-10-CM

## 2023-08-24 DIAGNOSIS — R0609 Other forms of dyspnea: Secondary | ICD-10-CM

## 2023-09-01 NOTE — Progress Notes (Signed)
 Carelink Summary Report / Loop Recorder

## 2023-09-10 ENCOUNTER — Other Ambulatory Visit: Payer: Self-pay | Admitting: Physician Assistant

## 2023-09-13 ENCOUNTER — Ambulatory Visit (INDEPENDENT_AMBULATORY_CARE_PROVIDER_SITE_OTHER): Payer: Self-pay

## 2023-09-13 DIAGNOSIS — I639 Cerebral infarction, unspecified: Secondary | ICD-10-CM | POA: Diagnosis not present

## 2023-09-13 LAB — CUP PACEART REMOTE DEVICE CHECK
Date Time Interrogation Session: 20250727234308
Implantable Pulse Generator Implant Date: 20240610

## 2023-09-14 ENCOUNTER — Ambulatory Visit: Payer: Self-pay | Admitting: Cardiology

## 2023-09-28 ENCOUNTER — Encounter (HOSPITAL_BASED_OUTPATIENT_CLINIC_OR_DEPARTMENT_OTHER): Payer: Self-pay | Admitting: Family Medicine

## 2023-09-28 ENCOUNTER — Ambulatory Visit (HOSPITAL_BASED_OUTPATIENT_CLINIC_OR_DEPARTMENT_OTHER): Admitting: Family Medicine

## 2023-09-28 VITALS — BP 144/89 | HR 64 | Temp 97.6°F | Resp 17 | Ht 62.99 in | Wt 164.0 lb

## 2023-09-28 DIAGNOSIS — I10 Essential (primary) hypertension: Secondary | ICD-10-CM

## 2023-09-28 DIAGNOSIS — J42 Unspecified chronic bronchitis: Secondary | ICD-10-CM | POA: Diagnosis not present

## 2023-09-28 DIAGNOSIS — E782 Mixed hyperlipidemia: Secondary | ICD-10-CM | POA: Diagnosis not present

## 2023-09-28 DIAGNOSIS — I480 Paroxysmal atrial fibrillation: Secondary | ICD-10-CM | POA: Diagnosis not present

## 2023-09-28 DIAGNOSIS — K589 Irritable bowel syndrome without diarrhea: Secondary | ICD-10-CM

## 2023-09-28 DIAGNOSIS — Z720 Tobacco use: Secondary | ICD-10-CM

## 2023-09-28 NOTE — Progress Notes (Signed)
 Established Patient Office Visit  Subjective   Patient ID: Mary Franco, female    DOB: 06/24/1965  Age: 58 y.o. MRN: [id number]  Chief Complaint  Patient presents with   Establish Care    Establish care     F/u as above.  New to my practice.  Known to [facility] previously.  Hospitalized about 2 mos ago with Pneumonia at [facility].  Her anxiety is clearly a big issue for her.  She declines any meds in the SSRI or similar class.  Has been on Xanax  chronically and states she can get it from her GYN.  She declines more offer of a Psychiatry referral.    Past Medical History:  Diagnosis Date   Anxiety    Aortic atherosclerosis (HCC) 08/04/2022   ASD (atrial septal defect) 07/19/2018   Asthma    Chronic allergic rhinitis 08/04/2022   Cigarette smoker 03/07/2018   Congestive heart failure (HCC) 02/15/2018   COPD (chronic obstructive pulmonary disease) (HCC)    f/by Pulmonology in [facility]   Essential hypertension    GERD (gastroesophageal reflux disease)    Hyperlipidemia    Irritable bowel syndrome 06/11/2022   Major depressive disorder 09/15/2011   Migraines    f/by Neurology   Mixed dyslipidemia 02/11/2022   NICM (nonischemic cardiomyopathy) (HCC) 03/25/2018   Ovarian failure    Sees GYN in [place]   Panic disorder without agoraphobia 09/15/2011   Paroxysmal atrial fibrillation (HCC) 12/16/2022   f/by Dr. Edwyna   Pernicious anemia    PFO (patent foramen ovale) 08/04/2022   Thromboembolic stroke (HCC) 02/11/2022   f/by Neurology   Tobacco abuse    Vitamin B12 deficiency    Vitamin D  deficiency     Outpatient Encounter Medications as of 09/28/2023  Medication Sig   acetaminophen  (TYLENOL ) 500 MG tablet Take 1,000 mg by mouth every 6 (six) hours as needed for moderate pain.   albuterol  (PROVENTIL ) (2.5 MG/3ML) 0.083% nebulizer solution USE 1 VIAL IN NEBULIZER EVERY 4 TO 6 HOURS AS NEEDED FOR SHORTNESS OF BREATH FOR WHEEZING FOR 5 DAYS   albuterol  (VENTOLIN   HFA) 108 (90 Base) MCG/ACT inhaler INHALE 2 PUFFS BY MOUTH EVERY 6 HOURS AS NEEDED FOR WHEEZING OR SHORTNESS OF BREATH   alprazolam  (XANAX ) 2 MG tablet Take 2 mg by mouth at bedtime.   apixaban  (ELIQUIS ) 5 MG TABS tablet Take 1 tablet (5 mg total) by mouth 2 (two) times daily.   B Complex Vitamins (VITAMIN B COMPLEX PO) Take by mouth.   bisoprolol  (ZEBETA ) 10 MG tablet Take 2 tablets (20 mg total) by mouth daily.   cetirizine  (ZYRTEC  ALLERGY) 10 MG tablet Take 1 tablet (10 mg total) by mouth daily.   cholecalciferol (VITAMIN D3) 25 MCG (1000 UNIT) tablet Take 1,000 Units by mouth daily.   esomeprazole  (NEXIUM ) 40 MG capsule Take 40 mg by mouth daily at 12 noon.   fluconazole  (DIFLUCAN ) 150 MG tablet Take 1 tablet (150 mg total) by mouth daily.   Fluticasone -Umeclidin-Vilant (TRELEGY ELLIPTA ) 100-62.5-25 MCG/ACT AEPB Take 1 Inhalation by mouth daily.   gabapentin  (NEURONTIN ) 300 MG capsule Take 1 capsule (300 mg total) by mouth at bedtime.   losartan  (COZAAR ) 50 MG tablet Take 1 tablet (50 mg total) by mouth daily.   ondansetron  (ZOFRAN ) 4 MG tablet Take 1 tablet (4 mg total) by mouth every 8 (eight) hours as needed for nausea or vomiting.   potassium chloride  SA (KLOR-CON  M) 20 MEQ tablet Take 1 tablet (20  mEq total) by mouth daily.   predniSONE  (STERAPRED UNI-PAK 21 TAB) 5 MG (21) TBPK tablet Take by mouth.   promethazine -dextromethorphan (PROMETHAZINE -DM) 6.25-15 MG/5ML syrup Take 5 mLs by mouth 4 (four) times daily as needed.   rosuvastatin  (CRESTOR ) 20 MG tablet Take 1 tablet (20 mg total) by mouth daily.   amitriptyline  (ELAVIL ) 25 MG tablet Take 1 tablet (25 mg total) by mouth at bedtime. (Patient not taking: Reported on 09/28/2023)   MAGNESIUM -OXIDE 400 (240 Mg) MG tablet Take 1 tablet (400 mg total) by mouth daily. (Patient not taking: Reported on 09/28/2023)   No facility-administered encounter medications on file as of 09/28/2023.    Social History   Tobacco Use   Smoking status:  Every Day    Current packs/day: 0.50    Average packs/day: 1 pack/day for 88.6 years (84.3 ttl pk-yrs)    Types: Cigarettes    Start date: 28   Smokeless tobacco: Never  Vaping Use   Vaping status: Never Used  Substance Use Topics   Alcohol use: Not Currently    Comment: Recovering alcoholic   Drug use: No      Review of Systems  Constitutional:  Positive for malaise/fatigue. Negative for diaphoresis, fever and weight loss.  Respiratory:  Negative for cough, shortness of breath and wheezing.   Cardiovascular:  Negative for chest pain, palpitations, orthopnea, claudication, leg swelling and PND.      Objective:     BP (!) 144/89 (BP Location: Right Arm, Patient Position: Standing, Cuff Size: Normal)   Pulse 64   Temp 97.6 F (36.4 C) (Oral)   Resp 17   Ht 5' 2.99 (1.6 m)   Wt 164 lb (74.4 kg)   SpO2 94%   BMI 29.06 kg/m    Physical Exam Constitutional:      General: She is not in acute distress.    Appearance: Normal appearance.  HENT:     Head: Normocephalic.  Neck:     Vascular: No carotid bruit.  Cardiovascular:     Rate and Rhythm: Normal rate and regular rhythm.     Pulses: Normal pulses.     Heart sounds: Normal heart sounds.  Pulmonary:     Effort: Pulmonary effort is normal.     Breath sounds: Normal breath sounds.  Abdominal:     General: Bowel sounds are normal.     Palpations: Abdomen is soft.  Musculoskeletal:     Cervical back: Neck supple. No tenderness.     Right lower leg: No edema.     Left lower leg: No edema.  Neurological:     Mental Status: She is alert.      No results found for any visits on 09/28/23.    The ASCVD Risk score (Arnett DK, et al., 2019) failed to calculate for the following reasons:   Risk score cannot be calculated because patient has a medical history suggesting prior/existing ASCVD    Assessment & Plan:  Paroxysmal atrial fibrillation (HCC) Assessment & Plan: Stable.  F/u with Cardiology as  directed.  Her PMH is extensive and was reviewed thoroughly today.   Tobacco abuse Assessment & Plan: I urged cessation, though her interest is limited.  I'll spend more time on this in the coming weeks.   Chronic bronchitis, unspecified chronic bronchitis type (HCC) Assessment & Plan: Stable.   Essential hypertension Assessment & Plan: Mildly uncontrolled.  Will arrange for close f/u per [facility] protocol.   Mixed dyslipidemia  Irritable bowel syndrome without diarrhea  Return in about 4 weeks (around 10/26/2023) for chronic follow-up.    REDDING PONCE NORLEEN FALCON., MD

## 2023-09-29 ENCOUNTER — Encounter (HOSPITAL_BASED_OUTPATIENT_CLINIC_OR_DEPARTMENT_OTHER): Payer: Self-pay | Admitting: Family Medicine

## 2023-09-29 NOTE — Assessment & Plan Note (Signed)
 I urged cessation, though her interest is limited.  I'll spend more time on this in the coming weeks.

## 2023-09-29 NOTE — Assessment & Plan Note (Signed)
 Stable.  F/u with Cardiology as directed.  Her PMH is extensive and was reviewed thoroughly today.

## 2023-09-29 NOTE — Assessment & Plan Note (Signed)
 Mildly uncontrolled.  Will arrange for close f/u per [facility] protocol.

## 2023-09-29 NOTE — Assessment & Plan Note (Signed)
 Stable

## 2023-10-14 ENCOUNTER — Ambulatory Visit (INDEPENDENT_AMBULATORY_CARE_PROVIDER_SITE_OTHER): Payer: Self-pay

## 2023-10-14 DIAGNOSIS — I639 Cerebral infarction, unspecified: Secondary | ICD-10-CM | POA: Diagnosis not present

## 2023-10-14 LAB — CUP PACEART REMOTE DEVICE CHECK
Date Time Interrogation Session: 20250827233701
Implantable Pulse Generator Implant Date: 20240610

## 2023-10-20 ENCOUNTER — Ambulatory Visit: Admitting: Physician Assistant

## 2023-10-21 ENCOUNTER — Ambulatory Visit: Payer: Self-pay | Admitting: Cardiology

## 2023-10-26 ENCOUNTER — Encounter (HOSPITAL_BASED_OUTPATIENT_CLINIC_OR_DEPARTMENT_OTHER): Payer: Self-pay | Admitting: Family Medicine

## 2023-10-26 ENCOUNTER — Ambulatory Visit (HOSPITAL_BASED_OUTPATIENT_CLINIC_OR_DEPARTMENT_OTHER)
Admission: RE | Admit: 2023-10-26 | Discharge: 2023-10-26 | Disposition: A | Discharge status: Home / Self Care | Source: Ambulatory Visit | Attending: Family Medicine | Admitting: Family Medicine

## 2023-10-26 ENCOUNTER — Ambulatory Visit (HOSPITAL_BASED_OUTPATIENT_CLINIC_OR_DEPARTMENT_OTHER): Payer: Self-pay | Admitting: Family Medicine

## 2023-10-26 ENCOUNTER — Ambulatory Visit (HOSPITAL_BASED_OUTPATIENT_CLINIC_OR_DEPARTMENT_OTHER): Admitting: Family Medicine

## 2023-10-26 VITALS — BP 171/89 | HR 72 | Temp 97.7°F | Resp 16 | Wt 166.0 lb

## 2023-10-26 DIAGNOSIS — I10 Essential (primary) hypertension: Secondary | ICD-10-CM

## 2023-10-26 DIAGNOSIS — M79672 Pain in left foot: Secondary | ICD-10-CM

## 2023-10-26 DIAGNOSIS — J40 Bronchitis, not specified as acute or chronic: Secondary | ICD-10-CM

## 2023-10-26 DIAGNOSIS — J329 Chronic sinusitis, unspecified: Secondary | ICD-10-CM

## 2023-10-26 HISTORY — DX: Pain in left foot: M79.672

## 2023-10-26 MED ORDER — CEFDINIR 300 MG PO CAPS: 300.0000 mg | ORAL_CAPSULE | Freq: Two times a day (BID) | ORAL | 0 refills | Status: DC

## 2023-10-26 NOTE — Progress Notes (Unsigned)
 Established Patient Office Visit  Subjective   Patient ID: Mary Franco, female    DOB: 12/27/1965  Age: 58 y.o. MRN: [id number]  Chief Complaint  Patient presents with   Follow-up    Follow-up-visit    F/u as above.  She reports about a week of ST, cough, sinus congestion.  No recent antibiotics.  No fevers.  Also admits she recently injured her anterior Left foot.  It has been rather painful, though fortunately she denies any other joint pain at this time.  We continue to get caught up on her numerous concerns.    Past Medical History:  Diagnosis Date   Anxiety    Aortic atherosclerosis (HCC) 08/04/2022   ASD (atrial septal defect) 07/19/2018   Asthma    Chronic allergic rhinitis 08/04/2022   Congestive heart failure (HCC) 02/15/2018   COPD (chronic obstructive pulmonary disease) (HCC)    f/by Pulmonology in [facility]   Essential hypertension    GERD (gastroesophageal reflux disease)    Hyperlipidemia    Irritable bowel syndrome 06/11/2022   Major depressive disorder 09/15/2011   Migraines    f/by Neurology   NICM (nonischemic cardiomyopathy) (HCC) 03/25/2018   Ovarian failure    Sees GYN in [place]   Panic disorder without agoraphobia 09/15/2011   Paroxysmal atrial fibrillation (HCC) 12/16/2022   f/by Dr. Edwyna   Pernicious anemia    PFO (patent foramen ovale) 08/04/2022   Thromboembolic stroke (HCC) 02/11/2022   f/by Neurology   Tobacco abuse    Unable to quit   Vitamin D  deficiency     Outpatient Encounter Medications as of 10/26/2023  Medication Sig   acetaminophen  (TYLENOL ) 500 MG tablet Take 1,000 mg by mouth every 6 (six) hours as needed for moderate pain.   albuterol  (PROVENTIL ) (2.5 MG/3ML) 0.083% nebulizer solution USE 1 VIAL IN NEBULIZER EVERY 4 TO 6 HOURS AS NEEDED FOR SHORTNESS OF BREATH FOR WHEEZING FOR 5 DAYS   albuterol  (VENTOLIN  HFA) 108 (90 Base) MCG/ACT inhaler INHALE 2 PUFFS BY MOUTH EVERY 6 HOURS AS NEEDED FOR WHEEZING OR SHORTNESS OF  BREATH   alprazolam  (XANAX ) 2 MG tablet Take 2 mg by mouth at bedtime.   apixaban  (ELIQUIS ) 5 MG TABS tablet Take 1 tablet (5 mg total) by mouth 2 (two) times daily.   B Complex Vitamins (VITAMIN B COMPLEX PO) Take by mouth.   bisoprolol  (ZEBETA ) 10 MG tablet Take 2 tablets (20 mg total) by mouth daily.   budesonide -formoterol  (SYMBICORT ) 160-4.5 MCG/ACT inhaler Inhale into the lungs.   cefdinir  (OMNICEF ) 300 MG capsule Take 1 capsule (300 mg total) by mouth 2 (two) times daily.   cetirizine  (ZYRTEC  ALLERGY) 10 MG tablet Take 1 tablet (10 mg total) by mouth daily.   cholecalciferol (VITAMIN D3) 25 MCG (1000 UNIT) tablet Take 1,000 Units by mouth daily.   cyanocobalamin (VITAMIN B12) 1000 MCG tablet Take 1,000 mcg by mouth.   esomeprazole  (NEXIUM ) 40 MG capsule Take 40 mg by mouth daily at 12 noon.   fluconazole  (DIFLUCAN ) 150 MG tablet Take 1 tablet (150 mg total) by mouth daily.   gabapentin  (NEURONTIN ) 300 MG capsule Take 1 capsule (300 mg total) by mouth at bedtime.   ketoconazole (NIZORAL) 2 % shampoo SHAMPOO TOPICALLY ONCE DAILY AS NEEDED   magnesium  chloride (SLOW-MAG) 64 MG TBEC SR tablet Take 143 mg by mouth.   ondansetron  (ZOFRAN ) 4 MG tablet Take 1 tablet (4 mg total) by mouth every 8 (eight) hours as needed for nausea  or vomiting.   potassium chloride  SA (KLOR-CON  M) 20 MEQ tablet Take 1 tablet (20 mEq total) by mouth daily.   predniSONE  (STERAPRED UNI-PAK 21 TAB) 5 MG (21) TBPK tablet Take by mouth.   promethazine -dextromethorphan (PROMETHAZINE -DM) 6.25-15 MG/5ML syrup Take 5 mLs by mouth 4 (four) times daily as needed.   rosuvastatin  (CRESTOR ) 20 MG tablet Take 1 tablet (20 mg total) by mouth daily.   amitriptyline  (ELAVIL ) 25 MG tablet Take 1 tablet (25 mg total) by mouth at bedtime. (Patient not taking: Reported on 09/28/2023)   Fluticasone -Umeclidin-Vilant (TRELEGY ELLIPTA ) 100-62.5-25 MCG/ACT AEPB Take 1 Inhalation by mouth daily. (Patient not taking: Reported on 10/26/2023)    losartan  (COZAAR ) 50 MG tablet Take 1 tablet (50 mg total) by mouth daily.   MAGNESIUM -OXIDE 400 (240 Mg) MG tablet Take 1 tablet (400 mg total) by mouth daily. (Patient not taking: Reported on 09/28/2023)   No facility-administered encounter medications on file as of 10/26/2023.    Social History   Tobacco Use   Smoking status: Every Day    Current packs/day: 0.50    Average packs/day: 1 pack/day for 88.7 years (84.3 ttl pk-yrs)    Types: Cigarettes    Start date: 65   Smokeless tobacco: Never  Vaping Use   Vaping status: Never Used  Substance Use Topics   Alcohol use: Not Currently    Comment: Recovering alcoholic   Drug use: No    {History (Optional):23778}  Review of Systems  Constitutional:  Negative for diaphoresis, fever, malaise/fatigue and weight loss.  Respiratory:  Negative for cough, shortness of breath and wheezing.   Cardiovascular:  Negative for chest pain, palpitations, orthopnea, claudication, leg swelling and PND.      Objective:     BP (!) 171/89   Pulse 72   Temp 97.7 F (36.5 C) (Oral)   Resp 16   Wt 166 lb (75.3 kg)   SpO2 94%   BMI 29.41 kg/m  {Vitals History (Optional):23777}  Physical Exam Constitutional:      General: She is not in acute distress.    Appearance: She is ill-appearing. She is not toxic-appearing.  HENT:     Right Ear: Tympanic membrane normal.     Left Ear: Tympanic membrane normal.     Nose: Congestion and rhinorrhea present. Rhinorrhea is purulent.     Right Sinus: Maxillary sinus tenderness present.     Left Sinus: Maxillary sinus tenderness present.     Mouth/Throat:     Pharynx: Oropharyngeal exudate, posterior oropharyngeal erythema, uvula swelling and postnasal drip present.     Tonsils: No tonsillar abscesses.  Eyes:     Conjunctiva/sclera: Conjunctivae normal.  Cardiovascular:     Rate and Rhythm: Normal rate and regular rhythm.     Heart sounds: Normal heart sounds.  Pulmonary:     Effort: Pulmonary  effort is normal.     Breath sounds: Normal breath sounds.  Abdominal:     Palpations: Abdomen is soft.     Tenderness: There is no abdominal tenderness.  Musculoskeletal:     Cervical back: Normal range of motion and neck supple. No rigidity.     Right lower leg: No edema.     Left lower leg: No edema.     Comments: Left foot with mod. Tenderness across her 2nd thru 5th toes.  Tenderness noted also along distal Metatarsals.  Lymphadenopathy:     Cervical: Cervical adenopathy present.  Neurological:     Mental Status: She is alert.  No results found for any visits on 10/26/23.  {Labs (Optional):23779}  The ASCVD Risk score (Arnett DK, et al., 2019) failed to calculate for the following reasons:   Risk score cannot be calculated because patient has a medical history suggesting prior/existing ASCVD    Assessment & Plan:  Sinobronchitis Assessment & Plan: Symptomatic rx, but avoid decongestants.  Alert us  if not better in a few days.  Orders: -     Cefdinir ; Take 1 capsule (300 mg total) by mouth 2 (two) times daily.  Dispense: 14 capsule; Refill: 0  Foot pain, left -     DG Foot Complete Left  Essential hypertension Assessment & Plan: A bit labile due to her illness, discomfort, etc.  Monitor per [facility] protocol.     No follow-ups on file.    REDDING PONCE NORLEEN FALCON., MD

## 2023-10-26 NOTE — Assessment & Plan Note (Signed)
 Symptomatic rx, but avoid decongestants.  Alert us  if not better in a few days.

## 2023-10-26 NOTE — Assessment & Plan Note (Signed)
 A bit labile due to her illness, discomfort, etc.  Monitor per [facility] protocol.

## 2023-10-27 ENCOUNTER — Encounter (HOSPITAL_BASED_OUTPATIENT_CLINIC_OR_DEPARTMENT_OTHER): Payer: Self-pay | Admitting: Family Medicine

## 2023-10-27 NOTE — Assessment & Plan Note (Signed)
 Subtle phalangeal fracture.  Buddy taping.  Comfortable shoes.  F/u prn.

## 2023-10-28 ENCOUNTER — Telehealth (HOSPITAL_BASED_OUTPATIENT_CLINIC_OR_DEPARTMENT_OTHER): Payer: Self-pay | Admitting: Family Medicine

## 2023-10-28 NOTE — Telephone Encounter (Signed)
 Copied from CRM #[id number]. Topic: Clinical - Lab/Test Results >> Oct 28, 2023 11:40 AM Donna BRAVO wrote: Reason for CRM: patient calling regarding x-ray that was done on 10/26/23  Hurts to put on shoes   Patient was informed their call will be returned before end of business day.

## 2023-10-28 NOTE — Progress Notes (Signed)
 Remote Loop Recorder Transmission

## 2023-11-02 ENCOUNTER — Encounter (HOSPITAL_BASED_OUTPATIENT_CLINIC_OR_DEPARTMENT_OTHER): Payer: Self-pay | Admitting: *Deleted

## 2023-11-04 NOTE — Progress Notes (Signed)
 Carelink Summary Report / Loop Recorder

## 2023-11-11 ENCOUNTER — Telehealth (HOSPITAL_BASED_OUTPATIENT_CLINIC_OR_DEPARTMENT_OTHER): Payer: Self-pay | Admitting: *Deleted

## 2023-11-11 NOTE — Telephone Encounter (Signed)
 Copied from CRM #[id number]. Topic: Medical Record Request - Other >> Oct 05, 2023  9:44 AM Tinnie BROCKS wrote: Reason for CRM: Patient calling back in regards to previous CRM. The fax number for food and nutrition services is [phone].

## 2023-11-11 NOTE — Telephone Encounter (Signed)
 Copied from CRM #[id number]. Topic: Medical Record Request - Other >> Oct 05, 2023  9:07 AM Mary Franco wrote: Reason for CRM: Patient called in stating she needs a note stating she cannot work along with the reasons for Social services. Stated she will give a call back to provide fax number.

## 2023-11-15 ENCOUNTER — Ambulatory Visit (INDEPENDENT_AMBULATORY_CARE_PROVIDER_SITE_OTHER)

## 2023-11-15 DIAGNOSIS — I639 Cerebral infarction, unspecified: Secondary | ICD-10-CM

## 2023-11-15 LAB — CUP PACEART REMOTE DEVICE CHECK
Date Time Interrogation Session: 20250927232747
Implantable Pulse Generator Implant Date: 20240610

## 2023-11-16 ENCOUNTER — Ambulatory Visit: Payer: Self-pay | Admitting: Cardiology

## 2023-11-17 NOTE — Progress Notes (Signed)
 Remote Loop Recorder Transmission

## 2023-11-18 NOTE — Progress Notes (Signed)
 Remote Loop Recorder Transmission

## 2023-11-24 ENCOUNTER — Ambulatory Visit (HOSPITAL_BASED_OUTPATIENT_CLINIC_OR_DEPARTMENT_OTHER): Payer: Self-pay | Admitting: Family Medicine

## 2023-11-24 DIAGNOSIS — R0609 Other forms of dyspnea: Secondary | ICD-10-CM

## 2023-11-24 DIAGNOSIS — J449 Chronic obstructive pulmonary disease, unspecified: Secondary | ICD-10-CM

## 2023-11-24 NOTE — Telephone Encounter (Signed)
 Reason for Disposition . [1] Prescription refill request for ESSENTIAL medicine (i.e., likelihood of harm to patient if not taken) AND [2] triager unable to refill per department policy  Answer Assessment - Initial Assessment Questions 1. DRUG NAME: What medicine do you need to have refilled?     Pro air, albuterol  neb, Trelegy   She reports Trelegy is no longer covered by insurance per [employer]. Does she need a prior auth?  Using her albuterol  nebulizer bid in place of Trelegy, requesting refill of albuterol  nebulizer solution and albuterol  inhaler.   Interested in Chantix  prescription  to quit smoking cigarettes  Protocols used: Medication Refill and Renewal Call-A-AH

## 2023-11-24 NOTE — Telephone Encounter (Signed)
     Copied from CRM [id number]. Topic: Clinical - Medication Question >> Nov 24, 2023  3:00 PM Mary Franco wrote: Reason for CRM: patient wants to know if her provider will call her in a inhaler. (ProAir ) inhaler.

## 2023-11-25 MED ORDER — ALBUTEROL SULFATE HFA 108 (90 BASE) MCG/ACT IN AERS: 2.0000 | INHALATION_SPRAY | RESPIRATORY_TRACT | 3 refills | Status: AC | PRN

## 2023-11-25 MED ORDER — ALBUTEROL SULFATE (2.5 MG/3ML) 0.083% IN NEBU
2.5000 mg | INHALATION_SOLUTION | RESPIRATORY_TRACT | 4 refills | Status: AC | PRN
Start: 2023-11-25 — End: ?

## 2023-11-25 MED ORDER — TRELEGY ELLIPTA 100-62.5-25 MCG/ACT IN AEPB: 1.0000 | INHALATION_SPRAY | Freq: Every day | RESPIRATORY_TRACT | 4 refills | Status: DC

## 2023-12-03 ENCOUNTER — Other Ambulatory Visit: Payer: Self-pay | Admitting: Internal Medicine

## 2023-12-06 ENCOUNTER — Other Ambulatory Visit (HOSPITAL_BASED_OUTPATIENT_CLINIC_OR_DEPARTMENT_OTHER): Payer: Self-pay | Admitting: *Deleted

## 2023-12-16 ENCOUNTER — Ambulatory Visit

## 2023-12-16 DIAGNOSIS — I639 Cerebral infarction, unspecified: Secondary | ICD-10-CM

## 2023-12-16 LAB — CUP PACEART REMOTE DEVICE CHECK
Date Time Interrogation Session: 20251029233201
Implantable Pulse Generator Implant Date: 20240610

## 2023-12-17 ENCOUNTER — Ambulatory Visit: Payer: Self-pay | Admitting: Cardiology

## 2023-12-21 NOTE — Progress Notes (Signed)
 Remote Loop Recorder Transmission

## 2023-12-31 ENCOUNTER — Telehealth (HOSPITAL_BASED_OUTPATIENT_CLINIC_OR_DEPARTMENT_OTHER): Payer: Self-pay

## 2023-12-31 ENCOUNTER — Encounter: Payer: Self-pay | Admitting: Cardiology

## 2023-12-31 NOTE — Telephone Encounter (Signed)
   Pre-operative Risk Assessment    Patient Name: Mary Franco  DOB: Jun 28, 1965 MRN: [id number]   Date of last office visit: Telephone PreOp visit 06/23/2023 with Lum Louis, NP and In-Person visit 12/30/2022 with Dr. Edwyna  Date of next office visit: None  Request for Surgical Clearance    Procedure:  Left knee scope, PMM  Date of Surgery:  Clearance TBD                                 Surgeon:  Dr. Selinda Gosling Surgeon's Group or Practice Name:  [facility] Phone number:  [phone] Fax number:  [phone] or [phone] - Kerri Maze   Type of Clearance Requested:   - Medical  - Pharmacy:  Hold Apixaban  (Eliquis ) - Does not specify   Type of Anesthesia:  Does not specify   Additional requests/questions:  PT HAS AN ILR  Signed, Patrcia Iverson CROME   12/31/2023, 2:28 PM

## 2023-12-31 NOTE — Progress Notes (Signed)
 PERIOPERATIVE PRESCRIPTION FOR IMPLANTED CARDIAC DEVICE PROGRAMMING   Patient Information:  Patient: Mary Franco  MRN: [id number]  Date of Birth: [birth year]   Procedure:  Left knee scope, PMM Date of Surgery:  Clearance TBD                              Surgeon:  Dr. Selinda Gosling Surgeon's Group or Practice Name:  [facility] Phone number:  [phone] Fax number:  [phone] or [phone] [place] Ester Maze   Device Information:   Clinic EP Physician:   Dr. Soyla Norton Device Type:  Loop Recorder Manufacturer and Phone #:  [employer]: [id number] Pacemaker Dependent?:  N/A Date of Last Device Check:  12/15/2023         Normal Device Function?:  Yes     Electrophysiologist's Recommendations:   Have magnet available. Provide continuous ECG monitoring when magnet is used or reprogramming is to be performed.  Procedure should not interfere with device function.  No device programming or magnet placement needed.  Per Device Clinic Standing Orders, Almarie ONEIDA Shutter  12/31/2023 2:59 PM

## 2024-01-14 ENCOUNTER — Other Ambulatory Visit: Payer: Self-pay | Admitting: Cardiology

## 2024-01-14 ENCOUNTER — Other Ambulatory Visit: Payer: Self-pay | Admitting: Physician Assistant

## 2024-01-14 DIAGNOSIS — Z79899 Other long term (current) drug therapy: Secondary | ICD-10-CM

## 2024-01-14 DIAGNOSIS — I5042 Chronic combined systolic (congestive) and diastolic (congestive) heart failure: Secondary | ICD-10-CM

## 2024-01-14 DIAGNOSIS — I10 Essential (primary) hypertension: Secondary | ICD-10-CM

## 2024-01-16 ENCOUNTER — Ambulatory Visit

## 2024-01-17 ENCOUNTER — Telehealth (HOSPITAL_BASED_OUTPATIENT_CLINIC_OR_DEPARTMENT_OTHER): Payer: Self-pay | Admitting: *Deleted

## 2024-01-17 ENCOUNTER — Ambulatory Visit

## 2024-01-17 ENCOUNTER — Telehealth (HOSPITAL_BASED_OUTPATIENT_CLINIC_OR_DEPARTMENT_OTHER): Payer: Self-pay | Admitting: Family Medicine

## 2024-01-17 ENCOUNTER — Encounter

## 2024-01-17 DIAGNOSIS — I639 Cerebral infarction, unspecified: Secondary | ICD-10-CM

## 2024-01-17 NOTE — Telephone Encounter (Signed)
 Copied from CRM #[id number]. Topic: General - Other >> Jan 17, 2024 10:32 AM Thersia BROCKS wrote: Reason for RMF:Ejupzwu calls in stated her knee doctor Jason P. Sharl, MD  wants to do surgery on left leg , stated she has a vitamin deficiency , [facility] sees Dr.Sadiq , until she can get her vitamin right she is unable to do surgery, wanted to know if Dr.Redding could call Dr.Sadiq and followup regarding this wants to know if she can get these labs done at this office instead of going there would like a callback regarding this   Stated her number is  [facility] [address] [place], [place] [address] Directions [phone]   Selinda Sharl, MD Address: [address], [place], [place] [address] [phone]

## 2024-01-18 LAB — CUP PACEART REMOTE DEVICE CHECK
Date Time Interrogation Session: 20251130232615
Implantable Pulse Generator Implant Date: 20240610

## 2024-01-18 NOTE — Telephone Encounter (Signed)
Pt. Was called

## 2024-01-20 ENCOUNTER — Ambulatory Visit: Payer: Self-pay | Admitting: Cardiology

## 2024-01-21 NOTE — Progress Notes (Signed)
 Remote Loop Recorder Transmission

## 2024-01-31 ENCOUNTER — Ambulatory Visit: Admitting: Adult Health

## 2024-01-31 ENCOUNTER — Telehealth: Payer: Self-pay | Admitting: Adult Health

## 2024-01-31 NOTE — Telephone Encounter (Signed)
 Pt has r/s her appointment due to a conflict

## 2024-01-31 NOTE — Progress Notes (Deleted)
 [facility] [address] [place]. [place] [address] [phone]       OFFICE FOLLOW-UP VISIT NOTE  [name] Date of Birth:  06/28/1965 Medical Record Number:  [id number]   Primary neurologist: Dr. Rosemarie Reason for visit: stroke follow up   No chief complaint on file.     HPI:   Update 01/31/2024 JM: Patient returns for follow-up visit.  Overall stable from stroke standpoint without new stroke/TIA symptoms.  At prior visit, Mary Franco was started on amitriptyline  for complaints of daily headaches, suspected component of rebound headache taking OTC pain relievers frequently over the past several months.          History provided for reference purposes only Update 07/06/2023 JM: Patient returns for follow-up visit after prior visit with Dr. Rosemarie 7 months ago.  Continues to do well from stroke standpoint without new or reoccurring stroke/TIA symptoms. Does report increased headaches over the past several months, bitemporal with dull pressure sensation, present daily, can be present when waking up or occur mid day. Mary Franco has been taking tylenol  daily over the past several months and more recently taking Excedrin as Mary Franco is out of Tylenol .  Denies photophobia, phonophobia or N/V.  No visual changes.  Mary Franco does endorse increased stress especially as Mary Franco has not worked over the past year and has persistent knee pain which Mary Franco feels could be contributing to headaches.  Mary Franco is currently on mag oxide for magnesium  deficiency.  Mary Franco also mentions mild short-term memory difficulties such as remembering appointments which has been present since her stroke. Mary Franco was found to have new onset A-fib per ILR on 12/11/2022 and Eliquis  initiated.  Remains on Eliquis  and Crestor  without side effects.  Routinely follows with PCP and cardiology.  No further questions or concerns at this time.  Update 11/19/2022 Dr. Rosemarie: Mary Franco returns for follow-up after last visit 5 months ago.  Mary Franco is  doing well from stroke standpoint without recurrent stroke or TIA symptoms.  Mary Franco is most bothered today by her left knee pain.  Mary Franco had left ACL tear and had scheduled surgery earlier but her insurance has changed and now needs to reschedule.  Mary Franco had cut back smoking cigarettes to 2 cigarettes/day but now because of the stress Mary Franco is gone up smoking again but is not smoking as much as Mary Franco used to.  Mary Franco had a loop recorder inserted and so far paroxysmal A-fib has not yet been found.  Lab work on 08/04/2022 showed hemoglobin A1c to be 6.2 and LDL cholesterol to be 71 mg percent.  Mary Franco was seen by interventional cardiologist Dr. Robleto on 11/02/2022 patient has not yet decided whether Mary Franco wants PFO closure.  Explained to her that her risk is at the most medium but Mary Franco also needs to focus on controlling her modifiable risk factors  like smoking cessation and being compliant with her medications controlling blood pressure sugar and cholesterol and losing weight and eating healthy.  Initial visit 06/16/2022 Dr. Rosemarie: Mary Franco is a 58 year old Caucasian lady seen today for initial office consultation visit for stroke.  History is obtained from the patient and review of electronic medical records I personally reviewed the available pertinent imaging films in PACS.  Mary Franco has past medical history of DM, alcohol and amphetamine abuse, hyperlipidemia, hypertension, nonischemic cardiomyopathy, fibromyalgia and gastroesophageal reflux disease.  Patient woke up on 02/08/2022 and noticed heaviness weakness and numbness in the right leg.  Much of it was as the day went on  Mary Franco noticed worsening of her speech as well and increased leg weakness.  Mary Franco was not able to continue at work and went home.  Family convinced her to go to the ER that night and use went to [facility] where Mary Franco was admitted for 4 days.  MRI scan of the brain small embolic infarct involving left insular and parietal lobes.  CT angiogram of brain and neck  showed 50% bilateral carotid bifurcation stenosis with no large vessel occlusion.  2D echo showed normal ejection fraction.  Dilated with an outpatient on 02/25/2022 showed ejection fraction 55 to 60%.  No clots.  There was evidence of a small right to left.  LDL cholesterol was 77 mg percent.  Patient did wear a 30-day heart monitor which was done in January but I did not find the results patient is unaware of the results-patient has since been taking aspirin  and Plavix  and tolerating well with only minor bruising and no bleeding.  Mary Franco said right leg weakness has recovered completely.  Mary Franco is more bothered by left knee which is chronic due to her torn ACL and the knee brace.  Mary Franco plans to have left knee surgery soon but prior to that Mary Franco needs dental extraction he is here today asking for neurological clearance for both procedures.  Mary Franco denies any prior history of strokes TIAs or significant neurological problems..There is family history of stroke in her grandfather.  Patient smokes and knows that Mary Franco needs to quit.  Will refer to cardiologist Dr Wendel who wants an opinion about possible endovascular PFO closure.  Mary Franco denies any prior Palpitations, atrial fibrillation, syncope or significant coronary artery disease     ROS:   14 system review of systems is positive for those listed in HPI and all other systems negative  PMH:  Past Medical History:  Diagnosis Date   Anxiety    Aortic atherosclerosis 08/04/2022   ASD (atrial septal defect) 07/19/2018   Asthma    Chronic allergic rhinitis 08/04/2022   Congestive heart failure (HCC) 02/15/2018   COPD (chronic obstructive pulmonary disease) (HCC)    f/by Pulmonology in [facility]   Essential hypertension    GERD (gastroesophageal reflux disease)    Hyperlipidemia    Irritable bowel syndrome 06/11/2022   Major depressive disorder 09/15/2011   Migraines    f/by Neurology   NICM (nonischemic cardiomyopathy) (HCC) 03/25/2018   Ovarian failure     Sees GYN in [place]   Panic disorder without agoraphobia 09/15/2011   Paroxysmal atrial fibrillation (HCC) 12/16/2022   f/by Dr. Edwyna   Pernicious anemia    PFO (patent foramen ovale) 08/04/2022   Thromboembolic stroke (HCC) 02/11/2022   f/by Neurology   Tobacco abuse    Unable to quit   Vitamin D  deficiency     Social History:  Social History   Socioeconomic History   Marital status: Divorced    Spouse name: Not on file   Number of children: 2   Years of education: Not on file   Highest education level: Not on file  Occupational History   Occupation: Designer, Fashion/clothing   Occupation: Unemployed  Tobacco Use   Smoking status: Every Day    Current packs/day: 0.50    Average packs/day: 0.9 packs/day for 89.0 years (84.5 ttl pk-yrs)    Types: Cigarettes    Start date: 1977   Smokeless tobacco: Never  Vaping Use   Vaping status: Never Used  Substance and Sexual Activity   Alcohol use: Not Currently  Comment: Recovering alcoholic   Drug use: No   Sexual activity: Not Currently  Other Topics Concern   Not on file  Social History Narrative   ** Merged History Encounter **   Patient is right handed.   Patient drinks 3 cups caffeine daily.   Working on 2nd shift now at her job.  08-15-14          Social Drivers of Health   Tobacco Use: High Risk (12/22/2023)   Received from [facility]   Patient History    Smoking Tobacco Use: Every Day    Smokeless Tobacco Use: Never    Passive Exposure: Not on file  Financial Resource Strain: Low Risk (09/21/2022)   Overall Financial Resource Strain (CARDIA)    Difficulty of Paying Living Expenses: Not hard at all  Food Insecurity: No Food Insecurity (09/21/2022)   Hunger Vital Sign    Worried About Running Out of Food in the Last Year: Never true    Ran Out of Food in the Last Year: Never true  Transportation Needs: No Transportation Needs (09/21/2022)   PRAPARE - Administrator, Civil Service (Medical): No    Lack of  Transportation (Non-Medical): No  Physical Activity: Inactive (09/21/2022)   Exercise Vital Sign    Days of Exercise per Week: 0 days    Minutes of Exercise per Session: 0 min  Stress: Stress Concern Present (09/21/2022)   [employer] of Occupational Health - Occupational Stress Questionnaire    Feeling of Stress : To some extent  Social Connections: Socially Isolated (09/21/2022)   Social Connection and Isolation Panel    Frequency of Communication with Friends and Family: More than three times a week    Frequency of Social Gatherings with Friends and Family: More than three times a week    Attends Religious Services: Never    Database Administrator or Organizations: No    Attends Banker Meetings: Never    Marital Status: Divorced  Catering Manager Violence: Not At Risk (09/21/2022)   Humiliation, Afraid, Rape, and Kick questionnaire    Fear of Current or Ex-Partner: No    Emotionally Abused: No    Physically Abused: No    Sexually Abused: No  Depression (PHQ2-9): Medium Risk (06/15/2023)   Depression (PHQ2-9)    PHQ-2 Score: 10  Alcohol Screen: Low Risk (09/21/2022)   Alcohol Screen    Last Alcohol Screening Score (AUDIT): 0  Housing: Low Risk (09/21/2022)   Housing    Last Housing Risk Score: 0  Utilities: Not At Risk (09/21/2022)   AHC Utilities    Threatened with loss of utilities: No  Health Literacy: Adequate Health Literacy (09/21/2022)   B1300 Health Literacy    Frequency of need for help with medical instructions: Never    Medications:   Current Outpatient Medications on File Prior to Visit  Medication Sig Dispense Refill   acetaminophen  (TYLENOL ) 500 MG tablet Take 1,000 mg by mouth every 6 (six) hours as needed for moderate pain.     albuterol  (PROVENTIL ) (2.5 MG/3ML) 0.083% nebulizer solution Take 3 mLs (2.5 mg total) by nebulization every 4 (four) hours as needed for wheezing or shortness of breath (Use instead of the MDI if needed.). 75 mL 4   albuterol   (VENTOLIN  HFA) 108 (90 Base) MCG/ACT inhaler Inhale 2 puffs into the lungs every 4 (four) hours as needed for wheezing or shortness of breath. 9 g 3   alprazolam  (XANAX ) 2 MG tablet  Take 2 mg by mouth at bedtime.     amitriptyline  (ELAVIL ) 25 MG tablet Take 1 tablet (25 mg total) by mouth at bedtime. (Patient not taking: Reported on 09/28/2023) 30 tablet 5   apixaban  (ELIQUIS ) 5 MG TABS tablet Take 1 tablet (5 mg total) by mouth 2 (two) times daily. 60 tablet 3   B Complex Vitamins (VITAMIN B COMPLEX PO) Take by mouth.     bisoprolol  (ZEBETA ) 10 MG tablet Take 2 tablets (20 mg total) by mouth daily. 60 tablet 1   budesonide -formoterol  (SYMBICORT ) 160-4.5 MCG/ACT inhaler Inhale into the lungs.     cefdinir  (OMNICEF ) 300 MG capsule Take 1 capsule (300 mg total) by mouth 2 (two) times daily. 14 capsule 0   cetirizine  (ZYRTEC  ALLERGY) 10 MG tablet Take 1 tablet (10 mg total) by mouth daily. 30 tablet 3   cholecalciferol (VITAMIN D3) 25 MCG (1000 UNIT) tablet Take 1,000 Units by mouth daily.     cyanocobalamin (VITAMIN B12) 1000 MCG tablet Take 1,000 mcg by mouth.     esomeprazole  (NEXIUM ) 40 MG capsule Take 40 mg by mouth daily at 12 noon.     fluconazole  (DIFLUCAN ) 150 MG tablet Take 1 tablet (150 mg total) by mouth daily. 1 tablet 0   Fluticasone -Umeclidin-Vilant (TRELEGY ELLIPTA ) 100-62.5-25 MCG/ACT AEPB Take 1 Inhalation by mouth daily. 1 each 4   gabapentin  (NEURONTIN ) 300 MG capsule Take 1 capsule (300 mg total) by mouth at bedtime. 90 capsule 1   ketoconazole (NIZORAL) 2 % shampoo SHAMPOO TOPICALLY ONCE DAILY AS NEEDED     losartan  (COZAAR ) 50 MG tablet Take 1 tablet by mouth once daily 30 tablet 0   magnesium  chloride (SLOW-MAG) 64 MG TBEC SR tablet Take 143 mg by mouth.     MAGNESIUM -OXIDE 400 (240 Mg) MG tablet Take 1 tablet (400 mg total) by mouth daily. (Patient not taking: Reported on 09/28/2023) 30 tablet 3   ondansetron  (ZOFRAN ) 4 MG tablet Take 1 tablet (4 mg total) by mouth every 8  (eight) hours as needed for nausea or vomiting. 30 tablet 1   potassium chloride  SA (KLOR-CON  M) 20 MEQ tablet Take 1 tablet (20 mEq total) by mouth daily. 90 tablet 1   predniSONE  (STERAPRED UNI-PAK 21 TAB) 5 MG (21) TBPK tablet Take by mouth.     promethazine -dextromethorphan (PROMETHAZINE -DM) 6.25-15 MG/5ML syrup Take 5 mLs by mouth 4 (four) times daily as needed. 118 mL 0   rosuvastatin  (CRESTOR ) 20 MG tablet Take 1 tablet (20 mg total) by mouth daily. 90 tablet 0   rosuvastatin  (CRESTOR ) 40 MG tablet Take 40 mg by mouth daily.     No current facility-administered medications on file prior to visit.    Allergies:   Allergies  Allergen Reactions   Codeine Itching   Sulfa Antibiotics Itching   Pholcodine Hives and Itching   Spironolactone      Electrolyte imbalance   Zetia  [Ezetimibe ] Swelling      Physical Exam There were no vitals filed for this visit.  There is no height or weight on file to calculate BMI.  General: well developed, well nourished pleasant middle-age Caucasian lady, seated, in no evident distress Head: head normocephalic and atraumatic.   Neck: supple with no carotid or supraclavicular bruits Cardiovascular: regular rate and rhythm, no murmurs Musculoskeletal: no deformity Skin:  no rash/petichiae Vascular:  Normal pulses all extremities  Neurologic Exam Mental Status: Awake and fully alert.  Fluent speech and language.  Oriented to place and time. Recent memory  mildly impaired and remote memory intact. Attention span, concentration and fund of knowledge appropriate. Mood and affect appropriate.  Cranial Nerves: Pupils equal, briskly reactive to light. Extraocular movements full without nystagmus. Visual fields full to confrontation. Hearing intact. Facial sensation intact. Face, tongue, palate moves normally and symmetrically.  Motor: Normal bulk and tone. Normal strength in all tested extremity muscles. Sensory.: intact to touch , pinprick , position and  vibratory sensation.  Coordination: Rapid alternating movements normal in all extremities. Finger-to-nose and heel-to-shin performed accurately bilaterally. Gait and Station: Arises from chair without difficulty. Stance is normal. Gait demonstrates antalgic gait with mild favoring of LLE due to knee pain Reflexes: 1+ and symmetric. Toes downgoing.        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  Vascular risk factors of atrial fibrillation, mild hyperlipidemia, extracranial atherosclerosis, ongoing tobacco abuse and remote substance abuse and small PFO.  Complains of tension type headaches over the past 3 to 4 months, suspect component of rebound headache with daily OTC use.      PLAN:  - start amitriptyline  25mg  nightly for tension type headaches, also discussed limiting tylenol  use as suspect component of analgesic rebound headache.  No red flag symptoms and neuroexam intact, do not feel imaging warranted at this time. - Continue Eliquis  5 mg twice daily and Crestor  20 mg daily managed/prescribed by PCP/cardiology - Continue routine cardiology follow-up for atrial fibrillation and Eliquis  management - Continue close PCP follow-up for aggressive stroke risk factor management - Discussed importance of complete tobacco cessation and risks of continued use and to follow-up with PCP if assistance is needed    Follow-up in 7 months or call earlier if needed       Harlene Bogaert, [facility]  [facility] [address] [place], [place] [address]  Phone [phone] Fax [phone] Note: This document was prepared with digital dictation and possible smart phrase technology. Any transcriptional errors that result from this process are unintentional.

## 2024-02-02 ENCOUNTER — Other Ambulatory Visit (HOSPITAL_BASED_OUTPATIENT_CLINIC_OR_DEPARTMENT_OTHER)

## 2024-02-02 DIAGNOSIS — E878 Other disorders of electrolyte and fluid balance, not elsewhere classified: Secondary | ICD-10-CM | POA: Insufficient documentation

## 2024-02-02 HISTORY — DX: Other disorders of electrolyte and fluid balance, not elsewhere classified: E87.8

## 2024-02-02 NOTE — ED Provider Notes (Signed)
 ED OBSERVATION: COPD/Dehydration PROTOCOL  Seen at bedside with Dr. waddell  HPI:  Mary Franco is a 58 y.o. female past medical history of COPD, current daily smoker, electrolyte deficiency following with nephrologist Dr.Sidiq who presented to the ED for evaluation of generalized weakness fatigue and diffuse numbness and tingling.  Patient is also complaining of coughing and wheezing and she frequently gets upper respiratory sinus infections and bronchitis.  She just completed a course of doxycycline  outpatient for COPD exacerbation.  PMH, PSH, SH, FH, allergies and medical records reviewed.   OBJECTIVE:  Vitals:   02/02/24 1530  BP: 137/69  Pulse: 88  Resp: 17  Temp: 98.1 F (36.7 C)  SpO2: 97%   Physical Exam Constitutional:      General: She is not in acute distress.    Appearance: Normal appearance. She is not toxic-appearing.  HENT:     Head: Normocephalic and atraumatic.     Nose: Nose normal.  Eyes:     Extraocular Movements: Extraocular movements intact.  Cardiovascular:     Rate and Rhythm: Normal rate.  Pulmonary:     Effort: Pulmonary effort is normal. No respiratory distress.     Breath sounds: Wheezing present.  Genitourinary:    Comments: Rectal exam performed with RN as chaperone.  Unfortunately no stool in the rectal vault she did have a small external hemorrhoid that is nonthrombosed. Musculoskeletal:     Cervical back: Normal range of motion.  Neurological:     General: No focal deficit present.     Mental Status: She is alert and oriented to person, place, and time.  Psychiatric:        Mood and Affect: Mood normal.        Behavior: Behavior normal.      Results for orders placed or performed during the hospital encounter of 02/01/24  Comprehensive Metabolic Panel   Collection Time: 02/01/24  6:05 PM  Result Value Ref Range   Sodium 139 136 - 145 mmol/L   Potassium 2.8 (LL) 3.4 - 4.5 mmol/L   Chloride 101 98 - 107 mmol/L   CO2  27 21 - 31 mmol/L   Anion Gap 11 6 - 14 mmol/L   Glucose, Random 138 (H) 70 - 99 mg/dL   Blood Urea  Nitrogen (BUN) 9 7 - 25 mg/dL   Creatinine 9.17 9.39 - 1.20 mg/dL   eGFR 82 >40 fO/fpw/8.26f7   Albumin 3.7 3.5 - 5.7 g/dL   Total Protein 6.5 6.4 - 8.9 g/dL   Bilirubin, Total 0.3 0.3 - 1.0 mg/dL   Alkaline Phosphatase (ALP) 89 34 - 104 U/L   Aspartate Aminotransferase (AST) 12 (L) 13 - 39 U/L   Alanine Aminotransferase (ALT) 12 7 - 52 U/L   Calcium  6.5 (L) 8.6 - 10.3 mg/dL   BUN/Creatinine Ratio    Magnesium    Collection Time: 02/01/24  6:05 PM  Result Value Ref Range   Magnesium  0.5 (LL) 1.9 - 2.7 mg/dL  CBC with Differential   Collection Time: 02/01/24  6:05 PM  Result Value Ref Range   WBC 7.14 4.40 - 11.00 10*3/uL   RBC 4.23 4.10 - 5.10 10*6/uL   Hemoglobin 10.0 (L) 12.3 - 15.3 g/dL   Hematocrit 68.7 (L) 64.0 - 44.6 %   Mean Corpuscular Volume (MCV) 73.7 (L) 80.0 - 96.0 fL   Mean Corpuscular Hemoglobin (MCH) 23.6 (L) 27.5 - 33.2 pg   Mean Corpuscular Hemoglobin Conc (MCHC) 32.0 (L) 33.0 - 37.0 g/dL   Red  Cell Distribution Width (RDW) 18.2 (H) 12.3 - 17.0 %   Platelet Count (PLT) 535 (H) 150 - 450 10*3/uL   Mean Platelet Volume (MPV) 6.8 6.8 - 10.2 fL   Neutrophils % 67 %   Lymphocytes % 22 %   Monocytes % 10 %   Eosinophils % 0 %   Basophils % 1 %   Neutrophils Absolute 4.80 1.80 - 7.80 10*3/uL   Lymphocytes # 1.60 1.00 - 4.80 10*3/uL   Monocytes # 0.70 0.00 - 0.80 10*3/uL   Eosinophils # 0.00 0.00 - 0.50 10*3/uL   Basophils # 0.00 0.00 - 0.20 10*3/uL  SARS-Cov-2, Flu, and RSV, Qualitative NAAT   Collection Time: 02/02/24  6:02 AM  Result Value Ref Range   SARS-CoV-2 Negative Negative   Influenza A Negative Negative   Influenza B Negative Negative   RSV Negative Negative  Basic Metabolic Panel   Collection Time: 02/02/24  9:46 AM  Result Value Ref Range   Sodium 139 136 - 145 mmol/L   Potassium 3.4 3.4 - 4.5 mmol/L   Chloride 107 98 - 107 mmol/L   CO2 25 21 -  31 mmol/L   Anion Gap 7 6 - 14 mmol/L   Glucose, Random 104 (H) 70 - 99 mg/dL   Blood Urea  Nitrogen (BUN) 7 7 - 25 mg/dL   Creatinine 9.24 9.39 - 1.20 mg/dL   eGFR >09 >40 fO/fpw/8.26f7   Calcium  6.4 (L) 8.6 - 10.3 mg/dL   BUN/Creatinine Ratio    Magnesium    Collection Time: 02/02/24  9:46 AM  Result Value Ref Range   Magnesium  1.5 (L) 1.9 - 2.7 mg/dL  CBC with Differential   Collection Time: 02/02/24  9:46 AM  Result Value Ref Range   WBC 5.24 4.40 - 11.00 10*3/uL   RBC 3.73 (L) 4.10 - 5.10 10*6/uL   Hemoglobin 9.0 (L) 12.3 - 15.3 g/dL   Hematocrit 72.2 (L) 64.0 - 44.6 %   Mean Corpuscular Volume (MCV) 74.3 (L) 80.0 - 96.0 fL   Mean Corpuscular Hemoglobin (MCH) 24.1 (L) 27.5 - 33.2 pg   Mean Corpuscular Hemoglobin Conc (MCHC) 32.5 (L) 33.0 - 37.0 g/dL   Red Cell Distribution Width (RDW) 18.4 (H) 12.3 - 17.0 %   Platelet Count (PLT) 435 150 - 450 10*3/uL   Mean Platelet Volume (MPV) 7.1 6.8 - 10.2 fL   Neutrophils % 54 %   Lymphocytes % 33 %   Monocytes % 12 %   Eosinophils % 1 %   Basophils % 1 %   Neutrophils Absolute 2.80 1.80 - 7.80 10*3/uL   Lymphocytes # 1.70 1.00 - 4.80 10*3/uL   Monocytes # 0.60 0.00 - 0.80 10*3/uL   Eosinophils # 0.00 0.00 - 0.50 10*3/uL   Basophils # 0.00 0.00 - 0.20 10*3/uL  Basic Metabolic Panel   Collection Time: 02/02/24  2:05 PM  Result Value Ref Range   Sodium 138 136 - 145 mmol/L   Potassium 3.2 (L) 3.4 - 4.5 mmol/L   Chloride 106 98 - 107 mmol/L   CO2 27 21 - 31 mmol/L   Anion Gap 5 (L) 6 - 14 mmol/L   Glucose, Random 105 (H) 70 - 99 mg/dL   Blood Urea  Nitrogen (BUN) 7 7 - 25 mg/dL   Creatinine 9.24 9.39 - 1.20 mg/dL   eGFR >09 >40 fO/fpw/8.26f7   Calcium  6.6 (L) 8.6 - 10.3 mg/dL   BUN/Creatinine Ratio    Magnesium    Collection Time: 02/02/24  2:05 PM  Result Value Ref Range   Magnesium  2.1 1.9 - 2.7 mg/dL  CBC with Differential   Collection Time: 02/02/24  2:05 PM  Result Value Ref Range   WBC 4.59 4.40 - 11.00 10*3/uL   RBC  3.58 (L) 4.10 - 5.10 10*6/uL   Hemoglobin 8.6 (L) 12.3 - 15.3 g/dL   Hematocrit 73.1 (L) 64.0 - 44.6 %   Mean Corpuscular Volume (MCV) 74.9 (L) 80.0 - 96.0 fL   Mean Corpuscular Hemoglobin (MCH) 24.0 (L) 27.5 - 33.2 pg   Mean Corpuscular Hemoglobin Conc (MCHC) 32.0 (L) 33.0 - 37.0 g/dL   Red Cell Distribution Width (RDW) 18.7 (H) 12.3 - 17.0 %   Platelet Count (PLT) 419 150 - 450 10*3/uL   Mean Platelet Volume (MPV) 6.9 6.8 - 10.2 fL   Neutrophils % 53 %   Lymphocytes % 31 %   Monocytes % 15 %   Eosinophils % 1 %   Basophils % 1 %   Neutrophils Absolute 2.40 1.80 - 7.80 10*3/uL   Lymphocytes # 1.40 1.00 - 4.80 10*3/uL   Monocytes # 0.70 0.00 - 0.80 10*3/uL   Eosinophils # 0.00 0.00 - 0.50 10*3/uL   Basophils # 0.00 0.00 - 0.20 10*3/uL  Anemia Profile   Collection Time: 02/02/24  2:05 PM  Result Value Ref Range   Iron 15 (L) 50 - 212 ug/dL   Transferrin 723 796 - 362 mg/dL   Ferritin 20 11 - 692 ng/mL   Total Iron Binding Capacity (TIBC) 395 290 - 518 ug/dL   Transferrin Saturation 4 (L) 15 - 45 %     No orders to display      MDM/PLAN: Patient was transferred to CDU for electrolyte replacement which fortunately is improving.  However she has not had any fluids overnight and her hemoglobin has continued to downtrend.  She had some fluid obviously with the magnesium  and potassium but she is on a blood thinner.  Attempted to perform a rectal exam but unfortunately no stool in the vault.  Patient will try to get a stool sample when able. She continues to have wheezing on exam despite recent treatment outpatient.  Will initiate neb and Solu-Medrol treatment Discussed with hospitalist admit. Patient verbalized understanding and agreement with plan.   Ddx:  CLINICAL IMPRESSION: 1. Hypokalemia   2. Hypomagnesemia   3. Anemia, unspecified type   4. COPD exacerbation    (CMD)      The patient is currently in observation status in order to determine necessity of potential  hospital admission. The patient presentation is consistent with acute presentation with potential threat to life or bodily function.   Provider time spent in patient care today, inclusive of but not limited to clinical reassessment, review of diagnostic studies and admission preparation, was greater than 30 minutes.    Chart generated using Dragon voice dictation software and may contain dictation errors. Please contact me for any clarification or with any questions.

## 2024-02-02 NOTE — ED Notes (Signed)
 Reported off via telephone to Therapist, Sports.    Wes Echevaria, RN 02/02/24 1820

## 2024-02-02 NOTE — H&P (Signed)
 [facility] [place] [facility]ist Admission History and Physical    Chief Complaint  Generalized weakness and tingling and numbness   HPI  Mary Franco is a 58 y.o. year old female with a PMH of hypertension, hyperlipidemia, GERD, chronic history of hypomagnesemia hypokalemia follows with nephrology, history of atrial fibrillation on Eliquis  for stroke prevention, history of CVA who was admitted at [facility] last night for generalized weakness fatigue and diffuse numbness and tingling. Patient was recently treated with doxycycline  and steroids for COPD/sinusitis/bronchitis.  But per patient she only took the steroid for 2 days and doxycycline  she had only 1 day left   she reported for persistent coughing and wheezing and sinus pain and headache and also reported for frequent upper respiratory tract infection viral bronchitis. Currently denied for any chest pain, previously, fevers, chills, diarrhea, nausea, vomiting but has abdominal pain and was supposed to get colonoscopy.   ED course: Vitals 137/69 pulse 88 afebrile on room air.  She had soft BP during CDU course lowest up to 99/59 and also had temperature of 100.6 F Labs on admission was magnesium  0.5 and potassium 2.8 calcium  6.5, hemoglobin 10 MCV 73 MCH 23 platelet 535 with RDW 18 COVID, RSV influenza negative And kidney function was also normal  Magnesium  and potassium were replaced with improvement of kidney up to 2.1 potassium 3.2 but calcium  continued to be 6.6. Hemoglobin trended down to 8.6 iron studies with iron deficiency, iron 15, ferritin 20 transferrin saturation 4 TIBC 395 upper limit.  Given patient with downtrending hemoglobin and electrolyte abnormalities and persistent COPD exacerbation hospital medicine service was requested to admit the patient   Assessment and Plan  Mary Franco is a 58 y.o. year old female with a PMH of hypertension, hyperlipidemia, GERD, chronic history of hypomagnesemia hypokalemia  follows with nephrology, history of atrial fibrillation on Eliquis  for stroke prevention, history of CVA who was admitted at [facility] last night for generalized weakness fatigue and diffuse numbness and tingling.  Patient was found to have severe hypomagnesemia hypokalemia hypocalcemia.  There was also concern for COPD exacerbation and acute on chronic anemia so patient was admitted to hospital medicine service   Assessment & Plan Electrolyte abnormality Hypomagnesemia Per nephrology outpatient notes patient has previous history of severe hypomagnesemia, workup was consistent with GI magnesium  losses rather than renal magnesium  wasting, thought to be likely due to diarrhea and Nexium  use. She was supposed to be on oral magnesium  Currently her magnesium  levels are improved after the IV replacement we will continue to monitor with daily magnesium  Hypokalemia Patient follows with nephrology for hypokalemia as well which was thought to be secondary to diarrhea and was on oral potassium supplement at home COPD with acute exacerbation    (CMD) Patient has recently completed outpatient course of doxycycline  but continued to have symptoms CT abdomen pelvis chest has been ordered to further evaluate for pneumonia or bronchitis Continue scheduled DuoNebs and Solu-Medrol IV 40 twice daily for 2 doses and then will start Augmentin  Depending on CT chest without contrast result will decide about antibiotics Will start her on ceftriaxone for now and further close depending on the CT chest with Acute on chronic anemia GERD (gastroesophageal reflux disease) Patient with previously hemoglobin 12 up until 4/29, since then it has been slowly trending down 9-10 currently it is trending down to 8. Given patient is on Eliquis  and has microcytosis hypochromia and elevated RDW concern for slow GI bleeding stool for occult blood ordered Continue monitoring hemoglobin  for now if continues to trend down we will hold Eliquis .   Continue IV Protonix  and depending on the stool results we will consider GI consultation in the morning.  I have reviewed care everywhere unable to find any previous history of EGD or colonoscopy  Chronic combined systolic and diastolic CHF (congestive heart failure)    (CMD) Per review of the echocardiogram from 2024 her EF is 55 to 60% normal mitral valve aortic valve is tricuspid moderate grade 3 plaque involving ascending aorta and descending aorta Currently patient appears euvolemic will continue monitoring Essential hypertension Home medication losartan  metoprolol, as patient blood pressure was soft while in CDU hold losartan  but metoprolol has been resumed we will continue monitor on telemetry and continue monitor her blood pressure History of CVA with residual deficit Patient had a stroke of the left insular and parietal lobes, CT angio at that time showed 50% bilateral carotid bifurcation stenosis. Patient was on aspirin  and Plavix  Paroxysmal atrial fibrillation    (CMD) Patient was found to have A-fib on Holter monitor she is currently on Eliquis  and PFO (patent foramen ovale) (CMD) Echocardiogram 02/25/2022 showed ejection fraction 55 to 60%. No clots. There was evidence of a small right to left.  It was performed during the workup for the stroke  Diet:     Dietary Orders  (From admission, onward)               Ordered    Adult Diet- Regular  Diet effective now       References:    Medical Nutrition Management (MNM) for Registered Dietitian  Question Answer Comment  Diet type: Regular   Medical Nutrition Management By RD Yes, Medical Nutrition Management By RD      02/02/24 0840             Code Status: No Order   DVT Prophylaxis:Eliquis   Anticipated disposition is to Home with home health in 2-3 days.     History  Past Medical and Surgical History, Family History, Social History Medical History[1] Surgical History[2] Family History[3] Social History    Socioeconomic History   Marital status: Unknown    Spouse name: Not on file   Number of children: Not on file   Years of education: Not on file   Highest education level: Not on file  Occupational History   Not on file  Tobacco Use   Smoking status: Every Day    Current packs/day: 0.25    Types: Cigarettes   Smokeless tobacco: Never  Substance and Sexual Activity   Alcohol use: Yes    Comment: Rarely   Drug use: Not Currently    Types: Marijuana   Sexual activity: Not Currently  Other Topics Concern   Not on file  Social History Narrative   Not on file   Social Drivers of Health   Living Situation: Not on file  Food Insecurity: No Food Insecurity (09/21/2022)   Received from [facility]   Food vital sign    Within the past 12 months, you worried that your food would run out before you got the money to buy more.: Never true    Within the past 12 months, the food you bought just didn't last and you didn't have money to get more.: Never true  Transportation Needs: No Transportation Needs (09/21/2022)   Received from [facility] - Transportation    Lack of Transportation (Medical): No    Lack of Transportation (Non-Medical): No  Utilities: Not At Risk (09/21/2022)   Received from [facility] Utilities    Threatened with loss of utilities: No  Safety: Not At Risk (09/21/2022)   Received from [facility]   Safety    Within the last year, have you been afraid of your partner or ex-partner?: No    Within the last year, have you been humiliated or emotionally abused in other ways by your partner or ex-partner?: No    Within the last year, have you been kicked, hit, slapped, or otherwise physically hurt by your partner or ex-partner?: No    Within the last year, have you been raped or forced to have any kind of sexual activity by your partner or ex-partner?: No  Alcohol Screening: Not on file  Tobacco Use: High Risk (02/01/2024)   Patient  History    Smoking Tobacco Use: Every Day    Smokeless Tobacco Use: Never    Passive Exposure: Not on file  Depression: At Risk (12/22/2023)   PHQ-2    PHQ-2 Score: 4  Social Connections: Socially Isolated (09/21/2022)   Received from [facility]   Social Connection and Isolation Panel    In a typical week, how many times do you talk on the phone with family, friends, or neighbors?: More than three times a week    How often do you get together with friends or relatives?: More than three times a week    How often do you attend church or religious services?: Never    Do you belong to any clubs or organizations such as church groups, unions, fraternal or athletic groups, or school groups?: No    How often do you attend meetings of the clubs or organizations you belong to?: Never    Are you married, widowed, divorced, separated, never married, or living with a partner?: Divorced  Physicist, Medical Strain: Low Risk (09/21/2022)   Received from [facility]   Overall Financial Resource Strain (CARDIA)    Difficulty of Paying Living Expenses: Not hard at all      Allergies  Allergies[4]   Home Medications  Home Medications     Med List Status: In progress Set By: Glennis CHRISTELLA Mayotte, CPhT at 02/02/2024  4:22 PM          * acetaminophen  (TYLENOL ) 500 mg tablet   * albuterol  HFA (PROVENTIL  HFA;VENTOLIN  HFA;PROAIR  HFA) 90 mcg/actuation inhaler   * ALPRAZolam  (XANAX ) 2 mg tablet   * apixaban  (ELIQUIS ) 5 mg tab   * bisoprolol  (ZEBETA ) 10 mg tablet   * cetirizine  (ZyrTEC ) 10 mg tablet   * cholecalciferol (VITAMIN D3) 1,000 unit (25 mcg) tablet   * cyanocobalamin (VITAMIN B12) 1,000 mcg tablet   * esomeprazole  (NexIUM ) 40 mg DR capsule   * fluticasone  propionate (FLONASE ) 50 mcg/spray nasal spray   * gabapentin  (NEURONTIN ) 300 mg capsule   * losartan  (COZAAR ) 25 mg tablet   * magnesium  chloride (SLOW-MAG) 71.5 mg TbEC tablet    Take 2 tablets (143 mg total) by mouth 2 (two) times a  day.    Patient taking differently: Take 2 tablets by mouth 2 (two) times a day. Taking 1 tablet daily   * montelukast (SINGULAIR) 10 mg tablet   * potassium chloride  20 mEq ER tablet   * Spiriva Respimat 2.5 mcg/actuation mist inhaler        Review of Systems  Pertinent items are noted in HPI.    Physical Exam  Temp:  [97.7 F (  36.5 C)-100.6 F (38.1 C)] 98.1 F (36.7 C) Heart Rate:  [69-93] 88 Resp:  [15-35] 17 BP: (99-137)/(55-101) 137/69 Body mass index is 29.78 kg/m.  On examination-  GEN: NAD, lying in bed EYES: Pupils are bilaterally equal and reactive to light ENT: MMM on room air Lungs poor air entry bilateral expiratory wheezing no respiratory distress or increased work of breathing CVS exam- Regular Rate and rhythm, No murmurs, rubs or gallops.  Abd: soft NT/ND, BS +ve Extremities - no edema. Neuro: A+Ox3, no focal deficits, 5/5 UE LE strength. no facial asymmetry. Finger to nose intact.  PSYCH: appropriate GU: No CVA tenderness MSK: No spinal tenderness *  Labs and Results  I have reviewed the following labs and results:  Labs: Lab Results  Component Value Date   WBC 4.59 02/02/2024   HGB 8.6 (L) 02/02/2024   HCT 26.8 (L) 02/02/2024   MCV 74.9 (L) 02/02/2024   PLT 419 02/02/2024   Lab Results  Component Value Date   GLUCOSE 105 (H) 02/02/2024   CALCIUM  6.6 (L) 02/02/2024   NA 138 02/02/2024   K 3.2 (L) 02/02/2024   CO2 27 02/02/2024   CL 106 02/02/2024   BUN 7 02/02/2024   CREATININE 0.75 02/02/2024   Lab Results  Component Value Date   ALT 12 02/01/2024   AST 12 (L) 02/01/2024   BILITOT 0.3 02/01/2024   No results found for: INR, PROTIME  Micro: No results found for this visit on 02/01/24 (from the past 48 hours).  Radiology: No orders to display    EKG: Encounter Date: 02/01/24  ECG 12 lead   Narrative   Ventricular Rate                   83        BPM                  Atrial Rate                        83        BPM                   P-R Interval                       132       ms                   QRS Duration                       64        ms                   Q-T Interval                       418       ms                   QTC Calculation Bazett             491       ms                   Calculated P Axis                  45  degrees              Calculated R Axis                  24        degrees              Calculated T Axis                  38        degrees               Sinus rhythm Nonspecific ST-T changes Prolonged QT interval, consider electrolyte imbalance or drug effects No previous ECGs available Confirmed by Rojelio Dunnings  [phone]  on 02-02-2024 8 14 29  AM  ECG 12 lead   Narrative   Ventricular Rate                   76        BPM                  Atrial Rate                        76        BPM                  P-R Interval                       146       ms                   QRS Duration                       74        ms                   Q-T Interval                       442       ms                   QTC Calculation Bazett             497       ms                   Calculated P Axis                  69        degrees              Calculated R Axis                  43        degrees              Calculated T Axis                  53        degrees               Sinus rhythm Prolonged QT Confirmed by Launie Stanford  [id number]  on 02-02-2024 2 57 23 PM      Electronically signed by: Glo Stanly Oris, MD 02/02/2024 4:36 PM         [1] Past Medical History: Diagnosis  Date   Anxiety    Headache    Hypertension   [2] Past Surgical History: Procedure Laterality Date   KNEE SURGERY     Procedure: KNEE SURGERY  [3] Family History Problem Relation Name Age of Onset   Lymphoma Mother     Kidney disease Father         on dialysis   Diabetes Father     Hypotension Father    [4] Allergies Allergen Reactions   Pholcodine Hives and Itching   Codeine  Hives   Ezetimibe  Swelling   Spironolactone      Electrolyte imbalance   Sulfa (Sulfonamide Antibiotics) Hives  *Some images could not be shown.

## 2024-02-05 NOTE — Discharge Summary (Addendum)
 "  Hospital Medicine Discharge Summary   Demographics: Mary Franco  58 y.o. [birth year] MRN: [id number]    Extended Emergency Contact Information Primary Emergency Contact: Sopher,Felica Mobile Phone: [phone] Relation: Daughter  Full Code  Admit Date: 02/01/2024                            Attending Physician: Jama Rome Louder, MD Discharge Date: 02/05/2024  Primary Care Provider: Norleen Dave Dottie PONCE, MD   [phone]  Consults during this admission: Consult Orders             IP CONSULT TO GASTROENTEROLOGY       Specialty:  Gastroenterology  Provider:  (Not yet assigned)      IP CONSULT TO HOSPITALIST       Provider:  (Not yet assigned)             Active & Resolved Diagnosis: Principal Problem:   Electrolyte abnormality Active Problems:   Chronic combined systolic and diastolic CHF (congestive heart failure)    (CMD)   Essential hypertension   GERD (gastroesophageal reflux disease)   History of CVA with residual deficit   Hypomagnesemia   Paroxysmal atrial fibrillation    (CMD)   Hypokalemia   PFO (patent foramen ovale) (CMD)   Vitamin D  deficiency   COPD with acute exacerbation    (CMD)   Acute on chronic anemia Resolved Problems:   * No resolved hospital problems. *  Disposition: Patient discharged to home   Scheduled Future Appointments       Provider Department Dept Phone Center   05/03/2024 10:20 AM [facility]  - [facility] [phone] [facility]      Hospital Course: Mary Franco is a 58 year old female with a history of chronic obstructive pulmonary disease (COPD), hypertension, hyperlipidemia, gastroesophageal reflux disease (GERD), chronic hypomagnesemia and hypokalemia, atrial fibrillation on apixaban , prior cerebrovascular accident (CVA), and congestive heart failure, who presented with generalized weakness, fatigue, and diffuse numbness and tingling.  On admission,  she was found to have severe hypomagnesemia (0.5 mg/dL), hypokalemia (2.8 mmol/L), and hypocalcemia (6.5 mg/dL), with a downtrending hemoglobin (initially 10.0 g/dL, later as low as 8.6 g/dL) and microcytic indices, raising concern for acute on chronic anemia. Her symptoms were attributed to electrolyte abnormalities, and she was admitted for cardiac monitoring, frequent neurologic reassessment, and further evaluation.  Electrolyte replacement was initiated with intravenous and oral potassium and magnesium , resulting in improvement of serum levels (magnesium  up to 1.9 mg/dL, potassium up to 3.9 mmol/L). Her tingling resolved, though she continued to report mild weakness. Calcium  remained low throughout the admission. Cardiac monitoring revealed sinus rhythm with prolonged QT interval, attributed to electrolyte imbalance.  She experienced a COPD exacerbation, for which she received scheduled DuoNebs, intravenous Solu-Medrol, and antibiotics (initially ceftriaxone, later azithromycin  after intolerance to intravenous doxycycline ). CT chest without contrast showed mild centrilobular emphysema and main pulmonary artery dilatation, but no acute cardiopulmonary process or pneumonia. Oxygen saturation remained stable (93-100% on room air at rest and with exertion).  Her anemia was evaluated with iron studies (iron 15 g/dL, ferritin 20 ng/mL, transferrin saturation 4%), and gastroenterology was consulted for further workup. She denied overt GI bleeding, and stool occult blood was ordered. EGD and colonoscopy were deferred due to recent anticoagulation and planned for outpatient follow-up.  Other issues addressed during admission included monitoring of congestive heart failure (euvolemic, EF 55-60%), essential hypertension (blood pressure  ranged 129/73-156/83), paroxysmal atrial fibrillation (rate controlled, apixaban  resumed), GERD (continued IV Protonix ), and vitamin D  deficiency (supplemented). Losartan  was held  during periods of soft blood pressure.  In summary, the principal problem of severe electrolyte abnormality was managed with aggressive replacement and monitoring, resulting in clinical and laboratory improvement. Secondary issues included acute on chronic anemia, COPD exacerbation, and management of chronic comorbidities, all of which were stabilized during the hospitalization.         Wound / Incision Assessment: Refer to Chart Review and Media Tab for images if available.     Vital Sign Range:  Temp:  [97.5 F (36.4 C)-98.6 F (37 C)] 97.9 F (36.6 C) Heart Rate:  [60-79] 71 Resp:  [16-19] 18 BP: (135-184)/(56-86) 150/70  General appearance -awake and alert.  Appears comfortable with no acute distress Mental status - answering questions appropriately, mood appropriate for situation Eyes - pupils equal and reactive, extraocular eye movements intact Mouth - mucous membranes moist, pharynx normal without lesions Chest -decreased breath sounds, no wheeze Heart - normal rate, regular rhythm, normal S1, S2, no murmurs, rubs, clicks or gallops Abdomen - soft, nontender, nondistended, no masses or organomegaly    Anticoagulant Medications     Direct Factor Xa Inhibitors Start End   * apixaban  (ELIQUIS ) 5 mg tab ([Paused] since 02/05/2024  1:52 PM) 07/01/2023 --   Class: Historical Med         Discharge Medications     PAUSE taking these medications      Sig Disp Refill Start End  apixaban  5 mg Tab Wait to take this until: February 14, 2024 Commonly known as: ELIQUIS   Take 5 mg by mouth 2 (two) times a day.   0         New Medications      Sig Disp Refill Start End  cefdinir  300 mg capsule Commonly known as: OMNICEF   Take 1 capsule (300 mg total) by mouth 2 (two) times a day for 4 days.  8 capsule  0         Modified Medications      Sig Disp Refill Start End  fluticasone  propionate 50 mcg/spray nasal spray Commonly known as: FLONASE  What changed: See  the new instructions.  fluticasone  propionate 50 mcg/actuation nasal spray,suspension   0     magnesium  chloride 71.5 mg Tbec tablet Commonly known as: SLOW-MAG What changed: when to take this  Take 2 tablets (143 mg total) by mouth 3 (three) times a day.  180 tablet  2     potassium chloride  20 mEq ER tablet What changed: when to take this  Take 1 tablet (20 mEq total) by mouth 2 (two) times a day.  180 tablet  0         Medications To Continue      Sig Disp Refill Start End  acetaminophen  500 mg tablet Commonly known as: TYLENOL   Take 1,000 mg by mouth as needed.   0     albuterol  HFA 90 mcg/actuation inhaler Commonly known as: PROVENTIL  HFA;VENTOLIN  HFA;PROAIR  HFA  2 puffs every 6 (six) hours as needed for wheezing or shortness of breath.   0     ALPRAZolam  2 mg tablet Commonly known as: XANAX   Take 2 mg by mouth 3 (three) times a day as needed.   0     bisoprolol  10 mg tablet Commonly known as: ZEBETA   Take 20 mg by mouth daily.   0     cetirizine  10  mg tablet Commonly known as: ZyrTEC   Take 10 mg by mouth daily.   0     cholecalciferol 1,000 unit (25 mcg) tablet Commonly known as: VITAMIN D3  Take 1,000 Units by mouth daily.   0     cyanocobalamin 1,000 mcg tablet Commonly known as: VITAMIN B12  Take 1,000 mcg by mouth daily.   0     esomeprazole  40 mg DR capsule Commonly known as: NexIUM   Take 40 mg by mouth every morning before breakfast.   0     gabapentin  300 mg capsule Commonly known as: NEURONTIN   Take 300 mg by mouth as needed.   0     losartan  50 mg tablet Commonly known as: COZAAR   Take 50 mg by mouth daily.   0     montelukast 10 mg tablet Commonly known as: SINGULAIR  Take 1 tablet by mouth daily.   0     Spiriva Respimat 2.5 mcg/actuation Mist inhaler Generic drug: tiotropium bromide  Inhale 2 puffs in the morning.   0         Discharge Orders     Ambulatory referral to PCP     Full Code     Lifting Limits:      Details:    Lifting Limits: No lifting limits   Regular diet     Details:    Diet type: Regular   Ambulatory referral to Gastroenterology     Details:    Reason for Referral: General GI Consult   Comments: Consideration for EGD/colonoscopy         Lab Results  Component Value Date/Time   HGB 8.5 (L) 02/05/2024 12:42 AM   HCT 26.7 (L) 02/05/2024 12:42 AM   WBC 14.58 (H) 02/05/2024 12:42 AM   PLT 533 (H) 02/05/2024 12:42 AM   Lab Results  Component Value Date/Time   NA 138 02/05/2024 12:42 AM   K 4.0 02/05/2024 12:42 AM   CREATININE 0.99 02/05/2024 12:42 AM   BUN 11 02/05/2024 12:42 AM   GLUCOSE 196 (H) 02/05/2024 12:42 AM    Pertinent Imaging: No orders to display    Predictive Model Details        21.1% (Medium)  Factor Value   Calculated 02/05/2024 12:04 8% Latest hemoglobin in last 72 hrs 8.5 g/dL   Readmission Risk Score v2 Model 7% Number of hospitalizations in last year 0    7% Diagnosis of fluid or electrolyte disorder present    7% Number of appointments in last 90 days 1    7% Latest RDW in last 72 hrs 18.4 %     Electronically signed by: Jama Rome Louder, MD 02/05/2024 2:27 PM   Time spent on discharge: 35 minutes *Some images could not be shown."

## 2024-02-07 ENCOUNTER — Telehealth: Payer: Self-pay

## 2024-02-07 NOTE — Transitions of Care (Post Inpatient/ED Visit) (Signed)
" ° °  02/07/2024  Name: Mary Franco MRN: [id number] DOB: Nov 26, 1965  Today's TOC FU Call Status: Today's TOC FU Call Status:: Unsuccessful Call (2nd Attempt) Unsuccessful Call (2nd Attempt) Date: 02/07/24  Attempted to reach the patient regarding the most recent Inpatient/ED visit.  Follow Up Plan: Additional outreach attempts will be made to reach the patient to complete the Transitions of Care (Post Inpatient/ED visit) call.    Bing Edison MSN, RN RN [employer]  [facility] Office Hours M-F [phone] Direct Dial: [phone] Main Phone [phone]  Fax: [phone] [email or web address]    "

## 2024-02-07 NOTE — Transitions of Care (Post Inpatient/ED Visit) (Signed)
" ° °  02/07/2024  Name: Mary Franco MRN: [id number] DOB: [birth year]  Today's TOC FU Call Status: Today's TOC FU Call Status:: Unsuccessful Call (1st Attempt) Unsuccessful Call (1st Attempt) Date: 02/07/24  Attempted to reach the patient regarding the most recent Inpatient/ED visit.  Follow Up Plan: Additional outreach attempts will be made to reach the patient to complete the Transitions of Care (Post Inpatient/ED visit) call.    Bing Edison MSN, RN RN [employer]  [facility] Office Hours M-F [phone] Direct Dial: [phone] Main Phone [phone]  Fax: [phone] [email or web address]    "

## 2024-02-08 ENCOUNTER — Telehealth: Payer: Self-pay

## 2024-02-08 DIAGNOSIS — J329 Chronic sinusitis, unspecified: Secondary | ICD-10-CM

## 2024-02-08 NOTE — Patient Instructions (Signed)
 Visit Information  Thank you for taking time to visit with me today. Please don't hesitate to contact me if I can be of assistance to you before our next scheduled telephone appointment.  Telephone follow up appointment with care management team member scheduled for:  02/23/23 3 pm The patient will call PCP as advised to schedule HFU as discussed/rationale explained. .   Following is a copy of your care plan:   Goals Addressed             This Visit's Progress    VBCI Transitions of Care (TOC) Care Plan       Problems:  Recent Hospitalization for treatment of electrolyte abnormalities and COPD exacerbation Knowledge Deficit Related to electrolyte abnormalities, and No Hospital Follow Up Provider appointment for HFU.   Goal:  Over the next 30 days, the patient will not experience hospital readmission  Interventions:  Transitions of Care: Vaccines  Doctor Visits  - discussed the importance of doctor visits Post discharge activity limitations prescribed by provider reviewed Reviewed upcoming colonoscopy Medication review completed from outside medication reconciliation. Discharge instructions reviewed Allergies reviewed.  SDOH needs reviewed, may need referral later.   Patient Self Care Activities:  Attend all scheduled provider appointments Attend church or other social activities Call pharmacy for medication refills 3-7 days in advance of running out of medications Notify RN Care Manager of TOC call rescheduling needs Participate in Transition of Care Program/Attend TOC scheduled calls Perform all self care activities independently  Perform IADL's (shopping, preparing meals, housekeeping, managing finances) independently Take medications as prescribed   Start taking blood pressure 3 x/week and keeping a log to take to providers. Contact counselor/therapist to start treatment if desired.  Re-Contact Medicaid Case Manager for any additional resources and has contact  information.   Plan:  Telephone follow up appointment with care management team member scheduled for:  02/23/23 3 pm The patient will call PCP as advised to schedule HFU as discussed/rationale explained. .   Patient understands discharge instructions and what to call provider for if condition worsens.  Patient to call to to schedule PCP HFU per preference after rationale provided/education on need.  Patient has specialist follow up appointments for colonoscopy and EGD.  The patient has been provided with contact information for the care management team and has been advised to call with any health-related questions or concerns.  The patient verbalized understanding with current POC and to contact PCP or specialist for any urgent questions or concerns.  The patient is directed to their insurance card regarding availability of benefits coverage  No changes to SDOH, patient reported they understood discharge instructions, medication review completed, medications obtained and understood what they are for and when to take, what to call provider for, no issues reported on assessment items, and no red flags noted on this call unless documented under assessment.     Telephone follow up appointment with care management team member scheduled for:  02/23/23 3 pm The patient will call PCP as advised to schedule HFU as discussed/rationale explained. .        Patient verbalizes understanding of instructions and care plan provided today and agrees to view in MyChart. Active MyChart status and patient understanding of how to access instructions and care plan via MyChart confirmed with patient.     Telephone follow up appointment with care management team member scheduled for:  Telephone follow up appointment with care management team member scheduled for:  02/23/23 3 pm The patient will  call PCP as advised to schedule HFU as discussed/rationale explained. .   Please call the care guide team at [phone] if  you need to cancel or reschedule your appointment.   Please call the [place]  National Suicide Prevention Lifeline: 7787348536 or TTY: 6173728987 TTY [phone]) to talk to a trained counselor call 1-800-273-TALK (toll free, 24 hour hotline) if you are experiencing a Mental Health or Behavioral Health Crisis or need someone to talk to.   Bing Edison MSN, RN RN [employer]  [facility]Population Health Office Hours M-F [phone] Direct Dial: [phone] Main Phone [phone]  Fax: [phone] [email or web address]

## 2024-02-08 NOTE — Transitions of Care (Post Inpatient/ED Visit) (Signed)
 Today's TOC FU Call Status: Today's TOC FU Call Status:: Successful TOC FU Call Completed TOC FU Call Complete Date: 02/08/24  Patient's Name and Date of Birth confirmed. Name, DOB  Transition Care Management Follow-up Telephone Call Primary Inpatient Discharge Diagnosis:: Electrolyte abnormality How have you been since you were released from the hospital?: Better Any questions or concerns?: No  Items Reviewed: Did you receive and understand the discharge instructions provided?: Yes Medications obtained,verified, and reconciled?: Yes (Medications Reviewed) Any new allergies since your discharge?: No Dietary orders reviewed?: NA Do you have support at home?: Yes People in Home [RPT]: sibling(s) Name of Support/Comfort Primary Source: Sister, did not name.  Medications Reviewed Today: Medications Reviewed Today     Reviewed by Carolee Heron NOVAK, RN (Case Manager) on 02/08/24 at 1515  Med List Status: <None>   Medication Order Taking? Sig Documenting Provider Last Dose Status Informant  acetaminophen  (TYLENOL ) 500 MG tablet [id number] Yes Take 1,000 mg by mouth every 6 (six) hours as needed for moderate pain. [provider]  Active Self  albuterol  (PROVENTIL ) (2.5 MG/3ML) 0.083% nebulizer solution [id number] Yes Take 3 mLs (2.5 mg total) by nebulization every 4 (four) hours as needed for wheezing or shortness of breath (Use instead of the MDI if needed.). [name], MD  Active   albuterol  (VENTOLIN  HFA) 108 [phone] Base) MCG/ACT inhaler [id number] Yes Inhale 2 puffs into the lungs every 4 (four) hours as needed for wheezing or shortness of breath. [name], MD  Active   alprazolam  (XANAX ) 2 MG tablet [id number] Yes Take 2 mg by mouth at bedtime. [provider]  Active Self           Med Note STEPHEN, MICHELE   Fri Mar 25, 2018  1:13 PM)    amitriptyline  (ELAVIL ) 25 MG tablet [id number]  Take 1 tablet (25 mg total) by mouth at bedtime.  Patient not taking:  Reported on 02/08/2024   Whitfield Raisin, NP  Active   apixaban  (ELIQUIS ) 5 MG TABS tablet [id number]  Take 1 tablet (5 mg total) by mouth 2 (two) times daily.  Patient not taking: Reported on 02/08/2024   Nicholaus Credit, PA-C  Active   B Complex Vitamins (VITAMIN B COMPLEX PO) [id number] Yes Take by mouth. [provider]  Active   bisoprolol  (ZEBETA ) 10 MG tablet [id number] Yes Take 2 tablets (20 mg total) by mouth daily. Nicholaus Credit, PA-C  Active   budesonide -formoterol  (SYMBICORT ) 160-4.5 MCG/ACT inhaler [id number]  Inhale into the lungs.  Patient not taking: Reported on 02/08/2024   [provider]  Active   cefdinir  (OMNICEF ) 300 MG capsule [id number] Yes Take 1 capsule (300 mg total) by mouth 2 (two) times daily.  Patient taking differently: Take 300 mg by mouth 2 (two) times daily. Take for 4 days per 02/05/24 discharge and patient review.   [name], MD  Active            Med Note SYDELL, HERON NOVAK Debar Feb 08, 2024  3:14 PM) Started on discharge 02/05/24  cetirizine  (ZYRTEC  ALLERGY) 10 MG tablet [id number] Yes Take 1 tablet (10 mg total) by mouth daily. Nicholaus Credit, PA-C  Active   cholecalciferol (VITAMIN D3) 25 MCG (1000 UNIT) tablet [id number] Yes Take 1,000 Units by mouth daily. [provider]  Active   cyanocobalamin (VITAMIN B12) 1000 MCG tablet [id number] Yes Take 1,000 mcg by mouth. [provider]  Active   esomeprazole  (NEXIUM ) 40  MG capsule [id number] Yes Take 40 mg by mouth daily at 12 noon. [provider]  Active   fluconazole  (DIFLUCAN ) 150 MG tablet [id number]  Take 1 tablet (150 mg total) by mouth daily.  Patient not taking: Reported on 02/08/2024   Randol Simmonds, MD  Active   Fluticasone -Umeclidin-Vilant (TRELEGY ELLIPTA ) 100-62.5-25 MCG/ACT AEPB [id number]  Take 1 Inhalation by mouth daily.  Patient not taking: Reported on 02/08/2024   [name], MD  Active   gabapentin  (NEURONTIN ) 300 MG capsule [id number] Yes Take  1 capsule (300 mg total) by mouth at bedtime.  Patient taking differently: Take 300 mg by mouth at bedtime as needed.   Nicholaus Credit, PA-C  Active   ketoconazole (NIZORAL) 2 % shampoo [id number]  SHAMPOO TOPICALLY ONCE DAILY AS NEEDED  Patient not taking: Reported on 02/08/2024   [provider]  Active   losartan  (COZAAR ) 50 MG tablet [id number] Yes Take 1 tablet by mouth once daily Revankar, Rajan R, MD  Active   magnesium  chloride (SLOW-MAG) 64 MG TBEC SR tablet [id number]  Take 143 mg by mouth.  Patient not taking: Reported on 02/08/2024   [provider]  Active   magnesium  chloride (SLOW-MAG) 64 MG TBEC SR tablet [id number] Yes Take 2 tablets by mouth 3 (three) times daily. New discharge medication. [provider]  Active   MAGNESIUM -OXIDE 400 (240 Mg) MG tablet [id number]  Take 1 tablet (400 mg total) by mouth daily.  Patient not taking: Reported on 02/08/2024   Nicholaus Credit, PA-C  Active   montelukast (SINGULAIR) 10 MG tablet [id number] Yes Take 10 mg by mouth daily. [provider]  Active   ondansetron  (ZOFRAN ) 4 MG tablet [id number]  Take 1 tablet (4 mg total) by mouth every 8 (eight) hours as needed for nausea or vomiting.  Patient not taking: Reported on 02/08/2024   Charlene Clotilda PARAS, NP  Active Self  potassium chloride  SA (KLOR-CON  M) 20 MEQ tablet [id number] Yes Take 1 tablet (20 mEq total) by mouth daily. Nicholaus Credit, PA-C  Active   predniSONE  (STERAPRED UNI-PAK 21 TAB) 5 MG (21) TBPK tablet [id number] Yes Take by mouth.  Patient taking differently: Take by mouth. Patient reported she never finished dose pack and resumed on discharge of 02/05/24, Needs review by PCP.   [provider]  Active   promethazine -dextromethorphan (PROMETHAZINE -DM) 6.25-15 MG/5ML syrup [id number]  Take 5 mLs by mouth 4 (four) times daily as needed.  Patient not taking: Reported on 02/08/2024   Nicholaus Credit, PA-C  Active   rosuvastatin  (CRESTOR ) 20 MG tablet  [id number]  Take 1 tablet (20 mg total) by mouth daily.  Patient not taking: Reported on 02/08/2024   Nicholaus Credit, PA-C  Active   rosuvastatin  (CRESTOR ) 40 MG tablet [id number]  Take 40 mg by mouth daily.  Patient not taking: Reported on 02/08/2024   [provider]  Active   Tiotropium Bromide (SPIRIVA RESPIMAT) 2.5 MCG/ACT AERS [id number] Yes Inhale 2.5 mcg into the lungs in the morning. 2 puffs inhaled every morning. [provider]  Active             Home Care and Equipment/Supplies: Were Home Health Services Ordered?: No Any new equipment or medical supplies ordered?: No  Functional Questionnaire: Do you need assistance with bathing/showering or dressing?: No Do you need assistance with meal preparation?: No Do you need assistance with eating?: No Do you have difficulty maintaining continence: No Do you need assistance with getting out of  bed/getting out of a chair/moving?: No Do you have difficulty managing or taking your medications?: No  Follow up appointments reviewed: PCP Follow-up appointment confirmed?: No MD Provider Line Number:[phone] Given: No Specialist Hospital Follow-up appointment confirmed?: No Date of Specialist follow-up appointment?:  (Pending call from GI consult.) Follow-Up Specialty Provider:: Ambulatory referral to Gastroenterology.Consideration for EGD/colonoscopy Do you need transportation to your follow-up appointment?: No Do you understand care options if your condition(s) worsen?: Yes-patient verbalized understanding  SDOH Interventions Today    Flowsheet Row Most Recent Value  SDOH Interventions   Food Insecurity Interventions Intervention Not Indicated, Community Resources Provided, Other (Comment)  [Has been in contact with Medicaid Case Manager]  Housing Interventions Intervention Not Indicated, Patient Declined  Transportation Interventions Intervention Not Indicated, Payor Benefit, Patient Resources (Friends/Family)   Utilities Interventions Intervention Not Indicated, Other (Comment)  [Light bill was paid by Medicaid while hospitalized, May need help in future.]  Depression Interventions/Treatment  Currently on Treatment, Medication    Goals Addressed             This Visit's Progress    VBCI Transitions of Care (TOC) Care Plan       Problems:  Recent Hospitalization for treatment of electrolyte abnormalities and COPD exacerbation Knowledge Deficit Related to electrolyte abnormalities, and No Hospital Follow Up Provider appointment for HFU.   Goal:  Over the next 30 days, the patient will not experience hospital readmission  Interventions:  Transitions of Care: Vaccines  Doctor Visits  - discussed the importance of doctor visits Post discharge activity limitations prescribed by provider reviewed Reviewed upcoming colonoscopy Medication review completed from outside medication reconciliation. Discharge instructions reviewed Allergies reviewed.  SDOH needs reviewed, may need referral later.   Patient Self Care Activities:  Attend all scheduled provider appointments Attend church or other social activities Call pharmacy for medication refills 3-7 days in advance of running out of medications Notify RN Care Manager of TOC call rescheduling needs Participate in Transition of Care Program/Attend TOC scheduled calls Perform all self care activities independently  Perform IADL's (shopping, preparing meals, housekeeping, managing finances) independently Take medications as prescribed   Start taking blood pressure 3 x/week and keeping a log to take to providers. Contact counselor/therapist to start treatment if desired.  Re-Contact Medicaid Case Manager for any additional resources and has contact information.   Plan:  Telephone follow up appointment with care management team member scheduled for:  02/23/23 3 pm The patient will call PCP as advised to schedule HFU as discussed/rationale  explained. .  Patient understands discharge instructions and what to call provider for if condition worsens.  Patient to call to to schedule PCP HFU per preference after rationale provided/education on need.  Patient has specialist follow up appointments for colonoscopy and EGD pending call from referral.   The patient has been provided with contact information for the care management team and has been advised to call with any health-related questions or concerns.  The patient verbalized understanding with current POC and to contact PCP or specialist for any urgent questions or concerns.  The patient is directed to their insurance card regarding availability of benefits coverage and has Medicaid CM contact number from recent contact.  No changes to SDOH, patient reported they understood discharge instructions, medication review completed, medications obtained and understood what they are for and when to take, what to call provider for, no issues reported on assessment items, and no red flags noted on this call unless documented under assessment.  Telephone follow up appointment with care management team member scheduled for:  02/23/23 3 pm The patient will call PCP as advised to schedule HFU as discussed/rationale explained. .        Telephone follow up appointment with care management team member scheduled for:  02/23/23 3 pm The patient will call PCP as advised to schedule HFU as discussed/rationale explained. Encounter notes routed to PCP via EPIC.   Bing Edison MSN, RN RN [employer]  [facility]Population Health Office Hours M-F [phone] Direct Dial: [phone] Main Phone [phone]  Fax: [phone] [email or web address]

## 2024-02-14 ENCOUNTER — Other Ambulatory Visit: Payer: Self-pay

## 2024-02-14 DIAGNOSIS — I5042 Chronic combined systolic (congestive) and diastolic (congestive) heart failure: Secondary | ICD-10-CM

## 2024-02-14 DIAGNOSIS — I10 Essential (primary) hypertension: Secondary | ICD-10-CM

## 2024-02-14 DIAGNOSIS — Z79899 Other long term (current) drug therapy: Secondary | ICD-10-CM

## 2024-02-16 ENCOUNTER — Ambulatory Visit

## 2024-02-16 MED ORDER — LOSARTAN POTASSIUM 50 MG PO TABS: 50.0000 mg | ORAL_TABLET | Freq: Every day | ORAL | 0 refills | Status: DC

## 2024-02-17 ENCOUNTER — Ambulatory Visit

## 2024-02-17 ENCOUNTER — Encounter

## 2024-02-17 DIAGNOSIS — I639 Cerebral infarction, unspecified: Secondary | ICD-10-CM | POA: Diagnosis not present

## 2024-02-18 LAB — CUP PACEART REMOTE DEVICE CHECK
Date Time Interrogation Session: 20251231232749
Implantable Pulse Generator Implant Date: 20240610

## 2024-02-19 ENCOUNTER — Ambulatory Visit: Payer: Self-pay | Admitting: Cardiology

## 2024-02-21 ENCOUNTER — Ambulatory Visit (HOSPITAL_BASED_OUTPATIENT_CLINIC_OR_DEPARTMENT_OTHER)
Admission: RE | Admit: 2024-02-21 | Discharge: 2024-02-21 | Disposition: A | Discharge status: Home / Self Care | Source: Ambulatory Visit | Attending: Family Medicine | Admitting: Family Medicine

## 2024-02-21 ENCOUNTER — Encounter (HOSPITAL_BASED_OUTPATIENT_CLINIC_OR_DEPARTMENT_OTHER): Payer: Self-pay | Admitting: Family Medicine

## 2024-02-21 ENCOUNTER — Ambulatory Visit (HOSPITAL_BASED_OUTPATIENT_CLINIC_OR_DEPARTMENT_OTHER): Admitting: Family Medicine

## 2024-02-21 VITALS — BP 116/74 | HR 69 | Temp 98.5°F | Resp 17 | Wt 162.2 lb

## 2024-02-21 DIAGNOSIS — G8929 Other chronic pain: Secondary | ICD-10-CM

## 2024-02-21 DIAGNOSIS — M25512 Pain in left shoulder: Secondary | ICD-10-CM

## 2024-02-21 DIAGNOSIS — D649 Anemia, unspecified: Secondary | ICD-10-CM | POA: Diagnosis not present

## 2024-02-21 DIAGNOSIS — E876 Hypokalemia: Secondary | ICD-10-CM | POA: Diagnosis not present

## 2024-02-21 DIAGNOSIS — G542 Cervical root disorders, not elsewhere classified: Secondary | ICD-10-CM | POA: Diagnosis not present

## 2024-02-21 DIAGNOSIS — F17210 Nicotine dependence, cigarettes, uncomplicated: Secondary | ICD-10-CM

## 2024-02-21 DIAGNOSIS — J320 Chronic maxillary sinusitis: Secondary | ICD-10-CM | POA: Diagnosis not present

## 2024-02-21 HISTORY — DX: Other chronic pain: G89.29

## 2024-02-21 MED ORDER — AMOXICILLIN-POT CLAVULANATE 875-125 MG PO TABS: 1.0000 | ORAL_TABLET | Freq: Two times a day (BID) | ORAL | 0 refills | Status: DC

## 2024-02-21 MED ORDER — METHYLPREDNISOLONE 4 MG PO TBPK: ORAL_TABLET | ORAL | 0 refills | Status: DC

## 2024-02-21 NOTE — Assessment & Plan Note (Signed)
 Continues to smoke approximately 10 cigarettes per day. Acknowledges difficulty in quitting despite attempts to reduce smoking. Smoking may contribute to sinusitis and other health issues.

## 2024-02-21 NOTE — Assessment & Plan Note (Signed)
 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  She is on a tight budget and declines PT for now. Orders:   DG Shoulder Left

## 2024-02-21 NOTE — Assessment & Plan Note (Signed)
Orders:    Magnesium

## 2024-02-21 NOTE — Progress Notes (Signed)
 "  Established Patient Office Visit  Subjective   Patient ID: Mary Franco, female    DOB: [birth year]  Age: 59 y.o. MRN: [id number]  Chief Complaint  Patient presents with   Hospitalization Follow-up    Hospital Follow-up     Discussed the use of AI scribe software for clinical note transcription with the patient, who gave verbal consent to proceed.  History of Present Illness Mary Franco is a 59 year old female with chronic sinus infections who presents with persistent sinus symptom despite a recent course of Cefdinir  while inhouse at [facility].  Of note, the goal was a TOC visit, but its been over 2 weeks since her discharge.  She is clearly overall improved.  She experiences ongoing symptoms of a sinus infection, including a burning sensation and a feeling of a clogged head. She uses a humidifier for relief, which provides some benefit. She was treated with IV antibiotics during a hospital stay from December 16 to December 20, followed by a four-day course of oral antibiotics, but feels the symptoms were not completely resolved. She has not used decongestant nasal spray recently, although she was prescribed Flonase  a few months ago, which she no longer has. She frequently visits urgent care for sinus infections and has financial difficulties maintaining her medications.  She reports experiencing bronchitis at least four times a year and continues to smoke approximately ten cigarettes a day despite efforts to reduce smoking. Her daughter is critical of her smoking habit.  She describes persistent shoulder pain that began before her recent hospitalization, radiating from her left neck down to her left elbow. She has experienced falls, including one off a bottom step onto concrete, but has not had any imaging or assessment of the shoulder.  She is quick to note that the falls weren't bad and that she still moves the shoulder around fairly well.  She uses topical treatments like Biofreeze for  relief.  Minimal Left neck and arm discomfort.  She has a history of sleep apnea, with a recent sleep study indicating snoring and coughing throughout the night.  She has a history of vitamin deficiencies, particularly magnesium  and potassium, which were addressed during her recent hospitalization with IV supplements. She continues to take large potassium pills and has difficulty managing her medications due to financial constraints.  She has a history of strokes, which she feels have affected her memory, leading to forgetfulness.    Past Medical History:  Diagnosis Date   Anxiety    f/by her GYN   Aortic atherosclerosis 08/04/2022   ASD (atrial septal defect) 07/19/2018   Asthma    Chronic allergic rhinitis 08/04/2022   Congestive heart failure (HCC) 02/15/2018   COPD (chronic obstructive pulmonary disease) (HCC)    f/by Pulmonology in [facility]   Essential hypertension    GERD (gastroesophageal reflux disease)    Hyperlipidemia    Irritable bowel syndrome 06/11/2022   Major depressive disorder 09/15/2011   Migraines    f/by Neurology   NICM (nonischemic cardiomyopathy) (HCC) 03/25/2018   Ovarian failure    Sees GYN in [place]   Paroxysmal atrial fibrillation (HCC) 12/16/2022   f/by Dr. Edwyna   Pernicious anemia    PFO (patent foramen ovale) 08/04/2022   Thromboembolic stroke (HCC) 02/11/2022   f/by Neurology   Tobacco abuse    Unable to quit   Vitamin D  deficiency     Outpatient Encounter Medications as of 02/21/2024  Medication Sig   acetaminophen  (TYLENOL ) 500  MG tablet Take 1,000 mg by mouth every 6 (six) hours as needed for moderate pain.   albuterol  (PROVENTIL ) (2.5 MG/3ML) 0.083% nebulizer solution Take 3 mLs (2.5 mg total) by nebulization every 4 (four) hours as needed for wheezing or shortness of breath (Use instead of the MDI if needed.).   albuterol  (VENTOLIN  HFA) 108 (90 Base) MCG/ACT inhaler Inhale 2 puffs into the lungs every 4 (four) hours as needed for  wheezing or shortness of breath.   alprazolam  (XANAX ) 2 MG tablet Take 2 mg by mouth at bedtime.   amoxicillin -clavulanate (AUGMENTIN ) 875-125 MG tablet Take 1 tablet by mouth 2 (two) times daily.   B Complex Vitamins (VITAMIN B COMPLEX PO) Take by mouth.   bisoprolol  (ZEBETA ) 10 MG tablet Take 2 tablets (20 mg total) by mouth daily.   bisoprolol -hydrochlorothiazide (ZIAC) 10-6.25 MG tablet    cetirizine  (ZYRTEC  ALLERGY) 10 MG tablet Take 1 tablet (10 mg total) by mouth daily.   cholecalciferol (VITAMIN D3) 25 MCG (1000 UNIT) tablet Take 1,000 Units by mouth daily.   cyanocobalamin (VITAMIN B12) 1000 MCG tablet Take 1,000 mcg by mouth.   esomeprazole  (NEXIUM ) 40 MG capsule Take 40 mg by mouth daily at 12 noon.   losartan  (COZAAR ) 50 MG tablet Take 1 tablet (50 mg total) by mouth daily.   magnesium  chloride (SLOW-MAG) 64 MG TBEC SR tablet Take 2 tablets by mouth 3 (three) times daily. New discharge medication.   methylPREDNISolone  (MEDROL  DOSEPAK) 4 MG TBPK tablet 6 day dosepack as directed   montelukast (SINGULAIR) 10 MG tablet Take 10 mg by mouth daily.   potassium chloride  SA (KLOR-CON  M) 20 MEQ tablet Take 1 tablet (20 mEq total) by mouth daily.   Tiotropium Bromide (SPIRIVA RESPIMAT) 2.5 MCG/ACT AERS Inhale 2.5 mcg into the lungs in the morning. 2 puffs inhaled every morning.   amitriptyline  (ELAVIL ) 25 MG tablet Take 1 tablet (25 mg total) by mouth at bedtime. (Patient not taking: Reported on 02/08/2024)   apixaban  (ELIQUIS ) 5 MG TABS tablet Take 1 tablet (5 mg total) by mouth 2 (two) times daily. (Patient not taking: Reported on 02/08/2024)   budesonide -formoterol  (SYMBICORT ) 160-4.5 MCG/ACT inhaler Inhale into the lungs. (Patient not taking: Reported on 02/08/2024)   fluconazole  (DIFLUCAN ) 150 MG tablet Take 1 tablet (150 mg total) by mouth daily. (Patient not taking: Reported on 02/08/2024)   Fluticasone -Umeclidin-Vilant (TRELEGY ELLIPTA ) 100-62.5-25 MCG/ACT AEPB Take 1 Inhalation by  mouth daily. (Patient not taking: Reported on 02/08/2024)   gabapentin  (NEURONTIN ) 300 MG capsule Take 1 capsule (300 mg total) by mouth at bedtime. (Patient taking differently: Take 300 mg by mouth at bedtime as needed.)   ketoconazole (NIZORAL) 2 % shampoo SHAMPOO TOPICALLY ONCE DAILY AS NEEDED (Patient not taking: Reported on 02/08/2024)   magnesium  chloride (SLOW-MAG) 64 MG TBEC SR tablet Take 143 mg by mouth. (Patient not taking: Reported on 02/08/2024)   MAGNESIUM -OXIDE 400 (240 Mg) MG tablet Take 1 tablet (400 mg total) by mouth daily. (Patient not taking: Reported on 02/08/2024)   ondansetron  (ZOFRAN ) 4 MG tablet Take 1 tablet (4 mg total) by mouth every 8 (eight) hours as needed for nausea or vomiting. (Patient not taking: Reported on 02/08/2024)   predniSONE  (STERAPRED UNI-PAK 21 TAB) 5 MG (21) TBPK tablet Take by mouth. (Patient taking differently: Take by mouth. Patient reported she never finished dose pack and resumed on discharge of 02/05/24, Needs review by PCP.)   promethazine -dextromethorphan (PROMETHAZINE -DM) 6.25-15 MG/5ML syrup Take 5 mLs by mouth 4 (four)  times daily as needed. (Patient not taking: Reported on 02/08/2024)   rosuvastatin  (CRESTOR ) 20 MG tablet Take 1 tablet (20 mg total) by mouth daily. (Patient not taking: Reported on 02/08/2024)   rosuvastatin  (CRESTOR ) 40 MG tablet Take 40 mg by mouth daily. (Patient not taking: Reported on 02/08/2024)   [DISCONTINUED] cefdinir  (OMNICEF ) 300 MG capsule Take 1 capsule (300 mg total) by mouth 2 (two) times daily. (Patient taking differently: Take 300 mg by mouth 2 (two) times daily. Take for 4 days per 02/05/24 discharge and patient review.)   [DISCONTINUED] doxycycline  (MONODOX ) 100 MG capsule Take 100 mg by mouth every 12 (twelve) hours.   No facility-administered encounter medications on file as of 02/21/2024.    Social History[1]    Review of Systems  Constitutional:  Negative for diaphoresis, fever, malaise/fatigue and  weight loss.  Respiratory:  Positive for cough. Negative for shortness of breath and wheezing.   Cardiovascular:  Negative for chest pain, palpitations, orthopnea, claudication, leg swelling and PND.      Objective:     BP 116/74 (BP Location: Right Arm, Patient Position: Sitting, Cuff Size: Normal)   Pulse 69   Temp 98.5 F (36.9 C) (Oral)   Resp 17   Wt 162 lb 3.2 oz (73.6 kg)   SpO2 96%   BMI 28.74 kg/m    Physical Exam Constitutional:      General: She is not in acute distress.    Appearance: Normal appearance.  HENT:     Head: Normocephalic.     Nose: Congestion present. No rhinorrhea.  Cardiovascular:     Rate and Rhythm: Normal rate and regular rhythm.     Pulses: Normal pulses.     Heart sounds: Normal heart sounds.  Pulmonary:     Effort: Pulmonary effort is normal.     Breath sounds: Normal breath sounds.  Abdominal:     General: Bowel sounds are normal.     Palpations: Abdomen is soft.  Musculoskeletal:     Cervical back: Neck supple. No tenderness.     Right lower leg: No edema.     Left lower leg: No edema.     Comments: Mild to moderate Left shoulder pain.  Fairly good ROM.  Only minor Left neck and arm pain.  Neurological:     General: No focal deficit present.     Mental Status: She is alert.      No results found for any visits on 02/21/24.    The ASCVD Risk score (Arnett DK, et al., 2019) failed to calculate for the following reasons:   Risk score cannot be calculated because patient has a medical history suggesting prior/existing ASCVD   * - Cholesterol units were assumed    Assessment & Plan:   Assessment & Plan Chronic left shoulder pain Likely due to Cervical nerve impingement, but will obtain plain films as a precaution.  She is on a tight budget and declines PT for now. Orders:   DG Shoulder Left  Cigarette smoker Continues to smoke approximately 10 cigarettes per day. Acknowledges difficulty in quitting despite attempts to  reduce smoking. Smoking may contribute to sinusitis and other health issues.    Hypokalemia Previous hospitalization for vitamin deficiencies. Currently on supplements but reports difficulty with medication adherence due to financial constraints. - Repeated labs to assess current vitamin levels Orders:   Comprehensive metabolic panel with GFR  Anemia, unspecified type She notes she plans to have a repeat Colonoscopy in the coming months. Orders:  CBC with Differential/Platelet  Hypomagnesemia  Orders:   Magnesium   Chronic maxillary sinusitis Persistent symptoms of burning and clogged head despite previous antibiotic treatment. Symptoms suggest chronicity and possible non-bacterial causes such as allergies or smoking. - Prescribed another round of antibiotics - Recommended over-the-counter probiotics to reduce risk of antibiotic-associated diarrhea - Referred to ENT specialist for further evaluation Orders:   Ambulatory referral to ENT   amoxicillin -clavulanate (AUGMENTIN ) 875-125 MG tablet; Take 1 tablet by mouth 2 (two) times daily.  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  Pain radiating from shoulder to arm, likely due to a pinched nerve. No recent imaging or assessment. Symptoms suggest cervical radiculopathy rather than shoulder pathology. - Prescribed a short course of prednisone  for inflammation and pain relief Orders:   methylPREDNISolone  (MEDROL  DOSEPAK) 4 MG TBPK tablet; 6 day dosepack as directed     Return in about 3 months (around 05/21/2024) for chronic follow-up.    REDDING PONCE NORLEEN FALCON., MD    [1]  Social History Tobacco Use   Smoking status: Every Day    Current packs/day: 0.50    Average packs/day: 0.9 packs/day for 89.0 years (84.5 ttl pk-yrs)    Types: Cigarettes    Start date: 89   Smokeless tobacco: Never  Vaping Use   Vaping status: Never Used  Substance Use Topics   Alcohol use: Not Currently    Comment: Recovering alcoholic   Drug  use: No   "

## 2024-02-21 NOTE — Assessment & Plan Note (Signed)
 Previous hospitalization for vitamin deficiencies. Currently on supplements but reports difficulty with medication adherence due to financial constraints. - Repeated labs to assess current vitamin levels Orders:   Comprehensive metabolic panel with GFR

## 2024-02-22 ENCOUNTER — Telehealth (HOSPITAL_BASED_OUTPATIENT_CLINIC_OR_DEPARTMENT_OTHER): Payer: Self-pay | Admitting: *Deleted

## 2024-02-22 ENCOUNTER — Ambulatory Visit (HOSPITAL_BASED_OUTPATIENT_CLINIC_OR_DEPARTMENT_OTHER): Payer: Self-pay | Admitting: Family Medicine

## 2024-02-22 LAB — CBC WITH DIFFERENTIAL/PLATELET
Basophils Absolute: 0 x10E3/uL (ref 0.0–0.2)
Basos: 1 %
EOS (ABSOLUTE): 0.1 x10E3/uL (ref 0.0–0.4)
Eos: 2 %
Hematocrit: 37.5 % (ref 34.0–46.6)
Hemoglobin: 11 g/dL — ABNORMAL LOW (ref 11.1–15.9)
Immature Grans (Abs): 0 x10E3/uL (ref 0.0–0.1)
Immature Granulocytes: 0 %
Lymphocytes Absolute: 2.3 x10E3/uL (ref 0.7–3.1)
Lymphs: 39 %
MCH: 25.1 pg — ABNORMAL LOW (ref 26.6–33.0)
MCHC: 29.3 g/dL — ABNORMAL LOW (ref 31.5–35.7)
MCV: 86 fL (ref 79–97)
Monocytes Absolute: 0.6 x10E3/uL (ref 0.1–0.9)
Monocytes: 10 %
Neutrophils Absolute: 2.8 x10E3/uL (ref 1.4–7.0)
Neutrophils: 48 %
Platelets: 469 x10E3/uL — ABNORMAL HIGH (ref 150–450)
RBC: 4.38 x10E6/uL (ref 3.77–5.28)
RDW: 19.1 % — ABNORMAL HIGH (ref 11.7–15.4)
WBC: 5.9 x10E3/uL (ref 3.4–10.8)

## 2024-02-22 LAB — COMPREHENSIVE METABOLIC PANEL WITH GFR
ALT: 16 IU/L (ref 0–32)
AST: 20 IU/L (ref 0–40)
Albumin: 4.3 g/dL (ref 3.8–4.9)
Alkaline Phosphatase: 100 IU/L (ref 49–135)
BUN/Creatinine Ratio: 18 (ref 12–28)
BUN: 17 mg/dL (ref 8–27)
Bilirubin Total: 0.3 mg/dL (ref 0.0–1.2)
CO2: 17 mmol/L — ABNORMAL LOW (ref 20–29)
Calcium: 9.9 mg/dL (ref 8.7–10.3)
Chloride: 102 mmol/L (ref 96–106)
Creatinine, Ser: 0.97 mg/dL (ref 0.57–1.00)
Globulin, Total: 2.6 g/dL (ref 1.5–4.5)
Glucose: 101 mg/dL — ABNORMAL HIGH (ref 70–99)
Potassium: 5.4 mmol/L — ABNORMAL HIGH (ref 3.5–5.2)
Sodium: 136 mmol/L (ref 134–144)
Total Protein: 6.9 g/dL (ref 6.0–8.5)
eGFR: 67 mL/min/1.73

## 2024-02-22 LAB — MAGNESIUM: Magnesium: 1.7 mg/dL (ref 1.6–2.3)

## 2024-02-22 NOTE — Progress Notes (Signed)
 Remote Loop Recorder Transmission

## 2024-02-22 NOTE — Telephone Encounter (Signed)
 LMOM

## 2024-02-23 ENCOUNTER — Other Ambulatory Visit: Payer: Self-pay

## 2024-02-23 NOTE — Patient Instructions (Signed)
 Visit Information  Thank you for taking time to visit with me today. Please don't hesitate to contact me if I can be of assistance to you before our next scheduled telephone appointment.  Our next appointment is by telephone on 03/01/2024 at 3 pm  Following is a copy of your care plan:   Goals Addressed             This Visit's Progress    VBCI Transitions of Care (TOC) Care Plan   On track    ALL Reviewed and/or updated with patient on 02/23/2024 telephone call.   Problems:  Recent Hospitalization for treatment of electrolyte abnormalities and COPD exacerbation Knowledge Deficit Related to electrolyte abnormalities, and No Hospital Follow Up Provider appointment for HFU.   Goal:  Over the next 30 days, the patient will not experience hospital readmission  Interventions:  Transitions of Care: Vaccines  Doctor Visits  - discussed the importance of doctor visits Post discharge activity limitations prescribed by provider reviewed PCP HFU completed on 02/21/2024 per patient stated and notes in EMR.  Referral to ENT for Chronic sinus issues.  Reviewed upcoming colonoscopy Patient pending hearing from consult, encouraged to have PCP send another referral  Medication review completed from outside medication reconciliation. Allergies reviewed; No changes SDOH needs reviewed, may need referral later.   Patient Self Care Activities:  Attend all scheduled provider appointments Attend church or other social activities Call pharmacy for medication refills 3-7 days in advance of running out of medications Notify RN Care Manager of TOC call rescheduling needs Participate in Transition of Care Program/Attend TOC scheduled calls Perform all self care activities independently  Perform IADL's (shopping, preparing meals, housekeeping, managing finances) independently Take medications as prescribed   Start taking blood pressure 3 x/week and keeping a log to take to providers. Contact  counselor/therapist to start treatment if desired as discussed previously. Declined ambulatory referral at this time.  Re-Contact Medicaid Case Manager for any additional resources and has contact information.   Plan:  Telephone follow up appointment with care management team member scheduled for:  02/23/23 3 pm The patient will call PCP as advised to schedule HFU as discussed/rationale explained. .   Patient understands discharge instructions and what to call provider for if condition worsens.   Patient has specialist follow up appointments for colonoscopy and EGD and new ENT referral from PCP>  The patient has been provided with contact information for the care management team and has been advised to call with any health-related questions or concerns.  The patient verbalized understanding with current POC and to contact PCP or specialist for any urgent questions or concerns.  The patient is again directed to their insurance card regarding availability of benefits coverage and has Medicaid contact number.  No changes to SDOH, patient reported they understood discharge instructions, medication review completed, medications obtained and understood what they are for and when to take, what to call provider for, no issues reported on assessment items, and no red flags noted on this call unless documented under assessment.     Telephone follow up appointment with care management team member scheduled for:  03/01/2024 at 3 pm.         Patient verbalizes understanding of instructions and care plan provided today and agrees to view in MyChart. Active MyChart status and patient understanding of how to access instructions and care plan via MyChart confirmed with patient.     Telephone follow up appointment with care management team member scheduled  for:  Telephone follow up appointment with care management team member scheduled for:  03/01/2024 at 3 pm.  Please call the care guide team at [phone]  if you need to cancel or reschedule your appointment.   Please call the [place]  National Suicide Prevention Lifeline: 818-105-9857 or TTY: 332-695-4481 TTY 228 353 7901) to talk to a trained counselor call 1-800-273-TALK (toll free, 24 hour hotline) if you are experiencing a Mental Health or Behavioral Health Crisis or need someone to talk to.   Bing Edison MSN, RN RN [employer]  [facility]-Population Health Office Hours M-F [phone] Direct Dial: [phone] Main Phone [phone]  Fax: [phone] [email or web address]

## 2024-02-23 NOTE — Telephone Encounter (Signed)
 Mary Franco was in [facility] from 02/01/24-02/05/24 for electrolyte abnormality.  Saw PCP on 02/21/2024, office note in care everywhere. Also followed up with Cardiology on 02/17/24.  Last seen in nephrology on 12/22/23. Follow up scheduled for 05/03/24.

## 2024-02-23 NOTE — Transitions of Care (Post Inpatient/ED Visit) (Signed)
 " Transition of Care week 2  Visit Note  02/23/2024  Name: Mary Franco MRN: [id number]          DOB: 07-Jun-1965  Situation: Patient enrolled in [facility] 30-day program. Visit completed with patient by telephone.   Background:   Initial Transition Care Management Follow-up Telephone Call Discharge Date and Diagnosis: 02/05/24, Electrolyte abnormality   Past Medical History:  Diagnosis Date   Anxiety    f/by her GYN   Aortic atherosclerosis 08/04/2022   ASD (atrial septal defect) 07/19/2018   Asthma    Chronic allergic rhinitis 08/04/2022   Congestive heart failure (HCC) 02/15/2018   COPD (chronic obstructive pulmonary disease) (HCC)    f/by Pulmonology in [facility]   Essential hypertension    GERD (gastroesophageal reflux disease)    Hyperlipidemia    Irritable bowel syndrome 06/11/2022   Major depressive disorder 09/15/2011   Migraines    f/by Neurology   NICM (nonischemic cardiomyopathy) (HCC) 03/25/2018   Ovarian failure    Sees GYN in [place]   Paroxysmal atrial fibrillation (HCC) 12/16/2022   f/by Dr. Edwyna   Pernicious anemia    PFO (patent foramen ovale) 08/04/2022   Thromboembolic stroke (HCC) 02/11/2022   f/by Neurology   Tobacco abuse    Unable to quit   Vitamin D  deficiency     Assessment: Patient Reported Symptoms: Cognitive Cognitive Status: Alert and oriented to person, place, and time, Normal speech and language skills, Other: (Tends to have trouble staying on topic.) Cognitive/Intellectual Conditions Management [RPT]: Other Other: S/p CVA in the past.      Neurological Neurological Review of Symptoms: No symptoms reported    HEENT HEENT Symptoms Reported: Other:      Cardiovascular Cardiovascular Symptoms Reported: No symptoms reported Does patient have uncontrolled Hypertension?: No Cardiovascular Comment: Denies chest pain or discomfort. Blood pressure reported as 127/54 by patient today.  Respiratory Respiratory Symptoms Reported: No  symptoms reported    Endocrine Endocrine Symptoms Reported: No symptoms reported Is patient diabetic?: No    Gastrointestinal Gastrointestinal Symptoms Reported: Diarrhea Additional Gastrointestinal Details: Pending a colonsocpy and EGD per referral for intermittent diarrhea. Gastrointestinal Management Strategies: Coping strategies, Medication therapy Gastrointestinal Comment: Pending a colonsocpy and EGD per referral for intermittent diarrhea. Use OTC immodium or generic.    Genitourinary Genitourinary Symptoms Reported: No symptoms reported    Integumentary      Musculoskeletal Musculoskelatal Symptoms Reviewed: Joint pain Additional Musculoskeletal Details: Left shoulder, X-rayed by PCP on 02/21/24 pending results. Hurts when lifts arm and chronic. Musculoskeletal Management Strategies: Routine screening, Coping strategies   Fall risk Follow up: Falls prevention discussed  Psychosocial Psychosocial Symptoms Reported: Sadness - if selected complete PHQ 2-9 Behavioral Management Strategies: Abstinence from substances, Adequate rest, Coping strategies, Support system, Medication therapy Major Change/Loss/Stressor/Fears (CP): Medical condition, self, Relationship concerns Techniques to Cope with Loss/Stress/Change: Counseling Quality of Family Relationships: supportive Do you feel physically threatened by others?: No   Today's Vitals   02/23/24 1617  BP: (!) 127/54   Pain Scale: 0-10 Pain Score: 4  Pain Type: Chronic pain Pain Location: Shoulder Pain Orientation: Left Pain Descriptors / Indicators: Throbbing, Aching Pain Onset: On-going Pain Intervention(s):  (PCP completed xrays while at 02/21/2024 appointment. Hurts when raising arm up.)  Medications Reviewed Today     Reviewed by Carolee Heron NOVAK, RN (Case Manager) on 02/23/24 at 1601  Med List Status: <None>   Medication Order Taking? Sig Documenting Provider Last Dose Status Informant  acetaminophen  (TYLENOL ) 500  MG tablet  [id number] Yes Take 1,000 mg by mouth every 6 (six) hours as needed for moderate pain. [provider]  Active Self  albuterol  (PROVENTIL ) (2.5 MG/3ML) 0.083% nebulizer solution [id number] Yes Take 3 mLs (2.5 mg total) by nebulization every 4 (four) hours as needed for wheezing or shortness of breath (Use instead of the MDI if needed.). [name], MD  Active   albuterol  (VENTOLIN  HFA) 108 [phone] Base) MCG/ACT inhaler [id number] Yes Inhale 2 puffs into the lungs every 4 (four) hours as needed for wheezing or shortness of breath. [name], MD  Active   alprazolam  (XANAX ) 2 MG tablet [id number] Yes Take 2 mg by mouth at bedtime. [provider]  Active Self           Med Note STEPHEN, MICHELE   Fri Mar 25, 2018  1:13 PM)    amitriptyline  (ELAVIL ) 25 MG tablet [id number]  Take 1 tablet (25 mg total) by mouth at bedtime.  Patient not taking: Reported on 02/23/2024   Whitfield Raisin, NP  Active   amoxicillin -clavulanate (AUGMENTIN ) 875-125 MG tablet [id number] Yes Take 1 tablet by mouth 2 (two) times daily. [name], MD  Active   apixaban  (ELIQUIS ) 5 MG TABS tablet [id number] Yes Take 1 tablet (5 mg total) by mouth 2 (two) times daily. Nicholaus Credit, PA-C  Active   B Complex Vitamins (VITAMIN B COMPLEX PO) [id number] Yes Take by mouth. [provider]  Active   bisoprolol  (ZEBETA ) 10 MG tablet [id number] Yes Take 2 tablets (20 mg total) by mouth daily. Nicholaus Credit, PA-C  Active   budesonide -formoterol  (SYMBICORT ) 160-4.5 MCG/ACT inhaler [id number]  Inhale into the lungs.  Patient not taking: Reported on 02/23/2024   [provider]  Active   cetirizine  (ZYRTEC  ALLERGY) 10 MG tablet [id number] Yes Take 1 tablet (10 mg total) by mouth daily. Nicholaus Credit, PA-C  Active   cholecalciferol (VITAMIN D3) 25 MCG (1000 UNIT) tablet [id number] Yes Take 1,000 Units by mouth daily. [provider]  Active   cyanocobalamin (VITAMIN B12) 1000 MCG tablet [id number] Yes  Take 1,000 mcg by mouth. [provider]  Active   esomeprazole  (NEXIUM ) 40 MG capsule [id number] Yes Take 40 mg by mouth daily at 12 noon. [provider]  Active   fluconazole  (DIFLUCAN ) 150 MG tablet [id number]  Take 1 tablet (150 mg total) by mouth daily.  Patient not taking: Reported on 02/23/2024   Randol Simmonds, MD  Active   Fluticasone -Umeclidin-Vilant (TRELEGY ELLIPTA ) 100-62.5-25 MCG/ACT AEPB [id number]  Take 1 Inhalation by mouth daily.  Patient not taking: Reported on 02/23/2024   [name], MD  Active   gabapentin  (NEURONTIN ) 300 MG capsule [id number] Yes Take 1 capsule (300 mg total) by mouth at bedtime.  Patient taking differently: Take 300 mg by mouth at bedtime as needed.   Nicholaus Credit, PA-C  Active   ketoconazole (NIZORAL) 2 % shampoo [id number]  SHAMPOO TOPICALLY ONCE DAILY AS NEEDED  Patient not taking: Reported on 02/23/2024   [provider]  Active   losartan  (COZAAR ) 50 MG tablet [id number] Yes Take 1 tablet (50 mg total) by mouth daily. Revankar, Jennifer SAUNDERS, MD  Active   magnesium  chloride (SLOW-MAG) 64 MG TBEC SR tablet [id number]  Take 143 mg by mouth.  Patient not taking: Reported on 02/08/2024   [provider]  Active   magnesium  chloride (SLOW-MAG) 64 MG TBEC SR tablet [id number] Yes Take 2 tablets by  mouth 3 (three) times daily. New discharge medication. [provider]  Active   MAGNESIUM -OXIDE 400 (240 Mg) MG tablet [id number]  Take 1 tablet (400 mg total) by mouth daily.  Patient not taking: Reported on 02/23/2024   Nicholaus Credit, PA-C  Active   methylPREDNISolone  (MEDROL  DOSEPAK) 4 MG TBPK tablet [id number] Yes 6 day dosepack as directed [name], MD  Active   montelukast (SINGULAIR) 10 MG tablet [id number] Yes Take 10 mg by mouth daily. [provider]  Active   ondansetron  (ZOFRAN ) 4 MG tablet [id number]  Take 1 tablet (4 mg total) by mouth every 8 (eight) hours as needed for nausea or vomiting.  Patient not  taking: Reported on 02/23/2024   Charlene Clotilda PARAS, NP  Active Self  potassium chloride  SA (KLOR-CON  M) 20 MEQ tablet [id number]  Take 1 tablet (20 mEq total) by mouth daily.  Patient not taking: Reported on 02/23/2024   Nicholaus Credit, PA-C  Active   predniSONE  (STERAPRED UNI-PAK 21 TAB) 5 MG (21) TBPK tablet [id number]  Take by mouth.  Patient not taking: Reported on 02/23/2024   [provider]  Active   promethazine -dextromethorphan (PROMETHAZINE -DM) 6.25-15 MG/5ML syrup [id number]  Take 5 mLs by mouth 4 (four) times daily as needed.  Patient not taking: Reported on 02/23/2024   Nicholaus Credit, PA-C  Active   rosuvastatin  (CRESTOR ) 20 MG tablet [id number]  Take 1 tablet (20 mg total) by mouth daily.  Patient not taking: Reported on 02/23/2024   Nicholaus Credit, PA-C  Active   rosuvastatin  (CRESTOR ) 40 MG tablet [id number]  Take 40 mg by mouth daily.  Patient not taking: Reported on 02/23/2024   [provider]  Active   Tiotropium Bromide (SPIRIVA RESPIMAT) 2.5 MCG/ACT AERS [id number] Yes Inhale 2.5 mcg into the lungs in the morning. 2 puffs inhaled every morning. [provider]  Active           The patient is directed to their insurance card regarding availability of benefits coverage and has Medicaid CM contact number from recent contact.  No changes to SDOH, patient reported they understood discharge instructions, medication review completed, medications obtained and understood what they are for and when to take, what to call provider for, no issues reported on assessment items, and no red flags noted on this call unless documented under assessment.     Recommendation:   Continue Current Plan of Care  Follow Up Plan:   Telephone follow-up in 1 week on 03/01/2024 at 3 pm   Bing Edison MSN, RN RN Case Manager [facility]  [facility] Office Hours M-F [phone] Direct Dial: [phone] Main Phone [phone]  Fax: [phone] [email or web address]       "

## 2024-02-25 ENCOUNTER — Ambulatory Visit (HOSPITAL_BASED_OUTPATIENT_CLINIC_OR_DEPARTMENT_OTHER)

## 2024-02-25 DIAGNOSIS — R7303 Prediabetes: Secondary | ICD-10-CM

## 2024-02-25 LAB — BASIC METABOLIC PANEL WITH GFR
BUN/Creatinine Ratio: 18 (ref 12–28)
BUN: 19 mg/dL (ref 8–27)
CO2: 22 mmol/L (ref 20–29)
Calcium: 9.7 mg/dL (ref 8.7–10.3)
Chloride: 103 mmol/L (ref 96–106)
Creatinine, Ser: 1.06 mg/dL — ABNORMAL HIGH (ref 0.57–1.00)
Glucose: 115 mg/dL — ABNORMAL HIGH (ref 70–99)
Potassium: 5 mmol/L (ref 3.5–5.2)
Sodium: 138 mmol/L (ref 134–144)
eGFR: 60 mL/min/1.73

## 2024-02-29 ENCOUNTER — Telehealth (HOSPITAL_BASED_OUTPATIENT_CLINIC_OR_DEPARTMENT_OTHER): Payer: Self-pay | Admitting: Family Medicine

## 2024-02-29 NOTE — Telephone Encounter (Signed)
 Copied from CRM #[id number]. Topic: Clinical - Lab/Test Results >> Feb 29, 2024 11:13 AM Willma SAUNDERS wrote: Reason for CRM: Patient calling for lab results from 01/09, is requesting a call once reviewed.  Patient can be reached at [phone], ok to leave VM

## 2024-03-01 ENCOUNTER — Telehealth: Payer: Self-pay

## 2024-03-14 DIAGNOSIS — D510 Vitamin B12 deficiency anemia due to intrinsic factor deficiency: Secondary | ICD-10-CM | POA: Insufficient documentation

## 2024-03-14 DIAGNOSIS — E2839 Other primary ovarian failure: Secondary | ICD-10-CM | POA: Insufficient documentation

## 2024-03-15 ENCOUNTER — Telehealth: Payer: Self-pay | Admitting: Adult Health

## 2024-03-15 NOTE — Telephone Encounter (Signed)
 MYC cxl

## 2024-03-17 ENCOUNTER — Encounter (HOSPITAL_BASED_OUTPATIENT_CLINIC_OR_DEPARTMENT_OTHER): Payer: Self-pay

## 2024-03-18 ENCOUNTER — Ambulatory Visit

## 2024-03-19 ENCOUNTER — Ambulatory Visit

## 2024-03-19 DIAGNOSIS — I639 Cerebral infarction, unspecified: Secondary | ICD-10-CM

## 2024-03-20 ENCOUNTER — Encounter

## 2024-03-21 ENCOUNTER — Encounter (HOSPITAL_BASED_OUTPATIENT_CLINIC_OR_DEPARTMENT_OTHER): Payer: Self-pay

## 2024-03-21 LAB — CUP PACEART REMOTE DEVICE CHECK
Date Time Interrogation Session: 20260131232010
Implantable Pulse Generator Implant Date: 20240610

## 2024-03-22 ENCOUNTER — Ambulatory Visit: Admitting: Cardiology

## 2024-03-23 ENCOUNTER — Ambulatory Visit: Admitting: Cardiology

## 2024-03-23 ENCOUNTER — Encounter: Payer: Self-pay | Admitting: Cardiology

## 2024-03-23 VITALS — BP 128/86 | HR 73 | Ht 63.0 in | Wt 164.0 lb

## 2024-03-23 DIAGNOSIS — R5383 Other fatigue: Secondary | ICD-10-CM | POA: Diagnosis not present

## 2024-03-23 DIAGNOSIS — Q211 Atrial septal defect, unspecified: Secondary | ICD-10-CM | POA: Diagnosis not present

## 2024-03-23 DIAGNOSIS — R011 Cardiac murmur, unspecified: Secondary | ICD-10-CM | POA: Diagnosis not present

## 2024-03-23 DIAGNOSIS — I428 Other cardiomyopathies: Secondary | ICD-10-CM

## 2024-03-23 DIAGNOSIS — I480 Paroxysmal atrial fibrillation: Secondary | ICD-10-CM | POA: Diagnosis not present

## 2024-03-23 DIAGNOSIS — Z79899 Other long term (current) drug therapy: Secondary | ICD-10-CM | POA: Diagnosis not present

## 2024-03-23 DIAGNOSIS — I700 Atherosclerosis of aorta: Secondary | ICD-10-CM | POA: Diagnosis not present

## 2024-03-23 DIAGNOSIS — I5042 Chronic combined systolic (congestive) and diastolic (congestive) heart failure: Secondary | ICD-10-CM | POA: Diagnosis not present

## 2024-03-23 DIAGNOSIS — I10 Essential (primary) hypertension: Secondary | ICD-10-CM | POA: Diagnosis not present

## 2024-03-23 MED ORDER — LOSARTAN POTASSIUM 50 MG PO TABS: 50.0000 mg | ORAL_TABLET | Freq: Every day | ORAL | 0 refills | Status: AC

## 2024-03-23 MED ORDER — ROSUVASTATIN CALCIUM 20 MG PO TABS: 20.0000 mg | ORAL_TABLET | Freq: Every day | ORAL | Status: AC

## 2024-03-23 NOTE — Patient Instructions (Signed)
 Medication Instructions:  Your physician recommends that you continue on your current medications as directed. Please refer to the Current Medication list given to you today.  *If you need a refill on your cardiac medications before your next appointment, please call your pharmacy*   Lab Work: None ordered If you have labs (blood work) drawn today and your tests are completely normal, you will receive your results only by: MyChart Message (if you have MyChart) OR A paper copy in the mail If you have any lab test that is abnormal or we need to change your treatment, we will call you to review the results.  Testing/Procedures: Your physician has requested that you have an echocardiogram. Echocardiography is a painless test that uses sound waves to create images of your heart. It provides your doctor with information about the size and shape of your heart and how well your heart's chambers and valves are working. This procedure takes approximately one hour. There are no restrictions for this procedure. Please do NOT wear cologne, perfume, aftershave, or lotions (deodorant is allowed). Please arrive 15 minutes prior to your appointment time.  Please note: We ask at that you not bring children with you during ultrasound (echo/ vascular) testing. Due to room size and safety concerns, children are not allowed in the ultrasound rooms during exams. Our front office staff cannot provide observation of children in our lobby area while testing is being conducted. An adult accompanying a patient to their appointment will only be allowed in the ultrasound room at the discretion of the ultrasound technician under special circumstances. We apologize for any inconvenience.  Follow-Up: At [facility], you and your health needs are our priority.  As part of our continuing mission to provide you with exceptional heart care, we have created designated Provider Care Teams.  These Care Teams include your primary  Cardiologist (physician) and Advanced Practice Providers (APPs -  Physician Assistants and Nurse Practitioners) who all work together to provide you with the care you need, when you need it.  We recommend signing up for the patient portal called MyChart.  Sign up information is provided on this After Visit Summary.  MyChart is used to connect with patients for Virtual Visits (Telemedicine).  Patients are able to view lab/test results, encounter notes, upcoming appointments, etc.  Non-urgent messages can be sent to your provider as well.   To learn more about what you can do with MyChart, go to [email or web address].    Your next appointment:   9 month(s)  The format for your next appointment:   In Person  Provider:   Jennifer Crape, MD   Other Instructions Echocardiogram An echocardiogram is a test that uses sound waves (ultrasound) to produce images of the heart. Images from an echocardiogram can provide important information about: Heart size and shape. The size and thickness and movement of your heart's walls. Heart muscle function and strength. Heart valve function or if you have stenosis. Stenosis is when the heart valves are too narrow. If blood is flowing backward through the heart valves (regurgitation). A tumor or infectious growth around the heart valves. Areas of heart muscle that are not working well because of poor blood flow or injury from a heart attack. Aneurysm detection. An aneurysm is a weak or damaged part of an artery wall. The wall bulges out from the normal force of blood pumping through the body. Tell a health care provider about: Any allergies you have. All medicines you are taking, including vitamins, herbs,  eye drops, creams, and over-the-counter medicines. Any blood disorders you have. Any surgeries you have had. Any medical conditions you have. Whether you are pregnant or may be pregnant. What are the risks? Generally, this is a safe test. However,  problems may occur, including an allergic reaction to dye (contrast) that may be used during the test. What happens before the test? No specific preparation is needed. You may eat and drink normally. What happens during the test? You will take off your clothes from the waist up and put on a hospital gown. Electrodes or electrocardiogram (ECG)patches may be placed on your chest. The electrodes or patches are then connected to a device that monitors your heart rate and rhythm. You will lie down on a table for an ultrasound exam. A gel will be applied to your chest to help sound waves pass through your skin. A handheld device, called a transducer, will be pressed against your chest and moved over your heart. The transducer produces sound waves that travel to your heart and bounce back (or echo back) to the transducer. These sound waves will be captured in real-time and changed into images of your heart that can be viewed on a video monitor. The images will be recorded on a computer and reviewed by your health care provider. You may be asked to change positions or hold your breath for a short time. This makes it easier to get different views or better views of your heart. In some cases, you may receive contrast through an IV in one of your veins. This can improve the quality of the pictures from your heart. The procedure may vary among health care providers and hospitals.   What can I expect after the test? You may return to your normal, everyday life, including diet, activities, and medicines, unless your health care provider tells you not to do that. Follow these instructions at home: It is up to you to get the results of your test. Ask your health care provider, or the department that is doing the test, when your results will be ready. Keep all follow-up visits. This is important. Summary An echocardiogram is a test that uses sound waves (ultrasound) to produce images of the heart. Images from an  echocardiogram can provide important information about the size and shape of your heart, heart muscle function, heart valve function, and other possible heart problems. You do not need to do anything to prepare before this test. You may eat and drink normally. After the echocardiogram is completed, you may return to your normal, everyday life, unless your health care provider tells you not to do that. This information is not intended to replace advice given to you by your health care provider. Make sure you discuss any questions you have with your health care provider. Document Revised: 09/26/2019 Document Reviewed: 09/26/2019 Elsevier Patient Education  2021 [employer]   Important Information About Sugar

## 2024-03-23 NOTE — Progress Notes (Signed)
 " Cardiology Office Note:    Date:  03/23/2024   ID:  GABRELLE Franco, DOB 02-Apr-1965, MRN [id number]  PCP:  [name], MD  Cardiologist:  Jennifer JONELLE Crape, MD   Referring MD: [name], MD    ASSESSMENT:    1. Aortic atherosclerosis   2. Essential hypertension   3. Medication management   4. Chronic combined systolic and diastolic CHF (congestive heart failure) (HCC)   5. ASD (atrial septal defect)   6. NICM (nonischemic cardiomyopathy) (HCC)   7. Paroxysmal atrial fibrillation (HCC)   8. Other fatigue    PLAN:    In order of problems listed above:  Coronary artery disease: Secondary prevention stressed with the patient.  Importance of compliance with diet medication stressed and patient verbalized standing.  She was advised to ambulate to the best of her ability. Cigarette smoker: I spent 5 minutes with the patient discussing solely about smoking. Smoking cessation was counseled. I suggested to the patient also different medications and pharmacological interventions. Patient is keen to try stopping on its own at this time. He will get back to me if he needs any further assistance in this matter. Essential hypertension: Blood pressure is stable and diet was emphasized. Mixed dyslipidemia: On lipid-lowering medications followed by primary care.  Will follow lipids should be LDL less than 60. Paroxysmal atrial fibrillation:I discussed with the patient atrial fibrillation, disease process. Management and therapy including rate and rhythm control, anticoagulation benefits and potential risks were discussed extensively with the patient. Patient had multiple questions which were answered to patient's satisfaction. Anemia: Followed by primary care.  She is being monitored and evaluated for this. Patient will be seen in follow-up appointment in 6 months or earlier if the patient has any concerns.    Medication Adjustments/Labs and Tests Ordered: Current medicines are  reviewed at length with the patient today.  Concerns regarding medicines are outlined above.  No orders of the defined types were placed in this encounter.  No orders of the defined types were placed in this encounter.    No chief complaint on file.    History of Present Illness:    Mary Franco is a 59 y.o. female.  Patient has past medical history of coronary atherosclerosis, essential hypertension, mixed dyslipidemia, ASD, history of stroke and paroxysmal atrial fibrillation.  She is on anticoagulation.  She denies any problems at this time and takes care of activities of daily living.  She was in the hospital.  She was found to have low hemoglobin and is being evaluated and followed by primary care for this.  She has an appointment with gastroenterology.  At the time of my evaluation, the patient is alert awake oriented and in no distress.  Unfortunately she continues to smoke.  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  After consent was obtained, using sterile technique the knee was prepped with alcohol.  Kenalog  80 mg and 5 ml plain Lidocaine  was then injected  and the needle withdrawn.  The procedure was well tolerated.       The patient is asked to    ASD (atrial septal defect) 07/19/2018   Asthma    Atypical squamous cells of undetermined significance (ASCUS) on Papanicolaou smear of cervix 12/11/2019   NEG HR HPV, 12/17/2020 NEG HR HPV     POSITIVE HR HPV 09/03/2009 NEG HR HPV,  12/17/2020 NEG HR HPV     Atypical squamous cells of undetermined significance on cytologic smear of cervix (ASC-US ) 12/12/2019   Borderline diabetes 03/04/2023   Cervical strain 04/16/2014   Cervicogenic headache 04/16/2014   Chest pain, musculoskeletal 06/26/2019   Fell end of April 2021 > neg cxr 06/26/2019 with parasthesias > try zostrix      Chronic allergic rhinitis 08/04/2022   Chronic combined systolic and diastolic CHF (congestive heart failure) (HCC) 03/25/2018   Echo 02/2018: EF 40-45 // Echo 07/2018:  EF 55-60, trivial AI, small secundum ASD (L>R shunting).     Chronic left shoulder pain 02/21/2024   Chronic pain of left knee 10/09/2021   Cigarette smoker 03/07/2018   Colonization with MRSA (methicillin resistant Staphylococcus aureus) 09/21/2022   Congestive heart failure (HCC) 02/15/2018   COPD (chronic obstructive pulmonary disease) (HCC)    f/by Pulmonology in [place]   COPD GOLD II/ active smoker  03/30/2018   Active smoker  - 03/30/2018   Walked RA  3  laps @  approx 223ft each @ avg pace  stopped due to end of study, mild sob and chest tight, no desats    - Spirometry 03/30/2018  FEV1 2.4  (94%)  Ratio 0.73 s physiologic curvature p > 4 h since last saba   - 03/30/2018    try trelegy sample to see if reduces need for saba    - 04/14/2018  After extensive coaching inhaler device,  effectiveness =    75% fr   COPD with acute exacerbation (HCC) 08/04/2022   CVA (cerebral vascular accident) (HCC) 02/11/2022   Electrolyte abnormality 02/02/2024   Elevated liver enzymes 03/18/2023   Essential hypertension    Foot pain, left 10/26/2023   GERD (gastroesophageal reflux disease)    History of CVA with residual deficit 02/13/2022   Hospital discharge follow-up 03/04/2023   Hypercalcemia 05/17/2023   Hypercoagulable state due to paroxysmal atrial fibrillation (HCC) 12/16/2022   Hyperglycemia 08/04/2022   Hyperlipidemia    Hypokalemia    Hypomagnesemia 02/11/2022   Irritable bowel  syndrome 06/11/2022   Left ear hearing loss 12/29/2018   Low grade squamous intraepithelial lesion (LGSIL) on cervicovaginal cytologic smear 09/12/2018   Major depressive disorder 09/15/2011   Migraines    f/by Neurology   Mixed dyslipidemia 02/11/2022   NICM (nonischemic cardiomyopathy) (HCC) 03/25/2018   Old complete ACL tear, left 09/21/2022   Osteoarthritis of left knee 11/25/2022   Other acute recurrent sinusitis 10/10/2021   Other fatigue 03/04/2023   Ovarian failure    Sees GYN in [place]   Palpitations 02/11/2022   Panic disorder without agoraphobia 09/15/2011   Paroxysmal atrial fibrillation (HCC) 12/16/2022   f/by Dr. Edwyna   Pernicious anemia    PFO (patent foramen ovale) 08/04/2022   Right leg weakness 02/11/2022   Sinobronchitis 09/01/2018   Onset 2005   - augmentin  x 10 days 08/31/18    - 12/09/2018 referred to ENT  Carlie eval >   - Sinus CT p course of augmentin  started 05/24/2019 if not better   - Allergy profile 05/24/19  >  Eos 0.1/  IgE  2790      Thromboembolic stroke (HCC) 02/11/2022   f/by Neurology   Tobacco abuse    Unable to quit   Vitamin B12 deficiency 02/11/2022   Vitamin D  deficiency     Past Surgical History:  Procedure Laterality Date   APPENDECTOMY     BUBBLE STUDY  02/25/2022   Procedure: BUBBLE STUDY;  Surgeon: Santo Stanly LABOR, MD;  Location: [facility] ENDOSCOPY;  Service: Cardiovascular;;   CERVIX LESION DESTRUCTION N/A    COLONOSCOPY N/A    planned 2026   KNEE SURGERY Left    RIGHT/LEFT HEART CATH AND CORONARY ANGIOGRAPHY N/A 03/08/2018   Procedure: RIGHT/LEFT HEART CATH AND CORONARY ANGIOGRAPHY;  Surgeon: Court Dorn PARAS, MD;  Location: [facility] INVASIVE CV LAB;  Service: Cardiovascular;  Laterality: N/A;   TEE WITHOUT CARDIOVERSION N/A 02/25/2022   Procedure: TRANSESOPHAGEAL ECHOCARDIOGRAM (TEE);  Surgeon: Santo Stanly LABOR, MD;  Location: [facility] ENDOSCOPY;  Service: Cardiovascular;  Laterality: N/A;   TUBAL LIGATION      Current  Medications: Active Medications[1]   Allergies:   Codeine, Sulfa antibiotics, Pholcodine, Spironolactone , and Zetia  [ezetimibe ]   Social History   Socioeconomic History   Marital status: Divorced    Spouse name: Not on file   Number of children: 2   Years of education: Not on file   Highest education level: Not on file  Occupational History   Occupation: Textiles   Occupation: Unemployed  Tobacco Use   Smoking status: Every Day    Current packs/day: 0.50    Average packs/day: 0.9 packs/day for 89.1 years (84.5 ttl pk-yrs)    Types: Cigarettes    Start date: 34   Smokeless tobacco: Never  Vaping Use   Vaping status: Never Used  Substance and Sexual Activity   Alcohol use: Not Currently    Comment: Recovering alcoholic   Drug use: No   Sexual activity: Not Currently  Other Topics Concern   Not on file  Social History Narrative   ** Merged History Encounter **   Patient is right handed.   Patient drinks 3 cups caffeine daily.   Working on 2nd shift now at her job.  08-15-14          Social Drivers of Health   Tobacco Use: High Risk (03/23/2024)   Patient History    Smoking Tobacco Use: Every Day    Smokeless Tobacco Use: Never    Passive Exposure: Not on file  Financial Resource Strain: Medium Risk (02/02/2024)   Received from [facility]   Overall Financial Resource Strain (CARDIA)    How hard is it for you to pay for the very basics like food, housing, medical care, and heating?: Somewhat hard  Food Insecurity: No Food Insecurity (02/23/2024)   Epic    Worried About Programme Researcher, Broadcasting/film/video in the Last Year: Never true    Ran Out of Food in the Last Year: Never true  Recent Concern: Food Insecurity - Medium Risk (02/02/2024)   Received from [facility]   Epic    Within the past 12 months, you worried that your food would run out before you got money to buy more: Sometimes true    Within the past 12 months, the food you bought just didn't last and you didn't have  money to get more. : Sometimes true  Transportation Needs: No Transportation Needs (02/23/2024)   Epic    Lack of Transportation (Medical): No    Lack of Transportation (Non-Medical):  No  Physical Activity: Inactive (09/21/2022)   Exercise Vital Sign    Days of Exercise per Week: 0 days    Minutes of Exercise per Session: 0 min  Stress: Stress Concern Present (09/21/2022)   [employer] of Occupational [employer] - Occupational Stress Questionnaire    Feeling of Stress : To some extent  Social Connections: Unknown (02/02/2024)   Received from [facility]   Social Connection and Isolation Panel    In a typical week, how many times do you talk on the phone with family, friends, or neighbors?: Three times a week    Frequency of Social Gatherings with Friends and Family: Not on file    Attends Religious Services: Not on file    Active Member of Clubs or Organizations: Not on file    Attends Banker Meetings: Not on file    Marital Status: Not on file  Depression (PHQ2-9): Low Risk (02/23/2024)   Depression (PHQ2-9)    PHQ-2 Score: 3  Recent Concern: Depression (PHQ2-9) - Medium Risk (02/08/2024)   Depression (PHQ2-9)    PHQ-2 Score: 5  Alcohol Screen: Low Risk (09/21/2022)   Alcohol Screen    Last Alcohol Screening Score (AUDIT): 0  Housing: Low Risk (02/23/2024)   Epic    Unable to Pay for Housing in the Last Year: No    Number of Times Moved in the Last Year: 0    Homeless in the Last Year: No  Utilities: Not At Risk (02/23/2024)   Epic    Threatened with loss of utilities: No  Health Literacy: Adequate Health Literacy (09/21/2022)   B1300 Health Literacy    Frequency of need for help with medical instructions: Never     Family History: The patient's family history includes Cancer in her mother; Colitis in her paternal aunt; Ovarian cancer in her maternal grandmother.  ROS:   Please see the history of present illness.    All other systems reviewed and are  negative.  EKGs/Labs/Other Studies Reviewed:    The following studies were reviewed today: .SABRA   I discussed my findings with the patient.   Recent Labs: 06/15/2023: TSH 1.520 02/21/2024: ALT 16; Hemoglobin 11.0; Magnesium  1.7; Platelets 469 02/25/2024: BUN 19; Creatinine, Ser 1.06; Potassium 5.0; Sodium 138  Recent Lipid Panel    Component Value Date/Time   CHOL 137 06/15/2023 1416   TRIG 146 06/15/2023 1416   HDL 34 (L) 06/15/2023 1416   CHOLHDL 4.0 06/15/2023 1416   LDLCALC 77 06/15/2023 1416    Physical Exam:    VS:  BP 128/86   Pulse 73   Ht 5' 3 (1.6 m)   Wt 164 lb (74.4 kg)   SpO2 95%   BMI 29.05 kg/m     Wt Readings from Last 3 Encounters:  03/23/24 164 lb (74.4 kg)  02/21/24 162 lb 3.2 oz (73.6 kg)  10/26/23 166 lb (75.3 kg)     GEN: Patient is in no acute distress HEENT: Normal NECK: No JVD; No carotid bruits LYMPHATICS: No lymphadenopathy CARDIAC: Hear sounds regular, 2/6 systolic murmur at the apex. RESPIRATORY:  Clear to auscultation without rales, wheezing or rhonchi  ABDOMEN: Soft, non-tender, non-distended MUSCULOSKELETAL:  No edema; No deformity  SKIN: Warm and dry NEUROLOGIC:  Alert and oriented x 3 PSYCHIATRIC:  Normal affect   Signed, Jennifer JONELLE Crape, MD  03/23/2024 11:32 AM     [facility]     [1]  Current Meds  Medication Sig  acetaminophen  (TYLENOL ) 500 MG tablet Take 1,000 mg by mouth every 6 (six) hours as needed for moderate pain.   albuterol  (PROVENTIL ) (2.5 MG/3ML) 0.083% nebulizer solution Take 3 mLs (2.5 mg total) by nebulization every 4 (four) hours as needed for wheezing or shortness of breath (Use instead of the MDI if needed.).   albuterol  (VENTOLIN  HFA) 108 (90 Base) MCG/ACT inhaler Inhale 2 puffs into the lungs every 4 (four) hours as needed for wheezing or shortness of breath.   alprazolam  (XANAX ) 2 MG tablet Take 2 mg by mouth at bedtime.   apixaban  (ELIQUIS ) 5 MG TABS tablet Take 1 tablet (5 mg  total) by mouth 2 (two) times daily.   cetirizine  (ZYRTEC  ALLERGY) 10 MG tablet Take 1 tablet (10 mg total) by mouth daily.   cholecalciferol (VITAMIN D3) 25 MCG (1000 UNIT) tablet Take 1,000 Units by mouth daily.   cyanocobalamin (VITAMIN B12) 1000 MCG tablet Take 1,000 mcg by mouth.   esomeprazole  (NEXIUM ) 40 MG capsule Take 40 mg by mouth daily at 12 noon.   gabapentin  (NEURONTIN ) 300 MG capsule Take 1 capsule (300 mg total) by mouth at bedtime.   losartan  (COZAAR ) 50 MG tablet Take 1 tablet (50 mg total) by mouth daily.   magnesium  chloride (SLOW-MAG) 64 MG TBEC SR tablet Take 2 tablets by mouth 3 (three) times daily. New discharge medication.   [DISCONTINUED] B Complex Vitamins (VITAMIN B COMPLEX PO) Take by mouth.   "

## 2024-03-23 NOTE — Addendum Note (Signed)
 Addended by: GLENFORD ALAN CROME on: 03/23/2024 01:01 PM   Modules accepted: Orders

## 2024-03-24 NOTE — Progress Notes (Signed)
 Remote Loop Recorder Transmission

## 2024-04-19 ENCOUNTER — Ambulatory Visit

## 2024-04-20 ENCOUNTER — Ambulatory Visit

## 2024-05-20 ENCOUNTER — Ambulatory Visit

## 2024-05-22 ENCOUNTER — Ambulatory Visit (HOSPITAL_BASED_OUTPATIENT_CLINIC_OR_DEPARTMENT_OTHER): Admitting: Family Medicine

## 2024-05-26 ENCOUNTER — Ambulatory Visit: Admitting: Adult Health

## 2024-06-20 ENCOUNTER — Ambulatory Visit

## 2024-07-21 ENCOUNTER — Ambulatory Visit

## 2024-08-21 ENCOUNTER — Ambulatory Visit

## 2024-09-21 ENCOUNTER — Ambulatory Visit

## 2024-10-22 ENCOUNTER — Ambulatory Visit

## 2024-11-22 ENCOUNTER — Ambulatory Visit

## 2024-12-23 ENCOUNTER — Ambulatory Visit

## 2025-01-23 ENCOUNTER — Ambulatory Visit

## 2025-02-23 ENCOUNTER — Ambulatory Visit
